# Patient Record
Sex: Female | Born: 1950 | Race: White | Hispanic: No | Marital: Married | Smoking: Former smoker
Health system: Southern US, Community
[De-identification: ages and names within clinical notes are randomized; demographics above are authoritative.]

## PROBLEM LIST (undated history)

## (undated) DIAGNOSIS — D239 Other benign neoplasm of skin, unspecified: Secondary | ICD-10-CM

## (undated) DIAGNOSIS — J189 Pneumonia, unspecified organism: Secondary | ICD-10-CM

## (undated) DIAGNOSIS — M8580 Other specified disorders of bone density and structure, unspecified site: Secondary | ICD-10-CM

## (undated) DIAGNOSIS — Z923 Personal history of irradiation: Secondary | ICD-10-CM

## (undated) DIAGNOSIS — D649 Anemia, unspecified: Secondary | ICD-10-CM

## (undated) DIAGNOSIS — R3129 Other microscopic hematuria: Secondary | ICD-10-CM

## (undated) DIAGNOSIS — E785 Hyperlipidemia, unspecified: Secondary | ICD-10-CM

## (undated) DIAGNOSIS — C50919 Malignant neoplasm of unspecified site of unspecified female breast: Secondary | ICD-10-CM

## (undated) DIAGNOSIS — D219 Benign neoplasm of connective and other soft tissue, unspecified: Secondary | ICD-10-CM

## (undated) DIAGNOSIS — G709 Myoneural disorder, unspecified: Secondary | ICD-10-CM

## (undated) DIAGNOSIS — H35341 Macular cyst, hole, or pseudohole, right eye: Secondary | ICD-10-CM

## (undated) DIAGNOSIS — K219 Gastro-esophageal reflux disease without esophagitis: Secondary | ICD-10-CM

## (undated) DIAGNOSIS — C801 Malignant (primary) neoplasm, unspecified: Secondary | ICD-10-CM

## (undated) DIAGNOSIS — R7303 Prediabetes: Secondary | ICD-10-CM

## (undated) DIAGNOSIS — H33001 Unspecified retinal detachment with retinal break, right eye: Secondary | ICD-10-CM

## (undated) DIAGNOSIS — I10 Essential (primary) hypertension: Secondary | ICD-10-CM

## (undated) DIAGNOSIS — Z9221 Personal history of antineoplastic chemotherapy: Secondary | ICD-10-CM

## (undated) DIAGNOSIS — IMO0001 Reserved for inherently not codable concepts without codable children: Secondary | ICD-10-CM

## (undated) DIAGNOSIS — IMO0002 Reserved for concepts with insufficient information to code with codable children: Secondary | ICD-10-CM

## (undated) HISTORY — DX: Other benign neoplasm of skin, unspecified: D23.9

## (undated) HISTORY — DX: Other specified disorders of bone density and structure, unspecified site: M85.80

## (undated) HISTORY — DX: Prediabetes: R73.03

## (undated) HISTORY — DX: Reserved for inherently not codable concepts without codable children: IMO0001

## (undated) HISTORY — PX: BREAST SURGERY: SHX581

## (undated) HISTORY — DX: Malignant (primary) neoplasm, unspecified: C80.1

## (undated) HISTORY — DX: Other microscopic hematuria: R31.29

## (undated) HISTORY — DX: Reserved for concepts with insufficient information to code with codable children: IMO0002

## (undated) HISTORY — PX: TONSILLECTOMY: SUR1361

## (undated) HISTORY — DX: Essential (primary) hypertension: I10

## (undated) HISTORY — PX: CATARACT EXTRACTION: SUR2

## (undated) HISTORY — DX: Malignant neoplasm of unspecified site of unspecified female breast: C50.919

## (undated) HISTORY — DX: Benign neoplasm of connective and other soft tissue, unspecified: D21.9

## (undated) HISTORY — DX: Hyperlipidemia, unspecified: E78.5

## (undated) HISTORY — PX: LIPOMA EXCISION: SHX5283

## (undated) HISTORY — PX: COLONOSCOPY: SHX174

---

## 1998-10-14 ENCOUNTER — Ambulatory Visit (HOSPITAL_COMMUNITY): Admission: RE | Admit: 1998-10-14 | Discharge: 1998-10-14 | Payer: Self-pay | Admitting: Obstetrics and Gynecology

## 1998-10-14 ENCOUNTER — Encounter: Payer: Self-pay | Admitting: Obstetrics and Gynecology

## 1998-11-09 ENCOUNTER — Other Ambulatory Visit: Admission: RE | Admit: 1998-11-09 | Discharge: 1998-11-09 | Payer: Self-pay | Admitting: Obstetrics and Gynecology

## 1999-11-12 ENCOUNTER — Ambulatory Visit (HOSPITAL_COMMUNITY): Admission: RE | Admit: 1999-11-12 | Discharge: 1999-11-12 | Payer: Self-pay | Admitting: Obstetrics and Gynecology

## 1999-11-12 ENCOUNTER — Encounter: Payer: Self-pay | Admitting: Obstetrics and Gynecology

## 1999-12-01 ENCOUNTER — Other Ambulatory Visit: Admission: RE | Admit: 1999-12-01 | Discharge: 1999-12-01 | Payer: Self-pay | Admitting: Obstetrics and Gynecology

## 2000-11-13 ENCOUNTER — Encounter: Payer: Self-pay | Admitting: Obstetrics and Gynecology

## 2000-11-13 ENCOUNTER — Ambulatory Visit (HOSPITAL_COMMUNITY): Admission: RE | Admit: 2000-11-13 | Discharge: 2000-11-13 | Payer: Self-pay | Admitting: Obstetrics and Gynecology

## 2001-01-10 ENCOUNTER — Other Ambulatory Visit: Admission: RE | Admit: 2001-01-10 | Discharge: 2001-01-10 | Payer: Self-pay | Admitting: Obstetrics and Gynecology

## 2001-11-16 ENCOUNTER — Ambulatory Visit (HOSPITAL_COMMUNITY): Admission: RE | Admit: 2001-11-16 | Discharge: 2001-11-16 | Payer: Self-pay | Admitting: Obstetrics and Gynecology

## 2001-11-16 ENCOUNTER — Encounter: Payer: Self-pay | Admitting: Obstetrics and Gynecology

## 2002-02-05 ENCOUNTER — Other Ambulatory Visit: Admission: RE | Admit: 2002-02-05 | Discharge: 2002-02-05 | Payer: Self-pay | Admitting: Obstetrics and Gynecology

## 2002-02-13 ENCOUNTER — Encounter: Payer: Self-pay | Admitting: Obstetrics and Gynecology

## 2002-02-13 ENCOUNTER — Encounter: Admission: RE | Admit: 2002-02-13 | Discharge: 2002-02-13 | Payer: Self-pay | Admitting: Obstetrics and Gynecology

## 2002-04-16 ENCOUNTER — Encounter: Payer: Self-pay | Admitting: Obstetrics and Gynecology

## 2002-04-16 ENCOUNTER — Encounter: Admission: RE | Admit: 2002-04-16 | Discharge: 2002-04-16 | Payer: Self-pay | Admitting: Obstetrics and Gynecology

## 2002-12-03 ENCOUNTER — Ambulatory Visit (HOSPITAL_COMMUNITY): Admission: RE | Admit: 2002-12-03 | Discharge: 2002-12-03 | Payer: Self-pay | Admitting: Obstetrics and Gynecology

## 2002-12-03 ENCOUNTER — Encounter: Payer: Self-pay | Admitting: Obstetrics and Gynecology

## 2003-02-25 ENCOUNTER — Other Ambulatory Visit: Admission: RE | Admit: 2003-02-25 | Discharge: 2003-02-25 | Payer: Self-pay | Admitting: Obstetrics and Gynecology

## 2003-09-19 ENCOUNTER — Encounter: Payer: Self-pay | Admitting: Obstetrics and Gynecology

## 2003-09-19 ENCOUNTER — Encounter: Admission: RE | Admit: 2003-09-19 | Discharge: 2003-09-19 | Payer: Self-pay | Admitting: Obstetrics and Gynecology

## 2004-04-14 ENCOUNTER — Other Ambulatory Visit: Admission: RE | Admit: 2004-04-14 | Discharge: 2004-04-14 | Payer: Self-pay | Admitting: Obstetrics and Gynecology

## 2004-05-18 ENCOUNTER — Encounter: Admission: RE | Admit: 2004-05-18 | Discharge: 2004-05-18 | Payer: Self-pay | Admitting: Obstetrics and Gynecology

## 2005-06-01 ENCOUNTER — Other Ambulatory Visit: Admission: RE | Admit: 2005-06-01 | Discharge: 2005-06-01 | Payer: Self-pay | Admitting: Obstetrics and Gynecology

## 2005-06-24 ENCOUNTER — Encounter: Admission: RE | Admit: 2005-06-24 | Discharge: 2005-06-24 | Payer: Self-pay | Admitting: Obstetrics and Gynecology

## 2005-11-28 DIAGNOSIS — D219 Benign neoplasm of connective and other soft tissue, unspecified: Secondary | ICD-10-CM

## 2005-11-28 HISTORY — DX: Benign neoplasm of connective and other soft tissue, unspecified: D21.9

## 2006-04-04 ENCOUNTER — Ambulatory Visit: Payer: Self-pay | Admitting: Psychology

## 2006-04-07 ENCOUNTER — Other Ambulatory Visit: Admission: RE | Admit: 2006-04-07 | Discharge: 2006-04-07 | Payer: Self-pay | Admitting: Emergency Medicine

## 2006-04-17 ENCOUNTER — Ambulatory Visit: Payer: Self-pay | Admitting: Psychology

## 2006-05-01 ENCOUNTER — Ambulatory Visit: Payer: Self-pay | Admitting: Psychology

## 2006-05-22 ENCOUNTER — Ambulatory Visit: Payer: Self-pay | Admitting: Psychology

## 2006-06-05 ENCOUNTER — Ambulatory Visit: Payer: Self-pay | Admitting: Psychology

## 2006-07-03 ENCOUNTER — Ambulatory Visit: Payer: Self-pay | Admitting: Psychology

## 2006-07-11 ENCOUNTER — Encounter: Admission: RE | Admit: 2006-07-11 | Discharge: 2006-07-11 | Payer: Self-pay | Admitting: Obstetrics and Gynecology

## 2006-07-17 ENCOUNTER — Ambulatory Visit: Payer: Self-pay | Admitting: Psychology

## 2006-08-14 ENCOUNTER — Ambulatory Visit: Payer: Self-pay | Admitting: Psychology

## 2006-08-28 ENCOUNTER — Ambulatory Visit: Payer: Self-pay | Admitting: Psychology

## 2006-08-30 ENCOUNTER — Other Ambulatory Visit: Admission: RE | Admit: 2006-08-30 | Discharge: 2006-08-30 | Payer: Self-pay | Admitting: Obstetrics and Gynecology

## 2006-09-25 ENCOUNTER — Ambulatory Visit: Payer: Self-pay | Admitting: Psychology

## 2007-07-17 ENCOUNTER — Encounter: Admission: RE | Admit: 2007-07-17 | Discharge: 2007-07-17 | Payer: Self-pay | Admitting: Obstetrics and Gynecology

## 2007-09-04 ENCOUNTER — Other Ambulatory Visit: Admission: RE | Admit: 2007-09-04 | Discharge: 2007-09-04 | Payer: Self-pay | Admitting: Obstetrics and Gynecology

## 2008-07-21 ENCOUNTER — Encounter: Admission: RE | Admit: 2008-07-21 | Discharge: 2008-07-21 | Payer: Self-pay | Admitting: Obstetrics and Gynecology

## 2008-07-25 ENCOUNTER — Encounter: Admission: RE | Admit: 2008-07-25 | Discharge: 2008-07-25 | Payer: Self-pay | Admitting: Obstetrics and Gynecology

## 2008-09-05 ENCOUNTER — Other Ambulatory Visit: Admission: RE | Admit: 2008-09-05 | Discharge: 2008-09-05 | Payer: Self-pay | Admitting: Obstetrics and Gynecology

## 2009-01-20 ENCOUNTER — Encounter: Admission: RE | Admit: 2009-01-20 | Discharge: 2009-01-20 | Payer: Self-pay | Admitting: Obstetrics and Gynecology

## 2009-02-17 ENCOUNTER — Encounter: Admission: RE | Admit: 2009-02-17 | Discharge: 2009-02-17 | Payer: Self-pay | Admitting: Family Medicine

## 2009-07-22 ENCOUNTER — Encounter: Admission: RE | Admit: 2009-07-22 | Discharge: 2009-07-22 | Payer: Self-pay | Admitting: Obstetrics and Gynecology

## 2009-08-13 ENCOUNTER — Encounter: Admission: RE | Admit: 2009-08-13 | Discharge: 2009-08-13 | Payer: Self-pay | Admitting: Family Medicine

## 2009-09-08 ENCOUNTER — Other Ambulatory Visit: Admission: RE | Admit: 2009-09-08 | Discharge: 2009-09-08 | Payer: Self-pay | Admitting: Obstetrics and Gynecology

## 2009-11-28 DIAGNOSIS — D239 Other benign neoplasm of skin, unspecified: Secondary | ICD-10-CM

## 2009-11-28 HISTORY — PX: ARM SKIN LESION BIOPSY / EXCISION: SUR471

## 2009-11-28 HISTORY — DX: Other benign neoplasm of skin, unspecified: D23.9

## 2010-08-23 ENCOUNTER — Ambulatory Visit (HOSPITAL_COMMUNITY): Admission: RE | Admit: 2010-08-23 | Discharge: 2010-08-23 | Payer: Self-pay | Admitting: Orthopedic Surgery

## 2010-10-05 ENCOUNTER — Encounter: Admission: RE | Admit: 2010-10-05 | Discharge: 2010-10-05 | Payer: Self-pay | Admitting: Obstetrics and Gynecology

## 2011-02-10 LAB — CREATININE, SERUM: GFR calc Af Amer: >60 mL/min (ref >60)

## 2011-02-10 LAB — BUN: BUN: 15 mg/dL (ref 6–23)

## 2011-08-26 ENCOUNTER — Ambulatory Visit (INDEPENDENT_AMBULATORY_CARE_PROVIDER_SITE_OTHER): Payer: 59 | Admitting: Psychology

## 2011-08-26 DIAGNOSIS — F4322 Adjustment disorder with anxiety: Secondary | ICD-10-CM

## 2011-09-27 ENCOUNTER — Other Ambulatory Visit: Payer: Self-pay | Admitting: Obstetrics and Gynecology

## 2011-09-27 DIAGNOSIS — Z1231 Encounter for screening mammogram for malignant neoplasm of breast: Secondary | ICD-10-CM

## 2011-11-02 ENCOUNTER — Ambulatory Visit
Admission: RE | Admit: 2011-11-02 | Discharge: 2011-11-02 | Disposition: A | Discharge status: Home / Self Care | Payer: 59 | Source: Ambulatory Visit | Attending: Obstetrics and Gynecology | Admitting: Obstetrics and Gynecology

## 2011-11-02 DIAGNOSIS — Z1231 Encounter for screening mammogram for malignant neoplasm of breast: Secondary | ICD-10-CM

## 2012-10-29 ENCOUNTER — Other Ambulatory Visit: Payer: Self-pay | Admitting: Obstetrics and Gynecology

## 2012-10-29 DIAGNOSIS — Z1231 Encounter for screening mammogram for malignant neoplasm of breast: Secondary | ICD-10-CM

## 2012-11-09 ENCOUNTER — Other Ambulatory Visit: Payer: Self-pay | Admitting: Obstetrics and Gynecology

## 2012-11-09 DIAGNOSIS — N6310 Unspecified lump in the right breast, unspecified quadrant: Secondary | ICD-10-CM

## 2012-11-15 ENCOUNTER — Other Ambulatory Visit: Payer: Self-pay | Admitting: Radiology

## 2012-11-15 DIAGNOSIS — C50919 Malignant neoplasm of unspecified site of unspecified female breast: Secondary | ICD-10-CM

## 2012-11-15 DIAGNOSIS — C50911 Malignant neoplasm of unspecified site of right female breast: Secondary | ICD-10-CM | POA: Insufficient documentation

## 2012-11-15 HISTORY — DX: Malignant neoplasm of unspecified site of unspecified female breast: C50.919

## 2012-11-16 ENCOUNTER — Other Ambulatory Visit: Payer: Self-pay | Admitting: Radiology

## 2012-11-16 DIAGNOSIS — C50911 Malignant neoplasm of unspecified site of right female breast: Secondary | ICD-10-CM

## 2012-11-20 ENCOUNTER — Other Ambulatory Visit: Payer: 59

## 2012-11-22 ENCOUNTER — Ambulatory Visit
Admission: RE | Admit: 2012-11-22 | Discharge: 2012-11-22 | Disposition: A | Discharge status: Home / Self Care | Payer: 59 | Source: Ambulatory Visit | Attending: Radiology | Admitting: Radiology

## 2012-11-22 DIAGNOSIS — C50911 Malignant neoplasm of unspecified site of right female breast: Secondary | ICD-10-CM

## 2012-11-22 MED ORDER — GADOBENATE DIMEGLUMINE 529 MG/ML IV SOLN
15.0000 mL | Freq: Once | INTRAVENOUS | Status: AC | PRN
  Administered 2012-11-22: 15 mL via INTRAVENOUS

## 2012-11-28 HISTORY — PX: BREAST LUMPECTOMY: SHX2

## 2012-11-30 ENCOUNTER — Encounter (INDEPENDENT_AMBULATORY_CARE_PROVIDER_SITE_OTHER): Payer: Self-pay | Admitting: Surgery

## 2012-11-30 ENCOUNTER — Ambulatory Visit (INDEPENDENT_AMBULATORY_CARE_PROVIDER_SITE_OTHER): Payer: 59 | Admitting: Surgery

## 2012-11-30 VITALS — BP 136/80 | HR 74 | Temp 97.9°F | Resp 18 | Ht 64.0 in | Wt 168.2 lb

## 2012-11-30 DIAGNOSIS — C50911 Malignant neoplasm of unspecified site of right female breast: Secondary | ICD-10-CM

## 2012-11-30 DIAGNOSIS — C50919 Malignant neoplasm of unspecified site of unspecified female breast: Secondary | ICD-10-CM

## 2012-11-30 NOTE — Progress Notes (Signed)
Patient ID: Anita Randolph, female   DOB: 10/06/1951, 61 y.o.   MRN: [id number]  Chief Complaint  Patient presents with  . Breast Cancer    HPI Anita Randolph is a 61 y.o. female.  He recently found a right breast mass. 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 The MRI shows this is a single lesion. However it appears to be involving the pectoralis major. It is not otherwise symptomatic. Her mother had premenopausal breast cancer but is a 50+ year survivor HPI  Past Medical History  Diagnosis Date  . Cancer     breast  . Hypertension     History reviewed. No pertinent past surgical history.  Family History  Problem Relation Age of Onset  . Cancer Mother     breast  . Heart disease Father   . Cancer Father     bladder  . Cancer Maternal Aunt     breast    Social History History  Substance Use Topics  . Smoking status: Former Smoker    Quit date: 11/30/1964  . Smokeless tobacco: Never Used  . Alcohol Use: Yes     Comment: glass of wine on weekend    No Known Allergies  Current Outpatient Prescriptions  Medication Sig Dispense Refill  . simvastatin (ZOCOR) 40 MG tablet Daily.      . Vitamin D, Ergocalciferol, (DRISDOL) 50000 UNITS CAPS Once a week.        Review of Systems Review of Systems  Constitutional: Negative for fever, chills and unexpected weight change.  HENT: Negative for hearing loss, congestion, sore throat, trouble swallowing and voice change.   Eyes: Negative for visual disturbance.  Respiratory: Negative for cough and wheezing.   Cardiovascular: Negative for chest pain, palpitations and leg swelling.  Gastrointestinal: Negative for nausea, vomiting, abdominal pain, diarrhea, constipation, blood in stool, abdominal distention and anal bleeding.  Genitourinary: Negative for hematuria, vaginal bleeding and difficulty urinating.  Musculoskeletal: Negative  for arthralgias.  Skin: Negative for rash and wound.  Neurological: Negative for seizures, syncope and headaches.  Hematological: Negative for adenopathy. Does not bruise/bleed easily.  Psychiatric/Behavioral: Negative for confusion.    Blood pressure 136/80, pulse 74, temperature 97.9 F (36.6 C), temperature source Temporal, resp. rate 18, height 5' 4" (1.626 m), weight 168 lb 4 oz (76.318 kg).  Physical Exam Physical Exam  Vitals reviewed. Constitutional: She is oriented to person, place, and time. She appears well-developed and well-nourished. No distress.  HENT:  Head: Normocephalic and atraumatic.  Mouth/Throat: Oropharynx is clear and moist.  Eyes: Conjunctivae normal and EOM are normal. Pupils are equal, round, and reactive to light. No scleral icterus.  Neck: Normal range of motion. Neck supple. No tracheal deviation present. No thyromegaly present.  Cardiovascular: Normal rate, regular rhythm, normal heart sounds and intact distal pulses.  Exam reveals no gallop and no friction rub.   No murmur heard. Pulmonary/Chest: Effort normal and breath sounds normal. No respiratory distress. She has no wheezes. She has no rales. Right breast exhibits mass.         Hard 3.5 cm very medially located right breast mas, fixed deep  Abdominal: Soft. Bowel sounds are normal. She exhibits no distension and no mass. There is no tenderness. There is no rebound and no guarding.  Musculoskeletal: Normal range of motion. She exhibits no edema and no tenderness.  Lymphadenopathy:    She has no   cervical adenopathy.    She has no axillary adenopathy.       Right: No supraclavicular adenopathy present.       Left: No supraclavicular adenopathy present.  Neurological: She is alert and oriented to person, place, and time.  Skin: Skin is warm and dry. No rash noted. She is not diaphoretic. No erythema.  Psychiatric: She has a normal mood and affect. Her behavior is normal. Judgment and thought content  normal.    Data Reviewed MRI: IMPRESSION:  Irregular enhancing far posterior right breast mass 5 o'clock  location, with no discernible fat plane between it and the  underlying pectoralis major muscle, and minimal irregular  enhancement involving the superficial aspect of the pectoralis  muscle suggesting tumor involvement. No evidence for underlying  chest wall involvement or internal mammary artery chain or axillary  lymphadenopathy.  No other area of abnormal enhancement in either breast to suggest  multifocal/multicentric or contralateral malignancy.  RECOMMENDATION:  Treatment plan  Path: ADDITIONAL INFORMATION: PROGNOSTIC INDICATORS - ACIS Results IMMUNOHISTOCHEMICAL AND MORPHOMETRIC ANALYSIS BY THE AUTOMATED CELLULAR IMAGING SYSTEM (ACIS) Estrogen Receptor (Negative, <1%): 29%, STRONG STAINING INTENSITY Progesterone Receptor (Negative, <1%): 0%, NEGATIVE Proliferation Marker Ki67 by M IB-1 (Low<20%): 79% COMMENT: The negative hormone receptor study in this case has no internal positive control. All controls stained appropriately JOSHUA KISH MD Pathologist, Electronic Signature ( Signed 11/23/2012) CHROMOGENIC IN-SITU HYBRIDIZATION Interpretation: HER2/NEU BY CISH - SHOWS AMPLIFICATION BY CISH ANALYSIS. THE RATIO OF HER2: CEP 17 SIGNALS WAS 4.39 Reference range: Ratio: HER2:CEP17 < 1.8 gene amplification not observed Ratio: HER2:CEP 17 1.8-2.2 - equivocal result Ratio: HER2:CEP17 > 2.2 - gene amplification observed JOSHUA KISH MD Pathologist, Electronic Signature ( Signed 11/22/2012) 1 of 2 Duplicate copy FINAL for Mroczka, Danicka G ([id number]) FINAL DIAGNOSIS Diagnosis Breast, right, needle core biopsy - INVASIVE DUCTAL CARCINOMA. - SEE COMMENT. Microscopic Comment The carcinoma is grade III. A breast prognostic profile will be performed and the results reported separately. The results were called to [facility] on 11/16/2012. 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 We discussed the multidisciplinary approach to breast cancer which often includes both medical and radiation oncology consultations.  We also discussed surgical options for the treatment of breast cancer including lumpectomy and mastectomy with possible reconstructive surgery. In addition we talked about the evaluation and management of lymph nodes including a description of sentinel lymph node biopsy and axillary dissections. We reviewed potential complications and risks including bleeding, infection, numbness,  lymphedema, and the potential need for additional surgery.  She understands that for patients who are candidate for lumpectomy or mastectomy there is an equal survival rate with either technique, but a slightly higher local recurrence rate with lumpectomy. In addition she knows that a lumpectomy usually requires postoperative radiation as part of the management of the breast cancer.  We have discussed the likely postoperative course and plans for followup.  I have given the patient some written information that reviewed all of these issues. I believe her questions are answered and that she has a good understanding of the issues.  She needs genetic counselor and med onc appointments. I think she will need neoadjuvant chemo, and discussed port placement. She knows Dr Magrinat and would like to see him.         Lauri Purdum J 11/30/2012, 9:28 AM    

## 2012-11-30 NOTE — Patient Instructions (Signed)
We will arrange a consultation with a medical oncologist (Dr Darnelle Catalan) and for genetic counseling

## 2012-12-04 ENCOUNTER — Other Ambulatory Visit: Payer: Self-pay | Admitting: *Deleted

## 2012-12-04 ENCOUNTER — Encounter: Payer: Self-pay | Admitting: *Deleted

## 2012-12-04 ENCOUNTER — Ambulatory Visit: Payer: 59

## 2012-12-04 DIAGNOSIS — C50919 Malignant neoplasm of unspecified site of unspecified female breast: Secondary | ICD-10-CM

## 2012-12-05 ENCOUNTER — Telehealth: Payer: Self-pay | Admitting: *Deleted

## 2012-12-05 ENCOUNTER — Telehealth: Payer: Self-pay | Admitting: Oncology

## 2012-12-05 NOTE — Telephone Encounter (Signed)
Cindy from echo lb called and stated pt does not need auth for echo and she can get her in @ 9am 1/9. Cindy aware I will call her back tomorrow morning if pt cannot come. S/w pt and she has already rearranged some things to be @ [facility] 1/9 @ 11:15 am for pet/ct and she cannot work echo in @ 9am. Pt made aware I will s/w echo lb again tomorrow morning and r/s echo. Per pt best number to reach her at is cell. Per pt will try to get echo Friday after appts w/GM and genetics.

## 2012-12-05 NOTE — Telephone Encounter (Signed)
Called pt about her genetic appt and gave her this Friday at 11am, but will try to coordinate w/ Clydie Braun after she sees Dr. Darnelle Catalan.  Emailed Clydie Braun to see if she can see her earlier.

## 2012-12-06 ENCOUNTER — Ambulatory Visit (HOSPITAL_COMMUNITY): Payer: 59

## 2012-12-06 ENCOUNTER — Encounter (HOSPITAL_COMMUNITY)
Admission: RE | Admit: 2012-12-06 | Discharge: 2012-12-06 | Disposition: A | Discharge status: Home / Self Care | Payer: 59 | Source: Ambulatory Visit | Attending: Oncology | Admitting: Oncology

## 2012-12-06 ENCOUNTER — Encounter (HOSPITAL_COMMUNITY): Payer: Self-pay

## 2012-12-06 ENCOUNTER — Ambulatory Visit (HOSPITAL_COMMUNITY)
Admission: RE | Admit: 2012-12-06 | Discharge: 2012-12-06 | Disposition: A | Discharge status: Home / Self Care | Payer: 59 | Source: Ambulatory Visit | Attending: Oncology | Admitting: Oncology

## 2012-12-06 ENCOUNTER — Telehealth: Payer: Self-pay | Admitting: Oncology

## 2012-12-06 DIAGNOSIS — C50919 Malignant neoplasm of unspecified site of unspecified female breast: Secondary | ICD-10-CM

## 2012-12-06 DIAGNOSIS — I10 Essential (primary) hypertension: Secondary | ICD-10-CM | POA: Insufficient documentation

## 2012-12-06 DIAGNOSIS — N63 Unspecified lump in unspecified breast: Secondary | ICD-10-CM | POA: Insufficient documentation

## 2012-12-06 MED ORDER — IOHEXOL 300 MG/ML  SOLN
100.0000 mL | Freq: Once | INTRAMUSCULAR | Status: AC | PRN
  Administered 2012-12-06: 80 mL via INTRAVENOUS

## 2012-12-06 MED ORDER — FLUDEOXYGLUCOSE F - 18 (FDG) INJECTION
19.8000 | Freq: Once | INTRAVENOUS | Status: AC | PRN
  Administered 2012-12-06: 19.8 via INTRAVENOUS

## 2012-12-06 NOTE — Telephone Encounter (Signed)
Echo appt r/s to 1/16 @ 10 am @ [facility] due to pt could not do echo 1/9. lmonvm for pt re appt d/t/location. Pt to get copy of echo appt when she comes in tomorrow.

## 2012-12-07 ENCOUNTER — Ambulatory Visit: Payer: 59

## 2012-12-07 ENCOUNTER — Telehealth: Payer: Self-pay | Admitting: Oncology

## 2012-12-07 ENCOUNTER — Other Ambulatory Visit (HOSPITAL_BASED_OUTPATIENT_CLINIC_OR_DEPARTMENT_OTHER): Payer: 59 | Admitting: Lab

## 2012-12-07 ENCOUNTER — Ambulatory Visit (HOSPITAL_BASED_OUTPATIENT_CLINIC_OR_DEPARTMENT_OTHER): Payer: 59 | Admitting: Oncology

## 2012-12-07 ENCOUNTER — Encounter: Payer: Self-pay | Admitting: Oncology

## 2012-12-07 ENCOUNTER — Encounter: Payer: Self-pay | Admitting: Genetic Counselor

## 2012-12-07 ENCOUNTER — Other Ambulatory Visit (INDEPENDENT_AMBULATORY_CARE_PROVIDER_SITE_OTHER): Payer: Self-pay | Admitting: Surgery

## 2012-12-07 ENCOUNTER — Other Ambulatory Visit: Payer: 59 | Admitting: Lab

## 2012-12-07 ENCOUNTER — Ambulatory Visit (HOSPITAL_BASED_OUTPATIENT_CLINIC_OR_DEPARTMENT_OTHER): Payer: 59 | Admitting: Genetic Counselor

## 2012-12-07 ENCOUNTER — Telehealth: Payer: Self-pay | Admitting: *Deleted

## 2012-12-07 ENCOUNTER — Ambulatory Visit (HOSPITAL_BASED_OUTPATIENT_CLINIC_OR_DEPARTMENT_OTHER): Payer: 59 | Admitting: Lab

## 2012-12-07 VITALS — BP 138/88 | HR 88 | Temp 97.7°F | Resp 20 | Ht 64.0 in | Wt 169.5 lb

## 2012-12-07 DIAGNOSIS — C50319 Malignant neoplasm of lower-inner quadrant of unspecified female breast: Secondary | ICD-10-CM

## 2012-12-07 DIAGNOSIS — C50911 Malignant neoplasm of unspecified site of right female breast: Secondary | ICD-10-CM

## 2012-12-07 DIAGNOSIS — Z803 Family history of malignant neoplasm of breast: Secondary | ICD-10-CM

## 2012-12-07 DIAGNOSIS — Z170 Estrogen receptor positive status [ER+]: Secondary | ICD-10-CM

## 2012-12-07 DIAGNOSIS — IMO0002 Reserved for concepts with insufficient information to code with codable children: Secondary | ICD-10-CM

## 2012-12-07 LAB — COMPREHENSIVE METABOLIC PANEL (CC13)
Albumin: 3.5 g/dL (ref 3.5–5.0)
Alkaline Phosphatase: 89 U/L (ref 40–150)
BUN: 17 mg/dL (ref 7.0–26.0)
Creatinine: 1.1 mg/dL (ref 0.6–1.1)
Glucose: 108 mg/dl — ABNORMAL HIGH (ref 70–99)
Potassium: 3.7 mEq/L (ref 3.5–5.1)

## 2012-12-07 LAB — CBC WITH DIFFERENTIAL/PLATELET
Basophils Absolute: 0 10*3/uL (ref 0.0–0.1)
EOS%: 1.7 % (ref 0.0–7.0)
HCT: 39.5 % (ref 34.8–46.6)
HGB: 13.4 g/dL (ref 11.6–15.9)
LYMPH%: 27.2 % (ref 14.0–49.7)
MCH: 28 pg (ref 25.1–34.0)
MCHC: 34.1 g/dL (ref 31.5–36.0)
MCV: 82.2 fL (ref 79.5–101.0)
MONO%: 5.1 % (ref 0.0–14.0)
NEUT%: 65.4 % (ref 38.4–76.8)
Platelets: 317 10*3/uL (ref 145–400)

## 2012-12-07 NOTE — Progress Notes (Signed)
Dr.  Raymond Gurney Magrinat requested a consultation for genetic counseling and risk assessment for Anita Randolph, a 62 y.o. female, for discussion of her personal history of breast cancer and family history of breast cancer. She presents to clinic today to discuss the possibility of a genetic predisposition to cancer, and to further clarify her risks, as well as her family members' risks for cancer.   HISTORY OF PRESENT ILLNESS: In 2013, at the age of 86, Anita Randolph was diagnosed with invasive ductal carcinoma.  This will be treated with chemotherapy and surgery.    Past Medical History  Diagnosis Date  . Cancer     breast  . Hypertension   . Breast cancer     History reviewed. No pertinent past surgical history.  History  Substance Use Topics  . Smoking status: Former Smoker -- 1.0 packs/day    Types: Cigarettes    Quit date: 11/30/1964  . Smokeless tobacco: Never Used  . Alcohol Use: Yes     Comment: glass of wine on weekend    REPRODUCTIVE HISTORY AND PERSONAL RISK ASSESSMENT FACTORS: Menarche was at age 65.   Menopausal Uterus Intact: Yes Ovaries Intact: Yes G2P2A0 , first live birth at age 58  She has not previously undergone treatment for infertility.   OCP use for 14 years   She has used HRT in the past.    FAMILY HISTORY:  We obtained a detailed, 4-generation family history.  Significant diagnoses are listed below: Family History  Problem Relation Age of Onset  . Breast cancer Mother 10  . Heart disease Father   . Bladder Cancer Father 26  . Breast cancer Maternal Aunt     diagnosed in her 37s  . Multiple sclerosis Paternal Aunt   . Heart disease Maternal Grandmother   . Mental retardation Cousin     maternal cousins; brothers, sons of aunt with breast cancer  The patient was diagnosed with breast cancer at age 63.  She has one brother who is healthy and cancer free.  Her mother was diagnosed with breast cancer at age 60 and is currently 68.  Her mother  has three paternal triplet sisters, all of whom are deceased, one died of breast cancer. The patient's father was a smoker and died of bladder cancer at age 94.  There is no other reported cancer history.  Patient's maternal ancestors are of [place] and [place] descent, and paternal ancestors are of [place] descent. There is reported Ashkenazi Jewish ancestry. There is no known consanguinity.  GENETIC COUNSELING RISK ASSESSMENT, DISCUSSION, AND SUGGESTED FOLLOW UP: We reviewed the natural history and genetic etiology of sporadic, familial and hereditary cancer syndromes.   About 5-10% of breast cancer is hereditary.  Of this, about 85% is the result of a BRCA1 or BRCA2 mutation. About 1 in 40 individuals of Ashkenazi Jewish descent is a carrier of a BRCA mutation.  We reviewed the red flags of hereditary cancer syndromes and the dominant inheritance patterns.  Because of the number of individuals and age of onset of the breast cancer in her family, we discussed panel testing that will look at multiple breast cancer genes all at the same time.  Because of her Jewish ancestry we will do multi-site testing first and reflex to a larger panel if negative.    The patient's personal and family history of breast cancer as well as Jewish ancestry is suggestive of the following possible diagnosis: hereditary cancer syndrome  We discussed that  identification of a hereditary cancer syndrome may help her care providers tailor the patients medical management. If a mutation indicating a hereditary cancer syndrome is detected in this case, the [employer] recommendations would include increased cancer surveillance and possible prophylactic surgery. If a mutation is detected, the patient will be referred back to the referring provider and to any additional appropriate care providers to discuss the relevant options.   If a mutation is not found in the patient, this will decrease the likelihood  of a hereditary cancer syndrome as the explanation for her breast cancer. Cancer surveillance options would be discussed for the patient according to the appropriate standard National Comprehensive Cancer Network and American Cancer Society guidelines, with consideration of their personal and family history risk factors. In this case, the patient will be referred back to their care providers for discussions of management.   In order to estimate her chance of having a BRCA mutation, we used statistical models (PENNII) and laboratory data that take into account her personal medical history, family history and ancestry.  Because each model is different, there can be a lot of variability in the risks they give.  Therefore, these numbers must be considered a rough range and not a precise risk of having a BRCA mutation.  These models estimate that she has approximately a 31% chance of having a mutation. Based on this assessment of her family and personal history, genetic testing is recommended.  After considering the risks, benefits, and limitations, the patient provided informed consent for  the following  testing: Multisite testing, reflexing to [facility] and [facility] through [employer] [facility].   Per the patient's request, we will contact her by telephone to discuss these results. A follow up genetic counseling visit will be scheduled if indicated.  The patient was seen for a total of 60 minutes, greater than 50% of which was spent face-to-face counseling.  This plan is being carried out per Dr. Ruthann Cancer recommendations.  This note will also be sent to the referring provider via the electronic medical record. The patient will be supplied with a summary of this genetic counseling discussion as well as educational information on the discussed hereditary cancer syndromes following the conclusion of their visit.   Patient was discussed with Dr. Drue Second.   _______________________________________________________________________ For Office Staff:  Number of people involved in session: 3 Was an Intern/ student involved with case: no

## 2012-12-07 NOTE — Telephone Encounter (Signed)
Per staff phone call and POF I have scheduled appts. JMW  

## 2012-12-07 NOTE — Progress Notes (Signed)
Checked in new pt with no financial concerns. °

## 2012-12-07 NOTE — Telephone Encounter (Signed)
appts made and printed for pt aom °

## 2012-12-07 NOTE — Progress Notes (Signed)
ID: Anita Randolph   DOB: 06-Jun-1951  MR#: [id number]  UXL#:[id number]  PCP: [place] GYN: [place]ita [place] SU: [place] OTHER MD: [place], [place], [place]   HISTORY OF PRESENT ILLNESS: [place] herself palpated a mass in her right breast 11/09/2012. She brought this to Dr. Harlene Randolph attention and mammography was obtained at [facility] (I do not have that report today) confirming a mass in the lower inner quadrant of the right breast. 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  Bilateral breast MRI 11/22/2012 showed a far posterior right breast mass measuring 3.2 cm with no discernible fat plane between the mass and the underlying pectoralis major. There was extension of irregular enhancement into the superficial aspect of the cholesterol is muscle underlying the mass, but there was no evidence for chest wall involvement, internal mammary artery chain or axillary lymph node involvement, or anything else in the breast or the contralateral breast.  The patient's subsequent history is as detailed below  INTERVAL HISTORY: [place] came today with her husband [place] to develop a definitive treatment plan for her newly diagnosed breast cancer   REVIEW OF SYSTEMS: Anita Randolph was having no symptoms related to this mass. She denies unusual headaches, visual changes, dizziness, gait imbalance, nausea, vomiting, or any change in bowel or bladder habits. His been no cough, phlegm production, pleurisy, chest pain or pressure, or palpitations. A detailed review of systems today was entirely negative.  PAST MEDICAL HISTORY: Past Medical History  Diagnosis Date  . Cancer     breast  . Hypertension     PAST SURGICAL HISTORY: No past surgical history on file.  FAMILY HISTORY Family History  Problem Relation  Age of Onset  . Cancer Mother     breast  . Heart disease Father   . Cancer Father     bladder  . Cancer Maternal Aunt     breast   the patient's father immigrated from [place], living in [place] for about 10 years. He had significant coronary artery disease, bladder cancer, and a ruptured aneurysm, but lived to be 91. The patient's mother is living at age 49. The patient has one brother, no sisters. The patient's mother had breast cancer diagnosed at age 59. The patient is of a Ashkenazi Jewish this sent. She will be meeting with our genetics counselor today  GYNECOLOGIC HISTORY: Menarche age 32, first live birth age 53, she is GX P2, menopause 2006, received hormone replacement until approximately 2010.  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 Her husband [place] has a management position for [place]. This involves a lot of traveling. Son Anita Randolph lives in [place] and is a sports Banker. Daughter Anita [place] lives in [place], where she is a Gaffer in international developments and is also getting an MPH in Development worker, international aid. The patient has no grandchildren.   ADVANCED DIRECTIVES: In place  HEALTH MAINTENANCE: History  Substance Use Topics  . Smoking status: Former Smoker    Quit date: 11/30/1964  . Smokeless tobacco: Never Used  . Alcohol Use: Yes     Comment: glass of wine on weekend   [place] exercises 3-4 times a week, doing weights, walking, and other structures exercises.   Colonoscopy: 2009/ [facility]  PAP: 2013  Bone density: 2010  Lipid  panel:  No Known Allergies  Current Outpatient Prescriptions  Medication Sig Dispense Refill  . simvastatin (ZOCOR) 40 MG tablet Daily.      . Vitamin D, Ergocalciferol, (DRISDOL) 50000 UNITS CAPS Once a week.        OBJECTIVE: Middle-aged white woman who appears well Filed Vitals:   12/07/12 0816  BP: 138/88  Pulse: 88  Temp: 97.7 F (36.5 C)  Resp: 20      Body mass index is 29.09 kg/(m^2).    ECOG FS: 0  Sclerae unicteric Oropharynx clear No cervical or supraclavicular adenopathy Lungs no rales or rhonchi Heart regular rate and rhythm Abd benign MSK no focal spinal tenderness, no peripheral edema Neuro: nonfocal Breasts: There is a palpable mass in the lower inner aspect of the right breast, and it appears fixed. I do not see overlying skin involvement, or nipple retraction. The rest of the right breast is unremarkable and the right axilla is clear. The left breast is benign.   LAB RESULTS: No results found for this basename: WBC,  NEUTROABS,  HGB,  HCT,  MCV,  PLT      Chemistry      Component Value Date/Time   BUN 15 08/23/2010 1350   CREATININE 1.00 08/23/2010 1350   No results found for this basename: CALCIUM,  ALKPHOS,  AST,  ALT,  BILITOT       No results found for this basename: LABCA2    No components found with this basename: LABCA125    No results found for this basename: INR:1;PROTIME:1 in the last 168 hours  Urinalysis No results found for this basename: colorurine,  appearanceur,  labspec,  phurine,  glucoseu,  hgbur,  bilirubinur,  ketonesur,  proteinur,  urobilinogen,  nitrite,  leukocytesur    STUDIES: Ct Chest W Contrast  12/06/2012  *RADIOLOGY REPORT*  Clinical Data: Right breast cancer diagnosed December 2013, history hypertension  CT CHEST WITH CONTRAST  Technique:  Multidetector CT imaging of the chest was performed following the standard protocol during bolus administration of intravenous contrast. Sagittal and coronal MPR images reconstructed from axial data set.  Contrast: 80mL OMNIPAQUE IOHEXOL 300 MG/ML  SOLN  Comparison: None  Findings: Large soft tissue mass identified in the medial right breast, 3.6 x 3.0 x 3.5 cm, likely representing the patient's known right breast malignancy. Minimal surrounding infiltrative change. Normal-sized axillary nodes bilaterally. No retro pectoral or intrathoracic  adenopathy identified. Visualized portion of upper abdomen normal appearance.  Lungs clear. No infiltrate, pleural effusion or mass/nodule. Degenerative disc disease changes T10-T11. Minimal superior endplate height loss at T8 appears old. No additional bony lesions identified.  IMPRESSION: 3.6 x 3.0 x 3.5 cm diameter medial right breast mass likely representing the patient's known right breast malignancy. No evidence of intrathoracic metastatic disease or tumor spread.   Original Report Authenticated By: Ulyses Southward, M.D.    Mr Breast Bilateral W Wo Contrast  11/22/2012  *RADIOLOGY REPORT*  Clinical Data: Newly-diagnosed right breast five o'clock location invasive ductal carcinoma manifesting as a newly palpable mass and new mammographic finding.  BILATERAL BREAST MRI WITH AND WITHOUT CONTRAST  Technique: Multiplanar, multisequence MR images of both breasts were obtained prior to and following the intravenous administration of 15ml of Multihance.  Three dimensional images were evaluated at the independent DynaCad workstation.  Comparison:  [facility] mammograms 11/15/2012 and prior [facility] mammograms 2012 and earlier  Findings: In the right breast far posterior five o'clock location, there is a 3.2 x  3.1 x 3.1 cm mildly irregular mass with central post biopsy change, corresponding to the biopsy-proven breast cancer.  On T2-weighted images, there is no discernible fat plane between the mass and the underlying pectoralis major muscle.  On postcontrast images, several enhancing vessels extending from the the mass through the pectoralis muscle are identified, likely related to tumoral neovascularity.  There is also extension of irregular enhancement into the superficial aspect of the pectoralis muscle underlying the mass.  Background enhancement type parenchymal pattern is minimal.  No internal mammary artery chain or axillary lymphadenopathy is identified.  No abnormal T2-weighted hyperintensity  in either breast.  No other area of abnormal enhancement is seen on either side.  IMPRESSION: Irregular enhancing far posterior right breast mass 5 o'clock location, with no discernible fat plane between it and the underlying pectoralis major muscle, and minimal irregular enhancement involving the superficial aspect of the pectoralis muscle suggesting tumor involvement.  No evidence for underlying chest wall involvement or internal mammary artery chain or axillary lymphadenopathy.  No other area of abnormal enhancement in either breast to suggest multifocal/multicentric or contralateral malignancy.  RECOMMENDATION: Treatment plan  THREE-DIMENSIONAL MR IMAGE RENDERING ON INDEPENDENT WORKSTATION:  Three-dimensional MR images were rendered by post-processing of the original MR data on an independent workstation.  The three- dimensional MR images were interpreted, and findings were reported in the accompanying complete MRI report for this study.  BI-RADS CATEGORY 6:  Known biopsy-proven malignancy - appropriate action should be taken.   Original Report Authenticated By: Christiana Pellant, M.D.    Nm Pet Image Initial (pi) Skull Base To Thigh  12/06/2012  *RADIOLOGY REPORT*  Clinical Data: Initial treatment strategy for breast cancer. Staging.  NUCLEAR MEDICINE PET SKULL BASE TO THIGH  Fasting Blood Glucose:  93  Technique:  19.8 mCi F-18 FDG was injected intravenously. CT data was obtained and used for attenuation correction and anatomic localization only.  (This was not acquired as a diagnostic CT examination.) Additional exam technical data entered on technologist worksheet.  Comparison:  CT chest 12/06/2012 and breast MR 11/22/2012.  Findings:  Neck: No hypermetabolic lymph nodes in the neck.  CT images show no acute findings.  Chest:  Medial right breast mass measures 3.0 x 3.7 cm and is intensely hypermetabolic, with an S U V max of 23.2.  No additional areas of abnormal hypermetabolism.  Axillary lymph nodes are  sub centimeter in size.  CT images show no acute findings. Specifically, no pericardial or pleural effusion.  No worrisome pulmonary nodules.  Abdomen/Pelvis:  No abnormal hypermetabolism in the abdomen or pelvis.  No hypermetabolic lymph nodes.  CT images show no acute findings.  No free fluid.  Skeleton:  No focal hypermetabolic activity to suggest skeletal metastasis.  IMPRESSION: Intensely hypermetabolic primary right breast mass without evidence of metastatic disease.   Original Report Authenticated By: Leanna Battles, M.D.     ASSESSMENT: 62 y.o. Bartholomew woman s/p right breast biopsy 11/15/2012 for a clinical T2 N0, stage IIA invasive ductal carcinoma, grade 3, with superficial involvement of the pectoralis muscle, estrogen receptor 29% positive, progesterone receptor negative, with an Mib-1 of 79% and HER-2 amplification by CISH with a ratio of 4.39  PLAN: We spent the better part of her hour-long visit discussing the biology of breast cancer in her own situation in particular. She will benefit from standard treatment with carboplatin, Taxol, and trastuzumab. We will obtain an echocardiogram before the start of treatment. She will also need a port. In addition,  there was no clip placed at the time of biopsy, and in case we have a complete pathologic remission, we will go ahead and have a clip placed in the tumor at the time of port placement.  She will benefit from coming to our "chemotherapy school", and will also meet with our genetics counselor today. Target start chemotherapy date is January 23   Sherisa Gilvin C    12/07/2012

## 2012-12-13 ENCOUNTER — Telehealth: Payer: Self-pay | Admitting: *Deleted

## 2012-12-13 ENCOUNTER — Other Ambulatory Visit: Payer: Self-pay | Admitting: *Deleted

## 2012-12-13 ENCOUNTER — Telehealth: Payer: Self-pay | Admitting: Genetic Counselor

## 2012-12-13 ENCOUNTER — Encounter (HOSPITAL_COMMUNITY): Payer: Self-pay

## 2012-12-13 ENCOUNTER — Ambulatory Visit (HOSPITAL_COMMUNITY)
Admission: RE | Admit: 2012-12-13 | Discharge: 2012-12-13 | Disposition: A | Discharge status: Home / Self Care | Payer: 59 | Source: Ambulatory Visit | Attending: Oncology | Admitting: Oncology

## 2012-12-13 ENCOUNTER — Encounter (HOSPITAL_COMMUNITY)
Admission: RE | Admit: 2012-12-13 | Discharge: 2012-12-13 | Disposition: A | Discharge status: Home / Self Care | Payer: 59 | Source: Ambulatory Visit | Attending: Surgery | Admitting: Surgery

## 2012-12-13 DIAGNOSIS — C50919 Malignant neoplasm of unspecified site of unspecified female breast: Secondary | ICD-10-CM

## 2012-12-13 DIAGNOSIS — Z09 Encounter for follow-up examination after completed treatment for conditions other than malignant neoplasm: Secondary | ICD-10-CM

## 2012-12-13 DIAGNOSIS — Z01818 Encounter for other preprocedural examination: Secondary | ICD-10-CM | POA: Insufficient documentation

## 2012-12-13 LAB — SURGICAL PCR SCREEN: MRSA, PCR: NEGATIVE

## 2012-12-13 MED ORDER — CHLORHEXIDINE GLUCONATE 4 % EX LIQD: 1.0000 "application " | Freq: Once | CUTANEOUS | Status: DC

## 2012-12-13 NOTE — Progress Notes (Signed)
Echocardiogram 2D Echocardiogram has been performed.  [place] 12/13/2012, 11:07 AM

## 2012-12-13 NOTE — Telephone Encounter (Signed)
Per patient voicemail message, she needs someone to call her back. In her message she stated that she has her port placement on 1/23. Patient also to start her first chemo treatment. Patient wants to push her treatment back one week. I have forwarded the message to the desk RN. I also called the patient back and left her a message that I received her message and gave tot he desk RN. Also that the desk RN will call her back. Desk RN notified.  JMW

## 2012-12-13 NOTE — Pre-Procedure Instructions (Signed)
[place]  12/13/2012   Your procedure is scheduled on:  Thursday December 20, 2012  Report to [facility] at 1030 AM.  Call this number if you have problems the morning of surgery: [phone]   Remember:   Do not eat food or drink liquids after midnight.   Take these medicines the morning of surgery with A SIP OF WATER: None   Do not wear jewelry, make-up or nail polish.  Do not wear lotions, powders, or perfumes. You may wear deodorant.  Do not shave 48 hours prior to surgery.  Do not bring valuables to the hospital.  Contacts, dentures or bridgework may not be worn into surgery.  Leave suitcase in the car. After surgery it may be brought to your room.  For patients admitted to the hospital, checkout time is 11:00 AM the day of  discharge.   Patients discharged the day of surgery will not be allowed to drive  home.  Name and phone number of your driver:   Special Instructions: Shower using CHG 2 nights before surgery and the night before surgery.  If you shower the day of surgery use CHG.  Use special wash - you have one bottle of CHG for all showers.  You should use approximately 1/3 of the bottle for each shower.   Please read over the following fact sheets that you were given: Pain Booklet, Coughing and Deep Breathing, MRSA Information and Surgical Site Infection Prevention

## 2012-12-13 NOTE — Telephone Encounter (Signed)
Revealed negative multisite for BRCA testing.  The remainder of the panel is being completed and will be finished in 3-4 weeks.

## 2012-12-13 NOTE — Progress Notes (Signed)
Message left by pt stating need to reschedule chemo appt due to scheduled same day as port insertion.  This RN sent electronic schedule change request and left message on VM per given phone number for patient.

## 2012-12-14 ENCOUNTER — Telehealth: Payer: Self-pay | Admitting: Oncology

## 2012-12-14 NOTE — Telephone Encounter (Signed)
Per forwarded message from [facility]. Pt wants to r/s tx due to port is being placed 1/23. Per Val ok to r/s - leave GM 1/21 @ 1:30pm. Per 1/16 note from inf scheduler pt want to move the tx a week out. appts for ched/GM remain 1/21. Lb/tx appts moved from 1/23 and 2/13 to 1/30 and 2/20. lmonvm for pt today re above confirming each appt d/t and asked that pt call me directly if she has questions. Pt given my name/direct number,

## 2012-12-18 ENCOUNTER — Encounter: Payer: Self-pay | Admitting: *Deleted

## 2012-12-18 ENCOUNTER — Other Ambulatory Visit: Payer: 59

## 2012-12-18 ENCOUNTER — Ambulatory Visit (HOSPITAL_BASED_OUTPATIENT_CLINIC_OR_DEPARTMENT_OTHER): Payer: 59 | Admitting: Oncology

## 2012-12-18 ENCOUNTER — Ambulatory Visit: Payer: 59 | Admitting: Oncology

## 2012-12-18 ENCOUNTER — Telehealth: Payer: Self-pay | Admitting: Oncology

## 2012-12-18 VITALS — BP 147/87 | HR 96 | Temp 97.6°F | Resp 20 | Ht 63.0 in | Wt 171.0 lb

## 2012-12-18 DIAGNOSIS — C50911 Malignant neoplasm of unspecified site of right female breast: Secondary | ICD-10-CM

## 2012-12-18 DIAGNOSIS — Z170 Estrogen receptor positive status [ER+]: Secondary | ICD-10-CM

## 2012-12-18 DIAGNOSIS — C50319 Malignant neoplasm of lower-inner quadrant of unspecified female breast: Secondary | ICD-10-CM

## 2012-12-18 MED ORDER — TOBRAMYCIN-DEXAMETHASONE 0.3-0.1 % OP SUSP: 1.0000 [drp] | Freq: Every day | OPHTHALMIC | Status: DC

## 2012-12-18 MED ORDER — LIDOCAINE-PRILOCAINE 2.5-2.5 % EX CREA: TOPICAL_CREAM | CUTANEOUS | Status: DC | PRN

## 2012-12-18 MED ORDER — PROCHLORPERAZINE MALEATE 10 MG PO TABS: 10.0000 mg | ORAL_TABLET | Freq: Four times a day (QID) | ORAL | Status: DC | PRN

## 2012-12-18 MED ORDER — ONDANSETRON HCL 8 MG PO TABS: 8.0000 mg | ORAL_TABLET | Freq: Two times a day (BID) | ORAL | Status: DC | PRN

## 2012-12-18 MED ORDER — DEXAMETHASONE 4 MG PO TABS: 8.0000 mg | ORAL_TABLET | Freq: Two times a day (BID) | ORAL | Status: DC

## 2012-12-18 MED ORDER — LORAZEPAM 0.5 MG PO TABS: 0.5000 mg | ORAL_TABLET | Freq: Every evening | ORAL | Status: DC | PRN

## 2012-12-18 NOTE — Progress Notes (Signed)
ID: Anita Randolph   DOB: May 28, 1951  MR#: [id number]  AOZ#:[id number]  PCP: Mila Palmer GYN: Meredeth Ide SU: Cicero Duck OTHER MD: Arvilla Meres, Amy Swaziland, Rick Cornella   HISTORY OF PRESENT ILLNESS: Anita Randolph herself palpated a mass in her right breast 11/09/2012. She brought this to Dr. Harlene Salts attention and mammography was obtained at [facility], (I do not have that report today) confirming a mass in the lower inner quadrant of the right breast. 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  Bilateral breast MRI 11/22/2012 showed a far posterior right breast mass measuring 3.2 cm with no discernible fat plane between the mass and the underlying pectoralis major. There was extension of irregular enhancement into the superficial aspect of the cholesterol is muscle underlying the mass, but there was no evidence for chest wall involvement, internal mammary artery chain or axillary lymph node involvement, or anything else in the breast or the contralateral breast.  The patient's subsequent history is as detailed below  INTERVAL HISTORY: Anita Randolph came today with her husband Anita Randolph to develop a definitive treatment plan for her newly diagnosed breast cancer   REVIEW OF SYSTEMS: Anita Randolph was having no symptoms related to this mass. She denies unusual headaches, visual changes, dizziness, gait imbalance, nausea, vomiting, or any change in bowel or bladder habits. His been no cough, phlegm production, pleurisy, chest pain or pressure, or palpitations. A detailed review of systems today was entirely negative.  PAST MEDICAL HISTORY: Past Medical History  Diagnosis Date  . Cancer     breast  . Hypertension   . Breast cancer     PAST SURGICAL HISTORY: No past surgical history on file.  FAMILY HISTORY Family History    Problem Relation Age of Onset  . Breast cancer Mother 66  . Heart disease Father   . Bladder Cancer Father 65  . Breast cancer Maternal Aunt     diagnosed in her 88s  . Multiple sclerosis Paternal Aunt   . Heart disease Maternal Grandmother   . Mental retardation Cousin     maternal cousins; brothers, sons of aunt with breast cancer   the patient's father immigrated from [place] 1938, living in [place] for about 10 years. He had significant coronary artery disease, bladder cancer, and a ruptured aneurysm, but lived to be 91. The patient's mother is living at age 38. The patient has one brother, no sisters. The patient's mother had breast cancer diagnosed at age 36. The patient is of a Ashkenazi Jewish this sent. She will be meeting with our genetics counselor today  GYNECOLOGIC HISTORY: Menarche age 50, first live birth age 71, she is GX P2, menopause 2006, received hormone replacement until approximately 2010.  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 Her husband Anita Randolph has a management position for [employer]. This involves a lot of traveling. Son Anita Randolph lives in [place] and is a sports Banker. Daughter Anita Randolph lives in [place], where she is a Gaffer in international developments and is also getting an MPH in Development worker, international aid. The patient has no grandchildren.   ADVANCED DIRECTIVES: In place  HEALTH MAINTENANCE: History  Substance Use Topics  . Smoking status: Former Smoker -- 1.0 packs/day for 4 years    Types: Cigarettes    Quit date: 11/30/1974  . Smokeless  tobacco: Never Used  . Alcohol Use: Yes     Comment: glass of wine on weekend   Emerson exercises 3-4 times a week, doing weights, walking, and other structures exercises.   Colonoscopy: 2009/ [facility]  PAP: 2013  Bone density: 2010  Lipid panel:  No Known Allergies  Current Outpatient Prescriptions  Medication Sig Dispense Refill  . simvastatin  (ZOCOR) 40 MG tablet Daily.      . Vitamin D, Ergocalciferol, (DRISDOL) 50000 UNITS CAPS Once a week.        OBJECTIVE: Middle-aged white woman who appears well Filed Vitals:   12/18/12 1348  BP: 147/87  Pulse: 96  Temp: 97.6 F (36.4 C)  Resp: 20     Body mass index is 30.29 kg/(m^2).    ECOG FS: 0  Sclerae unicteric Oropharynx clear No cervical or supraclavicular adenopathy Lungs no rales or rhonchi Heart regular rate and rhythm Abd benign MSK no focal spinal tenderness, no peripheral edema Neuro: nonfocal Breasts: There is a palpable mass in the lower inner aspect of the right breast, and it appears fixed. I do not see overlying skin involvement, or nipple retraction. The rest of the right breast is unremarkable and the right axilla is clear. The left breast is benign.   LAB RESULTS: Lab Results  Component Value Date   WBC 7.2 12/07/2012      Chemistry      Component Value Date/Time   NA 143 12/07/2012 1206   K 3.7 12/07/2012 1206   CL 106 12/07/2012 1206   CO2 28 12/07/2012 1206   BUN 17.0 12/07/2012 1206   BUN 15 08/23/2010 1350   CREATININE 1.1 12/07/2012 1206   CREATININE 1.00 08/23/2010 1350      Component Value Date/Time   CALCIUM 10.1 12/07/2012 1206       Lab Results  Component Value Date   LABCA2 23 12/07/2012    No components found with this basename: [id number]    No results found for this basename: INR:1;PROTIME:1 in the last 168 hours  Urinalysis No results found for this basename: colorurine,  appearanceur,  labspec,  phurine,  glucoseu,  hgbur,  bilirubinur,  ketonesur,  proteinur,  urobilinogen,  nitrite,  leukocytesur    STUDIES: Ct Chest W Contrast  12/06/2012  *RADIOLOGY REPORT*  Clinical Data: Right breast cancer diagnosed December 2013, history hypertension  CT CHEST WITH CONTRAST  Technique:  Multidetector CT imaging of the chest was performed following the standard protocol during bolus administration of intravenous contrast. Sagittal and  coronal MPR images reconstructed from axial data set.  Contrast: 80mL OMNIPAQUE IOHEXOL 300 MG/ML  SOLN  Comparison: None  Findings: Large soft tissue mass identified in the medial right breast, 3.6 x 3.0 x 3.5 cm, likely representing the patient's known right breast malignancy. Minimal surrounding infiltrative change. Normal-sized axillary nodes bilaterally. No retro pectoral or intrathoracic adenopathy identified. Visualized portion of upper abdomen normal appearance.  Lungs clear. No infiltrate, pleural effusion or mass/nodule. Degenerative disc disease changes T10-T11. Minimal superior endplate height loss at T8 appears old. No additional bony lesions identified.  IMPRESSION: 3.6 x 3.0 x 3.5 cm diameter medial right breast mass likely representing the patient's known right breast malignancy. No evidence of intrathoracic metastatic disease or tumor spread.   Original Report Authenticated By: Ulyses Southward, M.D.    Mr Breast Bilateral W Wo Contrast  11/22/2012  *RADIOLOGY REPORT*  Clinical Data: Newly-diagnosed right breast five o'clock location invasive ductal carcinoma manifesting as a newly palpable  mass and new mammographic finding.  BILATERAL BREAST MRI WITH AND WITHOUT CONTRAST  Technique: Multiplanar, multisequence MR images of both breasts were obtained prior to and following the intravenous administration of 15ml of Multihance.  Three dimensional images were evaluated at the independent DynaCad workstation.  Comparison:  [facility] mammograms 11/15/2012 and prior [facility] mamm[facility] 2012 and earlier  Findings: In the right breast far posterior five o'clock location, there is a 3.2 x 3.1 x 3.1 cm mildly irregular mass with central post biopsy change, corresponding to the biopsy-proven breast cancer.  On T2-weighted images, there is no discernible fat plane between the mass and the underlying pectoralis major muscle.  On postcontrast images, several enhancing vessels extending from the  the mass through the pectoralis muscle are identified, likely related to tumoral neovascularity.  There is also extension of irregular enhancement into the superficial aspect of the pectoralis muscle underlying the mass.  Background enhancement type parenchymal pattern is minimal.  No internal mammary artery chain or axillary lymphadenopathy is identified.  No abnormal T2-weighted hyperintensity in either breast.  No other area of abnormal enhancement is seen on either side.  IMPRESSION: Irregular enhancing far posterior right breast mass 5 o'clock location, with no discernible fat plane between it and the underlying pectoralis major muscle, and minimal irregular enhancement involving the superficial aspect of the pectoralis muscle suggesting tumor involvement.  No evidence for underlying chest wall involvement or internal mammary artery chain or axillary lymphadenopathy.  No other area of abnormal enhancement in either breast to suggest multifocal/multicentric or contralateral malignancy.  RECOMMENDATION: Treatment plan  THREE-DIMENSIONAL MR IMAGE RENDERING ON INDEPENDENT WORKSTATION:  Three-dimensional MR images were rendered by post-processing of the original MR data on an independent workstation.  The three- dimensional MR images were interpreted, and findings were reported in the accompanying complete MRI report for this study.  BI-RADS CATEGORY 6:  Known biopsy-proven malignancy - appropriate action should be taken.   Original Report Authenticated By: Christiana Pellant, M.D.    Nm Pet Image Initial (pi) Skull Base To Thigh  12/06/2012  *RADIOLOGY REPORT*  Clinical Data: Initial treatment strategy for breast cancer. Staging.  NUCLEAR MEDICINE PET SKULL BASE TO THIGH  Fasting Blood Glucose:  93  Technique:  19.8 mCi F-18 FDG was injected intravenously. CT data was obtained and used for attenuation correction and anatomic localization only.  (This was not acquired as a diagnostic CT examination.) Additional exam  technical data entered on technologist worksheet.  Comparison:  CT chest 12/06/2012 and breast MR 11/22/2012.  Findings:  Neck: No hypermetabolic lymph nodes in the neck.  CT images show no acute findings.  Chest:  Medial right breast mass measures 3.0 x 3.7 cm and is intensely hypermetabolic, with an S U V max of 23.2.  No additional areas of abnormal hypermetabolism.  Axillary lymph nodes are sub centimeter in size.  CT images show no acute findings. Specifically, no pericardial or pleural effusion.  No worrisome pulmonary nodules.  Abdomen/Pelvis:  No abnormal hypermetabolism in the abdomen or pelvis.  No hypermetabolic lymph nodes.  CT images show no acute findings.  No free fluid.  Skeleton:  No focal hypermetabolic activity to suggest skeletal metastasis.  IMPRESSION: Intensely hypermetabolic primary right breast mass without evidence of metastatic disease.   Original Report Authenticated By: Leanna Battles, M.D.     ASSESSMENT: 62 y.o.  woman s/p right breast biopsy 11/15/2012 for a clinical T2 N0, stage IIA invasive ductal carcinoma, grade 3, with superficial involvement  of the pectoralis muscle, estrogen receptor 29% positive, progesterone receptor negative, with an Mib-1 of 79% and HER-2 amplification by CISH with a ratio of 4.39  PLAN: We spent the better part of her hour-long visit discussing the biology of breast cancer in her own situation in particular. She will benefit from standard treatment with carboplatin, Taxol, and trastuzumab. We will obtain an echocardiogram before the start of treatment. She will also need a port. In addition, there was no clip placed at the time of biopsy, and in case we have a complete pathologic remission, we will go ahead and have a clip placed in the tumor at the time of port placement.  She will benefit from coming to our "chemotherapy school", and will also meet with our genetics counselor today. Target start chemotherapy date is January  23   Jarred Purtee C    12/18/2012

## 2012-12-18 NOTE — Telephone Encounter (Signed)
gv pt appt schedule for January thru May.

## 2012-12-19 ENCOUNTER — Encounter (HOSPITAL_COMMUNITY): Payer: Self-pay | Admitting: Pharmacy Technician

## 2012-12-19 MED ORDER — CEFAZOLIN SODIUM-DEXTROSE 2-3 GM-% IV SOLR
2.0000 g | INTRAVENOUS | Status: AC
  Administered 2012-12-20: 2 g via INTRAVENOUS
  Filled 2012-12-19: qty 50

## 2012-12-19 MED ORDER — CHLORHEXIDINE GLUCONATE 4 % EX LIQD: 1.0000 "application " | Freq: Once | CUTANEOUS | Status: DC

## 2012-12-20 ENCOUNTER — Other Ambulatory Visit: Payer: 59 | Admitting: Lab

## 2012-12-20 ENCOUNTER — Ambulatory Visit (HOSPITAL_COMMUNITY): Payer: 59

## 2012-12-20 ENCOUNTER — Ambulatory Visit: Payer: 59

## 2012-12-20 ENCOUNTER — Encounter (HOSPITAL_COMMUNITY): Payer: Self-pay | Admitting: *Deleted

## 2012-12-20 ENCOUNTER — Ambulatory Visit (HOSPITAL_COMMUNITY): Payer: 59 | Admitting: Anesthesiology

## 2012-12-20 ENCOUNTER — Encounter (HOSPITAL_COMMUNITY)
Admission: RE | Disposition: A | Discharge status: Home / Self Care | Payer: Self-pay | Source: Ambulatory Visit | Attending: Surgery

## 2012-12-20 ENCOUNTER — Encounter (HOSPITAL_COMMUNITY): Payer: Self-pay | Admitting: Anesthesiology

## 2012-12-20 ENCOUNTER — Ambulatory Visit (HOSPITAL_COMMUNITY)
Admission: RE | Admit: 2012-12-20 | Discharge: 2012-12-20 | Disposition: A | Discharge status: Home / Self Care | Payer: 59 | Source: Ambulatory Visit | Attending: Surgery | Admitting: Surgery

## 2012-12-20 DIAGNOSIS — I10 Essential (primary) hypertension: Secondary | ICD-10-CM | POA: Insufficient documentation

## 2012-12-20 DIAGNOSIS — Z170 Estrogen receptor positive status [ER+]: Secondary | ICD-10-CM | POA: Insufficient documentation

## 2012-12-20 DIAGNOSIS — Z01818 Encounter for other preprocedural examination: Secondary | ICD-10-CM | POA: Insufficient documentation

## 2012-12-20 DIAGNOSIS — Z01810 Encounter for preprocedural cardiovascular examination: Secondary | ICD-10-CM | POA: Insufficient documentation

## 2012-12-20 DIAGNOSIS — C50919 Malignant neoplasm of unspecified site of unspecified female breast: Secondary | ICD-10-CM | POA: Insufficient documentation

## 2012-12-20 DIAGNOSIS — C50911 Malignant neoplasm of unspecified site of right female breast: Secondary | ICD-10-CM

## 2012-12-20 DIAGNOSIS — Z01812 Encounter for preprocedural laboratory examination: Secondary | ICD-10-CM | POA: Insufficient documentation

## 2012-12-20 DIAGNOSIS — Z803 Family history of malignant neoplasm of breast: Secondary | ICD-10-CM | POA: Insufficient documentation

## 2012-12-20 HISTORY — PX: PORTACATH PLACEMENT: SHX2246

## 2012-12-20 SURGERY — INSERTION, TUNNELED CENTRAL VENOUS DEVICE, WITH PORT
Anesthesia: General | Site: Chest | Wound class: Clean

## 2012-12-20 MED ORDER — 0.9 % SODIUM CHLORIDE (POUR BTL) OPTIME
TOPICAL | Status: DC | PRN
  Administered 2012-12-20: 1000 mL

## 2012-12-20 MED ORDER — BUPIVACAINE HCL (PF) 0.25 % IJ SOLN
INTRAMUSCULAR | Status: AC
  Filled 2012-12-20: qty 30

## 2012-12-20 MED ORDER — HEPARIN SOD (PORK) LOCK FLUSH 100 UNIT/ML IV SOLN
INTRAVENOUS | Status: AC
  Filled 2012-12-20: qty 5

## 2012-12-20 MED ORDER — HEPARIN SODIUM (PORCINE) 5000 UNIT/ML IJ SOLN
INTRAMUSCULAR | Status: DC | PRN
  Administered 2012-12-20: 13:00:00

## 2012-12-20 MED ORDER — PROMETHAZINE HCL 25 MG/ML IJ SOLN: 6.2500 mg | INTRAMUSCULAR | Status: DC | PRN

## 2012-12-20 MED ORDER — FENTANYL CITRATE 0.05 MG/ML IJ SOLN
INTRAMUSCULAR | Status: AC
  Filled 2012-12-20: qty 2

## 2012-12-20 MED ORDER — MIDAZOLAM HCL 5 MG/5ML IJ SOLN
INTRAMUSCULAR | Status: DC | PRN
  Administered 2012-12-20: 2 mg via INTRAVENOUS

## 2012-12-20 MED ORDER — LIDOCAINE HCL (CARDIAC) 20 MG/ML IV SOLN
INTRAVENOUS | Status: DC | PRN
  Administered 2012-12-20: 10 mg via INTRAVENOUS

## 2012-12-20 MED ORDER — HEPARIN SOD (PORK) LOCK FLUSH 100 UNIT/ML IV SOLN
INTRAVENOUS | Status: DC | PRN
  Administered 2012-12-20: 500 [IU] via INTRAVENOUS

## 2012-12-20 MED ORDER — FENTANYL CITRATE 0.05 MG/ML IJ SOLN
25.0000 ug | INTRAMUSCULAR | Status: DC | PRN
  Administered 2012-12-20 (×2): 50 ug via INTRAVENOUS

## 2012-12-20 MED ORDER — HYDROCODONE-ACETAMINOPHEN 5-325 MG PO TABS: 1.0000 | ORAL_TABLET | ORAL | Status: DC | PRN

## 2012-12-20 MED ORDER — OXYCODONE HCL 5 MG PO TABS: 5.0000 mg | ORAL_TABLET | Freq: Once | ORAL | Status: DC | PRN

## 2012-12-20 MED ORDER — FENTANYL CITRATE 0.05 MG/ML IJ SOLN
INTRAMUSCULAR | Status: DC | PRN
  Administered 2012-12-20: 100 ug via INTRAVENOUS
  Administered 2012-12-20 (×3): 50 ug via INTRAVENOUS

## 2012-12-20 MED ORDER — LIDOCAINE HCL (PF) 1 % IJ SOLN
INTRAMUSCULAR | Status: AC
  Filled 2012-12-20: qty 30

## 2012-12-20 MED ORDER — LACTATED RINGERS IV SOLN
INTRAVENOUS | Status: DC
  Administered 2012-12-20: 12:00:00 via INTRAVENOUS

## 2012-12-20 MED ORDER — LACTATED RINGERS IV SOLN
INTRAVENOUS | Status: DC | PRN
  Administered 2012-12-20: 13:00:00 via INTRAVENOUS

## 2012-12-20 MED ORDER — PROPOFOL 10 MG/ML IV BOLUS
INTRAVENOUS | Status: DC | PRN
  Administered 2012-12-20: 200 mg via INTRAVENOUS

## 2012-12-20 MED ORDER — ONDANSETRON HCL 4 MG/2ML IJ SOLN
INTRAMUSCULAR | Status: DC | PRN
  Administered 2012-12-20: 4 mg via INTRAVENOUS

## 2012-12-20 MED ORDER — OXYCODONE HCL 5 MG/5ML PO SOLN: 5.0000 mg | Freq: Once | ORAL | Status: DC | PRN

## 2012-12-20 SURGICAL SUPPLY — 56 items
ADH SKN CLS APL DERMABOND .7 (GAUZE/BANDAGES/DRESSINGS)
BAG DECANTER FOR FLEXI CONT (MISCELLANEOUS) ×2 IMPLANT
BLADE SURG 15 STRL LF DISP TIS (BLADE) ×1 IMPLANT
BLADE SURG 15 STRL SS (BLADE) ×2
CANISTER SUCTION 2500CC (MISCELLANEOUS) IMPLANT
CHLORAPREP W/TINT 10.5 ML (MISCELLANEOUS) ×2 IMPLANT
CLOTH BEACON ORANGE TIMEOUT ST (SAFETY) ×2 IMPLANT
COVER SURGICAL LIGHT HANDLE (MISCELLANEOUS) ×2 IMPLANT
CRADLE DONUT ADULT HEAD (MISCELLANEOUS) ×2 IMPLANT
DECANTER SPIKE VIAL GLASS SM (MISCELLANEOUS) ×2 IMPLANT
DERMABOND ADVANCED (GAUZE/BANDAGES/DRESSINGS)
DERMABOND ADVANCED .7 DNX12 (GAUZE/BANDAGES/DRESSINGS) IMPLANT
DRAPE C-ARM 42X72 X-RAY (DRAPES) ×2 IMPLANT
DRAPE LAPAROTOMY TRNSV 102X78 (DRAPE) ×2 IMPLANT
DRAPE UTILITY 15X26 W/TAPE STR (DRAPE) ×4 IMPLANT
ELECT CAUTERY BLADE 6.4 (BLADE) ×2 IMPLANT
ELECT REM PT RETURN 9FT ADLT (ELECTROSURGICAL) ×2
ELECTRODE REM PT RTRN 9FT ADLT (ELECTROSURGICAL) ×1 IMPLANT
GAUZE SPONGE 4X4 16PLY XRAY LF (GAUZE/BANDAGES/DRESSINGS) ×2 IMPLANT
GLOVE BIOGEL PI IND STRL 6.5 (GLOVE) IMPLANT
GLOVE BIOGEL PI IND STRL 7.0 (GLOVE) IMPLANT
GLOVE BIOGEL PI INDICATOR 6.5 (GLOVE) ×1
GLOVE BIOGEL PI INDICATOR 7.0 (GLOVE) ×1
GLOVE ECLIPSE 7.0 STRL STRAW (GLOVE) ×1 IMPLANT
GLOVE EUDERMIC 7 POWDERFREE (GLOVE) ×2 IMPLANT
GLOVE SURG SS PI 6.5 STRL IVOR (GLOVE) ×1 IMPLANT
GOWN PREVENTION PLUS XLARGE (GOWN DISPOSABLE) ×2 IMPLANT
GOWN STRL NON-REIN LRG LVL3 (GOWN DISPOSABLE) ×3 IMPLANT
INTRODUCER 13FR (MISCELLANEOUS) IMPLANT
INTRODUCER COOK 11FR (CATHETERS) IMPLANT
KIT BASIN OR (CUSTOM PROCEDURE TRAY) ×2 IMPLANT
KIT PORT POWER 8FR ISP CVUE (Catheter) ×1 IMPLANT
KIT PORT POWER 9.6FR MRI PREA (Catheter) IMPLANT
KIT PORT POWER ISP 8FR (Catheter) IMPLANT
KIT POWER CATH 8FR (Catheter) IMPLANT
KIT ROOM TURNOVER OR (KITS) ×2 IMPLANT
NDL HYPO 25GX1X1/2 BEV (NEEDLE) ×1 IMPLANT
NEEDLE HYPO 25GX1X1/2 BEV (NEEDLE) ×2 IMPLANT
NS IRRIG 1000ML POUR BTL (IV SOLUTION) ×2 IMPLANT
PACK SURGICAL SETUP 50X90 (CUSTOM PROCEDURE TRAY) ×2 IMPLANT
PAD ARMBOARD 7.5X6 YLW CONV (MISCELLANEOUS) ×3 IMPLANT
PENCIL BUTTON HOLSTER BLD 10FT (ELECTRODE) ×2 IMPLANT
SET INTRODUCER 12FR PACEMAKER (SHEATH) IMPLANT
SET SHEATH INTRODUCER 10FR (MISCELLANEOUS) IMPLANT
SHEATH COOK PEEL AWAY SET 9F (SHEATH) IMPLANT
SUT MNCRL AB 4-0 PS2 18 (SUTURE) ×2 IMPLANT
SUT PROLENE 2 0 SH DA (SUTURE) ×2 IMPLANT
SUT VIC AB 3-0 SH 18 (SUTURE) ×2 IMPLANT
SYR 20ML ECCENTRIC (SYRINGE) ×4 IMPLANT
SYR 5ML LUER SLIP (SYRINGE) ×2 IMPLANT
SYR CONTROL 10ML LL (SYRINGE) ×2 IMPLANT
TOWEL OR 17X24 6PK STRL BLUE (TOWEL DISPOSABLE) ×2 IMPLANT
TOWEL OR 17X26 10 PK STRL BLUE (TOWEL DISPOSABLE) ×2 IMPLANT
TUBE CONNECTING 12X1/4 (SUCTIONS) ×2 IMPLANT
WATER STERILE IRR 1000ML POUR (IV SOLUTION) IMPLANT
YANKAUER SUCT BULB TIP NO VENT (SUCTIONS) ×2 IMPLANT

## 2012-12-20 NOTE — Interval H&P Note (Signed)
History and Physical Interval Note:  12/20/2012 1:16 PM  Anita Randolph  has presented today for surgery, with the diagnosis of BREAST CANCER   The various methods of treatment have been discussed with the patient and family. After consideration of risks, benefits and other options for treatment, the patient has consented to  Procedure(s) (LRB) with comments: INSERTION PORT-A-CATH (N/A) as a surgical intervention .  The patient's history has been reviewed, patient examined, no change in status, stable for surgery.  I have reviewed the patient's chart and labs.  Questions were answered to the patient's satisfaction.     Maree Ainley J

## 2012-12-20 NOTE — Anesthesia Preprocedure Evaluation (Signed)
Anesthesia Evaluation  Patient identified by MRN, date of birth, ID band Patient awake    Reviewed: Allergy & Precautions, H&P , NPO status , Patient's Chart, lab work & pertinent test results  Airway Mallampati: II TM Distance: >3 FB Neck ROM: Full    Dental   Pulmonary former smoker,  breath sounds clear to auscultation        Cardiovascular hypertension, Rhythm:Regular Rate:Normal     Neuro/Psych    GI/Hepatic   Endo/Other    Renal/GU      Musculoskeletal   Abdominal   Peds  Hematology   Anesthesia Other Findings   Reproductive/Obstetrics Breast ca                           Anesthesia Physical Anesthesia Plan  ASA: II  Anesthesia Plan: General   Post-op Pain Management:    Induction: Intravenous  Airway Management Planned: LMA  Additional Equipment:   Intra-op Plan:   Post-operative Plan: Extubation in OR  Informed Consent: I have reviewed the patients History and Physical, chart, labs and discussed the procedure including the risks, benefits and alternatives for the proposed anesthesia with the patient or authorized representative who has indicated his/her understanding and acceptance.     Plan Discussed with: CRNA and Surgeon  Anesthesia Plan Comments:         Anesthesia Quick Evaluation

## 2012-12-20 NOTE — Anesthesia Postprocedure Evaluation (Signed)
  Anesthesia Post-op Note  Patient: Anita Randolph  Procedure(s) Performed: Procedure(s) (LRB) with comments: INSERTION PORT-A-CATH (N/A)  Patient Location: PACU  Anesthesia Type:General  Level of Consciousness: awake, alert , oriented and patient cooperative  Airway and Oxygen Therapy: Patient Spontanous Breathing  Post-op Pain: none  Post-op Assessment: Post-op Vital signs reviewed, Patient's Cardiovascular Status Stable, Respiratory Function Stable, Patent Airway, No signs of Nausea or vomiting and Pain level controlled  Post-op Vital Signs: Reviewed and stable  Complications: No apparent anesthesia complications

## 2012-12-20 NOTE — H&P (View-Only) (Signed)
Patient ID: Anita Randolph, female   DOB: 1951/03/26, 62 y.o.   MRN: [id number]  Chief Complaint  Patient presents with  . Breast Cancer    HPI Anita Randolph is a 62 y.o. female.  He recently found a right breast mass. 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 The MRI shows this is a single lesion. However it appears to be involving the pectoralis major. It is not otherwise symptomatic. Her mother had premenopausal breast cancer but is a 50+ year survivor HPI  Past Medical History  Diagnosis Date  . Cancer     breast  . Hypertension     History reviewed. No pertinent past surgical history.  Family History  Problem Relation Age of Onset  . Cancer Mother     breast  . Heart disease Father   . Cancer Father     bladder  . Cancer Maternal Aunt     breast    Social History History  Substance Use Topics  . Smoking status: Former Smoker    Quit date: 11/30/1964  . Smokeless tobacco: Never Used  . Alcohol Use: Yes     Comment: glass of wine on weekend    No Known Allergies  Current Outpatient Prescriptions  Medication Sig Dispense Refill  . simvastatin (ZOCOR) 40 MG tablet Daily.      . Vitamin D, Ergocalciferol, (DRISDOL) 50000 UNITS CAPS Once a week.        Review of Systems Review of Systems  Constitutional: Negative for fever, chills and unexpected weight change.  HENT: Negative for hearing loss, congestion, sore throat, trouble swallowing and voice change.   Eyes: Negative for visual disturbance.  Respiratory: Negative for cough and wheezing.   Cardiovascular: Negative for chest pain, palpitations and leg swelling.  Gastrointestinal: Negative for nausea, vomiting, abdominal pain, diarrhea, constipation, blood in stool, abdominal distention and anal bleeding.  Genitourinary: Negative for hematuria, vaginal bleeding and difficulty urinating.  Musculoskeletal: Negative  for arthralgias.  Skin: Negative for rash and wound.  Neurological: Negative for seizures, syncope and headaches.  Hematological: Negative for adenopathy. Does not bruise/bleed easily.  Psychiatric/Behavioral: Negative for confusion.    Blood pressure 136/80, pulse 74, temperature 97.9 F (36.6 C), temperature source Temporal, resp. rate 18, height 5\' 4"  (1.626 m), weight 168 lb 4 oz (76.318 kg).  Physical Exam Physical Exam  Vitals reviewed. Constitutional: She is oriented to person, place, and time. She appears well-developed and well-nourished. No distress.  HENT:  Head: Normocephalic and atraumatic.  Mouth/Throat: Oropharynx is clear and moist.  Eyes: Conjunctivae normal and EOM are normal. Pupils are equal, round, and reactive to light. No scleral icterus.  Neck: Normal range of motion. Neck supple. No tracheal deviation present. No thyromegaly present.  Cardiovascular: Normal rate, regular rhythm, normal heart sounds and intact distal pulses.  Exam reveals no gallop and no friction rub.   No murmur heard. Pulmonary/Chest: Effort normal and breath sounds normal. No respiratory distress. She has no wheezes. She has no rales. Right breast exhibits mass.         Hard 3.5 cm very medially located right breast mas, fixed deep  Abdominal: Soft. Bowel sounds are normal. She exhibits no distension and no mass. There is no tenderness. There is no rebound and no guarding.  Musculoskeletal: Normal range of motion. She exhibits no edema and no tenderness.  Lymphadenopathy:    She has no  cervical adenopathy.    She has no axillary adenopathy.       Right: No supraclavicular adenopathy present.       Left: No supraclavicular adenopathy present.  Neurological: She is alert and oriented to person, place, and time.  Skin: Skin is warm and dry. No rash noted. She is not diaphoretic. No erythema.  Psychiatric: She has a normal mood and affect. Her behavior is normal. Judgment and thought content  normal.    Data Reviewed MRI: IMPRESSION:  Irregular enhancing far posterior right breast mass 5 o'clock  location, with no discernible fat plane between it and the  underlying pectoralis major muscle, and minimal irregular  enhancement involving the superficial aspect of the pectoralis  muscle suggesting tumor involvement. No evidence for underlying  chest wall involvement or internal mammary artery chain or axillary  lymphadenopathy.  No other area of abnormal enhancement in either breast to suggest  multifocal/multicentric or contralateral malignancy.  RECOMMENDATION:  Treatment plan  Path: ADDITIONAL INFORMATION: PROGNOSTIC INDICATORS - ACIS Results IMMUNOHISTOCHEMICAL AND MORPHOMETRIC ANALYSIS BY THE AUTOMATED CELLULAR IMAGING SYSTEM (ACIS) Estrogen Receptor (Negative, <1%): 29%, STRONG STAINING INTENSITY Progesterone Receptor (Negative, <1%): 0%, NEGATIVE Proliferation Marker Ki67 by M IB-1 (Low<20%): 79% COMMENT: The negative hormone receptor study in this case has no internal positive control. All controls stained appropriately Pecola Leisure MD Pathologist, Electronic Signature ( Signed 11/23/2012) CHROMOGENIC IN-SITU HYBRIDIZATION Interpretation: HER2/NEU BY CISH - SHOWS AMPLIFICATION BY CISH ANALYSIS. THE RATIO OF HER2: CEP 17 SIGNALS WAS 4.39 Reference range: Ratio: HER2:CEP17 < 1.8 gene amplification not observed Ratio: HER2:CEP 17 1.8-2.2 - equivocal result Ratio: HER2:CEP17 > 2.2 - gene amplification observed Pecola Leisure MD Pathologist, Electronic Signature ( Signed 11/22/2012) 1 of 2 Duplicate copy FINAL for Anita, Randolph ([id number]) FINAL DIAGNOSIS Diagnosis Breast, right, needle core biopsy - INVASIVE DUCTAL CARCINOMA. - SEE COMMENT. Microscopic Comment The carcinoma is grade III. A breast prognostic profile will be performed and the results reported separately. The results were called to [facility] on 11/16/2012. (JBK:gt,  11/16/12) Pecola Leisure MD Pathologist, Electronic Signature (Case signed 11/16/2012)  Assessment    Stage III IDC FH premenopausal breastr cancer    Plan    I have explained the pathophysiology and staging of breast cancer with particular attention to her exact situation. We discussed the multidisciplinary approach to breast cancer which often includes both medical and radiation oncology consultations.  We also discussed surgical options for the treatment of breast cancer including lumpectomy and mastectomy with possible reconstructive surgery. In addition we talked about the evaluation and management of lymph nodes including a description of sentinel lymph node biopsy and axillary dissections. We reviewed potential complications and risks including bleeding, infection, numbness,  lymphedema, and the potential need for additional surgery.  She understands that for patients who are candidate for lumpectomy or mastectomy there is an equal survival rate with either technique, but a slightly higher local recurrence rate with lumpectomy. In addition she knows that a lumpectomy usually requires postoperative radiation as part of the management of the breast cancer.  We have discussed the likely postoperative course and plans for followup.  I have given the patient some written information that reviewed all of these issues. I believe her questions are answered and that she has a good understanding of the issues.  She needs Dentist and med onc appointments. I think she will need neoadjuvant chemo, and discussed port placement. She knows Dr Darnelle Catalan and would like to see him.  Maveric Debono J 11/30/2012, 9:28 AM

## 2012-12-20 NOTE — Op Note (Signed)
Anita Randolph [birth year] [id number] 12/07/2012  Preoperative diagnosis: PAC needed  Postoperative diagnosis: Same  Procedure: Portacath Placement  Surgeon: Currie Paris, MD, FACS  Anesthesia: General  Clinical History and Indications: The patient is getting ready to begin chemotherapy for her cancer. She  needs a Port-A-Cath for venous access.  Description of Procedure: I have seen the patient in the holding area and confirmed the plans for the procedure as noted above. I reviewed the risks and complications again and the patient has no further questions. She wishes to proceed.   The patient was then taken to the operating room. After satisfactory general anesthesia had been obtained the upper chest and lower neck were prepped and draped as a sterile field. The timeout was done.  The left subclavian vein was entered and the guidewire threaded into the superior vena cava right atrial area under fluoroscopic guidance. An incision was then made on the anterior chest wall and a subcutaneous pocket fashioned for the port reservoir.  The port tubing was then brought through a subcutaneous tunnel from the port site to the guidewire site. The dilator and peel-away sheath were then advanced over the guidewire while monitoring this with fluoroscopy. The guidewire and dilator were removed and the tubing threaded to approximately 22 cm. The peel-away sheath was then removed. The catheter aspirated and flushed easily. Using fluoroscopy the tip was backed out into the superior vena cava right atrial junction area. It aspirated and flushed easily. The reservoir was attached and the locking mechanism engaged. That aspirated and flushed easily.  The reservoir was secured to the fascia with 2 sutures of 2-0 Prolene. A final check with fluoroscopy was done to make sure we had no kinks and good positioning of the tip of the catheter. Everything appeared to be okay. The catheter was aspirated, flushed  with dilute heparin and then concentrated aqueous heparin.  The incision was then closed with interrupted 3-0 Vicryl, and 4-0 Monocryl subcuticular with Dermabond on the skin.  There were no operative complications. Estimated blood loss was minimal. All counts were correct. The patient tolerated the procedure well.  Currie Paris, MD, FACS 12/20/2012 2:31 PM

## 2012-12-20 NOTE — Transfer of Care (Signed)
Immediate Anesthesia Transfer of Care Note  Patient: Anita Randolph  Procedure(s) Performed: Procedure(s) (LRB) with comments: INSERTION PORT-A-CATH (N/A)  Patient Location: PACU  Anesthesia Type:General  Level of Consciousness: awake, alert  and patient cooperative  Airway & Oxygen Therapy: Patient Spontanous Breathing and Patient connected to face mask oxygen  Post-op Assessment: Report given to PACU RN, Post -op Vital signs reviewed and stable, Patient moving all extremities and Patient moving all extremities X 4  Post vital signs: Reviewed and stable  Complications: No apparent anesthesia complications

## 2012-12-21 ENCOUNTER — Telehealth (INDEPENDENT_AMBULATORY_CARE_PROVIDER_SITE_OTHER): Payer: Self-pay | Admitting: General Surgery

## 2012-12-21 ENCOUNTER — Encounter (HOSPITAL_COMMUNITY): Payer: Self-pay | Admitting: Surgery

## 2012-12-21 NOTE — Telephone Encounter (Signed)
Spoke with patient she states she is doing quite well f/u appt is 01/18/13 @840am  for porta cath.

## 2012-12-26 ENCOUNTER — Telehealth: Payer: Self-pay | Admitting: Genetic Counselor

## 2012-12-26 ENCOUNTER — Other Ambulatory Visit: Payer: Self-pay | Admitting: *Deleted

## 2012-12-26 DIAGNOSIS — C50911 Malignant neoplasm of unspecified site of right female breast: Secondary | ICD-10-CM

## 2012-12-26 NOTE — Telephone Encounter (Signed)
Revealed negative genetic test results.  Reported that she did have an ATM VUs and we can petition for family studies.  She will get back to me.

## 2012-12-27 ENCOUNTER — Ambulatory Visit (HOSPITAL_BASED_OUTPATIENT_CLINIC_OR_DEPARTMENT_OTHER): Payer: 59

## 2012-12-27 ENCOUNTER — Other Ambulatory Visit (HOSPITAL_BASED_OUTPATIENT_CLINIC_OR_DEPARTMENT_OTHER): Payer: 59

## 2012-12-27 VITALS — BP 134/81 | HR 66 | Temp 98.2°F | Resp 20

## 2012-12-27 DIAGNOSIS — C50911 Malignant neoplasm of unspecified site of right female breast: Secondary | ICD-10-CM

## 2012-12-27 DIAGNOSIS — C50319 Malignant neoplasm of lower-inner quadrant of unspecified female breast: Secondary | ICD-10-CM

## 2012-12-27 DIAGNOSIS — Z5111 Encounter for antineoplastic chemotherapy: Secondary | ICD-10-CM

## 2012-12-27 DIAGNOSIS — Z5112 Encounter for antineoplastic immunotherapy: Secondary | ICD-10-CM

## 2012-12-27 LAB — CBC WITH DIFFERENTIAL/PLATELET
Basophils Absolute: 0 10*3/uL (ref 0.0–0.1)
EOS%: 0 % (ref 0.0–7.0)
HGB: 14.1 g/dL (ref 11.6–15.9)
MCH: 28 pg (ref 25.1–34.0)
NEUT#: 12 10*3/uL — ABNORMAL HIGH (ref 1.5–6.5)
RBC: 5.03 10*6/uL (ref 3.70–5.45)
RDW: 14.7 % — ABNORMAL HIGH (ref 11.2–14.5)
lymph#: 1.4 10*3/uL (ref 0.9–3.3)
nRBC: 0 % (ref 0–0)

## 2012-12-27 LAB — COMPREHENSIVE METABOLIC PANEL (CC13)
ALT: 13 U/L (ref 0–55)
AST: 15 U/L (ref 5–34)
Albumin: 3.8 g/dL (ref 3.5–5.0)
Alkaline Phosphatase: 89 U/L (ref 40–150)
Glucose: 128 mg/dl — ABNORMAL HIGH (ref 70–99)
Potassium: 3.8 mEq/L (ref 3.5–5.1)
Sodium: 139 mEq/L (ref 136–145)
Total Protein: 7.5 g/dL (ref 6.4–8.3)

## 2012-12-27 MED ORDER — TRASTUZUMAB CHEMO INJECTION 440 MG
6.0000 mg/kg | Freq: Once | INTRAVENOUS | Status: AC
  Administered 2012-12-27: 462 mg via INTRAVENOUS
  Filled 2012-12-27: qty 22

## 2012-12-27 MED ORDER — DIPHENHYDRAMINE HCL 25 MG PO CAPS
25.0000 mg | ORAL_CAPSULE | Freq: Once | ORAL | Status: AC
  Administered 2012-12-27: 25 mg via ORAL

## 2012-12-27 MED ORDER — DOCETAXEL CHEMO INJECTION 160 MG/16ML
75.0000 mg/m2 | Freq: Once | INTRAVENOUS | Status: AC
  Administered 2012-12-27: 140 mg via INTRAVENOUS
  Filled 2012-12-27: qty 14

## 2012-12-27 MED ORDER — CARBOPLATIN CHEMO INJECTION 600 MG/60ML
546.0000 mg | Freq: Once | INTRAVENOUS | Status: AC
  Administered 2012-12-27: 550 mg via INTRAVENOUS
  Filled 2012-12-27: qty 55

## 2012-12-27 MED ORDER — ACETAMINOPHEN 325 MG PO TABS
650.0000 mg | ORAL_TABLET | Freq: Once | ORAL | Status: AC
  Administered 2012-12-27: 650 mg via ORAL

## 2012-12-27 MED ORDER — ONDANSETRON 16 MG/50ML IVPB (CHCC)
16.0000 mg | Freq: Once | INTRAVENOUS | Status: AC
  Administered 2012-12-27: 16 mg via INTRAVENOUS

## 2012-12-27 MED ORDER — HEPARIN SOD (PORK) LOCK FLUSH 100 UNIT/ML IV SOLN
500.0000 [IU] | Freq: Once | INTRAVENOUS | Status: AC | PRN
  Administered 2012-12-27: 500 [IU]
  Filled 2012-12-27: qty 5

## 2012-12-27 MED ORDER — SODIUM CHLORIDE 0.9 % IV SOLN
Freq: Once | INTRAVENOUS | Status: AC
  Administered 2012-12-27: 14:00:00 via INTRAVENOUS

## 2012-12-27 MED ORDER — SODIUM CHLORIDE 0.9 % IJ SOLN
10.0000 mL | INTRAMUSCULAR | Status: DC | PRN
  Administered 2012-12-27: 10 mL
  Filled 2012-12-27: qty 10

## 2012-12-27 MED ORDER — DEXAMETHASONE SODIUM PHOSPHATE 4 MG/ML IJ SOLN
20.0000 mg | Freq: Once | INTRAMUSCULAR | Status: AC
  Administered 2012-12-27: 20 mg via INTRAVENOUS

## 2012-12-27 NOTE — Patient Instructions (Addendum)
[facility] Discharge Instructions for Patients Receiving Chemotherapy  Today you received the following chemotherapy agents :  Taxotere,  Carboplatin,  Herceptin.  To help prevent nausea and vomiting after your treatment, we encourage you to take your nausea medication as reviewed and instructed  by Lew, pharmacist.    If you develop nausea and vomiting that is not controlled by your nausea medication, call the clinic. If it is after clinic hours your family physician or the after hours number for the clinic or go to the Emergency Department.   BELOW ARE SYMPTOMS THAT SHOULD BE REPORTED IMMEDIATELY:  *FEVER GREATER THAN 100.5 F  *CHILLS WITH OR WITHOUT FEVER  NAUSEA AND VOMITING THAT IS NOT CONTROLLED WITH YOUR NAUSEA MEDICATION  *UNUSUAL SHORTNESS OF BREATH  *UNUSUAL BRUISING OR BLEEDING  TENDERNESS IN MOUTH AND THROAT WITH OR WITHOUT PRESENCE OF ULCERS  *URINARY PROBLEMS  *BOWEL PROBLEMS  UNUSUAL RASH Items with * indicate a potential emergency and should be followed up as soon as possible.  One of the nurses will contact you 24 hours after your treatment. Please let the nurse know about any problems that you may have experienced. Feel free to call the clinic you have any questions or concerns. The clinic phone number is [phone].

## 2012-12-27 NOTE — Progress Notes (Signed)
Pt tolerated first time Taxotere without difficulty.  VSS throughout infusion.  Pt was being monitored closely.  Taxotere ran over 2 hrs to minimize hypersensitivity reaction.

## 2012-12-28 ENCOUNTER — Telehealth: Payer: Self-pay | Admitting: *Deleted

## 2012-12-28 ENCOUNTER — Other Ambulatory Visit: Payer: Self-pay | Admitting: Certified Registered Nurse Anesthetist

## 2012-12-28 ENCOUNTER — Ambulatory Visit (HOSPITAL_BASED_OUTPATIENT_CLINIC_OR_DEPARTMENT_OTHER): Payer: 59

## 2012-12-28 VITALS — BP 141/82 | HR 83 | Temp 97.5°F

## 2012-12-28 DIAGNOSIS — C50911 Malignant neoplasm of unspecified site of right female breast: Secondary | ICD-10-CM

## 2012-12-28 DIAGNOSIS — C50319 Malignant neoplasm of lower-inner quadrant of unspecified female breast: Secondary | ICD-10-CM

## 2012-12-28 DIAGNOSIS — Z5189 Encounter for other specified aftercare: Secondary | ICD-10-CM

## 2012-12-28 MED ORDER — PEGFILGRASTIM INJECTION 6 MG/0.6ML
6.0000 mg | Freq: Once | SUBCUTANEOUS | Status: AC
  Administered 2012-12-28: 6 mg via SUBCUTANEOUS
  Filled 2012-12-28: qty 0.6

## 2012-12-28 NOTE — Telephone Encounter (Signed)
Anita Randolph here for Neulasta injection following 1st tc chemo treatment, accompanied by her husband.   States that she has had no nausea, vomiting, or diarrhea.  She is drinking and eating without problems.  All questions answered.  Knows to call the clinic with any problems or questions.

## 2013-01-01 ENCOUNTER — Other Ambulatory Visit: Payer: Self-pay | Admitting: *Deleted

## 2013-01-01 DIAGNOSIS — C50911 Malignant neoplasm of unspecified site of right female breast: Secondary | ICD-10-CM

## 2013-01-02 ENCOUNTER — Ambulatory Visit (HOSPITAL_BASED_OUTPATIENT_CLINIC_OR_DEPARTMENT_OTHER): Payer: 59 | Admitting: Physician Assistant

## 2013-01-02 ENCOUNTER — Telehealth: Payer: Self-pay | Admitting: Oncology

## 2013-01-02 ENCOUNTER — Other Ambulatory Visit (HOSPITAL_BASED_OUTPATIENT_CLINIC_OR_DEPARTMENT_OTHER): Payer: 59 | Admitting: Lab

## 2013-01-02 ENCOUNTER — Encounter: Payer: Self-pay | Admitting: Physician Assistant

## 2013-01-02 VITALS — BP 126/83 | HR 105 | Temp 97.7°F | Resp 20 | Ht 63.0 in | Wt 167.2 lb

## 2013-01-02 DIAGNOSIS — Z170 Estrogen receptor positive status [ER+]: Secondary | ICD-10-CM

## 2013-01-02 DIAGNOSIS — K219 Gastro-esophageal reflux disease without esophagitis: Secondary | ICD-10-CM | POA: Insufficient documentation

## 2013-01-02 DIAGNOSIS — C50319 Malignant neoplasm of lower-inner quadrant of unspecified female breast: Secondary | ICD-10-CM

## 2013-01-02 DIAGNOSIS — C50911 Malignant neoplasm of unspecified site of right female breast: Secondary | ICD-10-CM

## 2013-01-02 LAB — CBC WITH DIFFERENTIAL/PLATELET
EOS%: 0.9 % (ref 0.0–7.0)
LYMPH%: 17.2 % (ref 14.0–49.7)
MCH: 27.8 pg (ref 25.1–34.0)
MCHC: 34 g/dL (ref 31.5–36.0)
MCV: 81.7 fL (ref 79.5–101.0)
MONO%: 11.1 % (ref 0.0–14.0)
NEUT#: 13.7 10*3/uL — ABNORMAL HIGH (ref 1.5–6.5)
RBC: 5.29 10*6/uL (ref 3.70–5.45)
RDW: 14.3 % (ref 11.2–14.5)
nRBC: 0 % (ref 0–0)

## 2013-01-02 MED ORDER — OMEPRAZOLE 40 MG PO CPDR: 40.0000 mg | DELAYED_RELEASE_CAPSULE | Freq: Every day | ORAL | Status: DC

## 2013-01-02 NOTE — Telephone Encounter (Signed)
gv pt appt schedule for February thru May. Pt aware central will call re breast mri to be done @ [facility].

## 2013-01-02 NOTE — Progress Notes (Signed)
ID: Anita Randolph   DOB: 08-03-1951  MR#: [id number]  QMV#:[id number]  PCP: Mila Palmer GYN: Meredeth Ide SU: Cicero Duck OTHER MD: Arvilla Meres, Terrie Haring [place], Rick Cornella   HISTORY OF PRESENT ILLNESS: Anita Randolph herself palpated a mass in her right breast 11/09/2012. She brought this to Dr. Harlene Salts attention and mammography was obtained at [facility], (I do not have that report today) confirming a mass in the lower inner quadrant of the right breast. 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  Bilateral breast MRI 11/22/2012 showed a far posterior right breast mass measuring 3.2 cm with no discernible fat plane between the mass and the underlying pectoralis major. There was extension of irregular enhancement into the superficial aspect of the cholesterol is muscle underlying the mass, but there was no evidence for chest wall involvement, internal mammary artery chain or axillary lymph node involvement, or anything else in the breast or the contralateral breast.  The patient's subsequent history is as detailed below  INTERVAL HISTORY: Anita Randolph returns today for assessment chemotoxicity on day 7 cycle 1 of 6 planned q. three-week doses of docetaxel/carboplatin/trastuzumab. She received Neulasta on day 2 for granulocyte support.  Overall, Anita Randolph seems to have done extremely well. She felt good over the weekend, and was able to work half a day on Monday. Then yesterday and today she has felt increasingly tired. The fatigue seems to have worsened since stopping the dexamethasone and she wonders if she may be having a "steroid crash" which is exactly what this sounds like.  Anita Randolph is only other complaint today is increased reflux which also began approximately 2 days ago. She's having a difficult time eating because of the  discomfort in her esophagus. Fortunately, she's had no nausea or emesis.  REVIEW OF SYSTEMS: Anita Randolph has had no fevers or chills. She denies any signs of abnormal bleeding. Her skin was flushed following chemotherapy but this has resolved. She's had a dry mouth, but no mouth ulcers. She denies any signs of numbness or tingling in the extremities, and has had no peripheral swelling. She's had no change in bowel or bladder habits. She denies any cough, shortness of breath, or chest pain, and has had no abnormal headaches or dizziness.  A detailed review of systems is otherwise noncontributory.   PAST MEDICAL HISTORY: Past Medical History  Diagnosis Date  . Cancer     breast  . Hypertension   . Breast cancer     PAST SURGICAL HISTORY: Past Surgical History  Procedure Date  . Portacath placement 12/20/2012    Procedure: INSERTION PORT-A-CATH;  Surgeon: Currie Paris, MD;  Location: [facility] OR;  Service: General;  Laterality: N/A;    FAMILY HISTORY Family History  Problem Relation Age of Onset  . Breast cancer Mother 47  . Heart disease Father   . Bladder Cancer Father 57  . Breast cancer Maternal Aunt     diagnosed in her 36s  . Multiple sclerosis Paternal Aunt   . Heart disease Maternal Grandmother   . Mental retardation Cousin     maternal cousins; brothers, sons of aunt with breast cancer   the patient's father immigrated from [place] 1938, living in [place] for about 10 years. He had significant coronary artery disease, bladder cancer, and a ruptured aneurysm, but lived to be 91. The patient's mother is  living at age 51. The patient has one brother, no sisters. The patient's mother had breast cancer diagnosed at age 75. The patient is of a Ashkenazi Jewish descent. She will be meeting with our genetics counselor today  GYNECOLOGIC HISTORY: Menarche age 8, first live birth age 8, she is GX P2, menopause 2006, received hormone replacement until approximately 2010.  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 Her husband Anita Randolph has a management position for [employer]. This involves a lot of traveling. Son Anita Randolph lives in [place] and is a sports Banker. Daughter Anita Randolph lives in [place], where she is a Gaffer in international developments and is also getting an MPH in Development worker, international aid. The patient has no grandchildren.   ADVANCED DIRECTIVES: In place  HEALTH MAINTENANCE: History  Substance Use Topics  . Smoking status: Former Smoker -- 1.0 packs/day for 4 years    Types: Cigarettes    Quit date: 11/30/1974  . Smokeless tobacco: Never Used  . Alcohol Use: Yes     Comment: glass of wine on weekend   Anita Randolph exercises 3-4 times a week, doing weights, walking, and other structures exercises.   Colonoscopy: 2009/ [facility]  PAP: 2013  Bone density: 2010  Lipid panel:  No Known Allergies  Current Outpatient Prescriptions  Medication Sig Dispense Refill  . dexamethasone (DECADRON) 4 MG tablet Take 2 tablets (8 mg total) by mouth 2 (two) times daily with a meal.  30 tablet  4  . lidocaine-prilocaine (EMLA) cream Apply topically as needed. Apply over port site 1-2 hours before chemo and cover with plastic wrap  30 g  4  . LORazepam (ATIVAN) 0.5 MG tablet Take 1 tablet (0.5 mg total) by mouth at bedtime as needed for anxiety.  30 tablet  2  . prochlorperazine (COMPAZINE) 10 MG tablet Take 1 tablet (10 mg total) by mouth every 6 (six) hours as needed.  30 tablet  4  . simvastatin (ZOCOR) 40 MG tablet Take 40 mg by mouth daily.      Marland Kitchen tobramycin-dexamethasone (TOBRADEX) ophthalmic solution Place 1 drop into both eyes daily.  5 mL  0  . Vitamin D, Ergocalciferol, (DRISDOL) 50000 UNITS CAPS Take 50,000 Units by mouth every 7 (seven) days.      Marland Kitchen omeprazole (PRILOSEC) 40 MG capsule Take 1 capsule (40 mg total) by mouth daily.  30 capsule  5  . ondansetron (ZOFRAN) 8 MG tablet Take 1 tablet (8 mg  total) by mouth every 12 (twelve) hours as needed for nausea.  20 tablet  4    OBJECTIVE: Middle-aged white woman who appears well Filed Vitals:   01/02/13 1404  BP: 126/83  Pulse: 105  Temp: 97.7 F (36.5 C)  Resp: 20     Body mass index is 29.62 kg/(m^2).    ECOG FS: 0 Filed Weights   01/02/13 1404  Weight: 167 lb 3.2 oz (75.841 kg)   Sclerae unicteric Oropharynx clear No cervical or supraclavicular adenopathy Lungs no rales or rhonchi Heart regular rate and rhythm Abdomen soft, nontender, positive bowel sounds MSK no focal spinal tenderness, no peripheral edema Neuro: nonfocal, well oriented Breasts: Deferred. Axillae are benign bilaterally palpable adenopathy   LAB RESULTS: CBC    Component Value Date/Time   WBC 19.6* 01/02/2013 1353   RBC 5.29 01/02/2013 1353   HGB 14.7 01/02/2013 1353   HCT 43.2 01/02/2013 1353   PLT 171 01/02/2013 1353   MCV 81.7 01/02/2013  1353   MCH 27.8 01/02/2013 1353   MCHC 34.0 01/02/2013 1353   RDW 14.3 01/02/2013 1353   LYMPHSABS 3.4* 01/02/2013 1353   MONOABS 2.2* 01/02/2013 1353   EOSABS 0.2 01/02/2013 1353   BASOSABS 0.2* 01/02/2013 1353       Chemistry      Component Value Date/Time   NA 139 12/27/2012 1226   K 3.8 12/27/2012 1226   CL 104 12/27/2012 1226   CO2 22 12/27/2012 1226   BUN 25.1 12/27/2012 1226   BUN 15 08/23/2010 1350   CREATININE 1.1 12/27/2012 1226   CREATININE 1.00 08/23/2010 1350      Component Value Date/Time   CALCIUM 9.6 12/27/2012 1226       Lab Results  Component Value Date   LABCA2 23 12/07/2012    STUDIES:   Dg Chest Port 1 View  2012/12/25  *RADIOLOGY REPORT*  Clinical Data: Post left subclavian Port-A-Cath placement  PORTABLE CHEST - 1 VIEW  Comparison: Portable exam 1455 hours compared to 12/13/2012  Findings: New left subclavian power port with tip projecting over SVC. No pneumothorax. Minimal enlargement of cardiac silhouette with pulmonary vascular congestion, potentially accentuated by technique. No acute  infiltrate, pleural effusion or acute osseous findings.  IMPRESSION: No pneumothorax following left subclavian power port placement.   Original Report Authenticated By: Ulyses Southward, M.D.    Dg Fluoro Guide Cv Line-no Report  December 25, 2012  CLINICAL DATA: breast cancer   FLOURO GUIDE CV LINE  Fluoroscopy was utilized by the requesting physician.  No radiographic  interpretation.        ASSESSMENT: 62 y.o. [place] woman   (1)  s/p right breast biopsy 11/15/2012 for a clinical T2 N0, stage IIA invasive ductal carcinoma, grade 3, with superficial involvement of the pectoralis muscle, estrogen receptor 29% positive, progesterone receptor negative, with an Mib-1 of 79% and HER-2 amplification by CISH with a ratio of 4.39  (2)  being treated in the neoadjuvant setting, the plan being to complete a total of 6 q. three-week doses of docetaxel/carboplatin/trastuzumab prior to definitive surgery. Trastuzumab will then be continued for total of one year.  PLAN:  Overall, Akyah tolerated this first cycle of chemotherapy extremely well. I am starting her on omeprazole for steroid induced reflux, 40 mg daily. We will see how she does with her energy level following cycle 2, but may need to consider either a low dose of prednisone on days 4 through 7, or a dexamethasone taper with subsequent cycles if she continues to "crash" after discontinuing the steroids.  I will see Tyeshia again in 2 weeks on February 20 in anticipation of day 1 cycle 2. She voices understanding and agreement with this plan and will call with any changes or problems.    Elecia Serafin    01/02/2013

## 2013-01-03 ENCOUNTER — Encounter: Payer: Self-pay | Admitting: Physician Assistant

## 2013-01-03 ENCOUNTER — Encounter: Payer: Self-pay | Admitting: Genetic Counselor

## 2013-01-05 ENCOUNTER — Encounter: Payer: Self-pay | Admitting: *Deleted

## 2013-01-05 NOTE — Progress Notes (Signed)
Mailed after appt letter to pt. 

## 2013-01-08 ENCOUNTER — Other Ambulatory Visit: Payer: Self-pay | Admitting: *Deleted

## 2013-01-08 MED ORDER — FIRST-DUKES MOUTHWASH MT SUSP: OROMUCOSAL | Status: DC

## 2013-01-08 MED ORDER — FLUCONAZOLE 100 MG PO TABS: 100.0000 mg | ORAL_TABLET | Freq: Every day | ORAL | Status: DC

## 2013-01-08 NOTE — Telephone Encounter (Signed)
Message left by pt stating " at visit last week Amy offered me the magic mouthwash but I didn't think I needed it - now I do. Can she call that in for me "  Return call number given as [phone].  This RN called pt and left message on identified VM stating above called to pharmacy but also requested a return call for further inquiry regarding mouth irritation for other meds if needed.

## 2013-01-08 NOTE — Telephone Encounter (Signed)
Pt returned call and left 2nd message- this time stating she does have " white spot on my tongue and gums ".  This RN returned call and again obtained identified VM. Message left informing pt additional medication called in to pharmacy.

## 2013-01-10 ENCOUNTER — Other Ambulatory Visit: Payer: 59 | Admitting: Lab

## 2013-01-10 ENCOUNTER — Ambulatory Visit: Payer: 59

## 2013-01-12 ENCOUNTER — Other Ambulatory Visit: Payer: Self-pay

## 2013-01-15 ENCOUNTER — Encounter: Payer: Self-pay | Admitting: Dietician

## 2013-01-15 NOTE — Progress Notes (Signed)
Breast Cancer Nutrition Class Attendance Note  Date: 01/15/2013  Pt attended [facility]'s Breast Cancer Nutrition Class, "Food For Your Fight". Pt was educated on basic cancer nutrition principles, including plant based diet and principles from AICR  ([employer] Institute [employer] Cancer Research) about latest nutrition findings and recommendations. Questions answered. Handouts and recipes provided.   Kristie Bracewell A. Kayan, RD, LDN Pager: [phone] 

## 2013-01-17 ENCOUNTER — Ambulatory Visit (HOSPITAL_BASED_OUTPATIENT_CLINIC_OR_DEPARTMENT_OTHER): Payer: 59 | Admitting: Physician Assistant

## 2013-01-17 ENCOUNTER — Encounter: Payer: Self-pay | Admitting: Oncology

## 2013-01-17 ENCOUNTER — Encounter: Payer: Self-pay | Admitting: Physician Assistant

## 2013-01-17 ENCOUNTER — Other Ambulatory Visit (HOSPITAL_BASED_OUTPATIENT_CLINIC_OR_DEPARTMENT_OTHER): Payer: 59 | Admitting: Lab

## 2013-01-17 ENCOUNTER — Ambulatory Visit (HOSPITAL_BASED_OUTPATIENT_CLINIC_OR_DEPARTMENT_OTHER): Payer: 59

## 2013-01-17 VITALS — BP 119/77 | HR 108 | Temp 98.2°F | Resp 20 | Ht 63.0 in | Wt 169.3 lb

## 2013-01-17 DIAGNOSIS — C50319 Malignant neoplasm of lower-inner quadrant of unspecified female breast: Secondary | ICD-10-CM

## 2013-01-17 DIAGNOSIS — Z5112 Encounter for antineoplastic immunotherapy: Secondary | ICD-10-CM

## 2013-01-17 DIAGNOSIS — Z170 Estrogen receptor positive status [ER+]: Secondary | ICD-10-CM

## 2013-01-17 DIAGNOSIS — C50911 Malignant neoplasm of unspecified site of right female breast: Secondary | ICD-10-CM

## 2013-01-17 DIAGNOSIS — K219 Gastro-esophageal reflux disease without esophagitis: Secondary | ICD-10-CM

## 2013-01-17 LAB — CBC WITH DIFFERENTIAL/PLATELET
BASO%: 0.1 % (ref 0.0–2.0)
EOS%: 0 % (ref 0.0–7.0)
LYMPH%: 7.4 % — ABNORMAL LOW (ref 14.0–49.7)
MCH: 28 pg (ref 25.1–34.0)
MCHC: 33.7 g/dL (ref 31.5–36.0)
MONO#: 0.2 10*3/uL (ref 0.1–0.9)
RBC: 4.32 10*6/uL (ref 3.70–5.45)
WBC: 10.5 10*3/uL — ABNORMAL HIGH (ref 3.9–10.3)
lymph#: 0.8 10*3/uL — ABNORMAL LOW (ref 0.9–3.3)
nRBC: 0 % (ref 0–0)

## 2013-01-17 LAB — COMPREHENSIVE METABOLIC PANEL (CC13)
ALT: 28 U/L (ref 0–55)
AST: 15 U/L (ref 5–34)
Alkaline Phosphatase: 87 U/L (ref 40–150)
BUN: 19.5 mg/dL (ref 7.0–26.0)
Calcium: 9.8 mg/dL (ref 8.4–10.4)
Chloride: 105 mEq/L (ref 98–107)
Creatinine: 1.1 mg/dL (ref 0.6–1.1)
Potassium: 3.9 mEq/L (ref 3.5–5.1)

## 2013-01-17 MED ORDER — TRASTUZUMAB CHEMO INJECTION 440 MG
6.0000 mg/kg | Freq: Once | INTRAVENOUS | Status: AC
  Administered 2013-01-17: 462 mg via INTRAVENOUS
  Filled 2013-01-17: qty 22

## 2013-01-17 MED ORDER — SODIUM CHLORIDE 0.9 % IV SOLN
546.0000 mg | Freq: Once | INTRAVENOUS | Status: AC
  Administered 2013-01-17: 550 mg via INTRAVENOUS
  Filled 2013-01-17: qty 55

## 2013-01-17 MED ORDER — DIPHENHYDRAMINE HCL 25 MG PO CAPS
25.0000 mg | ORAL_CAPSULE | Freq: Once | ORAL | Status: AC
  Administered 2013-01-17: 25 mg via ORAL

## 2013-01-17 MED ORDER — SODIUM CHLORIDE 0.9 % IV SOLN
Freq: Once | INTRAVENOUS | Status: AC
  Administered 2013-01-17: 10:00:00 via INTRAVENOUS

## 2013-01-17 MED ORDER — ONDANSETRON 16 MG/50ML IVPB (CHCC)
16.0000 mg | Freq: Once | INTRAVENOUS | Status: AC
Start: 2013-01-17 — End: 2013-01-17
  Administered 2013-01-17: 16 mg via INTRAVENOUS

## 2013-01-17 MED ORDER — HEPARIN SOD (PORK) LOCK FLUSH 100 UNIT/ML IV SOLN
500.0000 [IU] | Freq: Once | INTRAVENOUS | Status: AC | PRN
  Administered 2013-01-17: 500 [IU]
  Filled 2013-01-17: qty 5

## 2013-01-17 MED ORDER — SODIUM CHLORIDE 0.9 % IJ SOLN
10.0000 mL | INTRAMUSCULAR | Status: DC | PRN
  Administered 2013-01-17: 10 mL
  Filled 2013-01-17: qty 10

## 2013-01-17 MED ORDER — DOCETAXEL CHEMO INJECTION 160 MG/16ML
75.0000 mg/m2 | Freq: Once | INTRAVENOUS | Status: AC
  Administered 2013-01-17: 140 mg via INTRAVENOUS
  Filled 2013-01-17: qty 14

## 2013-01-17 MED ORDER — ACETAMINOPHEN 325 MG PO TABS
650.0000 mg | ORAL_TABLET | Freq: Once | ORAL | Status: AC
  Administered 2013-01-17: 650 mg via ORAL

## 2013-01-17 MED ORDER — DEXAMETHASONE SODIUM PHOSPHATE 4 MG/ML IJ SOLN
20.0000 mg | Freq: Once | INTRAMUSCULAR | Status: AC
  Administered 2013-01-17: 20 mg via INTRAVENOUS

## 2013-01-17 NOTE — Progress Notes (Signed)
ID: ANDEE CHIVERS   DOB: [birth year]  MR#: [id number]  WUJ#:[id number]  PCP: Mila Palmer GYN: Meredeth Ide SU: Cicero Duck OTHER MD: Arvilla Meres, Ahleah Simko [place], Rick Cornella   HISTORY OF PRESENT ILLNESS: Quenna herself palpated a mass in her right breast 11/09/2012. She brought this to Dr. Harlene Salts attention and mammography was obtained at [facility], (I do not have that report today) confirming a mass in the lower inner quadrant of the right breast. 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  Bilateral breast MRI 11/22/2012 showed a far posterior right breast mass measuring 3.2 cm with no discernible fat plane between the mass and the underlying pectoralis major. There was extension of irregular enhancement into the superficial aspect of the cholesterol is muscle underlying the mass, but there was no evidence for chest wall involvement, internal mammary artery chain or axillary lymph node involvement, or anything else in the breast or the contralateral breast.  The patient's subsequent history is as detailed below  INTERVAL HISTORY: Kimiyo returns today for followup of her locally advanced right breast carcinoma. She is due for day 1 cycle 2 of 6 planned q. three-week doses of docetaxel/carboplatin/trastuzumab being given in the neoadjuvant setting. She receives Neulasta on day 2 for granulocyte support.  Ellamae is feeling well. She tells me it took her about one week to recover, but she feels fully recovered for cycle one at this point, and is ready to "move along". The omeprazole help significantly with her reflux. She is back to having regular bowel movements, although she did have some alternation between constipation and diarrhea for the first week or 2.  Evelyn is also concerned about the fact that a clip was  not placed during the biopsy of her right breast mass. She tells me she thinks she is having a great response and can "hardly feel the tumor" alread after only one cycle of neoadjuvant therapy.   REVIEW OF SYSTEMS: Kayela has had no fevers or chills. She denies any signs of abnormal bleeding, bruising, or additional skin changes.  She denies any signs of numbness or tingling in the extremities, and has had no peripheral swelling. She denies any nausea or emesis. She's had no cough, shortness of breath, or chest pain, and has had no abnormal headaches or dizziness. She also denies any unusual myalgias, arthralgias, or bony pain.  A detailed review of systems is otherwise noncontributory.   PAST MEDICAL HISTORY: Past Medical History  Diagnosis Date  . Cancer     breast  . Hypertension   . Breast cancer     PAST SURGICAL HISTORY: Past Surgical History  Procedure Laterality Date  . Portacath placement  12/20/2012    Procedure: INSERTION PORT-A-CATH;  Surgeon: Currie Paris, MD;  Location[facility] OR;  Service: General;  Laterality: N/A;    FAMILY HISTORY Family History  Problem Relation Age of Onset  . Breast cancer Mother 10  . Heart disease Father   . Bladder Cancer Father 6  . Breast cancer Maternal Aunt     diagnosed in her 5s  . Multiple sclerosis Paternal Aunt   . Heart disease Maternal Grandmother   . Mental retardation Cousin     maternal cousins; brothers, sons of aunt with breast cancer   the patient's father immigrated from [place] 1938, living in [place] for  about 10 years. He had significant coronary artery disease, bladder cancer, and a ruptured aneurysm, but lived to be 91. The patient's mother is living at age 55. The patient has one brother, no sisters. The patient's mother had breast cancer diagnosed at age 95. The patient is of a Ashkenazi Jewish descent. She will be meeting with our genetics counselor today  GYNECOLOGIC HISTORY: Menarche age 62, first live birth  age 53, she is GX P2, menopause 2006, received hormone replacement until approximately 2010.  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 Her husband [name] has a management position for [employer]. This involves a lot of traveling. Son [name] lives in [place] and is a sports Banker. Daughter [name] lives in [place], where she is a Gaffer in international developments and is also getting an MPH in Development worker, international aid. The patient has no grandchildren.   ADVANCED DIRECTIVES: In place  HEALTH MAINTENANCE: History  Substance Use Topics  . Smoking status: Former Smoker -- 1.00 packs/day for 4 years    Types: Cigarettes    Quit date: 11/30/1974  . Smokeless tobacco: Never Used  . Alcohol Use: Yes     Comment: glass of wine on weekend   Azaylia exercises 3-4 times a week, doing weights, walking, and other structures exercises.   Colonoscopy: 2009/ [facility]  PAP: 2013  Bone density: 2010  Lipid panel:  No Known Allergies  Current Outpatient Prescriptions  Medication Sig Dispense Refill  . dexamethasone (DECADRON) 4 MG tablet Take 2 tablets (8 mg total) by mouth 2 (two) times daily with a meal.  30 tablet  4  . Diphenhyd-Hydrocort-Nystatin (FIRST-DUKES MOUTHWASH) SUSP 5-47ml QID prn - swish - swallow or spit  240 mL  1  . fluconazole (DIFLUCAN) 100 MG tablet Take 1 tablet (100 mg total) by mouth daily.  7 tablet  0  . lidocaine-prilocaine (EMLA) cream Apply topically as needed. Apply over port site 1-2 hours before chemo and cover with plastic wrap  30 g  4  . LORazepam (ATIVAN) 0.5 MG tablet Take 1 tablet (0.5 mg total) by mouth at bedtime as needed for anxiety.  30 tablet  2  . omeprazole (PRILOSEC) 40 MG capsule Take 1 capsule (40 mg total) by mouth daily.  30 capsule  5  . ondansetron (ZOFRAN) 8 MG tablet Take 1 tablet (8 mg total) by mouth every 12 (twelve) hours as needed for nausea.  20 tablet  4  . prochlorperazine  (COMPAZINE) 10 MG tablet Take 1 tablet (10 mg total) by mouth every 6 (six) hours as needed.  30 tablet  4  . simvastatin (ZOCOR) 40 MG tablet Take 40 mg by mouth daily.      Marland Kitchen tobramycin-dexamethasone (TOBRADEX) ophthalmic solution Place 1 drop into both eyes daily.  5 mL  0   No current facility-administered medications for this visit.    OBJECTIVE: Middle-aged white woman who appears well Filed Vitals:   01/17/13 0856  BP: 119/77  Pulse: 108  Temp: 98.2 F (36.8 C)  Resp: 20     Body mass index is 30 kg/(m^2).    ECOG FS: 0 Filed Weights   01/17/13 0856  Weight: 169 lb 4.8 oz (76.794 kg)   Sclerae unicteric Oropharynx clear No cervical or supraclavicular adenopathy Lungs clear to auscultation with no rales or rhonchi Heart regular rate and rhythm Abdomen soft, nontender, positive bowel sounds MSK no focal spinal tenderness, no peripheral edema Neuro: nonfocal,  well oriented with positive affect Breasts: Is much more difficult to locate the mass in the interior portion of the right breast today, and it is definitely smaller, approximately 1 cm. Left breast is unremarkable. Axillae are benign bilaterally palpable adenopathy   LAB RESULTS: CBC    Component Value Date/Time   WBC 10.5* 01/17/2013 0911   RBC 4.32 01/17/2013 0911   HGB 12.1 01/17/2013 0911   HCT 35.9 01/17/2013 0911   PLT 227 01/17/2013 0911   MCV 83.1 01/17/2013 0911   MCH 28.0 01/17/2013 0911   MCHC 33.7 01/17/2013 0911   RDW 15.3* 01/17/2013 0911   LYMPHSABS 0.8* 01/17/2013 0911   MONOABS 0.2 01/17/2013 0911   EOSABS 0.0 01/17/2013 0911   BASOSABS 0.0 01/17/2013 0911       Chemistry      Component Value Date/Time   NA 140 01/17/2013 0911   K 3.9 01/17/2013 0911   CL 105 01/17/2013 0911   CO2 25 01/17/2013 0911   BUN 19.5 01/17/2013 0911   BUN 15 08/23/2010 1350   CREATININE 1.1 01/17/2013 0911   CREATININE 1.00 08/23/2010 1350      Component Value Date/Time   CALCIUM 9.8 01/17/2013 0911       Lab Results   Component Value Date   LABCA2 23 12/07/2012    STUDIES:   Dg Chest Port 1 View  01-17-2013  *RADIOLOGY REPORT*  Clinical Data: Post left subclavian Port-A-Cath placement  PORTABLE CHEST - 1 VIEW  Comparison: Portable exam 1455 hours compared to 12/13/2012  Findings: New left subclavian power port with tip projecting over SVC. No pneumothorax. Minimal enlargement of cardiac silhouette with pulmonary vascular congestion, potentially accentuated by technique. No acute infiltrate, pleural effusion or acute osseous findings.  IMPRESSION: No pneumothorax following left subclavian power port placement.   Original Report Authenticated By: Ulyses Southward, M.D.    Dg Fluoro Guide Cv Line-no Report  01-17-13  CLINICAL DATA: breast cancer   FLOURO GUIDE CV LINE  Fluoroscopy was utilized by the requesting physician.  No radiographic  interpretation.        ASSESSMENT: 62 y.o. [place] woman   (1)  s/p right breast biopsy 11/15/2012 for a clinical T2 N0, stage IIA invasive ductal carcinoma, grade 3, with superficial involvement of the pectoralis muscle, estrogen receptor 29% positive, progesterone receptor negative, with an Mib-1 of 79% and HER-2 amplification by CISH with a ratio of 4.39  (2)  being treated in the neoadjuvant setting, the plan being to complete a total of 6 q. three-week doses of docetaxel/carboplatin/trastuzumab prior to definitive surgery. Trastuzumab will then be continued for total of one year.  PLAN:  Marty will proceed to treatment today as scheduled for day 1 cycle 2 of 6 planned neoadjuvant cycles of docetaxel/carboplatin/trastuzumab. She will receive Neulasta tomorrow on day 2, is also scheduled to meet with her surgeon, Dr. Jamey Ripa, tomorrow as well. I will see her next week for assessment of chemotoxicity.  Based on her physical exam today, Taffie appears to be having a great response to chemotherapy thus far. We are referring her back to [facility] as soon as possible to have a  clip placed in the right breast. Hopefully this will be done in the next couple of days.   She voices understanding and agreement with this plan and will call with any changes or problems.    Bryar Dahms    01/17/2013

## 2013-01-17 NOTE — Patient Instructions (Addendum)
[facility] Discharge Instructions for Patients Receiving Chemotherapy  Today you received the following chemotherapy agents Taxotere, Carboplatin and Herceptin.  To help prevent nausea and vomiting after your treatment, we encourage you to take your nausea medication.   If you develop nausea and vomiting that is not controlled by your nausea medication, call the clinic. If it is after clinic hours your family physician or the after hours number for the clinic or go to the Emergency Department.   BELOW ARE SYMPTOMS THAT SHOULD BE REPORTED IMMEDIATELY:  *FEVER GREATER THAN 100.5 F  *CHILLS WITH OR WITHOUT FEVER  NAUSEA AND VOMITING THAT IS NOT CONTROLLED WITH YOUR NAUSEA MEDICATION  *UNUSUAL SHORTNESS OF BREATH  *UNUSUAL BRUISING OR BLEEDING  TENDERNESS IN MOUTH AND THROAT WITH OR WITHOUT PRESENCE OF ULCERS  *URINARY PROBLEMS  *BOWEL PROBLEMS  UNUSUAL RASH Items with * indicate a potential emergency and should be followed up as soon as possible.  One of the nurses will contact you 24 hours after your treatment. Please let the nurse know about any problems that you may have experienced. Feel free to call the clinic you have any questions or concerns. The clinic phone number is [phone].   I have been informed and understand all the instructions given to me. I know to contact the clinic, my physician, or go to the Emergency Department if any problems should occur. I do not have any questions at this time, but understand that I may call the clinic during office hours   should I have any questions or need assistance in obtaining follow up care.    __________________________________________  _____________  __________ Signature of Patient or Authorized Representative            Date                   Time    __________________________________________ Nurse's Signature    

## 2013-01-18 ENCOUNTER — Other Ambulatory Visit: Payer: Self-pay | Admitting: Oncology

## 2013-01-18 ENCOUNTER — Encounter (INDEPENDENT_AMBULATORY_CARE_PROVIDER_SITE_OTHER): Payer: Self-pay | Admitting: Surgery

## 2013-01-18 ENCOUNTER — Ambulatory Visit (HOSPITAL_BASED_OUTPATIENT_CLINIC_OR_DEPARTMENT_OTHER): Payer: 59

## 2013-01-18 ENCOUNTER — Ambulatory Visit (INDEPENDENT_AMBULATORY_CARE_PROVIDER_SITE_OTHER): Payer: 59 | Admitting: Surgery

## 2013-01-18 VITALS — BP 127/76 | HR 83 | Temp 97.2°F

## 2013-01-18 VITALS — BP 126/70 | HR 96 | Resp 14 | Ht 64.0 in | Wt 171.0 lb

## 2013-01-18 DIAGNOSIS — Z5189 Encounter for other specified aftercare: Secondary | ICD-10-CM

## 2013-01-18 DIAGNOSIS — Z09 Encounter for follow-up examination after completed treatment for conditions other than malignant neoplasm: Secondary | ICD-10-CM

## 2013-01-18 DIAGNOSIS — C50319 Malignant neoplasm of lower-inner quadrant of unspecified female breast: Secondary | ICD-10-CM

## 2013-01-18 DIAGNOSIS — C50911 Malignant neoplasm of unspecified site of right female breast: Secondary | ICD-10-CM

## 2013-01-18 MED ORDER — PEGFILGRASTIM INJECTION 6 MG/0.6ML
6.0000 mg | Freq: Once | SUBCUTANEOUS | Status: AC
  Administered 2013-01-18: 6 mg via SUBCUTANEOUS
  Filled 2013-01-18: qty 0.6

## 2013-01-18 MED ORDER — FLUCONAZOLE 100 MG PO TABS: 100.0000 mg | ORAL_TABLET | Freq: Every day | ORAL | Status: DC

## 2013-01-18 NOTE — Progress Notes (Signed)
NAME: Anita Randolph                                            DOB: Feb 04, 1951 DATE: 01/18/2013                                                  MRN: [id number]  CC:  Chief Complaint  Patient presents with  . Routine Post Op    PAC insertion 12/20/2012    HPI: This patient comes in for post op follow-up .Sheunderwent Pac placed on 12/20/12. She feels that she is doing well.  PE:  VITAL SIGNS: BP 126/70  Pulse 96  Resp 14  Ht 5\' 4"  (1.626 m)  Wt 171 lb (77.565 kg)  BMI 29.34 kg/m2  General: The patient appears to be healthy, NAD Incision: Healing nicely  DATA REVIEWED: No new  IMPRESSION: The patient is doing well S/P Pac.    PLAN: RTC before her last chemo to plan surgery

## 2013-01-18 NOTE — Patient Instructions (Signed)
See me again before your last chemo treatment so we can plan surgery

## 2013-01-23 ENCOUNTER — Encounter: Payer: Self-pay | Admitting: Physician Assistant

## 2013-01-23 ENCOUNTER — Ambulatory Visit (HOSPITAL_BASED_OUTPATIENT_CLINIC_OR_DEPARTMENT_OTHER): Payer: 59 | Admitting: Physician Assistant

## 2013-01-23 ENCOUNTER — Telehealth: Payer: Self-pay | Admitting: Oncology

## 2013-01-23 ENCOUNTER — Other Ambulatory Visit (HOSPITAL_BASED_OUTPATIENT_CLINIC_OR_DEPARTMENT_OTHER): Payer: 59 | Admitting: Lab

## 2013-01-23 VITALS — BP 110/76 | HR 116 | Temp 98.3°F | Resp 20 | Ht 64.0 in | Wt 166.5 lb

## 2013-01-23 DIAGNOSIS — Z170 Estrogen receptor positive status [ER+]: Secondary | ICD-10-CM

## 2013-01-23 DIAGNOSIS — C50911 Malignant neoplasm of unspecified site of right female breast: Secondary | ICD-10-CM

## 2013-01-23 DIAGNOSIS — C50319 Malignant neoplasm of lower-inner quadrant of unspecified female breast: Secondary | ICD-10-CM

## 2013-01-23 LAB — CBC WITH DIFFERENTIAL/PLATELET
Basophils Absolute: 0 10*3/uL (ref 0.0–0.1)
Eosinophils Absolute: 0.1 10*3/uL (ref 0.0–0.5)
HGB: 13.4 g/dL (ref 11.6–15.9)
MONO#: 2 10*3/uL — ABNORMAL HIGH (ref 0.1–0.9)
NEUT#: 6.1 10*3/uL (ref 1.5–6.5)
Platelets: 144 10*3/uL — ABNORMAL LOW (ref 145–400)
RBC: 4.8 10*6/uL (ref 3.70–5.45)
RDW: 15.1 % — ABNORMAL HIGH (ref 11.2–14.5)
WBC: 10.9 10*3/uL — ABNORMAL HIGH (ref 3.9–10.3)
nRBC: 0 % (ref 0–0)

## 2013-01-23 NOTE — Telephone Encounter (Signed)
gv pt appt schedule for March thru May. °

## 2013-01-23 NOTE — Progress Notes (Signed)
ID: SHADOE CRYAN   DOB: 23-Mar-1951  MR#: [id number]  NWG#:[id number]  PCP: Mila Palmer GYN: Meredeth Ide SU: Cicero Duck OTHER MD: Arvilla Meres, Marvetta Vohs [place], Rick Cornella   HISTORY OF PRESENT ILLNESS: Anita Randolph palpated a mass in her right breast 11/09/2012. She brought this to Dr. Harlene Salts attention and mammography was obtained at [facility], (I do not have that report today) confirming a mass in the lower inner quadrant of the right breast. 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  Bilateral breast MRI 11/22/2012 showed a far posterior right breast mass measuring 3.2 cm with no discernible fat plane between the mass and the underlying pectoralis major. There was extension of irregular enhancement into the superficial aspect of the cholesterol is muscle underlying the mass, but there was no evidence for chest wall involvement, internal mammary artery chain or axillary lymph node involvement, or anything else in the breast or the contralateral breast.  The patient's subsequent history is as detailed below  INTERVAL HISTORY: Anita Randolph returns today for followup of her locally advanced right breast carcinoma. She is currently day 7 cycle 2 of 6 planned q. three-week doses of docetaxel/carboplatin/trastuzumab being given in the neoadjuvant setting. She receives Neulasta on day 2 for granulocyte support.  Anita Randolph is feeling well today and feels that she has recovered well after cycle 2. She had no problems at all with nausea or emesis until last night, when she took one Compazine for nausea, and it resolved. She's had no actual emesis. She has had some mild constipation for which she is utilizing Colace.  I will mention that Anita Randolph had her clip placed at [facility] last week, and new imaging showed her breast mass down  to 1.6 cm, a fantastic response after only one cycle of chemotherapy!   REVIEW OF SYSTEMS: Anita Randolph has had no fevers or chills. She denies any signs of abnormal bleeding, bruising, or additional skin changes.  She denies any signs of numbness or tingling in the extremities, and has had no peripheral swelling.  She's had no cough, shortness of breath, or chest pain, and has had no abnormal headaches or dizziness. She also denies any unusual myalgias, arthralgias, or bony pain.  A detailed review of systems is otherwise noncontributory.   PAST MEDICAL HISTORY: Past Medical History  Diagnosis Date  . Cancer     breast  . Hypertension   . Breast cancer     PAST SURGICAL HISTORY: Past Surgical History  Procedure Laterality Date  . Portacath placement  12/20/2012    Procedure: INSERTION PORT-A-CATH;  Surgeon: Currie Paris, MD;  Location: [facility] OR;  Service: General;  Laterality: N/A;    FAMILY HISTORY Family History  Problem Relation Age of Onset  . Breast cancer Mother 59  . Heart disease Father   . Bladder Cancer Father 47  . Breast cancer Maternal Aunt     diagnosed in her 40s  . Multiple sclerosis Paternal Aunt   . Heart disease Maternal Grandmother   . Mental retardation Cousin     maternal cousins; brothers, sons of aunt with breast cancer   the patient's father immigrated from [place] 1938, living in [place] for about 10 years. He had significant coronary artery disease, bladder cancer, and a ruptured aneurysm, but lived to be 91. The patient's mother is living at age  65. The patient has one brother, no sisters. The patient's mother had breast cancer diagnosed at age 71. The patient is of a Ashkenazi Jewish descent. She will be meeting with our genetics counselor today  GYNECOLOGIC HISTORY: Menarche age 55, first live birth age 56, she is GX P2, menopause 2006, received hormone replacement until approximately 2010.  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 This involves a lot of traveling. Son Anita Randolph lives in [place] and is a sports Banker. Daughter Anita Randolph lives in [place], where she is a Gaffer in international developments and is also getting an MPH in Development worker, international aid. The patient has no grandchildren.   ADVANCED DIRECTIVES: In place  HEALTH MAINTENANCE: History  Substance Use Topics  . Smoking status: Former Smoker -- 1.00 packs/day for 4 years    Types: Cigarettes    Quit date: 11/30/1974  . Smokeless tobacco: Never Used  . Alcohol Use: Yes     Comment: glass of wine on weekend   Anita Randolph exercises 3-4 times a week, doing weights, walking, and other structures exercises.   Colonoscopy: 2009/ [facility]  PAP: 2013  Bone density: 2010  Lipid panel:  No Known Allergies  Current Outpatient Prescriptions  Medication Sig Dispense Refill  . dexamethasone (DECADRON) 4 MG tablet Take 2 tablets (8 mg total) by mouth 2 (two) times daily with a meal.  30 tablet  4  . Diphenhyd-Hydrocort-Nystatin (FIRST-DUKES MOUTHWASH) SUSP 5-30ml QID prn - swish - swallow or spit  240 mL  1  . fluconazole (DIFLUCAN) 100 MG tablet Take 1 tablet (100 mg total) by mouth daily.  7 tablet  1  . lidocaine-prilocaine (EMLA) cream Apply topically as needed. Apply over port site 1-2 hours before chemo and cover with plastic wrap  30 g  4  . LORazepam (ATIVAN) 0.5 MG tablet Take 1 tablet (0.5 mg total) by mouth at bedtime as needed for anxiety.  30 tablet  2  . omeprazole (PRILOSEC) 40 MG capsule Take 1 capsule (40 mg total) by mouth daily.  30 capsule  5  . ondansetron (ZOFRAN) 8 MG tablet Take 1 tablet (8 mg total) by mouth every 12 (twelve) hours as needed for nausea.  20 tablet  4  . prochlorperazine (COMPAZINE) 10 MG tablet Take 1 tablet (10 mg total) by mouth every 6 (six) hours as needed.  30 tablet  4  . simvastatin (ZOCOR) 40 MG tablet Take 40 mg by  mouth daily.      Marland Kitchen tobramycin-dexamethasone (TOBRADEX) ophthalmic solution Place 1 drop into both eyes daily.  5 mL  0   No current facility-administered medications for this visit.    OBJECTIVE: Middle-aged white woman who appears well Filed Vitals:   01/23/13 1510  BP: 110/76  Pulse: 116  Temp: 98.3 F (36.8 C)  Resp: 20     Body mass index is 28.57 kg/(m^2).    ECOG FS: 0 Filed Weights   01/23/13 1510  Weight: 166 lb 8 oz (75.524 kg)   Sclerae unicteric Oropharynx clear No cervical or supraclavicular adenopathy Lungs clear to auscultation with no rales or rhonchi Heart regular rate and rhythm Abdomen soft, nontender, positive bowel sounds MSK no focal spinal tenderness, no peripheral edema Neuro: nonfocal, well oriented with positive affect Breasts: Deferred. Axillae are benign bilaterally palpable adenopathy   LAB RESULTS: CBC    Component Value Date/Time   WBC  10.9* 01/23/2013 1457   RBC 4.80 01/23/2013 1457   HGB 13.4 01/23/2013 1457   HCT 38.8 01/23/2013 1457   PLT 144* 01/23/2013 1457   MCV 80.8 01/23/2013 1457   MCH 27.9 01/23/2013 1457   MCHC 34.5 01/23/2013 1457   RDW 15.1* 01/23/2013 1457   LYMPHSABS 2.5 01/23/2013 1457   MONOABS 2.0* 01/23/2013 1457   EOSABS 0.1 01/23/2013 1457   BASOSABS 0.0 01/23/2013 1457       Chemistry      Component Value Date/Time   NA 140 01/17/2013 0911   K 3.9 01/17/2013 0911   CL 105 01/17/2013 0911   CO2 25 01/17/2013 0911   BUN 19.5 01/17/2013 0911   BUN 15 08/23/2010 1350   CREATININE 1.1 01/17/2013 0911   CREATININE 1.00 08/23/2010 1350      Component Value Date/Time   CALCIUM 9.8 01/17/2013 0911       Lab Results  Component Value Date   LABCA2 23 12/07/2012    STUDIES:    Dg Chest Port 1 View  12/20/2012  *RADIOLOGY REPORT*  Clinical Data: Post left subclavian Port-A-Cath placement  PORTABLE CHEST - 1 VIEW  Comparison: Portable exam 1455 hours compared to 12/13/2012  Findings: New left subclavian power port with tip  projecting over SVC. No pneumothorax. Minimal enlargement of cardiac silhouette with pulmonary vascular congestion, potentially accentuated by technique. No acute infiltrate, pleural effusion or acute osseous findings.  IMPRESSION: No pneumothorax following left subclavian power port placement.   Original Report Authenticated By: Ulyses Southward, M.D.    Dg Fluoro Guide Cv Line-no Report  12/20/2012  CLINICAL DATA: breast cancer   FLOURO GUIDE CV LINE  Fluoroscopy was utilized by the requesting physician.  No radiographic  interpretation.        ASSESSMENT: 62 y.o. [place] woman   (1)  s/p right breast biopsy 11/15/2012 for a clinical T2 N0, stage IIA invasive ductal carcinoma, grade 3, with superficial involvement of the pectoralis muscle, estrogen receptor 29% positive, progesterone receptor negative, with an Mib-1 of 79% and HER-2 amplification by CISH with a ratio of 4.39  (2)  being treated in the neoadjuvant setting, the plan being to complete a total of 6 q. three-week doses of docetaxel/carboplatin/trastuzumab prior to definitive surgery. Trastuzumab will then be continued for total of one year.  PLAN:  Anita Randolph tolerated the second cycle of chemotherapy extremely well. By decreasing her dose of dexamethasone slightly, it decreased the "crash" following chemotherapy, but also controlled her side effects very well.  Anita Randolph will return in 2 weeks on March 13 and anticipation of her third cycle of neoadjuvant chemotherapy. She's already scheduled for repeat breast MRI on April 2 2 assess response to treatment, and she is also scheduled to meet with both Dr. Darnelle Catalan and Dr. Jamey Ripa in April to review those results.  She voices understanding and agreement with this plan and will call with any changes or problems.    Fradel Baldonado    01/23/2013

## 2013-01-30 ENCOUNTER — Encounter: Payer: Self-pay | Admitting: Oncology

## 2013-01-30 NOTE — Progress Notes (Signed)
Put fmla form on nurse's desk °

## 2013-02-05 ENCOUNTER — Encounter: Payer: Self-pay | Admitting: Oncology

## 2013-02-05 NOTE — Progress Notes (Signed)
Faxed disability form to [phone].

## 2013-02-07 ENCOUNTER — Ambulatory Visit (HOSPITAL_BASED_OUTPATIENT_CLINIC_OR_DEPARTMENT_OTHER): Payer: 59 | Admitting: Physician Assistant

## 2013-02-07 ENCOUNTER — Encounter: Payer: Self-pay | Admitting: Physician Assistant

## 2013-02-07 ENCOUNTER — Other Ambulatory Visit (HOSPITAL_BASED_OUTPATIENT_CLINIC_OR_DEPARTMENT_OTHER): Payer: 59 | Admitting: Lab

## 2013-02-07 ENCOUNTER — Ambulatory Visit (HOSPITAL_BASED_OUTPATIENT_CLINIC_OR_DEPARTMENT_OTHER): Payer: 59

## 2013-02-07 VITALS — BP 124/82 | HR 116 | Temp 97.8°F | Resp 20 | Ht 64.0 in | Wt 171.9 lb

## 2013-02-07 DIAGNOSIS — Z5111 Encounter for antineoplastic chemotherapy: Secondary | ICD-10-CM

## 2013-02-07 DIAGNOSIS — C50319 Malignant neoplasm of lower-inner quadrant of unspecified female breast: Secondary | ICD-10-CM

## 2013-02-07 DIAGNOSIS — C50911 Malignant neoplasm of unspecified site of right female breast: Secondary | ICD-10-CM

## 2013-02-07 DIAGNOSIS — Z5112 Encounter for antineoplastic immunotherapy: Secondary | ICD-10-CM

## 2013-02-07 DIAGNOSIS — Z170 Estrogen receptor positive status [ER+]: Secondary | ICD-10-CM

## 2013-02-07 LAB — CBC WITH DIFFERENTIAL/PLATELET
Basophils Absolute: 0 10*3/uL (ref 0.0–0.1)
Eosinophils Absolute: 0 10*3/uL (ref 0.0–0.5)
HCT: 32.4 % — ABNORMAL LOW (ref 34.8–46.6)
HGB: 11 g/dL — ABNORMAL LOW (ref 11.6–15.9)
MONO#: 0.1 10*3/uL (ref 0.1–0.9)
NEUT%: 88.4 % — ABNORMAL HIGH (ref 38.4–76.8)
WBC: 8.8 10*3/uL (ref 3.9–10.3)
lymph#: 0.9 10*3/uL (ref 0.9–3.3)

## 2013-02-07 LAB — COMPREHENSIVE METABOLIC PANEL (CC13)
ALT: 47 U/L (ref 0–55)
Alkaline Phosphatase: 95 U/L (ref 40–150)
CO2: 24 mEq/L (ref 22–29)
Creatinine: 1.1 mg/dL (ref 0.6–1.1)
Glucose: 149 mg/dl — ABNORMAL HIGH (ref 70–99)
Sodium: 138 mEq/L (ref 136–145)
Total Bilirubin: 0.33 mg/dL (ref 0.20–1.20)

## 2013-02-07 MED ORDER — ONDANSETRON 16 MG/50ML IVPB (CHCC)
16.0000 mg | Freq: Once | INTRAVENOUS | Status: AC
  Administered 2013-02-07: 16 mg via INTRAVENOUS

## 2013-02-07 MED ORDER — TRASTUZUMAB CHEMO INJECTION 440 MG
6.0000 mg/kg | Freq: Once | INTRAVENOUS | Status: AC
  Administered 2013-02-07: 462 mg via INTRAVENOUS
  Filled 2013-02-07: qty 22

## 2013-02-07 MED ORDER — SODIUM CHLORIDE 0.9 % IV SOLN
Freq: Once | INTRAVENOUS | Status: AC
  Administered 2013-02-07: 11:00:00 via INTRAVENOUS

## 2013-02-07 MED ORDER — HEPARIN SOD (PORK) LOCK FLUSH 100 UNIT/ML IV SOLN
500.0000 [IU] | Freq: Once | INTRAVENOUS | Status: AC | PRN
  Administered 2013-02-07: 500 [IU]
  Filled 2013-02-07: qty 5

## 2013-02-07 MED ORDER — DEXAMETHASONE SODIUM PHOSPHATE 4 MG/ML IJ SOLN
20.0000 mg | Freq: Once | INTRAMUSCULAR | Status: AC
  Administered 2013-02-07: 20 mg via INTRAVENOUS

## 2013-02-07 MED ORDER — DIPHENHYDRAMINE HCL 25 MG PO CAPS
25.0000 mg | ORAL_CAPSULE | Freq: Once | ORAL | Status: AC
  Administered 2013-02-07: 25 mg via ORAL

## 2013-02-07 MED ORDER — SODIUM CHLORIDE 0.9 % IV SOLN
550.0000 mg | Freq: Once | INTRAVENOUS | Status: AC
  Administered 2013-02-07: 550 mg via INTRAVENOUS
  Filled 2013-02-07: qty 55

## 2013-02-07 MED ORDER — ACETAMINOPHEN 325 MG PO TABS
650.0000 mg | ORAL_TABLET | Freq: Once | ORAL | Status: AC
  Administered 2013-02-07: 650 mg via ORAL

## 2013-02-07 MED ORDER — SODIUM CHLORIDE 0.9 % IJ SOLN
10.0000 mL | INTRAMUSCULAR | Status: DC | PRN
  Administered 2013-02-07: 10 mL
  Filled 2013-02-07: qty 10

## 2013-02-07 MED ORDER — DOCETAXEL CHEMO INJECTION 160 MG/16ML
75.0000 mg/m2 | Freq: Once | INTRAVENOUS | Status: AC
  Administered 2013-02-07: 140 mg via INTRAVENOUS
  Filled 2013-02-07: qty 14

## 2013-02-07 NOTE — Patient Instructions (Signed)
[facility] Discharge Instructions for Patients Receiving Chemotherapy  Today you received the following chemotherapy agents taxotere,carboplatin, herceptin. To help prevent nausea and vomiting after your treatment, we encourage you to take your nausea medication compazine or zofran. Begin taking it tonight and take it as often as prescribed.   If you develop nausea and vomiting that is not controlled by your nausea medication, call the clinic. If it is after clinic hours your family physician or the after hours number for the clinic or go to the Emergency Department.   BELOW ARE SYMPTOMS THAT SHOULD BE REPORTED IMMEDIATELY:  *FEVER GREATER THAN 100.5 F  *CHILLS WITH OR WITHOUT FEVER  NAUSEA AND VOMITING THAT IS NOT CONTROLLED WITH YOUR NAUSEA MEDICATION  *UNUSUAL SHORTNESS OF BREATH  *UNUSUAL BRUISING OR BLEEDING  TENDERNESS IN MOUTH AND THROAT WITH OR WITHOUT PRESENCE OF ULCERS  *URINARY PROBLEMS  *BOWEL PROBLEMS  UNUSUAL RASH Items with * indicate a potential emergency and should be followed up as soon as possible.  . Feel free to call the clinic you have any questions or concerns. The clinic phone number is [phone].   I have been informed and understand all the instructions given to me. I know to contact the clinic, my physician, or go to the Emergency Department if any problems should occur. I do not have any questions at this time, but understand that I may call the clinic during office hours   should I have any questions or need assistance in obtaining follow up care.    __________________________________________  _____________  __________ Signature of Patient or Authorized Representative            Date                   Time    __________________________________________ Nurse's Signature

## 2013-02-07 NOTE — Progress Notes (Signed)
ID: Anita Randolph   DOB: [birth year]  MR#: [id number]  NWG#:[id number]  PCP: Mila Palmer GYN: Meredeth Ide SU: Cicero Duck OTHER MD: Arvilla Meres, Brighid Koch [place], Rick Cornella   HISTORY OF PRESENT ILLNESS: Anita Randolph herself palpated a mass in her right breast 11/09/2012. She brought this to Dr. Harlene Salts attention and mammography was obtained at [facility], (I do not have that report today) confirming a mass in the lower inner quadrant of the right breast. 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  Bilateral breast MRI 11/22/2012 showed a far posterior right breast mass measuring 3.2 cm with no discernible fat plane between the mass and the underlying pectoralis major. There was extension of irregular enhancement into the superficial aspect of the cholesterol is muscle underlying the mass, but there was no evidence for chest wall involvement, internal mammary artery chain or axillary lymph node involvement, or anything else in the breast or the contralateral breast.  The patient's subsequent history is as detailed below  INTERVAL HISTORY: Anita Randolph returns today for followup of her locally advanced right breast carcinoma. She is due for day 1 cycle 3 of 6 planned q. three-week doses of docetaxel/carboplatin/trastuzumab being given in the neoadjuvant setting. She receives Neulasta on day 2 for granulocyte support.  Anita Randolph is feeling well today and is ready to proceed with treatment. She tells me the first week after chemotherapy is the worst, but then she feels "pretty normal" on weeks 2 and 3. Her energy level is good today.   REVIEW OF SYSTEMS: Anita Randolph has had no fevers or chills. She denies any signs of abnormal bleeding, bruising, or additional skin changes.  She denies any signs of numbness or tingling in the  extremities, and has had no peripheral swelling.  She is occasional nausea for which she takes Compazine, but has had no emesis. She denies any change in bowel or bladder habits. She's had no cough, shortness of breath, or chest pain, and has had no abnormal headaches or dizziness. She also denies any unusual myalgias, arthralgias, or bony pain.  A detailed review of systems is otherwise noncontributory.   PAST MEDICAL HISTORY: Past Medical History  Diagnosis Date  . Cancer     breast  . Hypertension   . Breast cancer     PAST SURGICAL HISTORY: Past Surgical History  Procedure Laterality Date  . Portacath placement  12/20/2012    Procedure: INSERTION PORT-A-CATH;  Surgeon: Currie Paris, MD;  Location: [facility] OR;  Service: General;  Laterality: N/A;    FAMILY HISTORY Family History  Problem Relation Age of Onset  . Breast cancer Mother 72  . Heart disease Father   . Bladder Cancer Father 83  . Breast cancer Maternal Aunt     diagnosed in her 86s  . Multiple sclerosis Paternal Aunt   . Heart disease Maternal Grandmother   . Mental retardation Cousin     maternal cousins; brothers, sons of aunt with breast cancer   the patient's father immigrated from [place] 1938, living in [place] for about 10 years. He had significant coronary artery disease, bladder cancer, and a ruptured aneurysm, but lived to be 91. The patient's mother is living at age 48. The patient has one brother, no sisters. The patient's mother had breast cancer diagnosed at age 42. The patient is of a Ashkenazi Jewish  descent. She will be meeting with our genetics counselor today  GYNECOLOGIC HISTORY: Menarche age 84, first live birth age 76, she is GX P2, menopause 2006, received hormone replacement until approximately 2010.  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 Her husband Anita Randolph has a management position for [employer]. This involves a lot of traveling. Son Anita Randolph  lives in [place] and is a sports Banker. Daughter Anita Randolph lives in [place], where she is a Gaffer in international developments and is also getting an MPH in Development worker, international aid. The patient has no grandchildren.   ADVANCED DIRECTIVES: In place  HEALTH MAINTENANCE: History  Substance Use Topics  . Smoking status: Former Smoker -- 1.00 packs/day for 4 years    Types: Cigarettes    Quit date: 11/30/1974  . Smokeless tobacco: Never Used  . Alcohol Use: Yes     Comment: glass of wine on weekend   [name] exercises 3-4 times a week, doing weights, walking, and other structures exercises.   Colonoscopy: 2009/ [facility]  PAP: 2013  Bone density: 2010  Lipid panel:  No Known Allergies  Current Outpatient Prescriptions  Medication Sig Dispense Refill  . dexamethasone (DECADRON) 4 MG tablet Take 2 tablets (8 mg total) by mouth 2 (two) times daily with a meal.  30 tablet  4  . Diphenhyd-Hydrocort-Nystatin (FIRST-DUKES MOUTHWASH) SUSP 5-20ml QID prn - swish - swallow or spit  240 mL  1  . fluconazole (DIFLUCAN) 100 MG tablet Take 1 tablet (100 mg total) by mouth daily.  7 tablet  1  . lidocaine-prilocaine (EMLA) cream Apply topically as needed. Apply over port site 1-2 hours before chemo and cover with plastic wrap  30 g  4  . LORazepam (ATIVAN) 0.5 MG tablet Take 1 tablet (0.5 mg total) by mouth at bedtime as needed for anxiety.  30 tablet  2  . omeprazole (PRILOSEC) 40 MG capsule Take 1 capsule (40 mg total) by mouth daily.  30 capsule  5  . ondansetron (ZOFRAN) 8 MG tablet Take 1 tablet (8 mg total) by mouth every 12 (twelve) hours as needed for nausea.  20 tablet  4  . prochlorperazine (COMPAZINE) 10 MG tablet Take 1 tablet (10 mg total) by mouth every 6 (six) hours as needed.  30 tablet  4  . simvastatin (ZOCOR) 40 MG tablet Take 40 mg by mouth daily.      Marland Kitchen tobramycin-dexamethasone (TOBRADEX) ophthalmic solution Place 1 drop into both eyes daily.  5 mL  0   No  current facility-administered medications for this visit.   Facility-Administered Medications Ordered in Other Visits  Medication Dose Route Frequency Provider Last Rate Last Dose  . CARBOplatin (PARAPLATIN) 550 mg in sodium chloride 0.9 % 250 mL chemo infusion  550 mg Intravenous Once Adine Heimann G Chasya Zenz, PA-C      . DOCEtaxel (TAXOTERE) 140 mg in dextrose 5 % 250 mL chemo infusion  75 mg/m2 (Treatment Plan Actual) Intravenous Once Catalina Gravel, PA-C 264 mL/hr at 02/07/13 1238 140 mg at 02/07/13 1238  . heparin lock flush 100 unit/mL  500 Units Intracatheter Once PRN Jelitza Manninen G Dyneisha Murchison, PA-C      . sodium chloride 0.9 % injection 10 mL  10 mL Intracatheter PRN Akash Winski Allegra Grana, PA-C        OBJECTIVE: Middle-aged white woman who appears well Filed Vitals:   02/07/13 0943  BP: 124/82  Pulse: 116  Temp: 97.8 F (36.6 C)  Resp: 20     Body mass index is 29.49 kg/(m^2).    ECOG FS: 0 Filed Weights   02/07/13 0943  Weight: 171 lb 14.4 oz (77.973 kg)   Sclerae unicteric Oropharynx clear No cervical or supraclavicular adenopathy Lungs clear to auscultation with no rales or rhonchi Heart regular rate and rhythm Abdomen soft, nontender, positive bowel sounds MSK no focal spinal tenderness No peripheral edema Neuro: nonfocal, well oriented with positive affect Breasts: Unable to palpate a distinct mass in the right breast today. No obvious skin changes, no nipple inversion. Left breast is unremarkable.  Axillae are benign bilaterally palpable adenopathy   LAB RESULTS: CBC    Component Value Date/Time   WBC 8.8 02/07/2013 0926   RBC 3.87 02/07/2013 0926   HGB 11.0* 02/07/2013 0926   HCT 32.4* 02/07/2013 0926   PLT 312 02/07/2013 0926   MCV 83.7 02/07/2013 0926   MCH 28.4 02/07/2013 0926   MCHC 34.0 02/07/2013 0926   RDW 16.8* 02/07/2013 0926   LYMPHSABS 0.9 02/07/2013 0926   MONOABS 0.1 02/07/2013 0926   EOSABS 0.0 02/07/2013 0926   BASOSABS 0.0 02/07/2013 0926       Chemistry      Component Value  Date/Time   NA 138 02/07/2013 0926   K 4.5 02/07/2013 0926   CL 105 02/07/2013 0926   CO2 24 02/07/2013 0926   BUN 24.3 02/07/2013 0926   BUN 15 08/23/2010 1350   CREATININE 1.1 02/07/2013 0926   CREATININE 1.00 08/23/2010 1350      Component Value Date/Time   CALCIUM 9.6 02/07/2013 0926       Lab Results  Component Value Date   LABCA2 23 12/07/2012    STUDIES:    Dg Chest Port 1 View  12/20/2012  *RADIOLOGY REPORT*  Clinical Data: Post left subclavian Port-A-Cath placement  PORTABLE CHEST - 1 VIEW  Comparison: Portable exam 1455 hours compared to 12/13/2012  Findings: New left subclavian power port with tip projecting over SVC. No pneumothorax. Minimal enlargement of cardiac silhouette with pulmonary vascular congestion, potentially accentuated by technique. No acute infiltrate, pleural effusion or acute osseous findings.  IMPRESSION: No pneumothorax following left subclavian power port placement.   Original Report Authenticated By: Ulyses Southward, M.D.      ASSESSMENT: 62 y.o. [place] woman   (1)  s/p right breast biopsy 11/15/2012 for a clinical T2 N0, stage IIA invasive ductal carcinoma, grade 3, with superficial involvement of the pectoralis muscle, estrogen receptor 29% positive, progesterone receptor negative, with an Mib-1 of 79% and HER-2 amplification by CISH with a ratio of 4.39  (2)  being treated in the neoadjuvant setting, the plan being to complete a total of 6 q. three-week doses of docetaxel/carboplatin/trastuzumab prior to definitive surgery. Trastuzumab will then be continued for total of one year.  PLAN:  Meridith appears to be doing great, and is thrilled with her clinical improvement. She will proceed to her third cycle of docetaxel/carboplatin/trastuzumab today as scheduled. She'll receive Neulasta tomorrow, and I'll see her next week for assessment of chemotoxicity on March 19. She's Re: scheduled for repeat breast MRI in early April to assess response to therapy.  She'll see Dr. Darnelle Catalan and Dr. Jamey Ripa soon thereafter to review those results.  She voices understanding and agreement with this plan and will call with any changes or problems.    Jemya Depierro    02/07/2013

## 2013-02-08 ENCOUNTER — Ambulatory Visit (HOSPITAL_BASED_OUTPATIENT_CLINIC_OR_DEPARTMENT_OTHER): Payer: 59

## 2013-02-08 VITALS — BP 134/80 | HR 108 | Temp 98.2°F

## 2013-02-08 DIAGNOSIS — Z5189 Encounter for other specified aftercare: Secondary | ICD-10-CM

## 2013-02-08 DIAGNOSIS — C50319 Malignant neoplasm of lower-inner quadrant of unspecified female breast: Secondary | ICD-10-CM

## 2013-02-08 DIAGNOSIS — C50911 Malignant neoplasm of unspecified site of right female breast: Secondary | ICD-10-CM

## 2013-02-08 MED ORDER — PEGFILGRASTIM INJECTION 6 MG/0.6ML
6.0000 mg | Freq: Once | SUBCUTANEOUS | Status: AC
  Administered 2013-02-08: 6 mg via SUBCUTANEOUS
  Filled 2013-02-08: qty 0.6

## 2013-02-08 NOTE — Patient Instructions (Addendum)

## 2013-02-09 ENCOUNTER — Other Ambulatory Visit: Payer: Self-pay | Admitting: Oncology

## 2013-02-13 ENCOUNTER — Encounter: Payer: Self-pay | Admitting: Physician Assistant

## 2013-02-13 ENCOUNTER — Ambulatory Visit (HOSPITAL_BASED_OUTPATIENT_CLINIC_OR_DEPARTMENT_OTHER): Payer: 59 | Admitting: Physician Assistant

## 2013-02-13 ENCOUNTER — Other Ambulatory Visit (HOSPITAL_BASED_OUTPATIENT_CLINIC_OR_DEPARTMENT_OTHER): Payer: 59 | Admitting: Lab

## 2013-02-13 VITALS — BP 117/77 | HR 118 | Temp 98.1°F | Resp 20 | Ht 64.0 in | Wt 167.9 lb

## 2013-02-13 DIAGNOSIS — C50319 Malignant neoplasm of lower-inner quadrant of unspecified female breast: Secondary | ICD-10-CM

## 2013-02-13 DIAGNOSIS — C50911 Malignant neoplasm of unspecified site of right female breast: Secondary | ICD-10-CM

## 2013-02-13 DIAGNOSIS — Z170 Estrogen receptor positive status [ER+]: Secondary | ICD-10-CM

## 2013-02-13 LAB — CBC WITH DIFFERENTIAL/PLATELET
BASO%: 0.2 % (ref 0.0–2.0)
EOS%: 0.9 % (ref 0.0–7.0)
HCT: 35.5 % (ref 34.8–46.6)
LYMPH%: 23.6 % (ref 14.0–49.7)
MCH: 28.3 pg (ref 25.1–34.0)
MCHC: 33.5 g/dL (ref 31.5–36.0)
MCV: 84.5 fL (ref 79.5–101.0)
NEUT%: 64 % (ref 38.4–76.8)
Platelets: 273 10*3/uL (ref 145–400)

## 2013-02-13 NOTE — Progress Notes (Signed)
ID: Anita Randolph   DOB: 1950-12-03  MR#: [id number]  KGM#:[id number]  PCP: Mila Palmer GYN: Meredeth Ide SU: Cicero Duck OTHER MD: Arvilla Meres, Opel Lejeune [place], Rick Cornella   HISTORY OF PRESENT ILLNESS: Anita Randolph herself palpated a mass in her right breast 11/09/2012. She brought this to Dr. Harlene Salts attention and mammography was obtained at [facility], (I do not have that report today) confirming a mass in the lower inner quadrant of the right breast. 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  Bilateral breast MRI 11/22/2012 showed a far posterior right breast mass measuring 3.2 cm with no discernible fat plane between the mass and the underlying pectoralis major. There was extension of irregular enhancement into the superficial aspect of the cholesterol is muscle underlying the mass, but there was no evidence for chest wall involvement, internal mammary artery chain or axillary lymph node involvement, or anything else in the breast or the contralateral breast.  The patient's subsequent history is as detailed below  INTERVAL HISTORY: Anita Randolph returns today for followup of her locally advanced right breast carcinoma. She is currently day 7 cycle 3 of 6 planned q. three-week doses of docetaxel/carboplatin/trastuzumab being given in the neoadjuvant setting. She receives Neulasta on day 2 for granulocyte support.  Anita Randolph tells me this has been her "easiest cycle yet". Her energy level has improved. She had no problems with nausea or emesis whatsoever. Her nailbeds are mildly sensitive, but she denies any signs of numbness 13 going in the extremities. In fact Anita Randolph tells me that her biggest problem today is the fact that she has "locked herself out of MyChart" and needs a new password.  REVIEW OF SYSTEMS: Anita Randolph has had no  fevers or chills. She denies any signs of abnormal bleeding, bruising, or additional skin changes.  She has had no peripheral swelling.  She denies any change in bowel or bladder habits. She tends to have diarrhea after treatment, but this has been well-controlled with Imodium. She's had no cough, shortness of breath, or chest pain, and has had no abnormal headaches or dizziness. She also denies any unusual myalgias, arthralgias, or bony pain.  A detailed review of systems is otherwise noncontributory.   PAST MEDICAL HISTORY: Past Medical History  Diagnosis Date  . Cancer     breast  . Hypertension   . Breast cancer     PAST SURGICAL HISTORY: Past Surgical History  Procedure Laterality Date  . Portacath placement  12/20/2012    Procedure: INSERTION PORT-A-CATH;  Surgeon: Currie Paris, MD;  Location: [facility] OR;  Service: General;  Laterality: N/A;    FAMILY HISTORY Family History  Problem Relation Age of Onset  . Breast cancer Mother 66  . Heart disease Father   . Bladder Cancer Father 10  . Breast cancer Maternal Aunt     diagnosed in her 29s  . Multiple sclerosis Paternal Aunt   . Heart disease Maternal Grandmother   . Mental retardation Cousin     maternal cousins; brothers, sons of aunt with breast cancer   the patient's father immigrated from [place] 1938, living in [place] for about 10 years. He had significant coronary artery disease, bladder cancer, and a ruptured aneurysm, but lived to be 91. The patient's mother is living at age 60. The patient has one brother, no sisters. The patient's mother  had breast cancer diagnosed at age 39. The patient is of a Ashkenazi Jewish descent. She will be meeting with our genetics counselor today  GYNECOLOGIC HISTORY: Menarche age 77, first live birth age 53, she is GX P2, menopause 2006, received hormone replacement until approximately 2010.  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 This involves a lot of traveling. Son Anita Randolph lives in [place] and is a sports Banker. Daughter Anita Randolph lives in [place], where she is a Gaffer in international developments and is also getting an MPH in Development worker, international aid. The patient has no grandchildren.   ADVANCED DIRECTIVES: In place  HEALTH MAINTENANCE: History  Substance Use Topics  . Smoking status: Former Smoker -- 1.00 packs/day for 4 years    Types: Cigarettes    Quit date: 11/30/1974  . Smokeless tobacco: Never Used  . Alcohol Use: Yes     Comment: glass of wine on weekend   Neha exercises 3-4 times a week, doing weights, walking, and other structures exercises.   Colonoscopy: 2009/ [facility]  PAP: 2013  Bone density: 2010  Lipid panel:  No Known Allergies  Current Outpatient Prescriptions  Medication Sig Dispense Refill  . dexamethasone (DECADRON) 4 MG tablet Take 2 tablets (8 mg total) by mouth 2 (two) times daily with a meal.  30 tablet  4  . Diphenhyd-Hydrocort-Nystatin (FIRST-DUKES MOUTHWASH) SUSP 5-40ml QID prn - swish - swallow or spit  240 mL  1  . fluconazole (DIFLUCAN) 100 MG tablet Take 1 tablet (100 mg total) by mouth daily.  7 tablet  1  . lidocaine-prilocaine (EMLA) cream Apply topically as needed. Apply over port site 1-2 hours before chemo and cover with plastic wrap  30 g  4  . LORazepam (ATIVAN) 0.5 MG tablet Take 1 tablet (0.5 mg total) by mouth at bedtime as needed for anxiety.  30 tablet  2  . omeprazole (PRILOSEC) 40 MG capsule Take 1 capsule (40 mg total) by mouth daily.  30 capsule  5  . ondansetron (ZOFRAN) 8 MG tablet Take 1 tablet (8 mg total) by mouth every 12 (twelve) hours as needed for nausea.  20 tablet  4  . prochlorperazine (COMPAZINE) 10 MG tablet Take 1 tablet (10 mg total) by mouth every 6 (six) hours as needed.  30 tablet  4  . simvastatin (ZOCOR) 40 MG tablet Take 40 mg by mouth daily.      Marland Kitchen  tobramycin-dexamethasone (TOBRADEX) ophthalmic solution PLACE 1 DROP INTO BOTH EYES EVERY DAY  5 mL  0   No current facility-administered medications for this visit.    OBJECTIVE: Middle-aged white woman who appears well Filed Vitals:   02/13/13 0858  BP: 117/77  Pulse: 118  Temp: 98.1 F (36.7 C)  Resp: 20     Body mass index is 28.81 kg/(m^2).    ECOG FS: 0 Filed Weights   02/13/13 0858  Weight: 167 lb 14.4 oz (76.159 kg)   Sclerae unicteric Oropharynx clear No cervical or supraclavicular adenopathy Lungs clear to auscultation with no rales or rhonchi Heart regular rate and rhythm Abdomen soft, nontender, positive bowel sounds MSK no focal spinal tenderness No peripheral edema Neuro: nonfocal, well oriented with positive affect Breasts: Deferred.  Axillae are benign bilaterally palpable adenopathy   LAB RESULTS: CBC    Component Value Date/Time   WBC 9.0 02/13/2013 0816   RBC 4.20 02/13/2013 0816  HGB 11.9 02/13/2013 0816   HCT 35.5 02/13/2013 0816   PLT 273 02/13/2013 0816   MCV 84.5 02/13/2013 0816   MCH 28.3 02/13/2013 0816   MCHC 33.5 02/13/2013 0816   RDW 17.1* 02/13/2013 0816   LYMPHSABS 2.1 02/13/2013 0816   MONOABS 1.0* 02/13/2013 0816   EOSABS 0.1 02/13/2013 0816   BASOSABS 0.0 02/13/2013 0816       Chemistry      Component Value Date/Time   NA 138 02/07/2013 0926   K 4.5 02/07/2013 0926   CL 105 02/07/2013 0926   CO2 24 02/07/2013 0926   BUN 24.3 02/07/2013 0926   BUN 15 08/23/2010 1350   CREATININE 1.1 02/07/2013 0926   CREATININE 1.00 08/23/2010 1350      Component Value Date/Time   CALCIUM 9.6 02/07/2013 0926       Lab Results  Component Value Date   LABCA2 23 12/07/2012    STUDIES:    Dg Chest Port 1 View  12/20/2012  *RADIOLOGY REPORT*  Clinical Data: Post left subclavian Port-A-Cath placement  PORTABLE CHEST - 1 VIEW  Comparison: Portable exam 1455 hours compared to 12/13/2012  Findings: New left subclavian power port with tip projecting over  SVC. No pneumothorax. Minimal enlargement of cardiac silhouette with pulmonary vascular congestion, potentially accentuated by technique. No acute infiltrate, pleural effusion or acute osseous findings.  IMPRESSION: No pneumothorax following left subclavian power port placement.   Original Report Authenticated By: Ulyses Southward, M.D.      ASSESSMENT: 62 y.o. [place] woman   (1)  s/p right breast biopsy 11/15/2012 for a clinical T2 N0, stage IIA invasive ductal carcinoma, grade 3, with superficial involvement of the pectoralis muscle, estrogen receptor 29% positive, progesterone receptor negative, with an Mib-1 of 79% and HER-2 amplification by CISH with a ratio of 4.39  (2)  being treated in the neoadjuvant setting, the plan being to complete a total of 6 q. three-week doses of docetaxel/carboplatin/trastuzumab prior to definitive surgery. Trastuzumab will then be continued for total of one year.  PLAN:  Prezley continues to tolerate treatment well. She scheduled for an MRI on April 2 to assess response to therapy thus far, and we'll see Dr. Darnelle Catalan on April 3 to review those results prior to her fourth scheduled dose of neoadjuvant chemotherapy. She is also scheduled to meet with Dr. Jamey Ripa to review those results and again discussing her definitive surgery.  She voices understanding and agreement with this plan and will call with any changes or problems.    Shamecka Hocutt    02/13/2013

## 2013-02-14 ENCOUNTER — Encounter (INDEPENDENT_AMBULATORY_CARE_PROVIDER_SITE_OTHER): Payer: Self-pay

## 2013-02-15 ENCOUNTER — Telehealth: Payer: Self-pay | Admitting: Oncology

## 2013-02-15 NOTE — Telephone Encounter (Signed)
S/w chantel [facility] regarding the pt needing an appt for an echo and to see dr benshimon. Will give mrs linda the echo referral for prcert.

## 2013-02-18 ENCOUNTER — Telehealth: Payer: Self-pay | Admitting: *Deleted

## 2013-02-18 NOTE — Telephone Encounter (Signed)
sw pt gave appts for her echo and to see Dr. Gala Romney. Pt was already aware from my chart

## 2013-02-25 ENCOUNTER — Ambulatory Visit (HOSPITAL_COMMUNITY): Payer: 59

## 2013-02-27 ENCOUNTER — Ambulatory Visit (HOSPITAL_COMMUNITY)
Admission: RE | Admit: 2013-02-27 | Discharge: 2013-02-27 | Disposition: A | Discharge status: Home / Self Care | Payer: 59 | Source: Ambulatory Visit | Attending: Physician Assistant | Admitting: Physician Assistant

## 2013-02-27 DIAGNOSIS — C50319 Malignant neoplasm of lower-inner quadrant of unspecified female breast: Secondary | ICD-10-CM | POA: Insufficient documentation

## 2013-02-27 DIAGNOSIS — Z79899 Other long term (current) drug therapy: Secondary | ICD-10-CM | POA: Insufficient documentation

## 2013-02-27 DIAGNOSIS — C50911 Malignant neoplasm of unspecified site of right female breast: Secondary | ICD-10-CM

## 2013-02-27 MED ORDER — GADOBENATE DIMEGLUMINE 529 MG/ML IV SOLN
16.0000 mL | Freq: Once | INTRAVENOUS | Status: AC | PRN
  Administered 2013-02-27: 16 mL via INTRAVENOUS

## 2013-02-28 ENCOUNTER — Other Ambulatory Visit (HOSPITAL_BASED_OUTPATIENT_CLINIC_OR_DEPARTMENT_OTHER): Payer: 59 | Admitting: Lab

## 2013-02-28 ENCOUNTER — Ambulatory Visit (HOSPITAL_BASED_OUTPATIENT_CLINIC_OR_DEPARTMENT_OTHER): Payer: 59

## 2013-02-28 ENCOUNTER — Ambulatory Visit (HOSPITAL_BASED_OUTPATIENT_CLINIC_OR_DEPARTMENT_OTHER): Payer: 59 | Admitting: Oncology

## 2013-02-28 VITALS — BP 120/91 | HR 94 | Temp 97.8°F | Resp 20

## 2013-02-28 VITALS — BP 120/76 | HR 120 | Temp 98.2°F | Resp 20 | Ht 64.0 in | Wt 173.7 lb

## 2013-02-28 DIAGNOSIS — C50319 Malignant neoplasm of lower-inner quadrant of unspecified female breast: Secondary | ICD-10-CM

## 2013-02-28 DIAGNOSIS — Z170 Estrogen receptor positive status [ER+]: Secondary | ICD-10-CM

## 2013-02-28 DIAGNOSIS — Z5112 Encounter for antineoplastic immunotherapy: Secondary | ICD-10-CM

## 2013-02-28 DIAGNOSIS — C50911 Malignant neoplasm of unspecified site of right female breast: Secondary | ICD-10-CM

## 2013-02-28 DIAGNOSIS — Z5111 Encounter for antineoplastic chemotherapy: Secondary | ICD-10-CM

## 2013-02-28 LAB — COMPREHENSIVE METABOLIC PANEL (CC13)
Albumin: 3.4 g/dL — ABNORMAL LOW (ref 3.5–5.0)
CO2: 23 mEq/L (ref 22–29)
Calcium: 9.5 mg/dL (ref 8.4–10.4)
Glucose: 130 mg/dl — ABNORMAL HIGH (ref 70–99)
Potassium: 4 mEq/L (ref 3.5–5.1)
Sodium: 140 mEq/L (ref 136–145)
Total Protein: 6.8 g/dL (ref 6.4–8.3)

## 2013-02-28 LAB — CBC WITH DIFFERENTIAL/PLATELET
Basophils Absolute: 0 10*3/uL (ref 0.0–0.1)
Eosinophils Absolute: 0 10*3/uL (ref 0.0–0.5)
HGB: 9.7 g/dL — ABNORMAL LOW (ref 11.6–15.9)
MCV: 84.7 fL (ref 79.5–101.0)
MONO%: 1.3 % (ref 0.0–14.0)
NEUT#: 5.6 10*3/uL (ref 1.5–6.5)
RDW: 18.8 % — ABNORMAL HIGH (ref 11.2–14.5)
lymph#: 0.6 10*3/uL — ABNORMAL LOW (ref 0.9–3.3)

## 2013-02-28 MED ORDER — SODIUM CHLORIDE 0.9 % IJ SOLN
10.0000 mL | INTRAMUSCULAR | Status: DC | PRN
  Administered 2013-02-28: 10 mL
  Filled 2013-02-28: qty 10

## 2013-02-28 MED ORDER — SODIUM CHLORIDE 0.9 % IV SOLN
546.0000 mg | Freq: Once | INTRAVENOUS | Status: AC
  Administered 2013-02-28: 550 mg via INTRAVENOUS
  Filled 2013-02-28: qty 55

## 2013-02-28 MED ORDER — SODIUM CHLORIDE 0.9 % IV SOLN
75.0000 mg/m2 | Freq: Once | INTRAVENOUS | Status: AC
  Administered 2013-02-28: 140 mg via INTRAVENOUS
  Filled 2013-02-28: qty 14

## 2013-02-28 MED ORDER — SODIUM CHLORIDE 0.9 % IV SOLN
Freq: Once | INTRAVENOUS | Status: AC
  Administered 2013-02-28: 12:00:00 via INTRAVENOUS

## 2013-02-28 MED ORDER — HEPARIN SOD (PORK) LOCK FLUSH 100 UNIT/ML IV SOLN
500.0000 [IU] | Freq: Once | INTRAVENOUS | Status: AC | PRN
  Administered 2013-02-28: 500 [IU]
  Filled 2013-02-28: qty 5

## 2013-02-28 MED ORDER — TRASTUZUMAB CHEMO INJECTION 440 MG
6.0000 mg/kg | Freq: Once | INTRAVENOUS | Status: AC
  Administered 2013-02-28: 462 mg via INTRAVENOUS
  Filled 2013-02-28: qty 22

## 2013-02-28 MED ORDER — DIPHENHYDRAMINE HCL 25 MG PO CAPS
25.0000 mg | ORAL_CAPSULE | Freq: Once | ORAL | Status: AC
  Administered 2013-02-28: 25 mg via ORAL

## 2013-02-28 MED ORDER — DEXAMETHASONE SODIUM PHOSPHATE 4 MG/ML IJ SOLN
20.0000 mg | Freq: Once | INTRAMUSCULAR | Status: AC
  Administered 2013-02-28: 20 mg via INTRAVENOUS

## 2013-02-28 MED ORDER — ACETAMINOPHEN 325 MG PO TABS
650.0000 mg | ORAL_TABLET | Freq: Once | ORAL | Status: AC
  Administered 2013-02-28: 650 mg via ORAL

## 2013-02-28 MED ORDER — ONDANSETRON 16 MG/50ML IVPB (CHCC)
16.0000 mg | Freq: Once | INTRAVENOUS | Status: AC
  Administered 2013-02-28: 16 mg via INTRAVENOUS

## 2013-02-28 NOTE — Patient Instructions (Addendum)
[facility] Discharge Instructions for Patients Receiving Chemotherapy  Today you received the following chemotherapy agents herceptin, docetaxel, carboplatin  To help prevent nausea and vomiting after your treatment, we encourage you to take your nausea medication as directed by MD Begin taking it at 6 pm if needed.    If you develop nausea and vomiting that is not controlled by your nausea medication, call the clinic. If it is after clinic hours your family physician or the after hours number for the clinic or go to the Emergency Department.   BELOW ARE SYMPTOMS THAT SHOULD BE REPORTED IMMEDIATELY:  *FEVER GREATER THAN 100.5 F  *CHILLS WITH OR WITHOUT FEVER  NAUSEA AND VOMITING THAT IS NOT CONTROLLED WITH YOUR NAUSEA MEDICATION  *UNUSUAL SHORTNESS OF BREATH  *UNUSUAL BRUISING OR BLEEDING  TENDERNESS IN MOUTH AND THROAT WITH OR WITHOUT PRESENCE OF ULCERS  *URINARY PROBLEMS  *BOWEL PROBLEMS  UNUSUAL RASH Items with * indicate a potential emergency and should be followed up as soon as possible.   Feel free to call the clinic you have any questions or concerns. The clinic phone number is [phone].   I have been informed and understand all the instructions given to me. I know to contact the clinic, my physician, or go to the Emergency Department if any problems should occur. I do not have any questions at this time, but understand that I may call the clinic during office hours   should I have any questions or need assistance in obtaining follow up care.    __________________________________________  _____________  __________ Signature of Patient or Authorized Representative            Date                   Time    __________________________________________ Nurse's Signature

## 2013-02-28 NOTE — Progress Notes (Signed)
ID: [name]   DOB: 1951/09/19  MR#: [id number]  NWG#:[id number]  PCP: Anita Randolph GYN: Anita Randolph SU: Cicero Duck OTHER MD: Anita Randolph, Anita Randolph, Anita Randolph   HISTORY OF PRESENT ILLNESS: Anita Randolph herself palpated a mass in her right breast 11/09/2012. She brought this to Dr. Harlene Randolph attention and mammography was obtained at [facility], (I do not have that report today) confirming a mass in the lower inner quadrant of the right breast. 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  Bilateral breast MRI 11/22/2012 showed a far posterior right breast mass measuring 3.2 cm with no discernible fat plane between the mass and the underlying pectoralis major. There was extension of irregular enhancement into the superficial aspect of the cholesterol is muscle underlying the mass, but there was no evidence for chest wall involvement, internal mammary artery chain or axillary lymph node involvement, or anything else in the breast or the contralateral breast.  The patient's subsequent history is as detailed below  INTERVAL HISTORY: Anita Randolph returns today for followup of her locally advanced right breast carcinoma accompanied by her husband Anita Randolph.. She is currently day 1 cycle 4 of 6 planned cycles of docetaxel/ carboplatin/ trastuzumab being given in the neoadjuvant setting. She receives Neulasta on day 2 for granulocyte support.  REVIEW OF SYSTEMS: She generally does well days 1 through 4 but on days 5 and 6 (Monday and Tuesday) she pretty much rashes and has to work out of her home. She has a little bit of a dry cough at times and can feel short of breath when walking up stairs or up a slope, even if it's only the mild slope in our parking lot. She can have loose bowel movements, but no son who deal with that. She  is having absolutely no peripheral neuropathy symptoms. There have been no nausea or vomiting problems. A detailed review of systems was otherwise noncontributory   PAST MEDICAL HISTORY: Past Medical History  Diagnosis Date  . Cancer     breast  . Hypertension   . Breast cancer     PAST SURGICAL HISTORY: Past Surgical History  Procedure Laterality Date  . Portacath placement  12/20/2012    Procedure: INSERTION PORT-A-CATH;  Surgeon: Anita Paris, MD;  Location: [facility] OR;  Service: General;  Laterality: N/A;    FAMILY HISTORY Family History  Problem Relation Age of Onset  . Breast cancer Mother 35  . Heart disease Father   . Bladder Cancer Father 74  . Breast cancer Maternal Aunt     diagnosed in her 49s  . Multiple sclerosis Paternal Aunt   . Heart disease Maternal Grandmother   . Mental retardation Cousin     maternal cousins; brothers, sons of aunt with breast cancer   the patient's father immigrated from [place] 1938, living in [place] for about 10 years. He had significant coronary artery disease, bladder cancer, and a ruptured aneurysm, but lived to be 91. The patient's mother is living at age 34. The patient has one brother, no sisters. The patient's mother had breast cancer diagnosed at age 61. The patient is of a Ashkenazi Jewish descent. She will be meeting with our genetics counselor today  GYNECOLOGIC HISTORY: Menarche age 60, first live birth age 46, she is GX P2, menopause 2006, received hormone replacement until approximately 2010.  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 Her husband Anita Randolph has a management position for [employer]. This involves a lot of traveling. Son Anita Randolph lives in [place] and is a sports Banker. Daughter Anita Randolph lives in [place], where she is a Gaffer in international developments and is also getting an MPH in Development worker, international aid. The patient has no  grandchildren.   ADVANCED DIRECTIVES: In place  HEALTH MAINTENANCE: History  Substance Use Topics  . Smoking status: Former Smoker -- 1.00 packs/day for 4 years    Types: Cigarettes    Quit date: 11/30/1974  . Smokeless tobacco: Never Used  . Alcohol Use: Yes     Comment: glass of wine on weekend   Anita Randolph exercises 3-4 times a week, doing weights, walking, and other structures exercises.   Colonoscopy: 2009/ [facility]  PAP: 2013  Bone density: 2010  Lipid panel:  No Known Allergies  Current Outpatient Prescriptions  Medication Sig Dispense Refill  . dexamethasone (DECADRON) 4 MG tablet Take 2 tablets (8 mg total) by mouth 2 (two) times daily with a meal.  30 tablet  4  . Diphenhyd-Hydrocort-Nystatin (FIRST-DUKES MOUTHWASH) SUSP 5-60ml QID prn - swish - swallow or spit  240 mL  1  . fluconazole (DIFLUCAN) 100 MG tablet Take 1 tablet (100 mg total) by mouth daily.  7 tablet  1  . lidocaine-prilocaine (EMLA) cream Apply topically as needed. Apply over port site 1-2 hours before chemo and cover with plastic wrap  30 g  4  . LORazepam (ATIVAN) 0.5 MG tablet Take 1 tablet (0.5 mg total) by mouth at bedtime as needed for anxiety.  30 tablet  2  . omeprazole (PRILOSEC) 40 MG capsule Take 1 capsule (40 mg total) by mouth daily.  30 capsule  5  . ondansetron (ZOFRAN) 8 MG tablet Take 1 tablet (8 mg total) by mouth every 12 (twelve) hours as needed for nausea.  20 tablet  4  . prochlorperazine (COMPAZINE) 10 MG tablet Take 1 tablet (10 mg total) by mouth every 6 (six) hours as needed.  30 tablet  4  . simvastatin (ZOCOR) 40 MG tablet Take 40 mg by mouth daily.      Marland Kitchen tobramycin-dexamethasone (TOBRADEX) ophthalmic solution PLACE 1 DROP INTO BOTH EYES EVERY DAY  5 mL  0   No current facility-administered medications for this visit.    OBJECTIVE: Middle-aged white woman in no acute distress Filed Vitals:   02/28/13 1018  BP: 120/76  Pulse: 120  Temp: 98.2 F (36.8 C)  Resp: 20     Body  mass index is 29.8 kg/(m^2).    ECOG FS: 0 Filed Weights   02/28/13 1018  Weight: 173 lb 11.2 oz (78.79 kg)   Sclerae unicteric Oropharynx clear No cervical or supraclavicular adenopathy Lungs clear to auscultation with no rales or rhonchi Heart regular rate and rhythm Abdomen soft, nontender, positive bowel sounds MSK no focal spinal tenderness No peripheral edema Neuro: nonfocal, well oriented with positive affect Breasts: I do not palpate a definable mass in the right breast. There is no nipple retraction or skin change of concern. The right axilla is benign. The left breast is unremarkable   LAB RESULTS: CBC    Component Value Date/Time   WBC 6.4 02/28/2013 1009   RBC 3.39* 02/28/2013 1009   HGB 9.7* 02/28/2013 1009   HCT 28.7* 02/28/2013 1009   PLT 238 02/28/2013 1009   MCV 84.7 02/28/2013 1009  MCH 28.6 02/28/2013 1009   MCHC 33.8 02/28/2013 1009   RDW 18.8* 02/28/2013 1009   LYMPHSABS 0.6* 02/28/2013 1009   MONOABS 0.1 02/28/2013 1009   EOSABS 0.0 02/28/2013 1009   BASOSABS 0.0 02/28/2013 1009       Chemistry      Component Value Date/Time   NA 138 02/07/2013 0926   K 4.5 02/07/2013 0926   CL 105 02/07/2013 0926   CO2 24 02/07/2013 0926   BUN 24.3 02/07/2013 0926   BUN 15 08/23/2010 1350   CREATININE 1.1 02/07/2013 0926   CREATININE 1.00 08/23/2010 1350      Component Value Date/Time   CALCIUM 9.6 02/07/2013 0926       Lab Results  Component Value Date   LABCA2 23 12/07/2012    STUDIES: Mr Breast Bilateral W Wo Contrast  02/27/2013  *RADIOLOGY REPORT*  Clinical Data: Known invasive ductal carcinoma in the 5 o'clock region of the right breast.  Assess response to neoadjuvant chemotherapy.  BILATERAL BREAST MRI WITH AND WITHOUT CONTRAST  Technique: Multiplanar, multisequence MR images of both breasts were obtained prior to and following the intravenous administration of 16ml of multihance.  Three dimensional images were evaluated at the independent DynaCad workstation.  Comparison:   Mammograms dated 11/15/2012 and 11/02/2011.  MRI dated 11/22/2012.  Findings: There is minimal background parenchymal enhancement pattern.  There  is  no abnormal enhancement in the left breast.   Left-sided Port-A-Cath.  Far posteriorly in the 5 o'clock region of the right breast there has been significant interval improvement in the intensity and size of enhancement in the known area of invasive ductal carcinoma.  3.1 x 1.9 x 1.4 cm (anterior-posterior, transverse and longitudinal dimensions) non mass-like enhancement is now visualized. Previously a 3.2 x 3.1 x 3.1 cm irregular mass was described.  The enhancement is associated with a biopsy clip artifact.  No discernible fat plane is identified between the mass and the pectoralis major muscle.  There is no enlarged axillary or internal mammary adenopathy.  IMPRESSION: Interval response of the known right breast invasive ductal carcinoma to neoadjuvant chemotherapy, as described above.  RECOMMENDATION: Treatment plan.  THREE-DIMENSIONAL MR IMAGE RENDERING ON INDEPENDENT WORKSTATION:  Three-dimensional MR images were rendered by post-processing of the original MR data on an independent workstation.  The three- dimensional MR images were interpreted, and findings were reported in the accompanying complete MRI report for this study.  BI-RADS CATEGORY 6:  Known biopsy-proven malignancy - appropriate action should be taken.   Original Report Authenticated By: Baird Lyons, M.D.     ASSESSMENT: 62 y.o. [place] woman   (1)  s/p right breast biopsy 11/15/2012 for a clinical T2 N0, stage IIA invasive ductal carcinoma, grade 3, with superficial involvement of the pectoralis muscle, estrogen receptor 29% positive, progesterone receptor negative, with an Mib-1 of 79% and HER-2 amplification by CISH with a ratio of 4.39  (2)  being treated in the neoadjuvant setting, the plan being to complete a total of 6 q. three-week doses of docetaxel/carboplatin/trastuzumab prior  to definitive surgery. Trastuzumab will then be continued for total of one year.  PLAN:  Annaleia is halfway through her chemotherapy and the MRI already is showing an excellent response. I am hopeful we will confirm that pathologically when she gets to surgery. She will receive her fourth cycle today, so her final chemotherapy day will be may 15. She is hoping to be able to have her surgery early June.  I suggested she extend  the steroids a little, and on days 5 and 6 take 2 mg of dexamethasone with breakfast. I think this is all she may need to do so she has a slightly better functional status that half week. She will let us know if that works and she will call for any other problems that may develop before the next visit here.  MAGRINAT,GUSTAV C    02/28/2013

## 2013-03-01 ENCOUNTER — Ambulatory Visit (HOSPITAL_BASED_OUTPATIENT_CLINIC_OR_DEPARTMENT_OTHER): Payer: 59

## 2013-03-01 VITALS — BP 138/82 | HR 99 | Temp 97.7°F

## 2013-03-01 DIAGNOSIS — Z5189 Encounter for other specified aftercare: Secondary | ICD-10-CM

## 2013-03-01 DIAGNOSIS — C50319 Malignant neoplasm of lower-inner quadrant of unspecified female breast: Secondary | ICD-10-CM

## 2013-03-01 DIAGNOSIS — C50911 Malignant neoplasm of unspecified site of right female breast: Secondary | ICD-10-CM

## 2013-03-01 MED ORDER — PEGFILGRASTIM INJECTION 6 MG/0.6ML
6.0000 mg | Freq: Once | SUBCUTANEOUS | Status: AC
  Administered 2013-03-01: 6 mg via SUBCUTANEOUS
  Filled 2013-03-01: qty 0.6

## 2013-03-06 ENCOUNTER — Ambulatory Visit: Payer: 59 | Admitting: Physician Assistant

## 2013-03-06 ENCOUNTER — Other Ambulatory Visit: Payer: 59 | Admitting: Lab

## 2013-03-06 ENCOUNTER — Telehealth: Payer: Self-pay | Admitting: Oncology

## 2013-03-11 ENCOUNTER — Encounter: Payer: Self-pay | Admitting: Physician Assistant

## 2013-03-11 ENCOUNTER — Other Ambulatory Visit (HOSPITAL_BASED_OUTPATIENT_CLINIC_OR_DEPARTMENT_OTHER): Payer: 59 | Admitting: Lab

## 2013-03-11 ENCOUNTER — Telehealth: Payer: Self-pay | Admitting: Oncology

## 2013-03-11 ENCOUNTER — Telehealth: Payer: Self-pay | Admitting: *Deleted

## 2013-03-11 ENCOUNTER — Ambulatory Visit (HOSPITAL_BASED_OUTPATIENT_CLINIC_OR_DEPARTMENT_OTHER): Payer: 59 | Admitting: Physician Assistant

## 2013-03-11 VITALS — BP 114/77 | HR 110 | Temp 98.5°F | Resp 20 | Ht 64.0 in | Wt 169.4 lb

## 2013-03-11 DIAGNOSIS — C50911 Malignant neoplasm of unspecified site of right female breast: Secondary | ICD-10-CM

## 2013-03-11 DIAGNOSIS — G609 Hereditary and idiopathic neuropathy, unspecified: Secondary | ICD-10-CM

## 2013-03-11 DIAGNOSIS — Z170 Estrogen receptor positive status [ER+]: Secondary | ICD-10-CM

## 2013-03-11 DIAGNOSIS — R197 Diarrhea, unspecified: Secondary | ICD-10-CM

## 2013-03-11 DIAGNOSIS — C50319 Malignant neoplasm of lower-inner quadrant of unspecified female breast: Secondary | ICD-10-CM

## 2013-03-11 LAB — CBC WITH DIFFERENTIAL/PLATELET
BASO%: 0.3 % (ref 0.0–2.0)
Basophils Absolute: 0 10*3/uL (ref 0.0–0.1)
EOS%: 0.2 % (ref 0.0–7.0)
HCT: 28.6 % — ABNORMAL LOW (ref 34.8–46.6)
HGB: 9.7 g/dL — ABNORMAL LOW (ref 11.6–15.9)
LYMPH%: 18.4 % (ref 14.0–49.7)
MCH: 29.7 pg (ref 25.1–34.0)
MCHC: 34.1 g/dL (ref 31.5–36.0)
MCV: 87.1 fL (ref 79.5–101.0)
MONO%: 7.1 % (ref 0.0–14.0)
NEUT#: 6.9 10*3/uL — ABNORMAL HIGH (ref 1.5–6.5)
NEUT%: 74 % (ref 38.4–76.8)
Platelets: 149 10*3/uL (ref 145–400)
WBC: 9.4 10*3/uL (ref 3.9–10.3)
lymph#: 1.7 10*3/uL (ref 0.9–3.3)

## 2013-03-11 LAB — COMPREHENSIVE METABOLIC PANEL (CC13)
AST: 17 U/L (ref 5–34)
BUN: 14.7 mg/dL (ref 7.0–26.0)
Calcium: 9.1 mg/dL (ref 8.4–10.4)
Chloride: 102 mEq/L (ref 98–107)
Creatinine: 1 mg/dL (ref 0.6–1.1)
Total Bilirubin: 0.69 mg/dL (ref 0.20–1.20)

## 2013-03-11 MED ORDER — CHOLESTYRAMINE 4 G PO PACK: 1.0000 | PACK | Freq: Two times a day (BID) | ORAL | Status: DC

## 2013-03-11 NOTE — Progress Notes (Signed)
ID: [name]   DOB: [birth year]  MR#: [id number]  JYN#:[id number]  PCP: Mila Palmer GYN: Meredeth Ide SU: Cicero Duck OTHER MD: Arvilla Meres, Tahjay Binion [place], Rick Cornella   HISTORY OF PRESENT ILLNESS: Jady herself palpated a mass in her right breast 11/09/2012. She brought this to Dr. Harlene Salts attention and mammography was obtained at [facility], (I do not have that report today) confirming a mass in the lower inner quadrant of the right breast. 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  Bilateral breast MRI 11/22/2012 showed a far posterior right breast mass measuring 3.2 cm with no discernible fat plane between the mass and the underlying pectoralis major. There was extension of irregular enhancement into the superficial aspect of the cholesterol is muscle underlying the mass, but there was no evidence for chest wall involvement, internal mammary artery chain or axillary lymph node involvement, or anything else in the breast or the contralateral breast.  The patient's subsequent history is as detailed below  INTERVAL HISTORY: Shatarra returns today for followup of her locally advanced right breast carcinoma accompanied by her husband [name].. She is currently day 61 cycle 4 of 6 planned cycles of docetaxel/ carboplatin/ trastuzumab being given in the neoadjuvant setting. She receives Neulasta on day 2 for granulocyte support.  Cycle 4 has been much harder for Venise to tolerate, and has taken her longer to recover following this cycle. She has noticed increased neuropathy, complaining that her feet are now tingling "all the time."  Fortunately, this is not affecting her walking or any of her day-to-day activities, but we will need to follow this very closely.  Angelina is biggest complaint today, however, is  increased diarrhea which is been a significant problem for the past week. She's been utilizing Imodium with minimal relief. The bowel movements are watery, also containing some mucous, and occasionally with mild rectal bleeding. She has some abdominal cramping, but fortunately denies any fevers or chills. She's had no nausea or emesis.  REVIEW OF SYSTEMS: Genavive denies any rashes or skin changes. She's had no additional signs of abnormal bleeding. She's had no cough, phlegm production, chest pain, or palpitations. She does have some shortness of breath with exertion which has not increased. She denies any abnormal headaches or dizziness. Her legs feel weak and achy, but she denies any additional myalgias, arthralgias, or bony pain. She's had no peripheral swelling.  A detailed review of systems is otherwise noncontributory.   PAST MEDICAL HISTORY: Past Medical History  Diagnosis Date  . Cancer     breast  . Hypertension   . Breast cancer     PAST SURGICAL HISTORY: Past Surgical History  Procedure Laterality Date  . Portacath placement  12/20/2012    Procedure: INSERTION PORT-A-CATH;  Surgeon: Currie Paris, MD;  Location: [facility] OR;  Service: General;  Laterality: N/A;    FAMILY HISTORY Family History  Problem Relation Age of Onset  . Breast cancer Mother 52  . Heart disease Father   . Bladder Cancer Father 63  . Breast cancer Maternal Aunt     diagnosed in her 102s  . Multiple sclerosis Paternal Aunt   . Heart disease Maternal Grandmother   . Mental retardation Cousin     maternal cousins; brothers, sons of aunt with breast cancer   the patient's father immigrated from  [place] 1938, living in [place] for about 10 years. He had significant coronary artery disease, bladder cancer, and a ruptured aneurysm, but lived to be 91. The patient's mother is living at age 11. The patient has one brother, no sisters. The patient's mother had breast cancer diagnosed at age 8. The patient is of a  Ashkenazi Jewish descent. She will be meeting with our genetics counselor today  GYNECOLOGIC HISTORY: Menarche age 41, first live birth age 66, she is GX P2, menopause 2006, received hormone replacement until approximately 2010.  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 This involves a lot of traveling. Son [name] lives in [place] and is a sports Banker. Daughter [name] lives in [place], where she is a Gaffer in international developments and is also getting an MPH in Development worker, international aid. The patient has no grandchildren.   ADVANCED DIRECTIVES: In place  HEALTH MAINTENANCE: History  Substance Use Topics  . Smoking status: Former Smoker -- 1.00 packs/day for 4 years    Types: Cigarettes    Quit date: 11/30/1974  . Smokeless tobacco: Never Used  . Alcohol Use: Yes     Comment: glass of wine on weekend   Aliegha exercises 3-4 times a week, doing weights, walking, and other structures exercises.   Colonoscopy: 2009/ [facility]  PAP: 2013  Bone density: 2010  Lipid panel:  No Known Allergies  Current Outpatient Prescriptions  Medication Sig Dispense Refill  . dexamethasone (DECADRON) 4 MG tablet Take 2 tablets (8 mg total) by mouth 2 (two) times daily with a meal.  30 tablet  4  . Diphenhyd-Hydrocort-Nystatin (FIRST-DUKES MOUTHWASH) SUSP 5-41ml QID prn - swish - swallow or spit  240 mL  1  . fluconazole (DIFLUCAN) 100 MG tablet Take 1 tablet (100 mg total) by mouth daily.  7 tablet  1  . lidocaine-prilocaine (EMLA) cream Apply topically as needed. Apply over port site 1-2 hours before chemo and cover with plastic wrap  30 g  4  . LORazepam (ATIVAN) 0.5 MG tablet Take 1 tablet (0.5 mg total) by mouth at bedtime as needed for anxiety.  30 tablet  2  . omeprazole (PRILOSEC) 40 MG capsule Take 1 capsule (40 mg total) by mouth daily.  30 capsule  5  . ondansetron  (ZOFRAN) 8 MG tablet Take 1 tablet (8 mg total) by mouth every 12 (twelve) hours as needed for nausea.  20 tablet  4  . prochlorperazine (COMPAZINE) 10 MG tablet Take 1 tablet (10 mg total) by mouth every 6 (six) hours as needed.  30 tablet  4  . simvastatin (ZOCOR) 40 MG tablet Take 40 mg by mouth daily.      Marland Kitchen tobramycin-dexamethasone (TOBRADEX) ophthalmic solution PLACE 1 DROP INTO BOTH EYES EVERY DAY  5 mL  0  . cholestyramine (QUESTRAN) 4 G packet Take 1 packet by mouth 2 (two) times daily with a meal.  60 each  1   No current facility-administered medications for this visit.    OBJECTIVE: Middle-aged white woman who appears tired but is in no acute distress Filed Vitals:   03/11/13 1146  BP: 114/77  Pulse: 110  Temp: 98.5 F (36.9 C)  Resp: 20     Body mass index is 29.06 kg/(m^2).    ECOG FS: 0 Filed Weights   03/11/13 1146  Weight: 169 lb 6.4 oz (76.839 kg)   Sclerae unicteric  Oropharynx clear, buccal mucosa is moist No cervical or supraclavicular adenopathy Lungs clear to auscultation with no rales or rhonchi Heart regular rate and rhythm Abdomen soft, nontender, positive bowel sounds MSK no focal spinal tenderness No peripheral edema Neuro: nonfocal, well oriented with tired affect Breasts:  Deferred.  Axillae are benign bilaterally with no palpable adenopathy noted.    LAB RESULTS: CBC    Component Value Date/Time   WBC 9.4 03/11/2013 1117   RBC 3.28* 03/11/2013 1117   HGB 9.7* 03/11/2013 1117   HCT 28.6* 03/11/2013 1117   PLT 149 03/11/2013 1117   MCV 87.1 03/11/2013 1117   MCH 29.7 03/11/2013 1117   MCHC 34.1 03/11/2013 1117   RDW 20.0* 03/11/2013 1117   LYMPHSABS 1.7 03/11/2013 1117   MONOABS 0.7 03/11/2013 1117   EOSABS 0.0 03/11/2013 1117   BASOSABS 0.0 03/11/2013 1117       Chemistry      Component Value Date/Time   NA 140 03/11/2013 1117   K 3.7 03/11/2013 1117   CL 102 03/11/2013 1117   CO2 28 03/11/2013 1117   BUN 14.7 03/11/2013 1117   BUN 15  08/23/2010 1350   CREATININE 1.0 03/11/2013 1117   CREATININE 1.00 08/23/2010 1350      Component Value Date/Time   CALCIUM 9.1 03/11/2013 1117       Lab Results  Component Value Date   LABCA2 23 12/07/2012    STUDIES: Mr Breast Bilateral W Wo Contrast  02/27/2013  *RADIOLOGY REPORT*  Clinical Data: Known invasive ductal carcinoma in the 5 o'clock region of the right breast.  Assess response to neoadjuvant chemotherapy.  BILATERAL BREAST MRI WITH AND WITHOUT CONTRAST  Technique: Multiplanar, multisequence MR images of both breasts were obtained prior to and following the intravenous administration of 16ml of multihance.  Three dimensional images were evaluated at the independent DynaCad workstation.  Comparison:  Mammograms dated 11/15/2012 and 11/02/2011.  MRI dated 11/22/2012.  Findings: There is minimal background parenchymal enhancement pattern.  There  is  no abnormal enhancement in the left breast.   Left-sided Port-A-Cath.  Far posteriorly in the 5 o'clock region of the right breast there has been significant interval improvement in the intensity and size of enhancement in the known area of invasive ductal carcinoma.  3.1 x 1.9 x 1.4 cm (anterior-posterior, transverse and longitudinal dimensions) non mass-like enhancement is now visualized. Previously a 3.2 x 3.1 x 3.1 cm irregular mass was described.  The enhancement is associated with a biopsy clip artifact.  No discernible fat plane is identified between the mass and the pectoralis major muscle.  There is no enlarged axillary or internal mammary adenopathy.  IMPRESSION: Interval response of the known right breast invasive ductal carcinoma to neoadjuvant chemotherapy, as described above.  RECOMMENDATION: Treatment plan.  THREE-DIMENSIONAL MR IMAGE RENDERING ON INDEPENDENT WORKSTATION:  Three-dimensional MR images were rendered by post-processing of the original MR data on an independent workstation.  The three- dimensional MR images were  interpreted, and findings were reported in the accompanying complete MRI report for this study.  BI-RADS CATEGORY 6:  Known biopsy-proven malignancy - appropriate action should be taken.   Original Report Authenticated By: Baird Lyons, M.D.     ASSESSMENT: 62 y.o. [place] woman   (1)  s/p right breast biopsy 11/15/2012 for a clinical T2 N0, stage IIA invasive ductal carcinoma, grade 3, with superficial involvement of the pectoralis muscle, estrogen receptor 29% positive, progesterone receptor negative, with an Mib-1 of 79% and HER-2  amplification by CISH with a ratio of 4.39  (2)  being treated in the neoadjuvant setting, the plan being to complete a total of 6 q. three-week doses of docetaxel/carboplatin/trastuzumab prior to definitive surgery. Trastuzumab will then be continued for total of one year.  PLAN:  With the increase in her diarrhea, I feel it would be prudent to check a C. Diff toxin today, and I am prescribing Questran powder twice daily in the place of Imodium.  I encouraged Malia to force fluids. I also asked her to contact us with an update in the next few days.  Shakeela will see Dr. Jamey Ripa next week on April 22 to discuss her definitive surgery. I will see her on April 24 anticipation of her fifth dose of chemotherapy. We'll continue to follow her very closely, especially for any signs of increased peripheral neuropathy.  Zeya voices understanding and agreement with this plan, and will call any changes or problems.   Journee Bobrowski    03/11/2013

## 2013-03-11 NOTE — Telephone Encounter (Signed)
Per staff phone call and POF I have schedueld appts.  JMW  

## 2013-03-12 ENCOUNTER — Ambulatory Visit: Payer: 59 | Admitting: Lab

## 2013-03-12 DIAGNOSIS — R197 Diarrhea, unspecified: Secondary | ICD-10-CM

## 2013-03-12 DIAGNOSIS — C50911 Malignant neoplasm of unspecified site of right female breast: Secondary | ICD-10-CM

## 2013-03-19 ENCOUNTER — Ambulatory Visit (INDEPENDENT_AMBULATORY_CARE_PROVIDER_SITE_OTHER): Payer: 59 | Admitting: Surgery

## 2013-03-19 ENCOUNTER — Encounter (INDEPENDENT_AMBULATORY_CARE_PROVIDER_SITE_OTHER): Payer: Self-pay | Admitting: Surgery

## 2013-03-19 VITALS — BP 136/90 | HR 112 | Temp 96.8°F | Ht 64.0 in | Wt 176.2 lb

## 2013-03-19 DIAGNOSIS — C50919 Malignant neoplasm of unspecified site of unspecified female breast: Secondary | ICD-10-CM

## 2013-03-19 DIAGNOSIS — C50911 Malignant neoplasm of unspecified site of right female breast: Secondary | ICD-10-CM

## 2013-03-19 NOTE — Patient Instructions (Signed)
Call or send [place] a message next week and we will schedule surgery for about 3 or 4 weeks after your last chemotherapy treatment. Currently, that looks like about the second week of June for surgery. We'll plan to do a lumpectomy and removal of sentinel lymph nodes as an outpatient.

## 2013-03-19 NOTE — Progress Notes (Signed)
NAME: Anita Randolph       DOB: 1950/12/07           DATE: 03/19/2013       ZOX:[id number]  CC:  Chief Complaint  Patient presents with  . Follow-up    F/U Neoadjuvant    HPI: this patient was diagnosed with a right breast cancer in the lower inner quadrant which appear to perhaps invade the pectoralis muscle. It was receptor positive. She is currently under going chemotherapy. She no longer feels the palpable mass it was there originally. She had an MRI recently which shows improvement in 2 of the 3 dimensions noted in the original measurement. She says she is tolerating her chemotherapy well. She comes in to review plans for surgery once she has completed chemotherapy. She has 2 treatments remaining, the first of the 2 will be in 2 days. She overall feels healthy. She does not have any breast symptoms or problems.  EXAM: Vital signs: BP 136/90  Pulse 112  Temp(Src) 96.8 F (36 C) (Temporal)  Ht 5\' 4"  (1.626 m)  Wt 176 lb 3.2 oz (79.924 kg)  BMI 30.23 kg/m2  SpO2 98%  General: Patient alert, oriented, NAD  Breasts: There is no palpable mass in the right breast a longer period I can easily elevate the breast tissue from the pectoralis muscle. There is no other abnormality noted. Left breast is normal.  Lymphatics: There is no axillary adenopathy noted  Data reviewed: I reviewed over the MRI reports.   IMP: stage II right breast cancer lower inner quadrant with possible pectoral invasion, responding to chemotherapy.  PLAN: I reviewed options for treatment. I think she is an excellent candidate for a lumpectomy with radiation therapy. She'll need a sentinel lymph node evaluation. I discussed the indications risks and complications of the surgery. I talked about the possible need for going back for additional margins and the possibly for a mastectomy. I told her I would like to do this 3-4 weeks after her final chemotherapy. She will call us next week to let us known how the next  chemotherapy dose and if all is well we'll go ahead and schedule her for a wire localized right lumpectomy with sentinel lymph node dissection. I will see her sometime preoperatively to review the issues one final time but we'll going ahead and make the surgical scheduling arrangements next week  Tionne Dayhoff J 03/19/2013

## 2013-03-21 ENCOUNTER — Ambulatory Visit (HOSPITAL_BASED_OUTPATIENT_CLINIC_OR_DEPARTMENT_OTHER): Payer: 59 | Admitting: Physician Assistant

## 2013-03-21 ENCOUNTER — Ambulatory Visit (HOSPITAL_BASED_OUTPATIENT_CLINIC_OR_DEPARTMENT_OTHER): Payer: 59

## 2013-03-21 ENCOUNTER — Other Ambulatory Visit (HOSPITAL_BASED_OUTPATIENT_CLINIC_OR_DEPARTMENT_OTHER): Payer: 59 | Admitting: Lab

## 2013-03-21 ENCOUNTER — Other Ambulatory Visit: Payer: 59 | Admitting: Lab

## 2013-03-21 ENCOUNTER — Encounter: Payer: Self-pay | Admitting: Physician Assistant

## 2013-03-21 VITALS — BP 128/84 | HR 116 | Temp 98.3°F | Resp 20 | Ht 64.0 in | Wt 175.2 lb

## 2013-03-21 DIAGNOSIS — C50319 Malignant neoplasm of lower-inner quadrant of unspecified female breast: Secondary | ICD-10-CM

## 2013-03-21 DIAGNOSIS — Z5111 Encounter for antineoplastic chemotherapy: Secondary | ICD-10-CM

## 2013-03-21 DIAGNOSIS — C50911 Malignant neoplasm of unspecified site of right female breast: Secondary | ICD-10-CM

## 2013-03-21 DIAGNOSIS — Z5112 Encounter for antineoplastic immunotherapy: Secondary | ICD-10-CM

## 2013-03-21 LAB — COMPREHENSIVE METABOLIC PANEL (CC13)
Albumin: 3.4 g/dL — ABNORMAL LOW (ref 3.5–5.0)
BUN: 22.6 mg/dL (ref 7.0–26.0)
Calcium: 9.3 mg/dL (ref 8.4–10.4)
Chloride: 106 mEq/L (ref 98–107)
Glucose: 141 mg/dl — ABNORMAL HIGH (ref 70–99)
Potassium: 4.1 mEq/L (ref 3.5–5.1)

## 2013-03-21 LAB — CBC WITH DIFFERENTIAL/PLATELET
BASO%: 0.1 % (ref 0.0–2.0)
Basophils Absolute: 0 10e3/uL (ref 0.0–0.1)
EOS%: 0 % (ref 0.0–7.0)
Eosinophils Absolute: 0 10e3/uL (ref 0.0–0.5)
HCT: 29 % — ABNORMAL LOW (ref 34.8–46.6)
HGB: 9.3 g/dL — ABNORMAL LOW (ref 11.6–15.9)
LYMPH%: 10 % — ABNORMAL LOW (ref 14.0–49.7)
MCH: 29 pg (ref 25.1–34.0)
MCHC: 32.1 g/dL (ref 31.5–36.0)
MCV: 90.3 fL (ref 79.5–101.0)
MONO#: 0.3 10e3/uL (ref 0.1–0.9)
MONO%: 3.3 % (ref 0.0–14.0)
NEUT#: 8.8 10e3/uL — ABNORMAL HIGH (ref 1.5–6.5)
NEUT%: 86.6 % — ABNORMAL HIGH (ref 38.4–76.8)
Platelets: 307 10e3/uL (ref 145–400)
RBC: 3.21 10e6/uL — ABNORMAL LOW (ref 3.70–5.45)
RDW: 19.2 % — ABNORMAL HIGH (ref 11.2–14.5)
WBC: 10.2 10e3/uL (ref 3.9–10.3)
lymph#: 1 10e3/uL (ref 0.9–3.3)
nRBC: 0 % (ref 0–0)

## 2013-03-21 MED ORDER — TRASTUZUMAB CHEMO INJECTION 440 MG
6.0000 mg/kg | Freq: Once | INTRAVENOUS | Status: AC
  Administered 2013-03-21: 462 mg via INTRAVENOUS
  Filled 2013-03-21: qty 22

## 2013-03-21 MED ORDER — SODIUM CHLORIDE 0.9 % IV SOLN
600.0000 mg | Freq: Once | INTRAVENOUS | Status: AC
  Administered 2013-03-21: 600 mg via INTRAVENOUS
  Filled 2013-03-21: qty 60

## 2013-03-21 MED ORDER — DEXAMETHASONE SODIUM PHOSPHATE 4 MG/ML IJ SOLN
20.0000 mg | Freq: Once | INTRAMUSCULAR | Status: AC
  Administered 2013-03-21: 20 mg via INTRAVENOUS

## 2013-03-21 MED ORDER — HEPARIN SOD (PORK) LOCK FLUSH 100 UNIT/ML IV SOLN
500.0000 [IU] | Freq: Once | INTRAVENOUS | Status: AC | PRN
  Administered 2013-03-21: 500 [IU]
  Filled 2013-03-21: qty 5

## 2013-03-21 MED ORDER — SODIUM CHLORIDE 0.9 % IV SOLN
75.0000 mg/m2 | Freq: Once | INTRAVENOUS | Status: AC
  Administered 2013-03-21: 140 mg via INTRAVENOUS
  Filled 2013-03-21: qty 14

## 2013-03-21 MED ORDER — ACETAMINOPHEN 325 MG PO TABS
650.0000 mg | ORAL_TABLET | Freq: Once | ORAL | Status: AC
  Administered 2013-03-21: 650 mg via ORAL

## 2013-03-21 MED ORDER — DIPHENHYDRAMINE HCL 25 MG PO CAPS
25.0000 mg | ORAL_CAPSULE | Freq: Once | ORAL | Status: AC
  Administered 2013-03-21: 25 mg via ORAL

## 2013-03-21 MED ORDER — SODIUM CHLORIDE 0.9 % IJ SOLN
10.0000 mL | INTRAMUSCULAR | Status: DC | PRN
  Administered 2013-03-21: 10 mL
  Filled 2013-03-21: qty 10

## 2013-03-21 MED ORDER — SODIUM CHLORIDE 0.9 % IV SOLN
Freq: Once | INTRAVENOUS | Status: AC
  Administered 2013-03-21: 12:00:00 via INTRAVENOUS

## 2013-03-21 MED ORDER — ONDANSETRON 16 MG/50ML IVPB (CHCC)
16.0000 mg | Freq: Once | INTRAVENOUS | Status: AC
  Administered 2013-03-21: 16 mg via INTRAVENOUS

## 2013-03-21 NOTE — Patient Instructions (Signed)
[facility] Discharge Instructions for Patients Receiving Chemotherapy  Today you received the following chemotherapy agents :  Taxotere,  Carboplatin,  Herceptin. To help prevent nausea and vomiting after your treatment, we encourage you to take your nausea medication as instructed by your physician.    If you develop nausea and vomiting that is not controlled by your nausea medication, call the clinic. If it is after clinic hours your family physician or the after hours number for the clinic or go to the Emergency Department.   BELOW ARE SYMPTOMS THAT SHOULD BE REPORTED IMMEDIATELY:  *FEVER GREATER THAN 100.5 F  *CHILLS WITH OR WITHOUT FEVER  NAUSEA AND VOMITING THAT IS NOT CONTROLLED WITH YOUR NAUSEA MEDICATION  *UNUSUAL SHORTNESS OF BREATH  *UNUSUAL BRUISING OR BLEEDING  TENDERNESS IN MOUTH AND THROAT WITH OR WITHOUT PRESENCE OF ULCERS  *URINARY PROBLEMS  *BOWEL PROBLEMS  UNUSUAL RASH Items with * indicate a potential emergency and should be followed up as soon as possible.  One of the nurses will contact you 24 hours after your treatment. Please let the nurse know about any problems that you may have experienced. Feel free to call the clinic you have any questions or concerns. The clinic phone number is [phone].   I have been informed and understand all the instructions given to me. I know to contact the clinic, my physician, or go to the Emergency Department if any problems should occur. I do not have any questions at this time, but understand that I may call the clinic during office hours   should I have any questions or need assistance in obtaining follow up care.    __________________________________________  _____________  __________ Signature of Patient or Authorized Representative            Date                   Time    __________________________________________ Nurse's Signature    

## 2013-03-21 NOTE — Progress Notes (Signed)
ID: MASA LUBIN   DOB: August 30, 1951  MR#: [id number]  WUJ#:[id number]  PCP: Mila Palmer GYN: Meredeth Ide SU: Cicero Duck OTHER MD: Arvilla Meres, Seyed Heffley [place], Rick Cornella   HISTORY OF PRESENT ILLNESS: Anita Randolph herself palpated a mass in her right breast 11/09/2012. She brought this to Dr. Harlene Salts attention and mammography was obtained at [facility], (I do not have that report today) confirming a mass in the lower inner quadrant of the right breast. 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  Bilateral breast MRI 11/22/2012 showed a far posterior right breast mass measuring 3.2 cm with no discernible fat plane between the mass and the underlying pectoralis major. There was extension of irregular enhancement into the superficial aspect of the cholesterol is muscle underlying the mass, but there was no evidence for chest wall involvement, internal mammary artery chain or axillary lymph node involvement, or anything else in the breast or the contralateral breast.  The patient's subsequent history is as detailed below  INTERVAL HISTORY: Anita Randolph returns today for followup of her locally advanced right breast carcinoma. She is due for day 1 cycle 5 of 6 planned cycles of docetaxel/ carboplatin/ trastuzumab being given in the neoadjuvant setting. She receives Neulasta on day 2 for granulocyte support.  Anita Randolph has recovered well from cycle 4, and is now having only some occasional loose bowel movements. She is utilizing Questran and Imodium with relief. She continues to feel tired, but was able to work last week. She still has some numbness and tingling in her feet that "comes and goes" and is not affecting any of her day-to-day activities.   REVIEW OF SYSTEMS: Anita Randolph denies any fevers, chills, or night sweats. She has had  no rashes or skin changes. She's had no abnormal bruising or bleeding. She's had no cough, phlegm production, orthopnea, chest pain, or palpitations. She does have some shortness of breath with exertion which has not increased. She denies any abnormal headaches or dizziness.  She denies any significant  myalgias, arthralgias, or bony pain. She's had no peripheral swelling.  A detailed review of systems is otherwise noncontributory.   PAST MEDICAL HISTORY: Past Medical History  Diagnosis Date  . Cancer     breast  . Hypertension   . Breast cancer     PAST SURGICAL HISTORY: Past Surgical History  Procedure Laterality Date  . Portacath placement  12/20/2012    Procedure: INSERTION PORT-A-CATH;  Surgeon: Currie Paris, MD;  Location: [facility] OR;  Service: General;  Laterality: N/A;    FAMILY HISTORY Family History  Problem Relation Age of Onset  . Breast cancer Mother 12  . Heart disease Father   . Bladder Cancer Father 106  . Breast cancer Maternal Aunt     diagnosed in her 78s  . Multiple sclerosis Paternal Aunt   . Heart disease Maternal Grandmother   . Mental retardation Cousin     maternal cousins; brothers, sons of aunt with breast cancer   the patient's father immigrated from [place] 1938, living in [place] for about 10 years. He had significant coronary artery disease, bladder cancer, and a ruptured aneurysm, but lived to be 91. The patient's mother is living at age 47. The patient has one brother, no sisters. The patient's mother had breast cancer diagnosed at age 51. The patient is of a Ashkenazi  Jewish descent. She will be meeting with our genetics counselor today  GYNECOLOGIC HISTORY: Menarche age 78, first live birth age 56, she is GX P2, menopause 2006, received hormone replacement until approximately 2010.  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 Her husband Anita Randolph has a management position for [employer]. This involves a  lot of traveling. Son Anita Randolph lives in [place] and is a sports Banker. Daughter Anita Randolph lives in [place], where she is a Gaffer in international developments and is also getting an MPH in Development worker, international aid. The patient has no grandchildren.   ADVANCED DIRECTIVES: In place  HEALTH MAINTENANCE: History  Substance Use Topics  . Smoking status: Former Smoker -- 1.00 packs/day for 4 years    Types: Cigarettes    Quit date: 11/30/1974  . Smokeless tobacco: Never Used  . Alcohol Use: Yes     Comment: glass of wine on weekend   Anita Randolph exercises 3-4 times a week, doing weights, walking, and other structures exercises.   Colonoscopy: 2009/ [facility]  PAP: 2013  Bone density: 2010  Lipid panel:  No Known Allergies  Current Outpatient Prescriptions  Medication Sig Dispense Refill  . cholestyramine (QUESTRAN) 4 G packet Take 1 packet by mouth 2 (two) times daily with a meal.  60 each  1  . dexamethasone (DECADRON) 4 MG tablet Take 2 tablets (8 mg total) by mouth 2 (two) times daily with a meal.  30 tablet  4  . Diphenhyd-Hydrocort-Nystatin (FIRST-DUKES MOUTHWASH) SUSP 5-64ml QID prn - swish - swallow or spit  240 mL  1  . fluconazole (DIFLUCAN) 100 MG tablet Take 1 tablet (100 mg total) by mouth daily.  7 tablet  1  . lidocaine-prilocaine (EMLA) cream Apply topically as needed. Apply over port site 1-2 hours before chemo and cover with plastic wrap  30 g  4  . LORazepam (ATIVAN) 0.5 MG tablet Take 1 tablet (0.5 mg total) by mouth at bedtime as needed for anxiety.  30 tablet  2  . omeprazole (PRILOSEC) 40 MG capsule Take 1 capsule (40 mg total) by mouth daily.  30 capsule  5  . ondansetron (ZOFRAN) 8 MG tablet Take 1 tablet (8 mg total) by mouth every 12 (twelve) hours as needed for nausea.  20 tablet  4  . prochlorperazine (COMPAZINE) 10 MG tablet Take 1 tablet (10 mg total) by mouth every 6 (six) hours as needed.  30 tablet  4  . simvastatin (ZOCOR) 40 MG tablet Take 40 mg  by mouth daily.      Marland Kitchen tobramycin-dexamethasone (TOBRADEX) ophthalmic solution PLACE 1 DROP INTO BOTH EYES EVERY DAY  5 mL  0   No current facility-administered medications for this visit.   Facility-Administered Medications Ordered in Other Visits  Medication Dose Route Frequency Provider Last Rate Last Dose  . CARBOplatin (PARAPLATIN) 600 mg in sodium chloride 0.9 % 250 mL chemo infusion  600 mg Intravenous Once Ruther Ephraim G Zaniya Mcaulay, PA-C      . DOCEtaxel (TAXOTERE) 140 mg in sodium chloride 0.9 % 250 mL chemo infusion  75 mg/m2 (Treatment Plan Actual) Intravenous Once Catalina Gravel, PA-C 264 mL/hr at 03/21/13 1216 140 mg at 03/21/13 1216  . heparin lock flush 100 unit/mL  500 Units Intracatheter Once PRN Vannary Greening G Charleene Callegari, PA-C      . sodium chloride 0.9 % injection 10 mL  10 mL Intracatheter PRN Kavaughn Faucett Allegra Grana, PA-C      .  trastuzumab (HERCEPTIN) 462 mg in sodium chloride 0.9 % 250 mL chemo infusion  6 mg/kg (Treatment Plan Actual) Intravenous Once Keyairra Kolinski Allegra Grana, PA-C        OBJECTIVE: Middle-aged white woman who appears comfortable and is in no acute distress Filed Vitals:   03/21/13 0938  BP: 128/84  Pulse: 116  Temp: 98.3 F (36.8 C)  Resp: 20     Body mass index is 30.06 kg/(m^2).    ECOG FS: 0 Filed Weights   03/21/13 0938  Weight: 175 lb 3.2 oz (79.47 kg)   Sclerae unicteric Oropharynx clear, no ulcerations, and no evidence of oral candidiasis No cervical or supraclavicular adenopathy Lungs clear to auscultation with no wheezes or rhonchi Heart regular rate and rhythm Abdomen soft, nontender, positive bowel sounds MSK no focal spinal tenderness No peripheral edema Neuro: nonfocal, well oriented with positive affect Breasts:  Deferred.  Axillae are benign bilaterally with no palpable adenopathy noted.    LAB RESULTS: CBC    Component Value Date/Time   WBC 10.2 03/21/2013 0924   RBC 3.21* 03/21/2013 0924   HGB 9.3* 03/21/2013 0924   HCT 29.0* 03/21/2013 0924   PLT 307 03/21/2013 0924    MCV 90.3 03/21/2013 0924   MCH 29.0 03/21/2013 0924   MCHC 32.1 03/21/2013 0924   RDW 19.2* 03/21/2013 0924   LYMPHSABS 1.0 03/21/2013 0924   MONOABS 0.3 03/21/2013 0924   EOSABS 0.0 03/21/2013 0924   BASOSABS 0.0 03/21/2013 0924       Chemistry      Component Value Date/Time   NA 138 03/21/2013 0924   K 4.1 03/21/2013 0924   CL 106 03/21/2013 0924   CO2 22 03/21/2013 0924   BUN 22.6 03/21/2013 0924   BUN 15 08/23/2010 1350   CREATININE 1.0 03/21/2013 0924   CREATININE 1.00 08/23/2010 1350      Component Value Date/Time   CALCIUM 9.3 03/21/2013 0924       Lab Results  Component Value Date   LABCA2 23 12/07/2012    STUDIES:  Most recent echocardiogram on 12/13/2012 showed an ejection fraction of 6065%.    Mr Breast Bilateral W Wo Contrast  02/27/2013  *RADIOLOGY REPORT*  Clinical Data: Known invasive ductal carcinoma in the 5 o'clock region of the right breast.  Assess response to neoadjuvant chemotherapy.  BILATERAL BREAST MRI WITH AND WITHOUT CONTRAST  Technique: Multiplanar, multisequence MR images of both breasts were obtained prior to and following the intravenous administration of 16ml of multihance.  Three dimensional images were evaluated at the independent DynaCad workstation.  Comparison:  Mammograms dated 11/15/2012 and 11/02/2011.  MRI dated 11/22/2012.  Findings: There is minimal background parenchymal enhancement pattern.  There  is  no abnormal enhancement in the left breast.   Left-sided Port-A-Cath.  Far posteriorly in the 5 o'clock region of the right breast there has been significant interval improvement in the intensity and size of enhancement in the known area of invasive ductal carcinoma.  3.1 x 1.9 x 1.4 cm (anterior-posterior, transverse and longitudinal dimensions) non mass-like enhancement is now visualized. Previously a 3.2 x 3.1 x 3.1 cm irregular mass was described.  The enhancement is associated with a biopsy clip artifact.  No discernible fat plane is identified  between the mass and the pectoralis major muscle.  There is no enlarged axillary or internal mammary adenopathy.  IMPRESSION: Interval response of the known right breast invasive ductal carcinoma to neoadjuvant chemotherapy, as described above.  RECOMMENDATION: Treatment plan.  THREE-DIMENSIONAL MR IMAGE RENDERING ON INDEPENDENT WORKSTATION:  Three-dimensional MR images were rendered by post-processing of the original MR data on an independent workstation.  The three- dimensional MR images were interpreted, and findings were reported in the accompanying complete MRI report for this study.  BI-RADS CATEGORY 6:  Known biopsy-proven malignancy - appropriate action should be taken.   Original Report Authenticated By: Baird Lyons, M.D.     ASSESSMENT: 62 y.o. [place] woman   (1)  s/p right breast biopsy 11/15/2012 for a clinical T2 N0, stage IIA invasive ductal carcinoma, grade 3, with superficial involvement of the pectoralis muscle, estrogen receptor 29% positive, progesterone receptor negative, with an Mib-1 of 79% and HER-2 amplification by CISH with a ratio of 4.39  (2)  being treated in the neoadjuvant setting, the plan being to complete a total of 6 q. three-week doses of docetaxel/carboplatin/trastuzumab prior to definitive surgery. Trastuzumab will then be continued for total of one year.  PLAN:  Anita Randolph will proceed to treatment today as scheduled for her fifth cycle of neoadjuvant docetaxel/carboplatin/trastuzumab. She we'll receive Neulasta tomorrow on day 2. She is scheduled for her repeat echocardiogram next week on April 28, and I will see her on May 1 for assessment of chemotoxicity. We'll continue to follow her very closely for increased peripheral neuropathy which at this point appears to be stable.   Anita Randolph voices understanding and agreement with this plan, and will call any changes or problems.  Anita Randolph would like to see Dr. Michell Heinrich for radiation therapy, and this appointment will likely  be scheduled for early June following her definitive surgery.   Anita Randolph    03/21/2013

## 2013-03-22 ENCOUNTER — Ambulatory Visit (HOSPITAL_BASED_OUTPATIENT_CLINIC_OR_DEPARTMENT_OTHER): Payer: 59

## 2013-03-22 ENCOUNTER — Encounter: Payer: Self-pay | Admitting: Physician Assistant

## 2013-03-22 ENCOUNTER — Encounter (INDEPENDENT_AMBULATORY_CARE_PROVIDER_SITE_OTHER): Payer: Self-pay | Admitting: Surgery

## 2013-03-22 VITALS — BP 125/75 | HR 91 | Temp 98.2°F

## 2013-03-22 DIAGNOSIS — Z5189 Encounter for other specified aftercare: Secondary | ICD-10-CM

## 2013-03-22 DIAGNOSIS — C50911 Malignant neoplasm of unspecified site of right female breast: Secondary | ICD-10-CM

## 2013-03-22 DIAGNOSIS — C50319 Malignant neoplasm of lower-inner quadrant of unspecified female breast: Secondary | ICD-10-CM

## 2013-03-22 MED ORDER — PEGFILGRASTIM INJECTION 6 MG/0.6ML
6.0000 mg | Freq: Once | SUBCUTANEOUS | Status: AC
  Administered 2013-03-22: 6 mg via SUBCUTANEOUS
  Filled 2013-03-22: qty 0.6

## 2013-03-23 ENCOUNTER — Other Ambulatory Visit: Payer: Self-pay | Admitting: Oncology

## 2013-03-25 ENCOUNTER — Encounter (HOSPITAL_COMMUNITY): Payer: Self-pay

## 2013-03-25 ENCOUNTER — Encounter: Payer: Self-pay | Admitting: Oncology

## 2013-03-25 ENCOUNTER — Ambulatory Visit (HOSPITAL_COMMUNITY)
Admission: RE | Admit: 2013-03-25 | Discharge: 2013-03-25 | Disposition: A | Discharge status: Home / Self Care | Payer: 59 | Source: Ambulatory Visit | Attending: Internal Medicine | Admitting: Internal Medicine

## 2013-03-25 ENCOUNTER — Ambulatory Visit (HOSPITAL_BASED_OUTPATIENT_CLINIC_OR_DEPARTMENT_OTHER)
Admission: RE | Admit: 2013-03-25 | Discharge: 2013-03-25 | Disposition: A | Discharge status: Home / Self Care | Payer: 59 | Source: Ambulatory Visit | Attending: Internal Medicine | Admitting: Internal Medicine

## 2013-03-25 VITALS — BP 102/78 | Wt 169.8 lb

## 2013-03-25 DIAGNOSIS — Z79899 Other long term (current) drug therapy: Secondary | ICD-10-CM | POA: Insufficient documentation

## 2013-03-25 DIAGNOSIS — C50911 Malignant neoplasm of unspecified site of right female breast: Secondary | ICD-10-CM

## 2013-03-25 DIAGNOSIS — Z09 Encounter for follow-up examination after completed treatment for conditions other than malignant neoplasm: Secondary | ICD-10-CM

## 2013-03-25 DIAGNOSIS — Z170 Estrogen receptor positive status [ER+]: Secondary | ICD-10-CM | POA: Insufficient documentation

## 2013-03-25 DIAGNOSIS — C50919 Malignant neoplasm of unspecified site of unspecified female breast: Secondary | ICD-10-CM | POA: Insufficient documentation

## 2013-03-25 DIAGNOSIS — Z87891 Personal history of nicotine dependence: Secondary | ICD-10-CM | POA: Insufficient documentation

## 2013-03-25 NOTE — Patient Instructions (Addendum)
Follow up in 3 months with an ECHO and Dr Gala Romney

## 2013-03-25 NOTE — Progress Notes (Signed)
  Echocardiogram 2D Echocardiogram has been performed.  Anita Randolph A 03/25/2013, 11:46 AM

## 2013-03-25 NOTE — Assessment & Plan Note (Addendum)
Anita Randolph presents as a new patient for the cardio-onc clinic. Explained the purpose of Cardio-Onc clinic as it relates to breast cancer. Dr Gala Romney  reviewed and discussed ECHO. EF and lateral S' stable. No evidence of cardiotoxicity. Follow up in 3 months with an ECHO and Dr Gala Romney.   Patient seen and examined with Tonye Becket, NP. We discussed all aspects of the encounter. I agree with the assessment and plan as stated above. Explained incidence of Herceptin cardiotoxicity and role of Cardio-oncology clinic at length. Echo images reviewed personally. All parameters stable. Reviewed signs and symptoms of HF to look for. Continue Herceptin. Follow-up with echo in 3 months.

## 2013-03-25 NOTE — Progress Notes (Signed)
Patient ID: Anita Randolph, female   DOB: [birth year], 62 y.o.   MRN: [id number]  PCP: Mila Palmer  GYN: Meredeth Ide  General Surgeon: Cicero Duck    HPI: Anita Randolph is a 11 year with R breast cancer diagnosed in 10/2012-invasive ductal carcinoma, grade 3, 29% estrogen receptor positive, progesterone receptor negative, hyperlipidemia and no known heart disease referred to the cardio-onc clinic by Dr Darnelle Catalan  She has been receiving neoadjuvant docetaxel/carboplatin/trastuzumab  5/6 cycles prior to definitive surgery. Trastuzumab will then be continued for a total of one year.  She currently works full time at [employer] for [employer]. Denies SOB/PND/Orthopnea. Complains of fatigue.   ECHO 12/13/12 EF 60-65% Lateral S' 7.9 ECHO 03/25/13 EF 55-60% Lateral S' 9.5  Review of Systems:     Cardiac Review of Systems: {Y] = yes [ ]  = no  Chest Pain [    ]  Resting SOB [   ] Exertional SOB  [  ]  Orthopnea [  ]   Pedal Edema [   ]    Palpitations [  ] Syncope  [  ]   Presyncope [   ]  General Review of Systems: [Y] = yes [  ]=no Constitional: recent weight change [Y  ]; anorexia [  ]; fatigue [ Y ]; nausea [  ]; night sweats [  ]; fever [  ]; or chills [  ];                                                                         Dental: poor dentition[  ];   Eye : blurred vision [  ]; diplopia [   ]; vision changes [  ];  Amaurosis fugax[  ]; Resp: cough [  ];  wheezing[  ];  hemoptysis[  ]; shortness of breath[  ]; paroxysmal nocturnal dyspnea[  ]; dyspnea on exertion[  ]; or orthopnea[  ];  GI:  gallstones[  ], vomiting[  ];  dysphagia[  ]; melena[  ];  hematochezia [  ]; heartburn[  ];  GU: kidney stones [  ]; hematuria[  ];   dysuria [  ];  nocturia[  ];  history of     obstruction [  ];                 Skin: rash, swelling[  ];, hair loss[  ];  peripheral edema[  ];  or itching[  ]; Musculosketetal: myalgias[  ];  joint swelling[  ];  joint erythema[  ];  joint pain[  ];  back pain[   ];  Heme/Lymph: bruising[  ];  bleeding[  ];  anemia[  ];  Neuro: TIA[  ];  headaches[  ];  stroke[  ];  vertigo[  ];  seizures[  ];   paresthesias[  ];  difficulty walking[  ];  Psych:depression[  ]; anxiety[  ];  Endocrine: diabetes[  ];  thyroid dysfunction[  ];  Immunizations: Flu [  ]; Pneumococcal[  ];  Other:    Past Medical History  Diagnosis Date  . Cancer     breast  . Hypertension   . Breast cancer     Current Outpatient Prescriptions  Medication Sig Dispense  Refill  . cholestyramine (QUESTRAN) 4 G packet Take 1 packet by mouth 2 (two) times daily with a meal.  60 each  1  . dexamethasone (DECADRON) 4 MG tablet Take 2 tablets (8 mg total) by mouth 2 (two) times daily with a meal.  30 tablet  4  . Diphenhyd-Hydrocort-Nystatin (FIRST-DUKES MOUTHWASH) SUSP 5-44ml QID prn - swish - swallow or spit  240 mL  1  . fluconazole (DIFLUCAN) 100 MG tablet Take 1 tablet (100 mg total) by mouth daily.  7 tablet  1  . lidocaine-prilocaine (EMLA) cream Apply topically as needed. Apply over port site 1-2 hours before chemo and cover with plastic wrap  30 g  4  . LORazepam (ATIVAN) 0.5 MG tablet Take 1 tablet (0.5 mg total) by mouth at bedtime as needed for anxiety.  30 tablet  2  . omeprazole (PRILOSEC) 40 MG capsule Take 1 capsule (40 mg total) by mouth daily.  30 capsule  5  . ondansetron (ZOFRAN) 8 MG tablet Take 1 tablet (8 mg total) by mouth every 12 (twelve) hours as needed for nausea.  20 tablet  4  . prochlorperazine (COMPAZINE) 10 MG tablet Take 1 tablet (10 mg total) by mouth every 6 (six) hours as needed.  30 tablet  4  . simvastatin (ZOCOR) 40 MG tablet Take 40 mg by mouth daily.      Marland Kitchen tobramycin-dexamethasone (TOBRADEX) ophthalmic solution PLACE 1 DROP INTO BOTH EYES EVERY DAY  5 mL  0   No current facility-administered medications for this encounter.     No Known Allergies  History   Social History  . Marital Status: Married    Spouse Name: N/A    Number of  Children: N/A  . Years of Education: N/A   Occupational History  . Not on file.   Social History Main Topics  . Smoking status: Former Smoker -- 1.00 packs/day for 4 years    Types: Cigarettes    Quit date: 11/30/1974  . Smokeless tobacco: Never Used  . Alcohol Use: Yes     Comment: glass of wine on weekend  . Drug Use: No  . Sexually Active: Yes   Other Topics Concern  . Not on file   Social History Narrative  . No narrative on file    Family History  Problem Relation Age of Onset  . Breast cancer Mother 12  . Heart disease Father   . Bladder Cancer Father 34  . Breast cancer Maternal Aunt     diagnosed in her 79s  . Multiple sclerosis Paternal Aunt   . Heart disease Maternal Grandmother   . Mental retardation Cousin     maternal cousins; brothers, sons of aunt with breast cancer    PHYSICAL EXAM: Filed Vitals:   03/25/13 1101  BP: 102/78   General:  Well appearing. No respiratory difficulty HEENT: normal Neck: supple. no JVD. Carotids 2+ bilat; no bruits. No lymphadenopathy or thryomegaly appreciated. Cor: PMI nondisplaced. Regular rate & rhythm. No rubs, gallops or murmurs. Lungs: clear Abdomen: soft, nontender, nondistended. No hepatosplenomegaly. No bruits or masses. Good bowel sounds. Extremities: no cyanosis, clubbing, rash, edema Neuro: alert & oriented x 3, cranial nerves grossly intact. moves all 4 extremities w/o difficulty. Affect pleasant.   No results found for this or any previous visit (from the past 24 hour(s)). No results found.   ASSESSMENT & PLAN:

## 2013-03-28 ENCOUNTER — Other Ambulatory Visit (HOSPITAL_BASED_OUTPATIENT_CLINIC_OR_DEPARTMENT_OTHER): Payer: 59 | Admitting: Lab

## 2013-03-28 ENCOUNTER — Encounter (INDEPENDENT_AMBULATORY_CARE_PROVIDER_SITE_OTHER): Payer: Self-pay | Admitting: Surgery

## 2013-03-28 ENCOUNTER — Telehealth: Payer: Self-pay | Admitting: Oncology

## 2013-03-28 ENCOUNTER — Ambulatory Visit (HOSPITAL_BASED_OUTPATIENT_CLINIC_OR_DEPARTMENT_OTHER): Payer: 59 | Admitting: Physician Assistant

## 2013-03-28 ENCOUNTER — Encounter: Payer: Self-pay | Admitting: Physician Assistant

## 2013-03-28 VITALS — BP 114/76 | HR 132 | Temp 98.8°F | Resp 20 | Ht 64.0 in | Wt 166.9 lb

## 2013-03-28 DIAGNOSIS — C50911 Malignant neoplasm of unspecified site of right female breast: Secondary | ICD-10-CM

## 2013-03-28 DIAGNOSIS — C50919 Malignant neoplasm of unspecified site of unspecified female breast: Secondary | ICD-10-CM

## 2013-03-28 LAB — CBC WITH DIFFERENTIAL/PLATELET
Basophils Absolute: 0 10*3/uL (ref 0.0–0.1)
EOS%: 0.2 % (ref 0.0–7.0)
Eosinophils Absolute: 0 10*3/uL (ref 0.0–0.5)
HGB: 10.5 g/dL — ABNORMAL LOW (ref 11.6–15.9)
MONO#: 1.8 10*3/uL — ABNORMAL HIGH (ref 0.1–0.9)
NEUT#: 9.9 10*3/uL — ABNORMAL HIGH (ref 1.5–6.5)
RDW: 17.8 % — ABNORMAL HIGH (ref 11.2–14.5)
WBC: 14.3 10*3/uL — ABNORMAL HIGH (ref 3.9–10.3)
lymph#: 2.5 10*3/uL (ref 0.9–3.3)

## 2013-03-28 NOTE — Progress Notes (Signed)
ID: KEYRY IRACHETA   DOB: 1951-09-06  MR#: [id number]  WUJ#:[id number]  PCP: Mila Palmer GYN: Meredeth Ide SU: Cicero Duck OTHER MD: Arvilla Meres, Kerrianne Jeng [place], Rick Cornella   HISTORY OF PRESENT ILLNESS: Braylin herself palpated a mass in her right breast 11/09/2012. She brought this to Dr. Harlene Salts attention and mammography was obtained at [facility], (I do not have that report today) confirming a mass in the lower inner quadrant of the right breast. 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  Bilateral breast MRI 11/22/2012 showed a far posterior right breast mass measuring 3.2 cm with no discernible fat plane between the mass and the underlying pectoralis major. There was extension of irregular enhancement into the superficial aspect of the cholesterol is muscle underlying the mass, but there was no evidence for chest wall involvement, internal mammary artery chain or axillary lymph node involvement, or anything else in the breast or the contralateral breast.  The patient's subsequent history is as detailed below  INTERVAL HISTORY: Carolie returns today for followup of her locally advanced right breast carcinoma. She is currently day 8 cycle 5 of 6 planned cycles of docetaxel/ carboplatin/ trastuzumab being given in the neoadjuvant setting. She receives Neulasta on day 2 for granulocyte support.  Karmyn has recovered well from cycle 5, and tells me this cycle was much easier than cycle 4. She is still tired, but this is improving. She continues on Questran and Imodium with only occasional loose bowel movements. She's had no increased numbness or tingling. She still has some intermittent tingling in her feet but has not changed or worsened, and is not affecting any of her day-to-day activities. Her nail beds are sore,  but she denies any drainage or evidence of infection.  Interval history is markable for Maecie having met with Dr. Jamey Ripa since her last appointment here. They are anticipating a likely right lumpectomy in mid June after completing her neoadjuvant chemotherapy in May.  REVIEW OF SYSTEMS: Keerthi denies any fevers, chills, or night sweats. Her mouth is dry, but she denies any sensitivity or problems swallowing. She has had no rashes or skin changes. She's had no abnormal bruising or bleeding. She's eating and drinking well, keeping herself well hydrated, and has had no nausea, emesis, or change in bowel or bladder habits. She's had no cough, phlegm production, orthopnea, chest pain, or palpitations. She does have some shortness of breath with exertion which has not increased. She denies any abnormal headaches or dizziness.  She denies any significant  myalgias, arthralgias, or bony pain. She's had no peripheral swelling.  A detailed review of systems is otherwise noncontributory.   PAST MEDICAL HISTORY: Past Medical History  Diagnosis Date  . Cancer     breast  . Hypertension   . Breast cancer     PAST SURGICAL HISTORY: Past Surgical History  Procedure Laterality Date  . Portacath placement  12/20/2012    Procedure: INSERTION PORT-A-CATH;  Surgeon: Currie Paris, MD;  Location: [facility] OR;  Service: General;  Laterality: N/A;    FAMILY HISTORY Family History  Problem Relation Age of Onset  . Breast cancer Mother 73  . Heart disease Father   . Bladder Cancer Father 58  . Breast cancer Maternal Aunt     diagnosed in her 56s  . Multiple sclerosis Paternal Aunt   .  Heart disease Maternal Grandmother   . Mental retardation Cousin     maternal cousins; brothers, sons of aunt with breast cancer   the patient's father immigrated from [place] 1938, living in [place] for about 10 years. He had significant coronary artery disease, bladder cancer, and a ruptured aneurysm, but lived to be 91. The  patient's mother is living at age 28. The patient has one brother, no sisters. The patient's mother had breast cancer diagnosed at age 55. The patient is of a Ashkenazi Jewish descent. She will be meeting with our genetics counselor today  GYNECOLOGIC HISTORY: Menarche age 50, first live birth age 22, she is GX P2, menopause 2006, received hormone replacement until approximately 2010.  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 Her husband [name] has a management position for [employer]. This involves a lot of traveling. Son [name] lives in [place] and is a sports Banker. Daughter [name] lives in [place], where she is a Gaffer in international developments and is also getting an MPH in Development worker, international aid. The patient has no grandchildren.   ADVANCED DIRECTIVES: In place  HEALTH MAINTENANCE: History  Substance Use Topics  . Smoking status: Former Smoker -- 1.00 packs/day for 4 years    Types: Cigarettes    Quit date: 11/30/1974  . Smokeless tobacco: Never Used  . Alcohol Use: Yes     Comment: glass of wine on weekend   Pattijo exercises 3-4 times a week, doing weights, walking, and other structures exercises.   Colonoscopy: 2009/ [facility]  PAP: 2013  Bone density: 2010  Lipid panel:  No Known Allergies  Current Outpatient Prescriptions  Medication Sig Dispense Refill  . cholestyramine (QUESTRAN) 4 G packet Take 1 packet by mouth 2 (two) times daily with a meal.  60 each  1  . dexamethasone (DECADRON) 4 MG tablet Take 2 tablets (8 mg total) by mouth 2 (two) times daily with a meal.  30 tablet  4  . Diphenhyd-Hydrocort-Nystatin (FIRST-DUKES MOUTHWASH) SUSP 5-31ml QID prn - swish - swallow or spit  240 mL  1  . fluconazole (DIFLUCAN) 100 MG tablet TAKE 1 TABLET EVERY DAY  7 tablet  1  . lidocaine-prilocaine (EMLA) cream Apply topically as needed. Apply over port site 1-2 hours before chemo and cover with plastic wrap   30 g  4  . LORazepam (ATIVAN) 0.5 MG tablet Take 1 tablet (0.5 mg total) by mouth at bedtime as needed for anxiety.  30 tablet  2  . omeprazole (PRILOSEC) 40 MG capsule Take 1 capsule (40 mg total) by mouth daily.  30 capsule  5  . ondansetron (ZOFRAN) 8 MG tablet Take 1 tablet (8 mg total) by mouth every 12 (twelve) hours as needed for nausea.  20 tablet  4  . prochlorperazine (COMPAZINE) 10 MG tablet Take 1 tablet (10 mg total) by mouth every 6 (six) hours as needed.  30 tablet  4  . simvastatin (ZOCOR) 40 MG tablet Take 40 mg by mouth daily.      Marland Kitchen tobramycin-dexamethasone (TOBRADEX) ophthalmic solution PLACE 1 DROP INTO BOTH EYES EVERY DAY  5 mL  0   No current facility-administered medications for this visit.    OBJECTIVE: Middle-aged white woman who appears comfortable and is in no acute distress Filed Vitals:   03/28/13 1315  BP: 114/76  Pulse: 132  Temp: 98.8 F (37.1 C)  Resp: 20     Body mass index is  28.63 kg/(m^2).    ECOG FS: 0 Filed Weights   03/28/13 1315  Weight: 166 lb 14.4 oz (75.705 kg)   Sclerae unicteric Oropharynx clear, no ulcerations, and no evidence of oral candidiasis, buccal mucosa is dry No cervical or supraclavicular adenopathy Lungs clear to auscultation with no wheezes or rhonchi Heart regular rate and rhythm Abdomen soft, nontender, positive bowel sounds MSK no focal spinal tenderness No peripheral edema Neuro: nonfocal, well oriented with positive affect Breasts:  Deferred.  Axillae are benign bilaterally with no palpable adenopathy noted.   Port is intact in the left upper chest wall with no evidence of infection.     LAB RESULTS: CBC    Component Value Date/Time   WBC 14.3* 03/28/2013 1245   RBC 3.51* 03/28/2013 1245   HGB 10.5* 03/28/2013 1245   HCT 31.4* 03/28/2013 1245   PLT 162 03/28/2013 1245   MCV 89.5 03/28/2013 1245   MCH 29.9 03/28/2013 1245   MCHC 33.4 03/28/2013 1245   RDW 17.8* 03/28/2013 1245   LYMPHSABS 2.5 03/28/2013 1245   MONOABS  1.8* 03/28/2013 1245   EOSABS 0.0 03/28/2013 1245   BASOSABS 0.0 03/28/2013 1245       Chemistry      Component Value Date/Time   NA 138 03/21/2013 0924   K 4.1 03/21/2013 0924   CL 106 03/21/2013 0924   CO2 22 03/21/2013 0924   BUN 22.6 03/21/2013 0924   BUN 15 08/23/2010 1350   CREATININE 1.0 03/21/2013 0924   CREATININE 1.00 08/23/2010 1350      Component Value Date/Time   CALCIUM 9.3 03/21/2013 0924       Lab Results  Component Value Date   LABCA2 23 12/07/2012    STUDIES:  Most recent echocardiogram on 03/25/2013 showed an ejection fraction of 55 - 60 %.    Mr Breast Bilateral W Wo Contrast  02/27/2013  *RADIOLOGY REPORT*  Clinical Data: Known invasive ductal carcinoma in the 5 o'clock region of the right breast.  Assess response to neoadjuvant chemotherapy.  BILATERAL BREAST MRI WITH AND WITHOUT CONTRAST  Technique: Multiplanar, multisequence MR images of both breasts were obtained prior to and following the intravenous administration of 16ml of multihance.  Three dimensional images were evaluated at the independent DynaCad workstation.  Comparison:  Mammograms dated 11/15/2012 and 11/02/2011.  MRI dated 11/22/2012.  Findings: There is minimal background parenchymal enhancement pattern.  There  is  no abnormal enhancement in the left breast.   Left-sided Port-A-Cath.  Far posteriorly in the 5 o'clock region of the right breast there has been significant interval improvement in the intensity and size of enhancement in the known area of invasive ductal carcinoma.  3.1 x 1.9 x 1.4 cm (anterior-posterior, transverse and longitudinal dimensions) non mass-like enhancement is now visualized. Previously a 3.2 x 3.1 x 3.1 cm irregular mass was described.  The enhancement is associated with a biopsy clip artifact.  No discernible fat plane is identified between the mass and the pectoralis major muscle.  There is no enlarged axillary or internal mammary adenopathy.  IMPRESSION: Interval response of the  known right breast invasive ductal carcinoma to neoadjuvant chemotherapy, as described above.  RECOMMENDATION: Treatment plan.  THREE-DIMENSIONAL MR IMAGE RENDERING ON INDEPENDENT WORKSTATION:  Three-dimensional MR images were rendered by post-processing of the original MR data on an independent workstation.  The three- dimensional MR images were interpreted, and findings were reported in the accompanying complete MRI report for this study.  BI-RADS CATEGORY 6:  Known biopsy-proven malignancy - appropriate action should be taken.   Original Report Authenticated By: Baird Lyons, M.D.     ASSESSMENT: 62 y.o. [place] woman   (1)  s/p right breast biopsy 11/15/2012 for a clinical T2 N0, stage IIA invasive ductal carcinoma, grade 3, with superficial involvement of the pectoralis muscle, estrogen receptor 29% positive, progesterone receptor negative, with an Mib-1 of 79% and HER-2 amplification by CISH with a ratio of 4.39  (2)  being treated in the neoadjuvant setting, the plan being to complete a total of 6 q. three-week doses of docetaxel/carboplatin/trastuzumab prior to definitive surgery. Trastuzumab will then be continued for total of one year.  PLAN:  Asencion tolerated her fifth cycle of chemotherapy well. She return in 2 weeks, may 15, for her sixth and final dose of neoadjuvant chemotherapy. Of course she'll continue to receive trastuzumab every 3 weeks for total of one year, and her next echocardiogram will be repeated in late July.  She'll be contacting Dr. Jamey Ripa per his request to set up a preop appointment, and schedule her definitive surgery, likely in mid June. We will go ahead and order a repeat breast MRI for late May after the completion of neoadjuvant chemotherapy.  She will need radiation following surgery, and will be scheduled to see Dr. Michell Heinrich in late June or early July.  Nami voices understanding and agreement with this plan, and will call any changes or problems.  Lakynn Halvorsen     03/28/2013

## 2013-03-29 ENCOUNTER — Telehealth (INDEPENDENT_AMBULATORY_CARE_PROVIDER_SITE_OTHER): Payer: Self-pay | Admitting: General Surgery

## 2013-03-29 ENCOUNTER — Other Ambulatory Visit (INDEPENDENT_AMBULATORY_CARE_PROVIDER_SITE_OTHER): Payer: Self-pay | Admitting: Surgery

## 2013-03-29 DIAGNOSIS — C50911 Malignant neoplasm of unspecified site of right female breast: Secondary | ICD-10-CM

## 2013-03-29 NOTE — Telephone Encounter (Signed)
Preop appt made with patient.

## 2013-03-29 NOTE — Telephone Encounter (Signed)
Message copied by Liliana Cline on Fri Mar 29, 2013 11:05 AM ------      Message from: Marnette Burgess      Created: Fri Mar 29, 2013 10:34 AM      Contact: [phone]       Pt sch for July 11/2012 for po she states needs an app before sx,pls call. ------

## 2013-04-11 ENCOUNTER — Ambulatory Visit (HOSPITAL_BASED_OUTPATIENT_CLINIC_OR_DEPARTMENT_OTHER): Payer: 59

## 2013-04-11 ENCOUNTER — Other Ambulatory Visit: Payer: 59 | Admitting: Lab

## 2013-04-11 ENCOUNTER — Telehealth: Payer: Self-pay | Admitting: *Deleted

## 2013-04-11 ENCOUNTER — Other Ambulatory Visit (HOSPITAL_BASED_OUTPATIENT_CLINIC_OR_DEPARTMENT_OTHER): Payer: 59 | Admitting: Lab

## 2013-04-11 ENCOUNTER — Ambulatory Visit (HOSPITAL_BASED_OUTPATIENT_CLINIC_OR_DEPARTMENT_OTHER): Payer: 59 | Admitting: Physician Assistant

## 2013-04-11 ENCOUNTER — Encounter: Payer: Self-pay | Admitting: Oncology

## 2013-04-11 ENCOUNTER — Other Ambulatory Visit: Payer: Self-pay | Admitting: Physician Assistant

## 2013-04-11 ENCOUNTER — Encounter: Payer: Self-pay | Admitting: Physician Assistant

## 2013-04-11 VITALS — BP 124/81 | HR 118 | Temp 98.4°F | Resp 20 | Ht 64.0 in | Wt 173.1 lb

## 2013-04-11 DIAGNOSIS — Z5112 Encounter for antineoplastic immunotherapy: Secondary | ICD-10-CM

## 2013-04-11 DIAGNOSIS — Z170 Estrogen receptor positive status [ER+]: Secondary | ICD-10-CM

## 2013-04-11 DIAGNOSIS — C50319 Malignant neoplasm of lower-inner quadrant of unspecified female breast: Secondary | ICD-10-CM

## 2013-04-11 DIAGNOSIS — Z5111 Encounter for antineoplastic chemotherapy: Secondary | ICD-10-CM

## 2013-04-11 DIAGNOSIS — R0602 Shortness of breath: Secondary | ICD-10-CM

## 2013-04-11 DIAGNOSIS — C50911 Malignant neoplasm of unspecified site of right female breast: Secondary | ICD-10-CM

## 2013-04-11 LAB — CBC WITH DIFFERENTIAL/PLATELET
Basophils Absolute: 0 10*3/uL (ref 0.0–0.1)
Eosinophils Absolute: 0 10*3/uL (ref 0.0–0.5)
HCT: 29 % — ABNORMAL LOW (ref 34.8–46.6)
HGB: 9.2 g/dL — ABNORMAL LOW (ref 11.6–15.9)
LYMPH%: 8.3 % — ABNORMAL LOW (ref 14.0–49.7)
MCV: 93.9 fL (ref 79.5–101.0)
MONO#: 0.8 10*3/uL (ref 0.1–0.9)
MONO%: 5.5 % (ref 0.0–14.0)
NEUT#: 12.1 10*3/uL — ABNORMAL HIGH (ref 1.5–6.5)
NEUT%: 86.1 % — ABNORMAL HIGH (ref 38.4–76.8)
Platelets: 399 10*3/uL (ref 145–400)
WBC: 14.1 10*3/uL — ABNORMAL HIGH (ref 3.9–10.3)

## 2013-04-11 LAB — COMPREHENSIVE METABOLIC PANEL (CC13)
ALT: 39 U/L (ref 0–55)
CO2: 26 mEq/L (ref 22–29)
Calcium: 9.4 mg/dL (ref 8.4–10.4)
Chloride: 104 mEq/L (ref 98–107)
Creatinine: 1.1 mg/dL (ref 0.6–1.1)
Glucose: 128 mg/dl — ABNORMAL HIGH (ref 70–99)
Total Bilirubin: 0.31 mg/dL (ref 0.20–1.20)

## 2013-04-11 MED ORDER — TRASTUZUMAB CHEMO INJECTION 440 MG
6.0000 mg/kg | Freq: Once | INTRAVENOUS | Status: AC
  Administered 2013-04-11: 462 mg via INTRAVENOUS
  Filled 2013-04-11: qty 22

## 2013-04-11 MED ORDER — SODIUM CHLORIDE 0.9 % IV SOLN
75.0000 mg/m2 | Freq: Once | INTRAVENOUS | Status: AC
  Administered 2013-04-11: 140 mg via INTRAVENOUS
  Filled 2013-04-11: qty 14

## 2013-04-11 MED ORDER — DEXAMETHASONE SODIUM PHOSPHATE 20 MG/5ML IJ SOLN
20.0000 mg | Freq: Once | INTRAMUSCULAR | Status: AC
  Administered 2013-04-11: 20 mg via INTRAVENOUS

## 2013-04-11 MED ORDER — DIPHENHYDRAMINE HCL 25 MG PO CAPS
25.0000 mg | ORAL_CAPSULE | Freq: Once | ORAL | Status: AC
  Administered 2013-04-11: 25 mg via ORAL

## 2013-04-11 MED ORDER — SODIUM CHLORIDE 0.9 % IJ SOLN
10.0000 mL | INTRAMUSCULAR | Status: DC | PRN
  Administered 2013-04-11: 10 mL
  Filled 2013-04-11: qty 10

## 2013-04-11 MED ORDER — SODIUM CHLORIDE 0.9 % IV SOLN
600.0000 mg | Freq: Once | INTRAVENOUS | Status: AC
  Administered 2013-04-11: 600 mg via INTRAVENOUS
  Filled 2013-04-11: qty 60

## 2013-04-11 MED ORDER — ACETAMINOPHEN 325 MG PO TABS
650.0000 mg | ORAL_TABLET | Freq: Once | ORAL | Status: AC
  Administered 2013-04-11: 650 mg via ORAL

## 2013-04-11 MED ORDER — HEPARIN SOD (PORK) LOCK FLUSH 100 UNIT/ML IV SOLN
500.0000 [IU] | Freq: Once | INTRAVENOUS | Status: AC | PRN
  Administered 2013-04-11: 500 [IU]
  Filled 2013-04-11: qty 5

## 2013-04-11 MED ORDER — ONDANSETRON 16 MG/50ML IVPB (CHCC)
16.0000 mg | Freq: Once | INTRAVENOUS | Status: AC
  Administered 2013-04-11: 16 mg via INTRAVENOUS

## 2013-04-11 MED ORDER — SODIUM CHLORIDE 0.9 % IV SOLN
Freq: Once | INTRAVENOUS | Status: AC
  Administered 2013-04-11: 13:00:00 via INTRAVENOUS

## 2013-04-11 NOTE — Patient Instructions (Signed)
Patient aware of next appointment; discharged home with husband and no complaints. 

## 2013-04-11 NOTE — Progress Notes (Signed)
ID: Anita Randolph   DOB: Oct 11, 1951  MR#: [id number]  WUJ#:[id number]  PCP: Mila Palmer GYN: Meredeth Ide SU: Cicero Duck OTHER MD: Arvilla Meres, Sabah Zucco [place], Rick Cornella   HISTORY OF PRESENT ILLNESS: Anita Randolph herself palpated a mass in her right breast 11/09/2012. She brought this to Dr. Harlene Salts attention and mammography was obtained at [facility], (I do not have that report today) confirming a mass in the lower inner quadrant of the right breast. 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  Bilateral breast MRI 11/22/2012 showed a far posterior right breast mass measuring 3.2 cm with no discernible fat plane between the mass and the underlying pectoralis major. There was extension of irregular enhancement into the superficial aspect of the cholesterol is muscle underlying the mass, but there was no evidence for chest wall involvement, internal mammary artery chain or axillary lymph node involvement, or anything else in the breast or the contralateral breast.  The patient's subsequent history is as detailed below  INTERVAL HISTORY: Anita Randolph returns today for followup of her locally advanced right breast carcinoma. She is due for her sixth and final planned cycle of docetaxel/ carboplatin/ trastuzumab being given in the neoadjuvant setting. She receives Neulasta on day 2 for granulocyte support.  Anita Randolph is thrilled to be completing her neoadjuvant chemotherapy today. She is feeling well, and really has very few complaints. She has only some mild tingling in her feet, but notes that this is actually improved since her last visit here. She's had no additional signs of peripheral neuropathy. Her bowels are "back to normal" with no significant diarrhea or constipation.   REVIEW OF SYSTEMS: Anita Randolph denies any fevers,  chills, or night sweats. Her mouth is dry, but she denies any sensitivity or problems swallowing. She has had no rashes or skin changes, and denies any abnormal bruising or bleeding. She's eating and drinking well, keeping herself well hydrated, and has had no nausea, emesis, or change in bowel or bladder habits. She's had no cough, phlegm production, orthopnea, chest pain, or palpitations. She does have some shortness of breath with exertion which has not increased. She denies any abnormal headaches or dizziness.  She denies any significant  myalgias, arthralgias, or bony pain. She's had no peripheral swelling.  A detailed review of systems is otherwise noncontributory.   PAST MEDICAL HISTORY: Past Medical History  Diagnosis Date  . Cancer     breast  . Hypertension   . Breast cancer     PAST SURGICAL HISTORY: Past Surgical History  Procedure Laterality Date  . Portacath placement  12/20/2012    Procedure: INSERTION PORT-A-CATH;  Surgeon: Currie Paris, MD;  Location: [facility] OR;  Service: General;  Laterality: N/A;    FAMILY HISTORY Family History  Problem Relation Age of Onset  . Breast cancer Mother 57  . Heart disease Father   . Bladder Cancer Father 42  . Breast cancer Maternal Aunt     diagnosed in her 50s  . Multiple sclerosis Paternal Aunt   . Heart disease Maternal Grandmother   . Mental retardation Cousin     maternal cousins; brothers, sons of aunt with breast cancer   the patient's father immigrated from [place] 1938, living in [place] for about 10 years. He had significant coronary artery disease, bladder cancer, and a ruptured aneurysm, but lived to  be 61. The patient's mother is living at age 27. The patient has one brother, no sisters. The patient's mother had breast cancer diagnosed at age 76. The patient is of a Ashkenazi Jewish descent. She will be meeting with our genetics counselor today  GYNECOLOGIC HISTORY: Menarche age 17, first live birth age 18, she is GX  P2, menopause 2006, received hormone replacement until approximately 2010.  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 This involves a lot of traveling. Son [name] lives in [place] and is a sports Banker. Daughter [name] lives in [place], where she is a Gaffer in international developments and is also getting an MPH in Development worker, international aid. The patient has no grandchildren.   ADVANCED DIRECTIVES: In place  HEALTH MAINTENANCE: History  Substance Use Topics  . Smoking status: Former Smoker -- 1.00 packs/day for 4 years    Types: Cigarettes    Quit date: 11/30/1974  . Smokeless tobacco: Never Used  . Alcohol Use: Yes     Comment: glass of wine on weekend   Anita Randolph exercises 3-4 times a week, doing weights, walking, and other structures exercises.   Colonoscopy: 2009/ [facility]  PAP: 2013  Bone density: 2010  Lipid panel:  No Known Allergies  Current Outpatient Prescriptions  Medication Sig Dispense Refill  . cholestyramine (QUESTRAN) 4 G packet Take 1 packet by mouth 2 (two) times daily with a meal.  60 each  1  . dexamethasone (DECADRON) 4 MG tablet Take 2 tablets (8 mg total) by mouth 2 (two) times daily with a meal.  30 tablet  4  . Diphenhyd-Hydrocort-Nystatin (FIRST-DUKES MOUTHWASH) SUSP 5-66ml QID prn - swish - swallow or spit  240 mL  1  . fluconazole (DIFLUCAN) 100 MG tablet TAKE 1 TABLET EVERY DAY  7 tablet  1  . lidocaine-prilocaine (EMLA) cream Apply topically as needed. Apply over port site 1-2 hours before chemo and cover with plastic wrap  30 g  4  . LORazepam (ATIVAN) 0.5 MG tablet Take 1 tablet (0.5 mg total) by mouth at bedtime as needed for anxiety.  30 tablet  2  . omeprazole (PRILOSEC) 40 MG capsule Take 1 capsule (40 mg total) by mouth daily.  30 capsule  5  . ondansetron (ZOFRAN) 8 MG tablet Take 1 tablet (8 mg total) by mouth every 12  (twelve) hours as needed for nausea.  20 tablet  4  . prochlorperazine (COMPAZINE) 10 MG tablet Take 1 tablet (10 mg total) by mouth every 6 (six) hours as needed.  30 tablet  4  . simvastatin (ZOCOR) 40 MG tablet Take 40 mg by mouth daily.      Anita Randolph tobramycin-dexamethasone (TOBRADEX) ophthalmic solution PLACE 1 DROP INTO BOTH EYES EVERY DAY  5 mL  0   No current facility-administered medications for this visit.    OBJECTIVE: Middle-aged white woman who appears comfortable and is in no acute distress Filed Vitals:   04/11/13 1148  BP: 124/81  Pulse: 118  Temp: 98.4 F (36.9 C)  Resp: 20     Body mass index is 29.7 kg/(m^2).    ECOG FS: 0 Filed Weights   04/11/13 1148  Weight: 173 lb 1.6 oz (78.518 kg)   Sclerae unicteric Oropharynx clear, no ulcerations, and no evidence of oral candidiasis. No cervical or supraclavicular adenopathy Lungs clear to auscultation with no wheezes or rhonchi Heart regular rate and  rhythm Abdomen soft, nontender, positive bowel sounds MSK no focal spinal tenderness No peripheral edema Neuro: nonfocal, well oriented with positive affect Breasts:  Unable to palpate a distinct mass in the right breast. Left breast is unremarkable.  Axillae are benign bilaterally with no palpable adenopathy noted.   Port is intact in the left upper chest wall with no evidence of infection.     LAB RESULTS: CBC    Component Value Date/Time   WBC 14.1* 04/11/2013 1117   RBC 3.09* 04/11/2013 1117   HGB 9.2* 04/11/2013 1117   HCT 29.0* 04/11/2013 1117   PLT 399 04/11/2013 1117   MCV 93.9 04/11/2013 1117   MCH 29.8 04/11/2013 1117   MCHC 31.7 04/11/2013 1117   RDW 18.8* 04/11/2013 1117   LYMPHSABS 1.2 04/11/2013 1117   MONOABS 0.8 04/11/2013 1117   EOSABS 0.0 04/11/2013 1117   BASOSABS 0.0 04/11/2013 1117       Chemistry      Component Value Date/Time   NA 138 03/21/2013 0924   K 4.1 03/21/2013 0924   CL 106 03/21/2013 0924   CO2 22 03/21/2013 0924   BUN 22.6 03/21/2013  0924   BUN 15 08/23/2010 1350   CREATININE 1.0 03/21/2013 0924   CREATININE 1.00 08/23/2010 1350      Component Value Date/Time   CALCIUM 9.3 03/21/2013 0924        STUDIES:  Most recent echocardiogram on 03/25/2013 showed an ejection fraction of 55 - 60 %.    ASSESSMENT: 62 y.o. [place] woman   (1)  s/p right breast biopsy 11/15/2012 for a clinical T2 N0, stage IIA invasive ductal carcinoma, grade 3, with superficial involvement of the pectoralis muscle, estrogen receptor 29% positive, progesterone receptor negative, with an Mib-1 of 79% and HER-2 amplification by CISH with a ratio of 4.39  (2)  being treated in the neoadjuvant setting, the plan being to complete a total of 6 q. three-week doses of docetaxel/carboplatin/trastuzumab prior to definitive surgery. Trastuzumab will then be continued for total of one year (Through January 2015).  PLAN:  Elissia will procedd to treatment today for her sixth and final dose of neoadjuvant chemo.  She will have a repeato breast MRI on 04/23/2013 and will see Dr. Darnelle Catalan for follow up on 04/24/13.  She is already scheduled for her definitive surgery with Dr. Jamey Ripa on 05/07/13.  She will need radiation following surgery, and will be scheduled to see Dr. Michell Heinrich in late June or early July.  Of course she'll continue to receive trastuzumab every 3 weeks for total of one year, and her next echocardiogram will be repeated in late July.  Samariyah voices understanding and agreement with this plan, and will call any changes or problems.  Hanan Mcwilliams    04/11/2013

## 2013-04-11 NOTE — Telephone Encounter (Signed)
appts made and printed. Pt is aware that she is on [facility] call list for her appts in July. Pt is also aware that tx will be added for 05/02/13. i emailed MW to add the tx. i informed her that i would call her with her tx time...td

## 2013-04-12 ENCOUNTER — Ambulatory Visit (HOSPITAL_BASED_OUTPATIENT_CLINIC_OR_DEPARTMENT_OTHER): Payer: 59

## 2013-04-12 ENCOUNTER — Telehealth: Payer: Self-pay | Admitting: *Deleted

## 2013-04-12 VITALS — BP 121/71 | HR 104 | Temp 97.9°F

## 2013-04-12 DIAGNOSIS — Z5189 Encounter for other specified aftercare: Secondary | ICD-10-CM

## 2013-04-12 DIAGNOSIS — C50319 Malignant neoplasm of lower-inner quadrant of unspecified female breast: Secondary | ICD-10-CM

## 2013-04-12 DIAGNOSIS — C50911 Malignant neoplasm of unspecified site of right female breast: Secondary | ICD-10-CM

## 2013-04-12 MED ORDER — PEGFILGRASTIM INJECTION 6 MG/0.6ML
6.0000 mg | Freq: Once | SUBCUTANEOUS | Status: AC
  Administered 2013-04-12: 6 mg via SUBCUTANEOUS
  Filled 2013-04-12: qty 0.6

## 2013-04-12 NOTE — Telephone Encounter (Signed)
Per staff message and POF I have scheduled appts.  JMW  

## 2013-04-20 ENCOUNTER — Other Ambulatory Visit: Payer: Self-pay | Admitting: Oncology

## 2013-04-20 DIAGNOSIS — C50911 Malignant neoplasm of unspecified site of right female breast: Secondary | ICD-10-CM

## 2013-04-20 MED ORDER — DIPHENOXYLATE-ATROPINE 2.5-0.025 MG PO TABS: 1.0000 | ORAL_TABLET | ORAL | Status: DC | PRN

## 2013-04-20 NOTE — Progress Notes (Signed)
She called with a report of diarrhea. No nausea. Reports diarrhea with previous cycles of chemotherapy. Imodium has not helped with this cycle. I called in lomotil. She will try the lomotil and push fluids.  She will call if this does not help.

## 2013-04-23 ENCOUNTER — Ambulatory Visit (HOSPITAL_COMMUNITY)
Admission: RE | Admit: 2013-04-23 | Discharge: 2013-04-23 | Disposition: A | Discharge status: Home / Self Care | Payer: 59 | Source: Ambulatory Visit | Attending: Physician Assistant | Admitting: Physician Assistant

## 2013-04-23 DIAGNOSIS — Z9221 Personal history of antineoplastic chemotherapy: Secondary | ICD-10-CM | POA: Insufficient documentation

## 2013-04-23 DIAGNOSIS — C50919 Malignant neoplasm of unspecified site of unspecified female breast: Secondary | ICD-10-CM | POA: Insufficient documentation

## 2013-04-23 DIAGNOSIS — C50911 Malignant neoplasm of unspecified site of right female breast: Secondary | ICD-10-CM

## 2013-04-23 MED ORDER — GADOBENATE DIMEGLUMINE 529 MG/ML IV SOLN
20.0000 mL | Freq: Once | INTRAVENOUS | Status: AC | PRN
  Administered 2013-04-23: 16 mL via INTRAVENOUS

## 2013-04-24 ENCOUNTER — Other Ambulatory Visit (HOSPITAL_COMMUNITY): Payer: Self-pay | Admitting: Oncology

## 2013-04-24 ENCOUNTER — Ambulatory Visit (HOSPITAL_BASED_OUTPATIENT_CLINIC_OR_DEPARTMENT_OTHER): Payer: 59 | Admitting: Oncology

## 2013-04-24 ENCOUNTER — Other Ambulatory Visit (HOSPITAL_BASED_OUTPATIENT_CLINIC_OR_DEPARTMENT_OTHER): Payer: 59 | Admitting: Lab

## 2013-04-24 VITALS — BP 103/68 | HR 102 | Temp 98.7°F | Resp 20 | Ht 64.0 in | Wt 169.9 lb

## 2013-04-24 DIAGNOSIS — C50911 Malignant neoplasm of unspecified site of right female breast: Secondary | ICD-10-CM

## 2013-04-24 DIAGNOSIS — C50319 Malignant neoplasm of lower-inner quadrant of unspecified female breast: Secondary | ICD-10-CM

## 2013-04-24 DIAGNOSIS — Z170 Estrogen receptor positive status [ER+]: Secondary | ICD-10-CM

## 2013-04-24 LAB — CBC WITH DIFFERENTIAL/PLATELET
Basophils Absolute: 0 10*3/uL (ref 0.0–0.1)
HCT: 29.2 % — ABNORMAL LOW (ref 34.8–46.6)
HGB: 9.3 g/dL — ABNORMAL LOW (ref 11.6–15.9)
MONO#: 0.5 10*3/uL (ref 0.1–0.9)
NEUT%: 74 % (ref 38.4–76.8)
WBC: 7.8 10*3/uL (ref 3.9–10.3)
lymph#: 1.5 10*3/uL (ref 0.9–3.3)

## 2013-04-24 NOTE — Progress Notes (Signed)
ID: DALLIS DARDEN   DOB: Apr 03, 1951  MR#: [id number]  JXB#:[id number]  PCP: Mila Palmer GYN: Meredeth Ide SU: Cicero Duck OTHER MD: Arvilla Meres, Amy Swaziland, Rick Cornella   HISTORY OF PRESENT ILLNESS: Sabrie herself palpated a mass in her right breast 11/09/2012. She brought this to Dr. Harlene Salts attention and mammography was obtained at [facility], (I do not have that report today) confirming a mass in the lower inner quadrant of the right breast. 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  Bilateral breast MRI 11/22/2012 showed a far posterior right breast mass measuring 3.2 cm with no discernible fat plane between the mass and the underlying pectoralis major. There was extension of irregular enhancement into the superficial aspect of the cholesterol is muscle underlying the mass, but there was no evidence for chest wall involvement, internal mammary artery chain or axillary lymph node involvement, or anything else in the breast or the contralateral breast.  The patient's subsequent history is as detailed below  INTERVAL HISTORY: Natina returns today for followup of her locally advanced right breast carcinoma. She completed her chemotherapy treatments 2 weeks ago. She already has a surgical date for 05/07/2013. Today we reviewed her MRI results, which are excellent.  REVIEW OF SYSTEMS: The worse part of chemotherapy for her was diarrhea, and she is actually still using some Lomotil. We have evaluated this for C. difficile and it was negative. After that, fatigue was the worst problem, though she was able to continue to work full-time (she would work out of her home approximately one week after each chemotherapy treatment). The fatigue has been cumulative and of she's only just beginning to recover from her most  recent treatment. She did have some neuropathy involving some tingling of her fingertips and the front of her feet, but this is already getting better. There is mild numbness, but no real discomfort. She has tenderness on her nailbeds. She lost about 10 pounds in the last 5 months. Otherwise a detailed review of systems today was entirely noncontributory.   PAST MEDICAL HISTORY: Past Medical History  Diagnosis Date  . Cancer     breast  . Hypertension   . Breast cancer     PAST SURGICAL HISTORY: Past Surgical History  Procedure Laterality Date  . Portacath placement  12/20/2012    Procedure: INSERTION PORT-A-CATH;  Surgeon: Currie Paris, MD;  Location: [facility] OR;  Service: General;  Laterality: N/A;    FAMILY HISTORY Family History  Problem Relation Age of Onset  . Breast cancer Mother 67  . Heart disease Father   . Bladder Cancer Father 8  . Breast cancer Maternal Aunt     diagnosed in her 75s  . Multiple sclerosis Paternal Aunt   . Heart disease Maternal Grandmother   . Mental retardation Cousin     maternal cousins; brothers, sons of aunt with breast cancer   the patient's father immigrated from [place] 1938, living in [place] for about 10 years. He had significant coronary artery disease, bladder cancer, and a ruptured aneurysm, but lived to be 91. The patient's mother is living at age 61. The patient has one brother, no sisters. The patient's mother had breast cancer diagnosed at age 61. The patient is of a Ashkenazi Jewish descent. She will be meeting with our genetics counselor today  GYNECOLOGIC HISTORY:  Menarche age 46, first live birth age 33, she is GX P2, menopause 2006, received hormone replacement until approximately 2010.  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 Her husband [name] has a management position for [employer]. This involves a lot of traveling. Son [name] lives in [place] and is a sports Psychologist, clinical. Daughter [name] lives in [place], where she is a Gaffer in international developments and is also getting an MPH in Development worker, international aid. The patient has no grandchildren.   ADVANCED DIRECTIVES: In place  HEALTH MAINTENANCE: History  Substance Use Topics  . Smoking status: Former Smoker -- 1.00 packs/day for 4 years    Types: Cigarettes    Quit date: 11/30/1974  . Smokeless tobacco: Never Used  . Alcohol Use: Yes     Comment: glass of wine on weekend   Kindall exercises 3-4 times a week, doing weights, walking, and other structures exercises.   Colonoscopy: 2009/ [facility]  PAP: 2013  Bone density: 2010  Lipid panel:  No Known Allergies  Current Outpatient Prescriptions  Medication Sig Dispense Refill  . cholestyramine (QUESTRAN) 4 G packet Take 1 packet by mouth 2 (two) times daily with a meal.  60 each  1  . dexamethasone (DECADRON) 4 MG tablet Take 2 tablets (8 mg total) by mouth 2 (two) times daily with a meal.  30 tablet  4  . Diphenhyd-Hydrocort-Nystatin (FIRST-DUKES MOUTHWASH) SUSP 5-83ml QID prn - swish - swallow or spit  240 mL  1  . diphenoxylate-atropine (LOMOTIL) 2.5-0.025 MG per tablet Take 1 tablet by mouth every 4 (four) hours as needed for diarrhea or loose stools (up to 6 per day).  30 tablet  0  . fluconazole (DIFLUCAN) 100 MG tablet TAKE 1 TABLET EVERY DAY  7 tablet  1  . lidocaine-prilocaine (EMLA) cream Apply topically as needed. Apply over port site 1-2 hours before chemo and cover with plastic wrap  30 g  4  . LORazepam (ATIVAN) 0.5 MG tablet Take 1 tablet (0.5 mg total) by mouth at bedtime as needed for anxiety.  30 tablet  2  . omeprazole (PRILOSEC) 40 MG capsule Take 1 capsule (40 mg total) by mouth daily.  30 capsule  5  . ondansetron (ZOFRAN) 8 MG tablet Take 1 tablet (8 mg total) by mouth every 12 (twelve) hours as needed for nausea.  20 tablet  4  . prochlorperazine (COMPAZINE) 10 MG tablet Take 1 tablet (10 mg total) by mouth every 6  (six) hours as needed.  30 tablet  4  . simvastatin (ZOCOR) 40 MG tablet Take 40 mg by mouth daily.      Marland Kitchen tobramycin-dexamethasone (TOBRADEX) ophthalmic solution PLACE 1 DROP INTO BOTH EYES EVERY DAY  5 mL  0   No current facility-administered medications for this visit.    OBJECTIVE: Middle-aged white woman in no acute distress Filed Vitals:   04/24/13 1034  BP: 103/68  Pulse: 102  Temp: 98.7 F (37.1 C)  Resp: 20     Body mass index is 29.15 kg/(m^2).    ECOG FS: 0 Filed Weights   04/24/13 1034  Weight: 169 lb 14.4 oz (77.066 kg)   Sclerae unicteric Oropharynx clear No cervical or supraclavicular adenopathy Lungs clear to auscultation with no wheezes or rhonchi Heart regular rate and rhythm Abdomen soft, nontender, positive bowel sounds MSK no focal spinal tenderness No peripheral edema Neuro: nonfocal, well oriented with positive affect Breasts:  Unable to  palpate a  mass in the inner lower quadrant of the right breast. The right axilla is benign Left breast is unremarkable.  Samuel Bouche is intact in the left upper chest wall with no evidence of infection.     LAB RESULTS: CBC    Component Value Date/Time   WBC 7.8 04/24/2013 1013   RBC 3.08* 04/24/2013 1013   HGB 9.3* 04/24/2013 1013   HCT 29.2* 04/24/2013 1013   PLT 92* 04/24/2013 1013   MCV 94.8 04/24/2013 1013   MCH 30.2 04/24/2013 1013   MCHC 31.8 04/24/2013 1013   RDW 17.3* 04/24/2013 1013   LYMPHSABS 1.5 04/24/2013 1013   MONOABS 0.5 04/24/2013 1013   EOSABS 0.0 04/24/2013 1013   BASOSABS 0.0 04/24/2013 1013       Chemistry      Component Value Date/Time   NA 140 04/11/2013 1116   K 3.9 04/11/2013 1116   CL 104 04/11/2013 1116   CO2 26 04/11/2013 1116   BUN 18.3 04/11/2013 1116   BUN 15 08/23/2010 1350   CREATININE 1.1 04/11/2013 1116   CREATININE 1.00 08/23/2010 1350      Component Value Date/Time   CALCIUM 9.4 04/11/2013 1116        STUDIES: Mr Breast Bilateral W Wo Contrast  04/23/2013   *RADIOLOGY  REPORT*  Clinical Data: Known right breast invasive ductal carcinoma. Assess response to neoadjuvant chemotherapy.  BILATERAL BREAST MRI WITH AND WITHOUT CONTRAST  Technique: Multiplanar, multisequence MR images of both breasts were obtained prior to and following the intravenous administration of 16ml of Multihance.  Three dimensional images were evaluated at the independent DynaCad workstation.  Comparison:  02/27/2013 and 11/22/2012 breast MRIs.  Findings: The previously seen enhancing mass located far posteriorly within the lower inner quadrant of the right breast at the 5 o'clock position has considerably decreased in size and enhancement when compared to the prior studies.  Residual linear enhancement is present in the area of the previously seen mass. This area of linear enhancement now measures 1.9 x 0.6 x 0.6 cm in size.  There are no new areas of worrisome enhancement within either breast.  There is no evidence for axillary or internal mammary adenopathy.  IMPRESSION: Excellent response to neoadjuvant chemotherapy as discussed above.  RECOMMENDATION: Treatment plan  THREE-DIMENSIONAL MR IMAGE RENDERING ON INDEPENDENT WORKSTATION:  Three-dimensional MR images were rendered by post-processing of the original MR data on an independent workstation.  The three- dimensional MR images were interpreted, and findings were reported in the accompanying complete MRI report for this study.  BI-RADS CATEGORY 6:  Known biopsy-proven malignancy - appropriate action should be taken.   Original Report Authenticated By: Rolla Plate, M.D.   Most recent echocardiogram on 03/25/2013 showed an ejection fraction of 55 - 60 %.    ASSESSMENT: 62 y.o. [place] woman   (1)  s/p right breast biopsy 11/15/2012 for a clinical T2 N0, stage IIA invasive ductal carcinoma, grade 3, with superficial involvement of the pectoralis muscle, estrogen receptor 29% positive, progesterone receptor negative, with an Mib-1 of 79% and  HER-2 amplification by [facility] with a ratio of 4.39  (2) treated in the neoadjuvant setting, completing 6 cycles of docetaxel/ carboplatin/ trastuzumab 04/11/2013  (3) trastuzumab will be continued for total of one year (through January 2015).-- Most recent echo 03/25/2013 showed a well-preserved ejecton fraction  (4) surgery scheduled for 05/07/2013, and she really has an appointment with radiation oncology for 625 when he 15 and with [place]  again on 05/23/2013  PLAN:  Ghadeer and [name] (who participated long distance) are very pleased with the MRI results. I hope the pathology is concordant. We are going to continue the trastuzumab, with the next dose being June 5, and the next echocardiogram in July. I encouraged Brisia to start walking so she can pick up a little bit more energy. She is still somewhat anemic, but by the time of her surgery her hemoglobin should be close to 11 if not above. They are planning a beach trip within a week after her surgery, and I think that's feasible. She understands to stay away from harsh sunlight. Otherwise the plan is to continue Herceptin to total one year and to start antiestrogen so once her radiation treatments have been completed.    Lakeita Panther C    04/24/2013

## 2013-05-01 ENCOUNTER — Ambulatory Visit (INDEPENDENT_AMBULATORY_CARE_PROVIDER_SITE_OTHER): Payer: 59 | Admitting: Surgery

## 2013-05-01 ENCOUNTER — Encounter (INDEPENDENT_AMBULATORY_CARE_PROVIDER_SITE_OTHER): Payer: Self-pay | Admitting: Surgery

## 2013-05-01 ENCOUNTER — Encounter (HOSPITAL_BASED_OUTPATIENT_CLINIC_OR_DEPARTMENT_OTHER): Payer: Self-pay | Admitting: *Deleted

## 2013-05-01 VITALS — BP 104/74 | HR 72 | Temp 97.8°F | Resp 17 | Ht 64.0 in | Wt 176.0 lb

## 2013-05-01 DIAGNOSIS — C50911 Malignant neoplasm of unspecified site of right female breast: Secondary | ICD-10-CM

## 2013-05-01 DIAGNOSIS — C50919 Malignant neoplasm of unspecified site of unspecified female breast: Secondary | ICD-10-CM

## 2013-05-01 NOTE — Progress Notes (Signed)
NAME: Anita Randolph       DOB: 11/20/1951           DATE: 05/01/2013       MRN:[id number]  CC:  Chief Complaint  Patient presents with  . Follow-up    preop lumpectomy    HPI: this patient has completed her neoadjuvant chemotherapy for A. Invasive ductal carcinoma right breast medial aspect. She comes in to confirm plans for surgery next week. She overall feels that she is doing well.  Past medical history information is reviewed in the electronic medical record,notdictated here.  EXAM: VITAL SIGNS:  BP 104/74  Pulse 72  Temp(Src) 97.8 F (36.6 C) (Temporal)  Resp 17  Ht 5' 4" (1.626 m)  Wt 176 lb (79.833 kg)  BMI 30.2 kg/m2  GENERAL:  The patient is alert, oriented, and generally healthy-appearing, NAD. Mood and affect are normal.  HEENT:  The head is normocephalic, the eyes nonicteric, the pupils were round regular and equal. EOMs are normal. Pharynx normal. Dentition good.  NECK:  The neck is supple and there are no masses or thyromegaly.  LUNGS: Normal respirations and clear to auscultation.  HEART: Regular rhythm, with no murmurs rubs or gallops. Pulses are intact carotid dorsalis pedis and posterior tibial. No significant varicosities are noted.  BREASTS:  they're normal bilaterally. There is no mass.  ABDOMEN: Soft, flat, and nontender. No masses or organomegaly is noted. No hernias are noted. Bowel sounds are normal.  EXTREMITIES:  Good range of motion, no edema.   Data reviewed: I have reviewed notes in the electronic medical record  Recent MRI: Findings: The previously seen enhancing mass located far  posteriorly within the lower inner quadrant of the right breast at  the 5 o'clock position has considerably decreased in size and  enhancement when compared to the prior studies. Residual linear  enhancement is present in the area of the previously seen mass.  This area of linear enhancement now measures 1.9 x 0.6 x 0.6 cm in  size. There are no new  areas of worrisome enhancement within  either breast. There is no evidence for axillary or internal  mammary adenopathy.  IMPRESSION:  Excellent response to neoadjuvant chemotherapy as discussed above.  RECOMMENDATION:  Treatment plan  IMP: excellent response to neoadjuvant chemotherapy  PLAN: wire localized right lumpectomy and sentinel node evaluation as scheduled next week. I reviewed the plans risks and complications again I think all questions have been answered.  Amarria Andreasen J 05/01/2013   

## 2013-05-01 NOTE — Progress Notes (Signed)
Finished chemo-feels better-labs done cc-04/24/13-hgb 9.3

## 2013-05-01 NOTE — Patient Instructions (Signed)
I will see you for surgery next week. Call or send me a message if you have any questions

## 2013-05-02 ENCOUNTER — Other Ambulatory Visit (HOSPITAL_BASED_OUTPATIENT_CLINIC_OR_DEPARTMENT_OTHER): Payer: 59 | Admitting: Lab

## 2013-05-02 ENCOUNTER — Ambulatory Visit (HOSPITAL_BASED_OUTPATIENT_CLINIC_OR_DEPARTMENT_OTHER): Payer: 59

## 2013-05-02 VITALS — BP 129/69 | HR 103 | Temp 98.0°F | Resp 18

## 2013-05-02 DIAGNOSIS — C50911 Malignant neoplasm of unspecified site of right female breast: Secondary | ICD-10-CM

## 2013-05-02 DIAGNOSIS — C50319 Malignant neoplasm of lower-inner quadrant of unspecified female breast: Secondary | ICD-10-CM

## 2013-05-02 DIAGNOSIS — Z5112 Encounter for antineoplastic immunotherapy: Secondary | ICD-10-CM

## 2013-05-02 LAB — COMPREHENSIVE METABOLIC PANEL (CC13)
ALT: 37 U/L (ref 0–55)
AST: 30 U/L (ref 5–34)
Alkaline Phosphatase: 62 U/L (ref 40–150)
BUN: 7.2 mg/dL (ref 7.0–26.0)
Calcium: 8 mg/dL — ABNORMAL LOW (ref 8.4–10.4)
Chloride: 109 mEq/L — ABNORMAL HIGH (ref 98–107)
Creatinine: 0.9 mg/dL (ref 0.6–1.1)
Total Bilirubin: 0.35 mg/dL (ref 0.20–1.20)

## 2013-05-02 LAB — CBC WITH DIFFERENTIAL/PLATELET
BASO%: 0.7 % (ref 0.0–2.0)
EOS%: 4.1 % (ref 0.0–7.0)
HCT: 27.6 % — ABNORMAL LOW (ref 34.8–46.6)
LYMPH%: 24.7 % (ref 14.0–49.7)
MCH: 30.7 pg (ref 25.1–34.0)
MCHC: 31.9 g/dL (ref 31.5–36.0)
MONO#: 0.5 10*3/uL (ref 0.1–0.9)
NEUT%: 61.3 % (ref 38.4–76.8)
Platelets: 220 10*3/uL (ref 145–400)
RBC: 2.87 10*6/uL — ABNORMAL LOW (ref 3.70–5.45)
WBC: 5.4 10*3/uL (ref 3.9–10.3)
nRBC: 0 % (ref 0–0)

## 2013-05-02 MED ORDER — SODIUM CHLORIDE 0.9 % IJ SOLN
10.0000 mL | INTRAMUSCULAR | Status: DC | PRN
  Administered 2013-05-02: 10 mL
  Filled 2013-05-02: qty 10

## 2013-05-02 MED ORDER — ACETAMINOPHEN 325 MG PO TABS
650.0000 mg | ORAL_TABLET | Freq: Once | ORAL | Status: AC
  Administered 2013-05-02: 650 mg via ORAL

## 2013-05-02 MED ORDER — TRASTUZUMAB CHEMO INJECTION 440 MG
6.0000 mg/kg | Freq: Once | INTRAVENOUS | Status: AC
  Administered 2013-05-02: 462 mg via INTRAVENOUS
  Filled 2013-05-02: qty 22

## 2013-05-02 MED ORDER — SODIUM CHLORIDE 0.9 % IV SOLN
Freq: Once | INTRAVENOUS | Status: AC
  Administered 2013-05-02: 11:00:00 via INTRAVENOUS

## 2013-05-02 MED ORDER — HEPARIN SOD (PORK) LOCK FLUSH 100 UNIT/ML IV SOLN
500.0000 [IU] | Freq: Once | INTRAVENOUS | Status: AC | PRN
  Administered 2013-05-02: 500 [IU]
  Filled 2013-05-02: qty 5

## 2013-05-02 NOTE — Patient Instructions (Signed)
[facility] Discharge Instructions for Patients Receiving Chemotherapy  Today you received the following chemotherapy agents Herceptin.  To help prevent nausea and vomiting after your treatment, we encourage you to take your nausea medication as prescribed.   If you develop nausea and vomiting that is not controlled by your nausea medication, call the clinic.   BELOW ARE SYMPTOMS THAT SHOULD BE REPORTED IMMEDIATELY:  *FEVER GREATER THAN 100.5 F  *CHILLS WITH OR WITHOUT FEVER  NAUSEA AND VOMITING THAT IS NOT CONTROLLED WITH YOUR NAUSEA MEDICATION  *UNUSUAL SHORTNESS OF BREATH  *UNUSUAL BRUISING OR BLEEDING  TENDERNESS IN MOUTH AND THROAT WITH OR WITHOUT PRESENCE OF ULCERS  *URINARY PROBLEMS  *BOWEL PROBLEMS  UNUSUAL RASH Items with * indicate a potential emergency and should be followed up as soon as possible.  Feel free to call the clinic you have any questions or concerns. The clinic phone number is [phone].    

## 2013-05-07 ENCOUNTER — Encounter (HOSPITAL_BASED_OUTPATIENT_CLINIC_OR_DEPARTMENT_OTHER): Payer: Self-pay | Admitting: *Deleted

## 2013-05-07 ENCOUNTER — Encounter (HOSPITAL_COMMUNITY)
Admission: RE | Admit: 2013-05-07 | Discharge: 2013-05-07 | Disposition: A | Discharge status: Home / Self Care | Payer: 59 | Source: Ambulatory Visit | Attending: Surgery | Admitting: Surgery

## 2013-05-07 ENCOUNTER — Encounter (HOSPITAL_BASED_OUTPATIENT_CLINIC_OR_DEPARTMENT_OTHER)
Admission: RE | Disposition: A | Discharge status: Home / Self Care | Payer: Self-pay | Source: Ambulatory Visit | Attending: Surgery

## 2013-05-07 ENCOUNTER — Ambulatory Visit (HOSPITAL_BASED_OUTPATIENT_CLINIC_OR_DEPARTMENT_OTHER)
Admission: RE | Admit: 2013-05-07 | Discharge: 2013-05-07 | Disposition: A | Discharge status: Home / Self Care | Payer: 59 | Source: Ambulatory Visit | Attending: Surgery | Admitting: Surgery

## 2013-05-07 ENCOUNTER — Ambulatory Visit
Admission: RE | Admit: 2013-05-07 | Discharge: 2013-05-07 | Disposition: A | Discharge status: Home / Self Care | Payer: 59 | Source: Ambulatory Visit | Attending: Surgery | Admitting: Surgery

## 2013-05-07 ENCOUNTER — Encounter (HOSPITAL_BASED_OUTPATIENT_CLINIC_OR_DEPARTMENT_OTHER): Payer: Self-pay | Admitting: Anesthesiology

## 2013-05-07 ENCOUNTER — Ambulatory Visit (HOSPITAL_BASED_OUTPATIENT_CLINIC_OR_DEPARTMENT_OTHER): Payer: 59 | Admitting: Anesthesiology

## 2013-05-07 DIAGNOSIS — C50911 Malignant neoplasm of unspecified site of right female breast: Secondary | ICD-10-CM

## 2013-05-07 DIAGNOSIS — C50919 Malignant neoplasm of unspecified site of unspecified female breast: Secondary | ICD-10-CM | POA: Insufficient documentation

## 2013-05-07 HISTORY — PX: BREAST LUMPECTOMY WITH NEEDLE LOCALIZATION AND AXILLARY SENTINEL LYMPH NODE BX: SHX5760

## 2013-05-07 HISTORY — DX: Myoneural disorder, unspecified: G70.9

## 2013-05-07 HISTORY — DX: Gastro-esophageal reflux disease without esophagitis: K21.9

## 2013-05-07 SURGERY — BREAST LUMPECTOMY WITH NEEDLE LOCALIZATION AND AXILLARY SENTINEL LYMPH NODE BX
Anesthesia: General | Site: Breast | Laterality: Right | Wound class: Clean

## 2013-05-07 MED ORDER — DEXAMETHASONE SODIUM PHOSPHATE 4 MG/ML IJ SOLN
INTRAMUSCULAR | Status: DC | PRN
  Administered 2013-05-07: 10 mg via INTRAVENOUS

## 2013-05-07 MED ORDER — TECHNETIUM TC 99M SULFUR COLLOID FILTERED
1.0000 | Freq: Once | INTRAVENOUS | Status: AC | PRN
  Administered 2013-05-07: 1 via INTRADERMAL

## 2013-05-07 MED ORDER — BUPIVACAINE HCL (PF) 0.25 % IJ SOLN
INTRAMUSCULAR | Status: DC | PRN
  Administered 2013-05-07: 30 mL

## 2013-05-07 MED ORDER — OXYCODONE HCL 5 MG PO TABS: 5.0000 mg | ORAL_TABLET | Freq: Once | ORAL | Status: DC | PRN

## 2013-05-07 MED ORDER — OXYCODONE HCL 5 MG/5ML PO SOLN: 5.0000 mg | Freq: Once | ORAL | Status: DC | PRN

## 2013-05-07 MED ORDER — CEFAZOLIN SODIUM-DEXTROSE 2-3 GM-% IV SOLR
2.0000 g | INTRAVENOUS | Status: AC
  Administered 2013-05-07: 2 g via INTRAVENOUS

## 2013-05-07 MED ORDER — PROPOFOL 10 MG/ML IV BOLUS
INTRAVENOUS | Status: DC | PRN
  Administered 2013-05-07: 180 mg via INTRAVENOUS

## 2013-05-07 MED ORDER — PROMETHAZINE HCL 25 MG/ML IJ SOLN: 6.2500 mg | INTRAMUSCULAR | Status: DC | PRN

## 2013-05-07 MED ORDER — OXYCODONE-ACETAMINOPHEN 5-325 MG PO TABS: 1.0000 | ORAL_TABLET | ORAL | Status: DC | PRN

## 2013-05-07 MED ORDER — LACTATED RINGERS IV SOLN
INTRAVENOUS | Status: DC
  Administered 2013-05-07 (×2): via INTRAVENOUS

## 2013-05-07 MED ORDER — ONDANSETRON HCL 4 MG/2ML IJ SOLN
INTRAMUSCULAR | Status: DC | PRN
  Administered 2013-05-07: 4 mg via INTRAVENOUS

## 2013-05-07 MED ORDER — MIDAZOLAM HCL 2 MG/2ML IJ SOLN
1.0000 mg | INTRAMUSCULAR | Status: DC | PRN
  Administered 2013-05-07: 1 mg via INTRAVENOUS

## 2013-05-07 MED ORDER — MIDAZOLAM HCL 5 MG/5ML IJ SOLN
INTRAMUSCULAR | Status: DC | PRN
  Administered 2013-05-07: 2 mg via INTRAVENOUS

## 2013-05-07 MED ORDER — CHLORHEXIDINE GLUCONATE 4 % EX LIQD: 1.0000 "application " | Freq: Once | CUTANEOUS | Status: DC

## 2013-05-07 MED ORDER — LIDOCAINE HCL (CARDIAC) 20 MG/ML IV SOLN
INTRAVENOUS | Status: DC | PRN
  Administered 2013-05-07: 100 mg via INTRAVENOUS

## 2013-05-07 MED ORDER — FENTANYL CITRATE 0.05 MG/ML IJ SOLN
INTRAMUSCULAR | Status: DC | PRN
  Administered 2013-05-07 (×2): 50 ug via INTRAVENOUS

## 2013-05-07 MED ORDER — SODIUM CHLORIDE 0.9 % IJ SOLN
INTRAMUSCULAR | Status: DC | PRN
  Administered 2013-05-07: 12:00:00 via INTRAMUSCULAR

## 2013-05-07 MED ORDER — FENTANYL CITRATE 0.05 MG/ML IJ SOLN
50.0000 ug | INTRAMUSCULAR | Status: DC | PRN
  Administered 2013-05-07: 50 ug via INTRAVENOUS

## 2013-05-07 MED ORDER — HYDROMORPHONE HCL PF 1 MG/ML IJ SOLN
0.2500 mg | INTRAMUSCULAR | Status: DC | PRN
  Administered 2013-05-07 (×3): 0.5 mg via INTRAVENOUS

## 2013-05-07 SURGICAL SUPPLY — 68 items
ADH SKN CLS APL DERMABOND .7 (GAUZE/BANDAGES/DRESSINGS) ×1
APPLIER CLIP 11 MED OPEN (CLIP)
APPLIER CLIP 9.375 MED OPEN (MISCELLANEOUS)
APR CLP MED 11 20 MLT OPN (CLIP)
APR CLP MED 9.3 20 MLT OPN (MISCELLANEOUS)
BINDER BREAST XLRG (GAUZE/BANDAGES/DRESSINGS) ×1 IMPLANT
BLADE HEX COATED 2.75 (ELECTRODE) ×2 IMPLANT
BLADE SURG 15 STRL LF DISP TIS (BLADE) ×2 IMPLANT
BLADE SURG 15 STRL SS (BLADE) ×4
CANISTER SUCTION 1200CC (MISCELLANEOUS) ×2 IMPLANT
CHLORAPREP W/TINT 26ML (MISCELLANEOUS) ×2 IMPLANT
CLIP APPLIE 11 MED OPEN (CLIP) IMPLANT
CLIP APPLIE 9.375 MED OPEN (MISCELLANEOUS) IMPLANT
CLIP TI MEDIUM 6 (CLIP) IMPLANT
CLIP TI WIDE RED SMALL 6 (CLIP) ×2 IMPLANT
CLOTH BEACON ORANGE TIMEOUT ST (SAFETY) ×2 IMPLANT
COVER MAYO STAND STRL (DRAPES) ×2 IMPLANT
COVER PROBE 5X48 (MISCELLANEOUS)
COVER PROBE W GEL 5X96 (DRAPES) ×2 IMPLANT
COVER TABLE BACK 60X90 (DRAPES) ×2 IMPLANT
DECANTER SPIKE VIAL GLASS SM (MISCELLANEOUS) IMPLANT
DERMABOND ADVANCED (GAUZE/BANDAGES/DRESSINGS) ×1
DERMABOND ADVANCED .7 DNX12 (GAUZE/BANDAGES/DRESSINGS) ×2 IMPLANT
DEVICE DUBIN W/COMP PLATE 8390 (MISCELLANEOUS) ×1 IMPLANT
DRAIN CHANNEL 19F RND (DRAIN) IMPLANT
DRAPE LAPAROSCOPIC ABDOMINAL (DRAPES) ×2 IMPLANT
DRAPE SURG 17X23 STRL (DRAPES) ×1 IMPLANT
DRAPE UTILITY XL STRL (DRAPES) ×2 IMPLANT
DRSG EMULSION OIL 3X3 NADH (GAUZE/BANDAGES/DRESSINGS) ×2 IMPLANT
DRSG PAD ABDOMINAL 8X10 ST (GAUZE/BANDAGES/DRESSINGS) ×1 IMPLANT
ELECT BLADE 4.0 EZ CLEAN MEGAD (MISCELLANEOUS)
ELECT REM PT RETURN 9FT ADLT (ELECTROSURGICAL) ×2
ELECTRODE BLDE 4.0 EZ CLN MEGD (MISCELLANEOUS) IMPLANT
ELECTRODE REM PT RTRN 9FT ADLT (ELECTROSURGICAL) ×1 IMPLANT
EVACUATOR SILICONE 100CC (DRAIN) IMPLANT
GLOVE BIO SURGEON STRL SZ 6.5 (GLOVE) ×1 IMPLANT
GLOVE BIOGEL PI IND STRL 6.5 (GLOVE) IMPLANT
GLOVE BIOGEL PI INDICATOR 6.5 (GLOVE) ×1
GLOVE EUDERMIC 7 POWDERFREE (GLOVE) ×2 IMPLANT
GLOVE SURG SS PI 7.0 STRL IVOR (GLOVE) ×1 IMPLANT
GOWN PREVENTION PLUS XLARGE (GOWN DISPOSABLE) ×5 IMPLANT
KIT CVR 48X5XPRB PLUP LF (MISCELLANEOUS) IMPLANT
KIT MARKER MARGIN INK (KITS) ×2 IMPLANT
NDL HYPO 25X1 1.5 SAFETY (NEEDLE) ×2 IMPLANT
NDL SAFETY ECLIPSE 18X1.5 (NEEDLE) ×1 IMPLANT
NEEDLE HYPO 18GX1.5 SHARP (NEEDLE) ×2
NEEDLE HYPO 25X1 1.5 SAFETY (NEEDLE) ×4 IMPLANT
NS IRRIG 1000ML POUR BTL (IV SOLUTION) ×2 IMPLANT
PACK BASIN DAY SURGERY FS (CUSTOM PROCEDURE TRAY) ×2 IMPLANT
PENCIL BUTTON HOLSTER BLD 10FT (ELECTRODE) ×2 IMPLANT
PIN SAFETY STERILE (MISCELLANEOUS) IMPLANT
SHEET MEDIUM DRAPE 40X70 STRL (DRAPES) ×2 IMPLANT
SLEEVE SCD COMPRESS KNEE MED (MISCELLANEOUS) ×2 IMPLANT
SPONGE GAUZE 4X4 12PLY (GAUZE/BANDAGES/DRESSINGS) IMPLANT
SPONGE INTESTINAL PEANUT (DISPOSABLE) IMPLANT
SPONGE LAP 18X18 X RAY DECT (DISPOSABLE) IMPLANT
SPONGE LAP 4X18 X RAY DECT (DISPOSABLE) ×2 IMPLANT
SUT ETHILON 2 0 FS 18 (SUTURE) IMPLANT
SUT ETHILON 3 0 FSL (SUTURE) IMPLANT
SUT MNCRL AB 4-0 PS2 18 (SUTURE) ×4 IMPLANT
SUT VIC AB 4-0 BRD 54 (SUTURE) IMPLANT
SUT VICRYL 3-0 CR8 SH (SUTURE) ×4 IMPLANT
SYR BULB 3OZ (MISCELLANEOUS) ×2 IMPLANT
SYR CONTROL 10ML LL (SYRINGE) ×4 IMPLANT
TOWEL OR 17X24 6PK STRL BLUE (TOWEL DISPOSABLE) ×2 IMPLANT
TOWEL OR NON WOVEN STRL DISP B (DISPOSABLE) ×2 IMPLANT
TUBE CONNECTING 20X1/4 (TUBING) ×2 IMPLANT
YANKAUER SUCT BULB TIP NO VENT (SUCTIONS) ×2 IMPLANT

## 2013-05-07 NOTE — Progress Notes (Signed)
Emotional support during breast injections °

## 2013-05-07 NOTE — Interval H&P Note (Signed)
History and Physical Interval Note:  05/07/2013 11:58 AM  Anita Randolph  has presented today for surgery, with the diagnosis of right breast cancer  The various methods of treatment have been discussed with the patient and family. After consideration of risks, benefits and other options for treatment, the patient has consented to  Procedure(s): NEEDLE LOCALIZATION RIGHT BREAST LUMPECTOMY AND   SENTINEL LYMPH NODE   (Right) as a surgical intervention .  The patient's history has been reviewed, patient examined, no change in status, stable for surgery.  I have reviewed the patient's chart and labs.  Questions were answered to the patient's satisfaction.     Gabryel Files J

## 2013-05-07 NOTE — H&P (View-Only) (Signed)
NAME: Anita Randolph       DOB: 11-03-1951           DATE: 05/01/2013       OZH:[id number]  CC:  Chief Complaint  Patient presents with  . Follow-up    preop lumpectomy    HPI: this patient has completed her neoadjuvant chemotherapy for A. Invasive ductal carcinoma right breast medial aspect. She comes in to confirm plans for surgery next week. She overall feels that she is doing well.  Past medical history information is reviewed in the electronic medical record,notdictated here.  EXAM: VITAL SIGNS:  BP 104/74  Pulse 72  Temp(Src) 97.8 F (36.6 C) (Temporal)  Resp 17  Ht 5\' 4"  (1.626 m)  Wt 176 lb (79.833 kg)  BMI 30.2 kg/m2  GENERAL:  The patient is alert, oriented, and generally healthy-appearing, NAD. Mood and affect are normal.  HEENT:  The head is normocephalic, the eyes nonicteric, the pupils were round regular and equal. EOMs are normal. Pharynx normal. Dentition good.  NECK:  The neck is supple and there are no masses or thyromegaly.  LUNGS: Normal respirations and clear to auscultation.  HEART: Regular rhythm, with no murmurs rubs or gallops. Pulses are intact carotid dorsalis pedis and posterior tibial. No significant varicosities are noted.  BREASTS:  they're normal bilaterally. There is no mass.  ABDOMEN: Soft, flat, and nontender. No masses or organomegaly is noted. No hernias are noted. Bowel sounds are normal.  EXTREMITIES:  Good range of motion, no edema.   Data reviewed: I have reviewed notes in the electronic medical record  Recent MRI: Findings: The previously seen enhancing mass located far  posteriorly within the lower inner quadrant of the right breast at  the 5 o'clock position has considerably decreased in size and  enhancement when compared to the prior studies. Residual linear  enhancement is present in the area of the previously seen mass.  This area of linear enhancement now measures 1.9 x 0.6 x 0.6 cm in  size. There are no new  areas of worrisome enhancement within  either breast. There is no evidence for axillary or internal  mammary adenopathy.  IMPRESSION:  Excellent response to neoadjuvant chemotherapy as discussed above.  RECOMMENDATION:  Treatment plan  IMP: excellent response to neoadjuvant chemotherapy  PLAN: wire localized right lumpectomy and sentinel node evaluation as scheduled next week. I reviewed the plans risks and complications again I think all questions have been answered.  Lora Chavers J 05/01/2013

## 2013-05-07 NOTE — Transfer of Care (Signed)
Immediate Anesthesia Transfer of Care Note  Patient: Anita Randolph  Procedure(s) Performed: Procedure(s): NEEDLE LOCALIZATION RIGHT BREAST LUMPECTOMY AND   SENTINEL LYMPH NODE   (Right)  Patient Location: PACU  Anesthesia Type:General  Level of Consciousness: sedated and patient cooperative  Airway & Oxygen Therapy: Patient Spontanous Breathing and Patient connected to face mask oxygen  Post-op Assessment: Report given to PACU RN and Post -op Vital signs reviewed and stable  Post vital signs: Reviewed and stable  Complications: No apparent anesthesia complications

## 2013-05-07 NOTE — Op Note (Signed)
Anita Randolph [birth year] [id number] 03/28/2013  Preoperative diagnosis: right breast cancer, status post neoadjuvant chemotherapy  Postoperative diagnosis: same  Procedure: wire localized right partial mastectomy with blue dye injection and right axillary sentinel lymph node dissection (3 lymph nodes removed)  Surgeon: Currie Paris, MD, FACS   Anesthesia: General   Clinical History and Indications: this patient presented several months ago with a palpable mass in the right breast lower inner quadrant which was carcinoma by biopsy. She underwent neoadjuvant chemotherapy with marked improvement and now comes for lumpectomy and sentinel lymph node dissection    Description of Procedure: I saw the patient in the preoperative area, confirmed the plans, and marked the right breast as the operative site.I reviewed the wire localizing films as well.  The patient was taken to the operating room and after satisfactory general anesthesia was obtained a timeout was performed. I then injected 5 cc of dilute methylene blue in the subareolar area and this was massaged in thoroughly. The breast was then prepped and draped.  The guidewire and are very medially, almost over the sternum and tracks towards the nipple. I made an incision starting at the guidewire and going directly transversely. I then elevated very thin skin flaps superiorly, medially, and inferiorly and then divided the tissue down to the chest wall. I then began to remove the specimen by dividing and separating it from the pectoralis muscle deep. As I worked from medial to lateral I thought was well beyond the tip of the guidewire and then divided the tissue at the lateral extent of my lumpectomy. The specimen mammogram showed the clip in the middle of the specimen.  I then elevated the breast tissue off of the underlying muscle so I could close it easier, and with less deformity. I infiltrated 20 cc 0.25% plain Marcaine to help with  postop pain relief. I marked all the margins with clips. I then closed the deeper tissues with interrupted 3-0 Vicryl, then a more superficial breast tissue with 3-0 Vicryl, and the skin with 4-0 Monocryl subcuticular and applied Dermabond.  I then used the neoprobe to identify a hot area in the right axilla and made an incision directly over that period I divided soaked his tissues and entered the axillary fat pad. Using neoprobe I dissected a little deeper and found a blue lymph node and removed this. It had counts of 3300. Further dissection showed an adjacent small lymph node with counts of about 200 but no blue dye and additional dissection about a third blue lymph node with counts of about 500. These too were also removed and all 3 sets pathology. There are no other hot areas, no other blue dye, and no lymph nodes that were palpably abnormal. I therefore injected 10 cc of 0.25% plain Marcaine, and closed with layers of 3-0 Vicryl, followed by 4-0 Monocryl subcuticular and Dermabond.  The patient tolerated the procedure well. There were no operative complications. Counts were correct. Blood loss was minimal.  Currie Paris, MD, FACS 05/07/2013 1:41 PM

## 2013-05-07 NOTE — Anesthesia Procedure Notes (Signed)
Procedure Name: LMA Insertion Date/Time: 05/07/2013 12:24 PM Performed by: Gar Gibbon Pre-anesthesia Checklist: Patient identified, Emergency Drugs available, Suction available and Patient being monitored Patient Re-evaluated:Patient Re-evaluated prior to inductionOxygen Delivery Method: Circle System Utilized Preoxygenation: Pre-oxygenation with 100% oxygen Intubation Type: IV induction Ventilation: Mask ventilation without difficulty LMA: LMA inserted LMA Size: 3.0 Number of attempts: 1 Airway Equipment and Method: bite block Placement Confirmation: positive ETCO2 Tube secured with: Tape Dental Injury: Teeth and Oropharynx as per pre-operative assessment

## 2013-05-07 NOTE — Anesthesia Postprocedure Evaluation (Signed)
  Anesthesia Post-op Note  Patient: Anita Randolph  Procedure(s) Performed: Procedure(s): NEEDLE LOCALIZATION RIGHT BREAST LUMPECTOMY AND   SENTINEL LYMPH NODE   (Right)  Patient Location: PACU  Anesthesia Type:General  Level of Consciousness: awake and alert   Airway and Oxygen Therapy: Patient Spontanous Breathing  Post-op Pain: mild  Post-op Assessment: Post-op Vital signs reviewed, Patient's Cardiovascular Status Stable, Respiratory Function Stable, Patent Airway, No signs of Nausea or vomiting and Pain level controlled  Post-op Vital Signs: stable  Complications: No apparent anesthesia complications

## 2013-05-07 NOTE — Anesthesia Preprocedure Evaluation (Signed)
Anesthesia Evaluation  Patient identified by MRN, date of birth, ID band Patient awake    Reviewed: Allergy & Precautions, H&P , NPO status , Patient's Chart, lab work & pertinent test results  Airway Mallampati: II TM Distance: >3 FB Neck ROM: Full    Dental   Pulmonary former smoker,  breath sounds clear to auscultation        Cardiovascular hypertension, Rhythm:Regular Rate:Normal     Neuro/Psych    GI/Hepatic GERD-  ,  Endo/Other    Renal/GU      Musculoskeletal   Abdominal   Peds  Hematology   Anesthesia Other Findings   Reproductive/Obstetrics Breast ca                           Anesthesia Physical Anesthesia Plan  ASA: II  Anesthesia Plan: General   Post-op Pain Management:    Induction: Intravenous  Airway Management Planned: LMA  Additional Equipment:   Intra-op Plan:   Post-operative Plan: Extubation in OR  Informed Consent: I have reviewed the patients History and Physical, chart, labs and discussed the procedure including the risks, benefits and alternatives for the proposed anesthesia with the patient or authorized representative who has indicated his/her understanding and acceptance.     Plan Discussed with: CRNA and Surgeon  Anesthesia Plan Comments:         Anesthesia Quick Evaluation

## 2013-05-08 ENCOUNTER — Telehealth (INDEPENDENT_AMBULATORY_CARE_PROVIDER_SITE_OTHER): Payer: Self-pay

## 2013-05-08 ENCOUNTER — Encounter (HOSPITAL_BASED_OUTPATIENT_CLINIC_OR_DEPARTMENT_OTHER): Payer: Self-pay | Admitting: Surgery

## 2013-05-08 ENCOUNTER — Telehealth: Payer: Self-pay | Admitting: *Deleted

## 2013-05-08 NOTE — Telephone Encounter (Signed)
Per Dr. Darnelle Catalan, could be from IV fluids from lumpectomy procedure. No need to act on this now. Call if this persists next week or she develops any cardiac s/s. Limit salt intake. Patient notified.

## 2013-05-08 NOTE — Telephone Encounter (Signed)
Patient states she has swollen hands and feet. Denies SOB, chest pain .Her PCP is Dr. Lynford Citizen . She States she does not take blood pressure medication or lasix. Advised her to keep her feet elevated and lower her salt intake . She will need to call Dr. Laurine Blazer if symptoms continues

## 2013-05-08 NOTE — Telephone Encounter (Signed)
Reports 2 day history of bilateral hands and feet swelling-unable to wear her rings and some shoes. No other swelling. Had her lumpectomy yesterday-feeling fine. No cough or congestion or change in dyspnea-may get a little short of breath going up stairs quickly. Does not feel her heart is racing. No med changes or excess sodium intake reported. Reports MD has instructed her to call if this ever happens. Last ECHO 03/25/13 shows EF 55-60%. Q 21 day Herceptin since January.

## 2013-05-11 ENCOUNTER — Encounter (INDEPENDENT_AMBULATORY_CARE_PROVIDER_SITE_OTHER): Payer: Self-pay | Admitting: Surgery

## 2013-05-13 ENCOUNTER — Encounter (INDEPENDENT_AMBULATORY_CARE_PROVIDER_SITE_OTHER): Payer: Self-pay | Admitting: Surgery

## 2013-05-13 ENCOUNTER — Telehealth (INDEPENDENT_AMBULATORY_CARE_PROVIDER_SITE_OTHER): Payer: Self-pay | Admitting: Surgery

## 2013-05-13 ENCOUNTER — Telehealth (INDEPENDENT_AMBULATORY_CARE_PROVIDER_SITE_OTHER): Payer: Self-pay

## 2013-05-13 ENCOUNTER — Telehealth (INDEPENDENT_AMBULATORY_CARE_PROVIDER_SITE_OTHER): Payer: Self-pay | Admitting: General Surgery

## 2013-05-13 NOTE — Telephone Encounter (Signed)
I called her and reviewed the path report with her > she had a CPR, negative nodes.  She is doing well post op with essentially no pain or problems from the surgery. She is seeing me in the office in about two weeks

## 2013-05-13 NOTE — Telephone Encounter (Signed)
Dr. Jamey Ripa said whole tumor removed - no margins involved.

## 2013-05-13 NOTE — Telephone Encounter (Signed)
Patient called in for results of Pathology. Patient notified Pathology should be reviewed by her MD, and then will be contacted with the results.

## 2013-05-17 ENCOUNTER — Other Ambulatory Visit: Payer: Self-pay | Admitting: Oncology

## 2013-05-21 ENCOUNTER — Encounter: Payer: Self-pay | Admitting: Radiation Oncology

## 2013-05-21 NOTE — Progress Notes (Addendum)
Location of Breast Cancer: 11/15/12 Biopsy= Right lower inner quadrqant  Histology per Pathology Report: 11/15/12:DiagnosisBreast, right, needle core biopsy- INVASIVE DUCTAL CARCINOMA.  Receptor Status: ER+), PR (-), Her2-neu (+)  Did patient present with symptoms : patient felt mass right breast,mammography then ordered at [facility],  Past/Anticipated interventions by surgeon, if ZOX:WRUEA    : 05/07/13 :Diagnosis1. Breast, lumpectomy, Right- BREAST PARENCHYMA WITH CHANGES CONSISTENT WITH NEOADJUVANT TREATMENT.- CARCINOMA IS NOT IDENTIFIED.- SEE COMMENT.2. Lymph node, sentinel, biopsy, Right axillary #1- THERE IS NO EVIDENCE OF CARCINOMA IN 1 OF 1 LYMPH NODE (0/1).3. Lymph node, sentinel, biopsy, Right axillary #2- THERE IS NO EVIDENCE OF CARCINOMA IN 1 OF 1 LYMPH NODE (0/1)4. Lymph node, sentinel, biopsy, Right axillary #3- THERE IS NO EVIDENCE OF CARCINOMA IN 1 OF 1 LYMPH NODE (0/1)5. Lymph node, sentinel, biopsy, Right axillary#4- THERE IS NO EVIDENCE OF CARCINOMA IN 1 OF 1 LYMPH NODE (0/1). Dr. Cyndia Bent  Past/Anticipated interventions by medical oncology, if any: Chemotherapy =completed 6 cycles docetaxel/carboplatin/trastuzumab 04/11/13,trastuzumab to continue 1 year through January 2015 and to start antiestrogen once her rad txs done  Lymphedema issues, if any:  no   Pain issues, if any:  no   SAFETY ISSUES:  Prior radiation? no  Pacemaker/ICD? no  Possible current pregnancy?no  Is the patient on methotrexate? no  Current Complaints / other details:  Interior and spatial designer for [employer], Married, GxP2, menarche age 46,1st live birth age 45, menopause  2006;HRT until 2010,, former smoker, quit 1966, Mother had premenopausal breast cancer  Dx age 35,but is a 50+ year survivor, father bladder cancer,CAD and ruptured aneurysm,lived to age 59, ,Maternal Aunt breast cancer, patient is of Ashkenazi Jewish descent  Allergies:NKDA    Lowella Petties, RN 05/21/2013,8:03  AM

## 2013-05-22 ENCOUNTER — Ambulatory Visit
Admission: RE | Admit: 2013-05-22 | Discharge: 2013-05-22 | Disposition: A | Discharge status: Home / Self Care | Payer: 59 | Source: Ambulatory Visit | Attending: Radiation Oncology | Admitting: Radiation Oncology

## 2013-05-22 ENCOUNTER — Encounter: Payer: Self-pay | Admitting: Radiation Oncology

## 2013-05-22 VITALS — BP 113/74 | HR 109 | Temp 97.9°F | Resp 20 | Ht 64.0 in | Wt 176.7 lb

## 2013-05-22 DIAGNOSIS — C50911 Malignant neoplasm of unspecified site of right female breast: Secondary | ICD-10-CM

## 2013-05-22 DIAGNOSIS — C50919 Malignant neoplasm of unspecified site of unspecified female breast: Secondary | ICD-10-CM | POA: Insufficient documentation

## 2013-05-22 DIAGNOSIS — I10 Essential (primary) hypertension: Secondary | ICD-10-CM | POA: Insufficient documentation

## 2013-05-22 DIAGNOSIS — Z9221 Personal history of antineoplastic chemotherapy: Secondary | ICD-10-CM | POA: Insufficient documentation

## 2013-05-22 DIAGNOSIS — K219 Gastro-esophageal reflux disease without esophagitis: Secondary | ICD-10-CM | POA: Insufficient documentation

## 2013-05-22 DIAGNOSIS — Z170 Estrogen receptor positive status [ER+]: Secondary | ICD-10-CM | POA: Insufficient documentation

## 2013-05-22 HISTORY — DX: Anemia, unspecified: D64.9

## 2013-05-22 NOTE — Progress Notes (Signed)
Please see the Nurse Progress Note in the MD Initial Consult Encounter for this patient. 

## 2013-05-22 NOTE — Progress Notes (Signed)
Radiation Oncology         [phone]) [phone] ________________________________  Initial outpatient Consultation - Date: 05/22/2013   Name: Anita Randolph MRN: [id number]   DOB: 1951/02/26  REFERRING PHYSICIAN: Magrinat, Valentino Hue, MD  DIAGNOSIS:   HISTORY OF PRESENT ILLNESS::Anita Randolph is a 62 y.o. female  breast mass. Mammography confirmed a mass in the lower inner breast. 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 Progesterone receptor negative Ki-67 79% HER-2 positive bilateral breast MRI on 12/26 13 showed a right breast mass measuring 3.2 cm with possible involvement of the underlying pectoralis muscle. No disease was noted in the left breast. No concerning axillary supraclavicular or internal mammary adenopathy was noted. She met with medical oncology and surgery and elected for neoadjuvant chemotherapy. She underwent neoadjuvant Taxotere carboplatin and Herceptin which she completed on 04/11/2013. She underwent lumpectomy and sentinel lymph node biopsy on 05/07/2013 showing a complete pathologic response with zero out of four lymph nodes positive. She is recovered well from her surgery. She is back to work full-time. She has minimal irritation in her axillary scar. She has appointment next week with Dr. Jamey Ripa. She will continue on Herceptin for one year. She had menarche at 48 with her first live child birth at 84. She is GX PT with menopause in 2006. She took hormone replacement until 2010. She is of Ashkanazi Jewish descent. He was genetically tested but was negative. She is referred by medical oncology for my opinion regarding radiation in the management of her disease.  PREVIOUS RADIATION THERAPY: No  PAST MEDICAL HISTORY:  has a past medical history of Cancer; Breast cancer (11/15/12); Neuromuscular disorder; Anemia; GERD (gastroesophageal reflux disease); and Hypertension.    PAST SURGICAL HISTORY: Past Surgical History  Procedure Laterality  Date  . Portacath placement  12/20/2012    Procedure: INSERTION PORT-A-CATH;  Surgeon: Currie Paris, MD;  Location: [facility] OR;  Service: General;  Laterality: N/A;  . Tonsillectomy    . Arm skin lesion biopsy / excision  2011    nodular faciitis lt arm  . Colonoscopy    . Breast lumpectomy with needle localization and axillary sentinel lymph node bx Right 05/07/2013    Procedure: NEEDLE LOCALIZATION RIGHT BREAST LUMPECTOMY AND   SENTINEL LYMPH NODE  ;  Surgeon: Currie Paris, MD;  Location: [facility];  Service: General;  Laterality: Right;    FAMILY HISTORY:  Family History  Problem Relation Age of Onset  . Breast cancer Mother 64  . Heart disease Father   . Bladder Cancer Father 28  . Breast cancer Maternal Aunt     diagnosed in her 47s  . Multiple sclerosis Paternal Aunt   . Heart disease Maternal Grandmother   . Mental retardation Cousin     maternal cousins; brothers, sons of aunt with breast cancer    SOCIAL HISTORY:  History  Substance Use Topics  . Smoking status: Former Smoker -- 1.00 packs/day for 4 years    Types: Cigarettes    Quit date: 11/30/1974  . Smokeless tobacco: Never Used  . Alcohol Use: Yes     Comment: glass of wine on weekend    ALLERGIES: Review of patient's allergies indicates no known allergies.  MEDICATIONS:  Current Outpatient Prescriptions  Medication Sig Dispense Refill  . calcium-vitamin D (OSCAL WITH D) 500-200 MG-UNIT per tablet Take 1 tablet by mouth 2 (two) times daily. 600 vit d      .  simvastatin (ZOCOR) 40 MG tablet Take 40 mg by mouth daily.      . Diphenhyd-Hydrocort-Nystatin (FIRST-DUKES MOUTHWASH) SUSP 5-63ml QID prn - swish - swallow or spit  240 mL  1  . diphenoxylate-atropine (LOMOTIL) 2.5-0.025 MG per tablet Take 1 tablet by mouth every 4 (four) hours as needed for diarrhea or loose stools (up to 6 per day).  30 tablet  0  . oxyCODONE-acetaminophen (ROXICET) 5-325 MG per tablet Take 1 tablet by mouth every  4 (four) hours as needed for pain.  30 tablet  0   No current facility-administered medications for this encounter.    REVIEW OF SYSTEMS:  A 15 point review of systems is documented in the electronic medical record. This was obtained by the nursing staff. However, I reviewed this with the patient to discuss relevant findings and make appropriate changes.  Pertinent items are noted in HPI.   PHYSICAL EXAM:  Filed Vitals:   05/22/13 1404  BP: 113/74  Pulse: 109  Temp: 97.9 F (36.6 C)  Resp: 20  .176 lb 11.2 oz (80.151 kg). She is a pleasant female in no distress sitting comfortably examining table. She has a well-healed incision in the medial aspect of the left breast. She is a well-healed incision in the right axilla. No palpable abnormalities. There is some fluid in the central aspect of the right breast. She is alert minus x3. She has no lymphedema.  LABORATORY DATA:  Lab Results  Component Value Date   WBC 5.4 05/02/2013   HGB 8.8* 05/02/2013   HCT 27.6* 05/02/2013   MCV 96.2 05/02/2013   PLT 220 05/02/2013   Lab Results  Component Value Date   NA 144 05/02/2013   K 3.7 05/02/2013   CL 109* 05/02/2013   CO2 27 05/02/2013   Lab Results  Component Value Date   ALT 37 05/02/2013   AST 30 05/02/2013   ALKPHOS 62 05/02/2013   BILITOT 0.35 05/02/2013     RADIOGRAPHY: Mr Breast Bilateral W Wo Contrast  04/23/2013   *RADIOLOGY REPORT*  Clinical Data: Known right breast invasive ductal carcinoma. Assess response to neoadjuvant chemotherapy.  BILATERAL BREAST MRI WITH AND WITHOUT CONTRAST  Technique: Multiplanar, multisequence MR images of both breasts were obtained prior to and following the intravenous administration of 16ml of Multihance.  Three dimensional images were evaluated at the independent DynaCad workstation.  Comparison:  02/27/2013 and 11/22/2012 breast MRIs.  Findings: The previously seen enhancing mass located far posteriorly within the lower inner quadrant of the right breast at the 5  o'clock position has considerably decreased in size and enhancement when compared to the prior studies.  Residual linear enhancement is present in the area of the previously seen mass. This area of linear enhancement now measures 1.9 x 0.6 x 0.6 cm in size.  There are no new areas of worrisome enhancement within either breast.  There is no evidence for axillary or internal mammary adenopathy.  IMPRESSION: Excellent response to neoadjuvant chemotherapy as discussed above.  RECOMMENDATION: Treatment plan  THREE-DIMENSIONAL MR IMAGE RENDERING ON INDEPENDENT WORKSTATION:  Three-dimensional MR images were rendered by post-processing of the original MR data on an independent workstation.  The three- dimensional MR images were interpreted, and findings were reported in the accompanying complete MRI report for this study.  BI-RADS CATEGORY 6:  Known biopsy-proven malignancy - appropriate action should be taken.   Original Report Authenticated By: Rolla Plate, M.D.   Nm Sentinel Node Inj-no Rpt (breast)  05/07/2013  CLINICAL DATA: right breast cancer   Sulfur colloid was injected intradermally by the nuclear medicine  technologist for breast cancer sentinel node localization.    Mm Rt Plc Breast Loc Dev   1st Lesion  Inc Mammo Guide  05/07/2013   *RADIOLOGY REPORT*  Clinical Data:  Patient with a history of invasive ductal carcinoma right breast post neoadjuvant chemotherapy, who presents for localization prior to surgical excision.  NEEDLE LOCALIZATION WITH MAMMOGRAPHIC GUIDANCE AND SPECIMEN RADIOGRAPH  Comparison:  Previous exams.  Patient presents for needle localization prior to surgical excision.  I met with the patient and we discussed the procedure of needle localization including benefits and alternatives. We discussed the high likelihood of a successful procedure. We discussed the risks of the procedure, including infection, bleeding, tissue injury, and further surgery. Informed, written consent was  given. The usual time-out protocol was performed immediately prior to the procedure.  Using mammographic guidance, sterile technique, 2% lidocaine and a 7 cm modified Kopans needle, the targeted mass/clip/microcalcifications was localized using a medial to lateral approach.  The films are marked for Dr. Jamey Ripa.  Specimen radiograph was performed at , and confirms the targeted mass/clip with microcalcifications present in the tissue sample. The specimen is marked for pathology.  IMPRESSION: Needle localization right breast.  No apparent complications.   Original Report Authenticated By: Elberta Fortis, M.D.      IMPRESSION: [id number] invasive ductal carcinoma of the right breast  PLAN:  I spoke with the patienttoday regarding the role of radiation in the treatment of breast cancer.  We discussed the role of radiation and decreasing local failures in patients who undergo lumpectomy even in the setting of a pathologic complete response. We discussed the retrospective data out of M.D. Dareen Piano showing a high failure rate in patients who did not have radiation after having a lumpectomy for breast conservation and pathology consistent with the pathologic complete response. We discussed the use of prone positioning to decrease skin toxicity.. We discussed the process of simulation the placement tattoos. We discussed 6 weeks of treatment as an outpatient. . We discussed the possible side effects including but not limited to skin redness, fatigue, lung and heart damage. I've scheduled her for simulation on July 1. She will continue Herceptin under the care of medical oncology and will undergo adjuvant antiestrogen therapy at the completion of radiation.  I spent 30 minutes  face to face with the patient and more than 50% of that time was spent in counseling and/or coordination of care.   ------------------------------------------------  Lurline Hare, MD

## 2013-05-23 ENCOUNTER — Other Ambulatory Visit (HOSPITAL_BASED_OUTPATIENT_CLINIC_OR_DEPARTMENT_OTHER): Payer: 59 | Admitting: Lab

## 2013-05-23 ENCOUNTER — Ambulatory Visit (HOSPITAL_BASED_OUTPATIENT_CLINIC_OR_DEPARTMENT_OTHER): Payer: 59 | Admitting: Oncology

## 2013-05-23 ENCOUNTER — Telehealth: Payer: Self-pay | Admitting: Oncology

## 2013-05-23 ENCOUNTER — Ambulatory Visit (HOSPITAL_BASED_OUTPATIENT_CLINIC_OR_DEPARTMENT_OTHER): Payer: 59

## 2013-05-23 ENCOUNTER — Telehealth: Payer: Self-pay | Admitting: *Deleted

## 2013-05-23 VITALS — BP 139/83 | HR 88 | Temp 98.3°F | Resp 20 | Ht 64.0 in | Wt 176.0 lb

## 2013-05-23 DIAGNOSIS — C50319 Malignant neoplasm of lower-inner quadrant of unspecified female breast: Secondary | ICD-10-CM

## 2013-05-23 DIAGNOSIS — C50911 Malignant neoplasm of unspecified site of right female breast: Secondary | ICD-10-CM

## 2013-05-23 DIAGNOSIS — C50919 Malignant neoplasm of unspecified site of unspecified female breast: Secondary | ICD-10-CM

## 2013-05-23 DIAGNOSIS — Z5112 Encounter for antineoplastic immunotherapy: Secondary | ICD-10-CM

## 2013-05-23 DIAGNOSIS — G609 Hereditary and idiopathic neuropathy, unspecified: Secondary | ICD-10-CM

## 2013-05-23 LAB — CBC WITH DIFFERENTIAL/PLATELET
Basophils Absolute: 0.1 10*3/uL (ref 0.0–0.1)
EOS%: 2.9 % (ref 0.0–7.0)
HGB: 10.4 g/dL — ABNORMAL LOW (ref 11.6–15.9)
LYMPH%: 25.4 % (ref 14.0–49.7)
MCH: 29.5 pg (ref 25.1–34.0)
MCV: 92.1 fL (ref 79.5–101.0)
MONO%: 6.7 % (ref 0.0–14.0)
Platelets: 223 10*3/uL (ref 145–400)
RBC: 3.53 10*6/uL — ABNORMAL LOW (ref 3.70–5.45)
RDW: 14.9 % — ABNORMAL HIGH (ref 11.2–14.5)

## 2013-05-23 MED ORDER — SODIUM CHLORIDE 0.9 % IV SOLN
6.0000 mg/kg | Freq: Once | INTRAVENOUS | Status: AC
  Administered 2013-05-23: 462 mg via INTRAVENOUS
  Filled 2013-05-23: qty 22

## 2013-05-23 MED ORDER — SODIUM CHLORIDE 0.9 % IV SOLN
Freq: Once | INTRAVENOUS | Status: AC
  Administered 2013-05-23: 12:00:00 via INTRAVENOUS

## 2013-05-23 MED ORDER — HEPARIN SOD (PORK) LOCK FLUSH 100 UNIT/ML IV SOLN
500.0000 [IU] | Freq: Once | INTRAVENOUS | Status: AC | PRN
  Administered 2013-05-23: 500 [IU]
  Filled 2013-05-23: qty 5

## 2013-05-23 MED ORDER — SODIUM CHLORIDE 0.9 % IJ SOLN
10.0000 mL | INTRAMUSCULAR | Status: DC | PRN
Start: 2013-05-23 — End: 2013-05-23
  Administered 2013-05-23: 10 mL
  Filled 2013-05-23: qty 10

## 2013-05-23 MED ORDER — ACETAMINOPHEN 325 MG PO TABS
650.0000 mg | ORAL_TABLET | Freq: Once | ORAL | Status: AC
  Administered 2013-05-23: 650 mg via ORAL

## 2013-05-23 NOTE — Progress Notes (Signed)
ID: Anita Randolph   DOB: August 07, 1951  MR#: [id number]  WUJ#:[id number]  PCP: Mila Palmer GYN: Meredeth Ide SU: Cicero Duck OTHER MD: Arvilla Meres, Amy Swaziland, Rick Cornella   HISTORY OF PRESENT ILLNESS: Anita Randolph herself palpated a mass in her right breast 11/09/2012. She brought this to Dr. Harlene Salts attention and mammography was obtained at [facility], (I do not have that report today) confirming a mass in the lower inner quadrant of the right breast. 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  Bilateral breast MRI 11/22/2012 showed a far posterior right breast mass measuring 3.2 cm with no discernible fat plane between the mass and the underlying pectoralis major. There was extension of irregular enhancement into the superficial aspect of the cholesterol is muscle underlying the mass, but there was no evidence for chest wall involvement, internal mammary artery chain or axillary lymph node involvement, or anything else in the breast or the contralateral breast.  The patient's subsequent history is as detailed below  INTERVAL HISTORY: Anita Randolph returns today for followup of her locally advanced right breast carcinoma. Since her last visit here she had her definitive surgery, which showed a complete pathologic response.  REVIEW OF SYSTEMS: Her hair is just beginning to grow back. She thinks is going to be salt-and-pepper but can't yet tell. She still has some numbness and tingling particularly tingling in her toes, which she notices at bedtime. It does not keep her from sleeping or wake her up from sleep. She is working full-time. She is not having hot flashes. Her energy is better now that her hemoglobin is over 10. She still has altered taste. Otherwise a detailed review of systems today was noncontributory   PAST  MEDICAL HISTORY: Past Medical History  Diagnosis Date  . Cancer     breast  . Breast cancer 11/15/12    Right invasive ductal ca  . Neuromuscular disorder     tingling hands and feet from chemo-getting better  . Anemia     s/p chemotherapy  . GERD (gastroesophageal reflux disease)     only during chemotherapy  . Hypertension     off b/p 7 years ago, resolved at present    PAST SURGICAL HISTORY: Past Surgical History  Procedure Laterality Date  . Portacath placement  12/20/2012    Procedure: INSERTION PORT-A-CATH;  Surgeon: Currie Paris, MD;  Location: [facility] OR;  Service: General;  Laterality: N/A;  . Tonsillectomy    . Arm skin lesion biopsy / excision  2011    nodular faciitis lt arm  . Colonoscopy    . Breast lumpectomy with needle localization and axillary sentinel lymph node bx Right 05/07/2013    Procedure: NEEDLE LOCALIZATION RIGHT BREAST LUMPECTOMY AND   SENTINEL LYMPH NODE  ;  Surgeon: Currie Paris, MD;  Location: [facility];  Service: General;  Laterality: Right;    FAMILY HISTORY Family History  Problem Relation Age of Onset  . Breast cancer Mother 68  . Heart disease Father   . Bladder Cancer Father 54  . Breast cancer Maternal Aunt     diagnosed in her 68s  . Multiple sclerosis Paternal Aunt   . Heart disease Maternal Grandmother   . Mental retardation Cousin     maternal cousins; brothers, sons of aunt with breast cancer   the patient's  father immigrated from [place] 1938, living in [place] for about 10 years. He had significant coronary artery disease, bladder cancer, and a ruptured aneurysm, but lived to be 91. The patient's mother is living at age 96. The patient has one brother, no sisters. The patient's mother had breast cancer diagnosed at age 53. The patient is of a Ashkenazi Jewish descent. She will be meeting with our genetics counselor today  GYNECOLOGIC HISTORY: Menarche age 83, first live birth age 17, she is GX P2, menopause  2006, received hormone replacement until approximately 2010.  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 Her husband Anita Randolph has a management position for [employer]. This involves a lot of traveling. Son Anita Randolph lives in [place] and is a sports Banker. Daughter Anita Randolph lives in [place], where she is a Gaffer in international developments and is also getting an MPH in Development worker, international aid. The patient has no grandchildren.   ADVANCED DIRECTIVES: In place  HEALTH MAINTENANCE: History  Substance Use Topics  . Smoking status: Former Smoker -- 1.00 packs/day for 4 years    Types: Cigarettes    Quit date: 11/30/1974  . Smokeless tobacco: Never Used  . Alcohol Use: Yes     Comment: glass of wine on weekend   Fonnie exercises 3-4 times a week, doing weights, walking, and other structures exercises.   Colonoscopy: 2009/ [facility]  PAP: 2013  Bone density: 2010  Lipid panel:  No Known Allergies  Current Outpatient Prescriptions  Medication Sig Dispense Refill  . calcium-vitamin D (OSCAL WITH D) 500-200 MG-UNIT per tablet Take 1 tablet by mouth 2 (two) times daily. 600 vit d      . Diphenhyd-Hydrocort-Nystatin (FIRST-DUKES MOUTHWASH) SUSP 5-51ml QID prn - swish - swallow or spit  240 mL  1  . diphenoxylate-atropine (LOMOTIL) 2.5-0.025 MG per tablet Take 1 tablet by mouth every 4 (four) hours as needed for diarrhea or loose stools (up to 6 per day).  30 tablet  0  . oxyCODONE-acetaminophen (ROXICET) 5-325 MG per tablet Take 1 tablet by mouth every 4 (four) hours as needed for pain.  30 tablet  0  . simvastatin (ZOCOR) 40 MG tablet Take 40 mg by mouth daily.       No current facility-administered medications for this visit.    OBJECTIVE: Middle-aged white woman in no acute distress Filed Vitals:   05/23/13 1130  BP: 139/83  Pulse: 88  Temp: 98.3 F (36.8 C)  Resp: 20     Body mass index is 30.2 kg/(m^2).    ECOG FS:  0 Filed Weights   05/23/13 1130  Weight: 176 lb (79.833 kg)   Sclerae unicteric Oropharynx clear No cervical or supraclavicular adenopathy Lungs clear to auscultation bilaterally Heart regular rate and rhythm Abdomen soft, nontender, positive bowel sounds MSK no focal spinal tenderness No peripheral edema Neuro: nonfocal, well oriented with positive affect Breasts:  The right breast is status post recent lumpectomy. The incision is healing nicely. There is minimal ecchymosis and minimal in duration. There is no evidence of residual or recurrent disease. The right axilla is benign. The left breast is unremarkable. Marland Kitchen   Port is intact in the left upper chest wall     LAB RESULTS: CBC    Component Value Date/Time   WBC 7.2 05/23/2013 1056   RBC 3.53* 05/23/2013 1056   HGB 10.4* 05/23/2013 1056   HCT 32.5* 05/23/2013 1056   PLT 223 05/23/2013 1056  MCV 92.1 05/23/2013 1056   MCH 29.5 05/23/2013 1056   MCHC 32.0 05/23/2013 1056   RDW 14.9* 05/23/2013 1056   LYMPHSABS 1.8 05/23/2013 1056   MONOABS 0.5 05/23/2013 1056   EOSABS 0.2 05/23/2013 1056   BASOSABS 0.1 05/23/2013 1056       Chemistry      Component Value Date/Time   NA 144 05/02/2013 1043   K 3.7 05/02/2013 1043   CL 109* 05/02/2013 1043   CO2 27 05/02/2013 1043   BUN 7.2 05/02/2013 1043   BUN 15 08/23/2010 1350   CREATININE 0.9 05/02/2013 1043   CREATININE 1.00 08/23/2010 1350      Component Value Date/Time   CALCIUM 8.0* 05/02/2013 1043        STUDIES: Patient: MARIANY, MACKINTOSH Collected: 05/07/2013 Client: [facility] Accession: [id number] Received: 05/07/2013 Cyndia Bent DOB: July 13, 1951 Age: 27 Gender: F Reported: 05/09/2013 [address] Patient Ph: [phone] MRN #: [id number] [place], [place] [address] Visit #: [id number] Chart #: Phone: [phone] Fax: CC: Rogelia Mire, MD Marianne Sofia, RN Edwena Felty. Romine, MD REPORT OF SURGICAL PATHOLOGY FINAL DIAGNOSIS Diagnosis 1. Breast, lumpectomy, Right -  BREAST PARENCHYMA WITH CHANGES CONSISTENT WITH NEOADJUVANT TREATMENT. - CARCINOMA IS NOT IDENTIFIED. - SEE COMMENT. 2. Lymph node, sentinel, biopsy, Right axillary #1 - THERE IS NO EVIDENCE OF CARCINOMA IN 1 OF 1 LYMPH NODE (0/1). 3. Lymph node, sentinel, biopsy, Right axillary #2 - THERE IS NO EVIDENCE OF CARCINOMA IN 1 OF 1 LYMPH NODE (0/1). 4. Lymph node, sentinel, biopsy, Right axillary #3 - THERE IS NO EVIDENCE OF CARCINOMA IN 1 OF 1 LYMPH NODE (0/1). 5. Lymph node, sentinel, biopsy, Right axillary #4 - THERE IS NO EVIDENCE OF CARCINOMA IN 1 OF 1 LYMPH NODE (0/1). Microscopic Comment 1. Grossly, there is a 0.8 cm span of hemorrhagic and edematous breast parenchyma which on histologic evaluation reveals changes consistent with treatment. Carcinoma is not identified in this current case. Thus, this tumor is staged as a ypT0, ypN0. Of note, the treatment related changes are identified in breast parenchyma and not in the separately submitted lymph nodes ( specimens 2-5). (JBK:gt 05/09/13) Pecola Leisure MD Pathologist, Electronic Signature (Case signed 05/09/2013)  ASSESSMENT: 62 y.o. [place] woman   (1)  s/p right breast biopsy 11/15/2012 for a clinical T2 N0, stage IIA invasive ductal carcinoma, grade 3, with superficial involvement of the pectoralis muscle, estrogen receptor 29% positive, progesterone receptor negative, with an Mib-1 of 79% and HER-2 amplification by CISH with a ratio of 4.39  (2) treated in the neoadjuvant setting, completing 6 cycles of docetaxel/ carboplatin/ trastuzumab 04/11/2013  (3) trastuzumab will be continued for total of one year (through January 2015).-- Most recent echo 03/25/2013 showed a well-preserved ejecton fraction  (4) right lumpectomy and sentinel lymph node sampling 05/07/2013 showed a pathologic complete response  PLAN:  Alira tolerated her chemotherapy very well, with grade 1 peripheral neuropathy as her only significant remaining symptoms.  Hopefully this will drift away over the next several months. She does have some taste perversion still, and that we'll take months to recover as well. Her hair is only just starting to grow back. Nevertheless she has good energy, is working full-time, and is very positive.  She understands achieving a complete pathologic response significantly improves on her already good prognosis.  She is now ready to start radiation treatments. We are continuing trastuzumab through mid January 2015. She will have her next echocardiogram in July. She will see  me again in late August and before that visit she will have a bone density. That'll help [place] decide which antiestrogen to start  She knows to call for any problems that may develop before the next visit here  Ewa Hipp C    05/23/2013

## 2013-05-23 NOTE — Telephone Encounter (Signed)
Per staff phone call and POF I have schedueld appts.  JMW  

## 2013-05-28 ENCOUNTER — Ambulatory Visit
Admission: RE | Admit: 2013-05-28 | Discharge: 2013-05-28 | Disposition: A | Discharge status: Home / Self Care | Payer: 59 | Source: Ambulatory Visit | Attending: Radiation Oncology | Admitting: Radiation Oncology

## 2013-05-28 ENCOUNTER — Ambulatory Visit (INDEPENDENT_AMBULATORY_CARE_PROVIDER_SITE_OTHER): Payer: 59 | Admitting: Surgery

## 2013-05-28 ENCOUNTER — Encounter (INDEPENDENT_AMBULATORY_CARE_PROVIDER_SITE_OTHER): Payer: Self-pay | Admitting: Surgery

## 2013-05-28 VITALS — BP 140/82 | HR 72 | Temp 97.7°F | Resp 16 | Ht 64.0 in | Wt 176.6 lb

## 2013-05-28 DIAGNOSIS — Z79899 Other long term (current) drug therapy: Secondary | ICD-10-CM | POA: Insufficient documentation

## 2013-05-28 DIAGNOSIS — Z09 Encounter for follow-up examination after completed treatment for conditions other than malignant neoplasm: Secondary | ICD-10-CM

## 2013-05-28 DIAGNOSIS — C50919 Malignant neoplasm of unspecified site of unspecified female breast: Secondary | ICD-10-CM | POA: Insufficient documentation

## 2013-05-28 DIAGNOSIS — Y633 Inadvertent exposure of patient to radiation during medical care: Secondary | ICD-10-CM | POA: Insufficient documentation

## 2013-05-28 DIAGNOSIS — L589 Radiodermatitis, unspecified: Secondary | ICD-10-CM | POA: Insufficient documentation

## 2013-05-28 DIAGNOSIS — C50911 Malignant neoplasm of unspecified site of right female breast: Secondary | ICD-10-CM

## 2013-05-28 NOTE — Progress Notes (Signed)
Name: Anita Randolph   MRN: [id number]  Date:  05/28/2013  DOB: 02-27-1951  Status:outpatient    DIAGNOSIS: Breast cancer.  CONSENT VERIFIED: yes   SET UP: Patient is setup prone   IMMOBILIZATION:  The following immobilization was used: prone breast board   NARRATIVE: [name] was brought to the CT Simulation planning suite.  Identity was confirmed.  All relevant records and images related to the planned course of therapy were reviewed.  Then, the patient was positioned in a stable reproducible clinical set-up for radiation therapy on the prone breast board.    A wire was placed on the scar.  CT images were obtained.  An isocenter was placed. Skin markings were placed.  The CT images were loaded into the planning software where the target and avoidance structures were contoured.  The radiation prescription was entered and confirmed. The patient was discharged in stable condition and tolerated simulation well.    TREATMENT PLANNING NOTE:  Treatment planning then occurred. I have requested : MLC's, isodose plan, basic dose calculation  I personally supervised and approved the construction of 2 medically necessary complex treatment devices in the form of MLCs which will be used for beam modification purposes.

## 2013-05-28 NOTE — Progress Notes (Signed)
NAME: Anita Randolph                                            DOB: [birth year] DATE: 05/28/2013                                                  MRN: [id number]  CC:  Chief Complaint  Patient presents with  . Routine Post Op    HPI: This patient comes in for post op follow-up .Sheunderwent Right lumpectomy and SLN on 05/07/13. She feels that she is doing well.  PE:  VITAL SIGNS: BP 140/82  Pulse 72  Temp(Src) 97.7 F (36.5 C) (Temporal)  Resp 16  Ht 5\' 4"  (1.626 m)  Wt 176 lb 9.6 oz (80.105 kg)  BMI 30.3 kg/m2  General: The patient appears to be healthy, NAD Breast - she has an eccymotic breast, no obvious hematoma and no evidence of infection  DATA REVIEWED: 1. Breast, lumpectomy, Right - BREAST PARENCHYMA WITH CHANGES CONSISTENT WITH NEOADJUVANT TREATMENT. - CARCINOMA IS NOT IDENTIFIED. - SEE COMMENT. 2. Lymph node, sentinel, biopsy, Right axillary #1 - THERE IS NO EVIDENCE OF CARCINOMA IN 1 OF 1 LYMPH NODE (0/1). 3. Lymph node, sentinel, biopsy, Right axillary #2 - THERE IS NO EVIDENCE OF CARCINOMA IN 1 OF 1 LYMPH NODE (0/1). 4. Lymph node, sentinel, biopsy, Right axillary #3 - THERE IS NO EVIDENCE OF CARCINOMA IN 1 OF 1 LYMPH NODE (0/1). 5. Lymph node, sentinel, biopsy, Right axillary #4 - THERE IS NO EVIDENCE OF CARCINOMA IN 1 OF 1 LYMPH NODE (0/1). Microscopic Comment 1. Grossly, there is a 0.8 cm span of hemorrhagic and edematous breast parenchyma which on histologic evaluation reveals changes consistent with treatment. Carcinoma is not identified in this current case. Thus, this tumor is staged as a ypT0, ypN0. Of note, the treatment related changes are identified in breast parenchyma and not in the separately submitted lymph nodes ( specimens 2-5). (JBK:gt 05/09/13) Pecola Leisure MD Pathologist, Electronic Signature  IMPRESSION: The patient is doing well S/P Lumpectomy and SLN.    PLAN: See again in two months. Gave her a copy of her path

## 2013-05-28 NOTE — Patient Instructions (Signed)
See me again about 2 or 3 weeks afater you finish radiation

## 2013-06-05 ENCOUNTER — Ambulatory Visit
Admission: RE | Admit: 2013-06-05 | Discharge: 2013-06-05 | Disposition: A | Discharge status: Home / Self Care | Payer: 59 | Source: Ambulatory Visit | Attending: Radiation Oncology | Admitting: Radiation Oncology

## 2013-06-05 DIAGNOSIS — C50911 Malignant neoplasm of unspecified site of right female breast: Secondary | ICD-10-CM

## 2013-06-05 NOTE — Progress Notes (Signed)
  Radiation Oncology         [phone]) [phone] ________________________________  Name: Anita Randolph MRN: [id number]  Date: 06/05/2013  DOB: February 10, 1951  Simulation Verification Note  Status: outpatient  NARRATIVE: The patient was brought to the treatment unit and placed in the planned treatment position. The clinical setup was verified. Then port films were obtained and uploaded to the radiation oncology medical record software.  The treatment beams were carefully compared against the planned radiation fields. The position location and shape of the radiation fields was reviewed. The targeted volume of tissue appears appropriately covered by the radiation beams. Organs at risk appear to be excluded as planned.  Based on my personal review, I approved the simulation verification. The patient's treatment will proceed as planned.  ------------------------------------------------  Lurline Hare, MD

## 2013-06-06 ENCOUNTER — Ambulatory Visit
Admission: RE | Admit: 2013-06-06 | Discharge: 2013-06-06 | Disposition: A | Discharge status: Home / Self Care | Payer: 59 | Source: Ambulatory Visit | Attending: Radiation Oncology | Admitting: Radiation Oncology

## 2013-06-07 ENCOUNTER — Ambulatory Visit
Admission: RE | Admit: 2013-06-07 | Discharge: 2013-06-07 | Disposition: A | Discharge status: Home / Self Care | Payer: 59 | Source: Ambulatory Visit | Attending: Radiation Oncology | Admitting: Radiation Oncology

## 2013-06-10 ENCOUNTER — Ambulatory Visit
Admission: RE | Admit: 2013-06-10 | Discharge: 2013-06-10 | Disposition: A | Discharge status: Home / Self Care | Payer: 59 | Source: Ambulatory Visit | Attending: Radiation Oncology | Admitting: Radiation Oncology

## 2013-06-11 ENCOUNTER — Ambulatory Visit
Admission: RE | Admit: 2013-06-11 | Discharge: 2013-06-11 | Disposition: A | Discharge status: Home / Self Care | Payer: 59 | Source: Ambulatory Visit | Attending: Radiation Oncology | Admitting: Radiation Oncology

## 2013-06-11 ENCOUNTER — Encounter: Payer: Self-pay | Admitting: Radiation Oncology

## 2013-06-11 VITALS — BP 134/90 | HR 61 | Temp 98.3°F | Resp 20 | Wt 172.3 lb

## 2013-06-11 DIAGNOSIS — C50911 Malignant neoplasm of unspecified site of right female breast: Secondary | ICD-10-CM

## 2013-06-11 MED ORDER — RADIAPLEXRX EX GEL
Freq: Once | CUTANEOUS | Status: AC
  Administered 2013-06-11: 12:00:00 via TOPICAL

## 2013-06-11 MED ORDER — ALRA NON-METALLIC DEODORANT (RAD-ONC)
1.0000 "application " | Freq: Once | TOPICAL | Status: AC
  Administered 2013-06-11: 1 via TOPICAL

## 2013-06-11 NOTE — Progress Notes (Signed)
Weekly Management Note Current Dose: 10 Gy  Projected Dose: 30 Gy   Narrative:  The patient presents for routine under treatment assessment.  CBCT/MVCT images/Port film x-rays were reviewed.  The chart was checked. Doing well. Breast is feeling better. Working.   Physical Findings: Weight: 172 lb 4.8 oz (78.155 kg). Unchanged. No redness.  Impression:  The patient is tolerating radiation.  Plan:  Continue treatment as planned. Continue radiaplex.

## 2013-06-11 NOTE — Progress Notes (Signed)
Weekly radf txs, 4/33 completed right breast, still swollen and tender, pt education done, products given with instructions, radiation therapy and you book,my business card also, no c/o pain no skin changes 11:54 AM

## 2013-06-12 ENCOUNTER — Ambulatory Visit
Admission: RE | Admit: 2013-06-12 | Discharge: 2013-06-12 | Disposition: A | Discharge status: Home / Self Care | Payer: 59 | Source: Ambulatory Visit | Attending: Radiation Oncology | Admitting: Radiation Oncology

## 2013-06-13 ENCOUNTER — Ambulatory Visit
Admission: RE | Admit: 2013-06-13 | Discharge: 2013-06-13 | Disposition: A | Discharge status: Home / Self Care | Payer: 59 | Source: Ambulatory Visit | Attending: Radiation Oncology | Admitting: Radiation Oncology

## 2013-06-13 ENCOUNTER — Other Ambulatory Visit (HOSPITAL_BASED_OUTPATIENT_CLINIC_OR_DEPARTMENT_OTHER): Payer: 59 | Admitting: Lab

## 2013-06-13 ENCOUNTER — Ambulatory Visit (HOSPITAL_BASED_OUTPATIENT_CLINIC_OR_DEPARTMENT_OTHER): Payer: 59

## 2013-06-13 VITALS — BP 138/72 | HR 87 | Temp 98.2°F | Resp 20

## 2013-06-13 DIAGNOSIS — C50319 Malignant neoplasm of lower-inner quadrant of unspecified female breast: Secondary | ICD-10-CM

## 2013-06-13 DIAGNOSIS — C50911 Malignant neoplasm of unspecified site of right female breast: Secondary | ICD-10-CM

## 2013-06-13 DIAGNOSIS — Z5112 Encounter for antineoplastic immunotherapy: Secondary | ICD-10-CM

## 2013-06-13 LAB — CBC WITH DIFFERENTIAL/PLATELET
Basophils Absolute: 0 10*3/uL (ref 0.0–0.1)
Eosinophils Absolute: 0.2 10*3/uL (ref 0.0–0.5)
HCT: 34.9 % (ref 34.8–46.6)
LYMPH%: 33.7 % (ref 14.0–49.7)
MCV: 86.4 fL (ref 79.5–101.0)
MONO#: 0.4 10*3/uL (ref 0.1–0.9)
NEUT#: 2.9 10*3/uL (ref 1.5–6.5)
NEUT%: 55.8 % (ref 38.4–76.8)
Platelets: 241 10*3/uL (ref 145–400)
WBC: 5.2 10*3/uL (ref 3.9–10.3)
nRBC: 0 % (ref 0–0)

## 2013-06-13 LAB — COMPREHENSIVE METABOLIC PANEL (CC13)
ALT: 32 U/L (ref 0–55)
BUN: 19.6 mg/dL (ref 7.0–26.0)
CO2: 26 mEq/L (ref 22–29)
Calcium: 9.3 mg/dL (ref 8.4–10.4)
Chloride: 105 mEq/L (ref 98–109)
Creatinine: 1 mg/dL (ref 0.6–1.1)
Glucose: 93 mg/dl (ref 70–140)

## 2013-06-13 MED ORDER — HEPARIN SOD (PORK) LOCK FLUSH 100 UNIT/ML IV SOLN
500.0000 [IU] | Freq: Once | INTRAVENOUS | Status: AC | PRN
  Administered 2013-06-13: 500 [IU]
  Filled 2013-06-13: qty 5

## 2013-06-13 MED ORDER — SODIUM CHLORIDE 0.9 % IJ SOLN
10.0000 mL | INTRAMUSCULAR | Status: DC | PRN
  Administered 2013-06-13: 10 mL
  Filled 2013-06-13: qty 10

## 2013-06-13 MED ORDER — TRASTUZUMAB CHEMO INJECTION 440 MG
6.0000 mg/kg | Freq: Once | INTRAVENOUS | Status: AC
  Administered 2013-06-13: 462 mg via INTRAVENOUS
  Filled 2013-06-13: qty 22

## 2013-06-13 MED ORDER — ACETAMINOPHEN 325 MG PO TABS
650.0000 mg | ORAL_TABLET | Freq: Once | ORAL | Status: AC
  Administered 2013-06-13: 650 mg via ORAL

## 2013-06-13 MED ORDER — SODIUM CHLORIDE 0.9 % IV SOLN
Freq: Once | INTRAVENOUS | Status: AC
  Administered 2013-06-13: 16:00:00 via INTRAVENOUS

## 2013-06-13 NOTE — Patient Instructions (Addendum)
[facility] Discharge Instructions for Patients Receiving Chemotherapy  Today you received the following chemotherapy agents Herceptin.  To help prevent nausea and vomiting after your treatment, we encourage you to take your nausea medication as prescribed.   If you develop nausea and vomiting that is not controlled by your nausea medication, call the clinic.   BELOW ARE SYMPTOMS THAT SHOULD BE REPORTED IMMEDIATELY:  *FEVER GREATER THAN 100.5 F  *CHILLS WITH OR WITHOUT FEVER  NAUSEA AND VOMITING THAT IS NOT CONTROLLED WITH YOUR NAUSEA MEDICATION  *UNUSUAL SHORTNESS OF BREATH  *UNUSUAL BRUISING OR BLEEDING  TENDERNESS IN MOUTH AND THROAT WITH OR WITHOUT PRESENCE OF ULCERS  *URINARY PROBLEMS  *BOWEL PROBLEMS  UNUSUAL RASH Items with * indicate a potential emergency and should be followed up as soon as possible.  Feel free to call the clinic you have any questions or concerns. The clinic phone number is [phone].    

## 2013-06-14 ENCOUNTER — Ambulatory Visit
Admission: RE | Admit: 2013-06-14 | Discharge: 2013-06-14 | Disposition: A | Discharge status: Home / Self Care | Payer: 59 | Source: Ambulatory Visit | Attending: Radiation Oncology | Admitting: Radiation Oncology

## 2013-06-14 ENCOUNTER — Other Ambulatory Visit: Payer: 59 | Admitting: Lab

## 2013-06-17 ENCOUNTER — Ambulatory Visit
Admission: RE | Admit: 2013-06-17 | Discharge: 2013-06-17 | Disposition: A | Discharge status: Home / Self Care | Payer: 59 | Source: Ambulatory Visit | Attending: Radiation Oncology | Admitting: Radiation Oncology

## 2013-06-18 ENCOUNTER — Ambulatory Visit
Admission: RE | Admit: 2013-06-18 | Discharge: 2013-06-18 | Disposition: A | Discharge status: Home / Self Care | Payer: 59 | Source: Ambulatory Visit | Attending: Radiation Oncology | Admitting: Radiation Oncology

## 2013-06-18 VITALS — BP 136/89 | HR 83 | Temp 98.5°F | Ht 64.0 in | Wt 173.7 lb

## 2013-06-18 DIAGNOSIS — C50911 Malignant neoplasm of unspecified site of right female breast: Secondary | ICD-10-CM

## 2013-06-18 NOTE — Progress Notes (Signed)
Anita Randolph here for weekly under treat visit.  She has had 9 fractions to her right breast.  She denies pain and fatigue.  Her right breast skin is intact with some hyperpigmentation underneath her breast.  She is using radiaplex gel 3 times a day.

## 2013-06-18 NOTE — Progress Notes (Signed)
Weekly Management Note Current Dose: 16.2  Gy  Projected Dose: 60.4 Gy   Narrative:  The patient presents for routine under treatment assessment.  CBCT/MVCT images/Port film x-rays were reviewed.  The chart was checked. Doing well. No complaints. Questions about radiation dose and "cumulative radiation dose" Using radiaplex.  Physical Findings: Weight: 173 lb 11.2 oz (78.79 kg). Unchanged  Impression:  The patient is tolerating radiation.  Plan:  Continue treatment as planned. Discussed basics of radiobiology and radiation safety issues.

## 2013-06-19 ENCOUNTER — Ambulatory Visit
Admission: RE | Admit: 2013-06-19 | Discharge: 2013-06-19 | Disposition: A | Discharge status: Home / Self Care | Payer: 59 | Source: Ambulatory Visit | Attending: Radiation Oncology | Admitting: Radiation Oncology

## 2013-06-20 ENCOUNTER — Ambulatory Visit
Admission: RE | Admit: 2013-06-20 | Discharge: 2013-06-20 | Disposition: A | Discharge status: Home / Self Care | Payer: 59 | Source: Ambulatory Visit | Attending: Radiation Oncology | Admitting: Radiation Oncology

## 2013-06-20 ENCOUNTER — Telehealth: Payer: Self-pay | Admitting: *Deleted

## 2013-06-20 MED ORDER — CEPHALEXIN 500 MG PO CAPS: 500.0000 mg | ORAL_CAPSULE | Freq: Three times a day (TID) | ORAL | Status: DC

## 2013-06-20 NOTE — Telephone Encounter (Signed)
This RN spoke with pt per concern of " I am concerned that my nail beds are getting infected ".  Per discussion pt states noted " flaking and thinness of nails " which she has managed with good grooming, " but now the nail beds at the bottom on a couple of my fingers are lifting up and I can get pus out from them ", fingers are also tender.  Pt used straight hydrogen peroxide last week with benefit.  This RN discussed proper use of peroxide for skin infections with advice to do 1/2 water and 1/2 peroxide.  Above also reviewed with AB/PA with recommendation for antibiotic therapy and listerine soaks /  Prescription escribed and above recommendations discussed wth patient.

## 2013-06-21 ENCOUNTER — Ambulatory Visit
Admission: RE | Admit: 2013-06-21 | Discharge: 2013-06-21 | Disposition: A | Discharge status: Home / Self Care | Payer: 59 | Source: Ambulatory Visit | Attending: Radiation Oncology | Admitting: Radiation Oncology

## 2013-06-24 ENCOUNTER — Ambulatory Visit
Admission: RE | Admit: 2013-06-24 | Discharge: 2013-06-24 | Disposition: A | Discharge status: Home / Self Care | Payer: 59 | Source: Ambulatory Visit | Attending: Radiation Oncology | Admitting: Radiation Oncology

## 2013-06-25 ENCOUNTER — Ambulatory Visit
Admission: RE | Admit: 2013-06-25 | Discharge: 2013-06-25 | Disposition: A | Discharge status: Home / Self Care | Payer: 59 | Source: Ambulatory Visit | Attending: Radiation Oncology | Admitting: Radiation Oncology

## 2013-06-25 ENCOUNTER — Ambulatory Visit (HOSPITAL_BASED_OUTPATIENT_CLINIC_OR_DEPARTMENT_OTHER)
Admission: RE | Admit: 2013-06-25 | Discharge: 2013-06-25 | Disposition: A | Discharge status: Home / Self Care | Payer: 59 | Source: Ambulatory Visit | Attending: Internal Medicine | Admitting: Internal Medicine

## 2013-06-25 ENCOUNTER — Encounter (HOSPITAL_COMMUNITY): Payer: Self-pay

## 2013-06-25 ENCOUNTER — Ambulatory Visit (HOSPITAL_COMMUNITY)
Admission: RE | Admit: 2013-06-25 | Discharge: 2013-06-25 | Disposition: A | Discharge status: Home / Self Care | Payer: 59 | Source: Ambulatory Visit | Attending: Internal Medicine | Admitting: Internal Medicine

## 2013-06-25 ENCOUNTER — Encounter: Payer: Self-pay | Admitting: Radiation Oncology

## 2013-06-25 VITALS — BP 124/80 | HR 80 | Wt 173.8 lb

## 2013-06-25 VITALS — BP 128/83 | HR 98 | Temp 98.1°F | Resp 20 | Wt 175.9 lb

## 2013-06-25 DIAGNOSIS — R600 Localized edema: Secondary | ICD-10-CM

## 2013-06-25 DIAGNOSIS — K219 Gastro-esophageal reflux disease without esophagitis: Secondary | ICD-10-CM | POA: Insufficient documentation

## 2013-06-25 DIAGNOSIS — Z09 Encounter for follow-up examination after completed treatment for conditions other than malignant neoplasm: Secondary | ICD-10-CM

## 2013-06-25 DIAGNOSIS — C50911 Malignant neoplasm of unspecified site of right female breast: Secondary | ICD-10-CM

## 2013-06-25 DIAGNOSIS — C50919 Malignant neoplasm of unspecified site of unspecified female breast: Secondary | ICD-10-CM

## 2013-06-25 DIAGNOSIS — R609 Edema, unspecified: Secondary | ICD-10-CM

## 2013-06-25 DIAGNOSIS — Z87891 Personal history of nicotine dependence: Secondary | ICD-10-CM | POA: Insufficient documentation

## 2013-06-25 NOTE — Progress Notes (Signed)
[facility]    Radiation Oncology [address] [place]     Maryln Gottron, M.D. [place], [place] [address]               Billie Lade, M.D., Ph.D. Phone: [phone]      Molli Hazard A. Kathrynn Running, M.D. Fax: [phone]      Radene Gunning, M.D., Ph.D.         Lurline Hare, M.D.         Grayland Jack, M.D Weekly Treatment Management Note  Name: Anita Randolph     MRN: [id number]        CSN: [id number] Date: 06/25/2013      DOB: 1951/11/28  CC: Anita Reeve, MD         Anita Randolph    Status: Outpatient  Diagnosis: The encounter diagnosis was Breast cancer, right breast.  Current Dose: 25.2 Gy  Current Fraction: 14  Planned Dose: 50.4 Gy  Narrative: Anita Randolph was seen today for weekly treatment management. The chart was checked and port films  were reviewed. She continues to tolerate her treatments well without any significant itching or discomfort along the breast area or fatigue.  She continues to work full-time  Review of patient's allergies indicates no known allergies.  Current Outpatient Prescriptions  Medication Sig Dispense Refill  . calcium-vitamin D (OSCAL WITH D) 500-200 MG-UNIT per tablet Take 1 tablet by mouth 2 (two) times daily. 600 vit d      . cephALEXin (KEFLEX) 500 MG capsule Take 1 capsule (500 mg total) by mouth 3 (three) times daily.  21 capsule  0  . diphenoxylate-atropine (LOMOTIL) 2.5-0.025 MG per tablet Take 1 tablet by mouth every 4 (four) hours as needed for diarrhea or loose stools (up to 6 per day).  30 tablet  0  . hyaluronate sodium (RADIAPLEXRX) GEL Apply 1 application topically 2 (two) times daily. Apply after rad tx and bedtime&prn      . non-metallic deodorant (ALRA) MISC Apply 1 application topically daily as needed. Apply after rad tx      . simvastatin (ZOCOR) 40 MG tablet Take 40 mg by mouth daily.       No current facility-administered medications for this encounter.   Labs:  Lab Results  Component Value Date   WBC 5.2 06/13/2013   HGB 11.6 06/13/2013   HCT 34.9 06/13/2013   MCV 86.4 06/13/2013   PLT 241 06/13/2013   Lab Results  Component Value Date   CREATININE 1.0 06/13/2013   BUN 19.6 06/13/2013   NA 139 06/13/2013   K 3.7 06/13/2013   CL 109* 05/02/2013   CO2 26 06/13/2013   Lab Results  Component Value Date   ALT 32 06/13/2013   AST 24 06/13/2013   BILITOT 0.34 06/13/2013    Physical Examination:  weight is 175 lb 14.4 oz (79.788 kg). Her oral temperature is 98.1 F (36.7 C). Her blood pressure is 128/83 and her pulse is 98. Her respiration is 20.    Wt Readings from Last 3 Encounters:  06/25/13 175 lb 14.4 oz (79.788 kg)  06/25/13 173 lb 12.8 oz (78.835 kg)  06/18/13 173 lb 11.2 oz (78.79 kg)    The right breast area shows some hyperpigmentation changes and mild erythema. The skin is intact Lungs - Normal respiratory effort, chest expands symmetrically. Lungs are clear to auscultation, no crackles or wheezes.  Heart has regular rhythm and rate  Abdomen is soft and non tender  with normal bowel sounds  Assessment:  Patient tolerating treatments well  Plan: Continue treatment per original radiation prescription

## 2013-06-25 NOTE — Progress Notes (Signed)
  Echocardiogram 2D Echocardiogram has been performed.  Anita Randolph 06/25/2013, 8:55 AM

## 2013-06-25 NOTE — Progress Notes (Signed)
Weekly rad txs Rt breast 14/33 completed, erythmea, skin intact, no c/o pain,nausea, using radiaplex bid, saw Heart Dr. Today had echocardiogram today, next Herceptin scheduled 8/7/4 4:16 PM

## 2013-06-25 NOTE — Patient Instructions (Addendum)
The current medical regimen is effective;  continue present plan and medications.  Your physician has requested that you have an echocardiogram in 3 months. Echocardiography is a painless test that uses sound waves to create images of your heart. It provides your doctor with information about the size and shape of your heart and how well your heart's chambers and valves are working. This procedure takes approximately one hour. There are no restrictions for this procedure.  Follow up with Dr Gala Romney in 3 months.

## 2013-06-25 NOTE — Progress Notes (Signed)
Patient ID: Anita Randolph, female   DOB: Jul 03, 1951, 62 y.o.   MRN: [id number]  PCP: Mila Palmer  GYN: Meredeth Ide  General Surgeon: Cicero Duck    HPI: Anita Randolph is a 62 year with R breast cancer diagnosed in 10/2012-invasive ductal carcinoma, grade 3, 29% estrogen receptor positive, progesterone receptor negative, hyperlipidemia and no known heart disease referred to the cardio-onc clinic by Dr Darnelle Catalan  She finished neoadjuvant docetaxel/carboplatin/trastuzumab  6/6 cycles on Apr 11, 2013. Underwent R lumpectomy and lymph node resection (all clear) on 05/07/13. Now receiving XRT. Doing fine. Denies SOB/PND/Orthopnea. Complains of fatigue. Had some mild edema after surgery but it resolved.   She currently works full time at [employer] for [employer].   ECHO 12/13/12 EF 60-65% Lateral s' 7.9 ECHO 03/25/13 EF 55-60% Lateral s' 9.5 ECHO 06/16/13 EF 60% Lateral s' 7.9 (poor window) overall strain -17.7  Walking 2.5 miles almost every day  PHYSICAL EXAM: Filed Vitals:   06/25/13 0849  BP: 124/80  Pulse: 80   General:  Well appearing. No respiratory difficulty HEENT: normal Neck: supple. no JVD. Carotids 2+ bilat; no bruits. No lymphadenopathy or thryomegaly appreciated. Cor: PMI nondisplaced. Regular rate & rhythm. No rubs, gallops or murmurs. Lungs: clear Abdomen: soft, nontender, nondistended. No hepatosplenomegaly. No bruits or masses. Good bowel sounds. Extremities: no cyanosis, clubbing, rash, edema Neuro: alert & oriented x 3, cranial nerves grossly intact. moves all 4 extremities w/o difficulty. Affect pleasant.   ASSESSMENT/PLAN: 1. Breast CA - I reviewed echos personally. EF and Doppler parameters stable. No HF on exam. Continue Herceptin. Repeat echo in 3 months. She will finish Herceptin in 12/14.  2. Extremity edema - this has resolved. If recurs and persists we can start on prn lasix.

## 2013-06-26 ENCOUNTER — Ambulatory Visit
Admission: RE | Admit: 2013-06-26 | Discharge: 2013-06-26 | Disposition: A | Discharge status: Home / Self Care | Payer: 59 | Source: Ambulatory Visit | Attending: Radiation Oncology | Admitting: Radiation Oncology

## 2013-06-27 ENCOUNTER — Ambulatory Visit
Admission: RE | Admit: 2013-06-27 | Discharge: 2013-06-27 | Disposition: A | Discharge status: Home / Self Care | Payer: 59 | Source: Ambulatory Visit | Attending: Radiation Oncology | Admitting: Radiation Oncology

## 2013-06-27 DIAGNOSIS — C50911 Malignant neoplasm of unspecified site of right female breast: Secondary | ICD-10-CM

## 2013-06-27 MED ORDER — RADIAPLEXRX EX GEL
Freq: Once | CUTANEOUS | Status: AC
  Administered 2013-06-27: 16:00:00 via TOPICAL

## 2013-06-27 NOTE — Progress Notes (Signed)
Received a call from [facility] on linac 3 stating that Anita Randolph is requesting a refill on her radiaplex.  Another tube was given to her.

## 2013-06-28 ENCOUNTER — Ambulatory Visit
Admission: RE | Admit: 2013-06-28 | Discharge: 2013-06-28 | Disposition: A | Discharge status: Home / Self Care | Payer: 59 | Source: Ambulatory Visit | Attending: Radiation Oncology | Admitting: Radiation Oncology

## 2013-07-01 ENCOUNTER — Ambulatory Visit
Admission: RE | Admit: 2013-07-01 | Discharge: 2013-07-01 | Disposition: A | Discharge status: Home / Self Care | Payer: 59 | Source: Ambulatory Visit | Attending: Radiation Oncology | Admitting: Radiation Oncology

## 2013-07-02 ENCOUNTER — Ambulatory Visit
Admission: RE | Admit: 2013-07-02 | Discharge: 2013-07-02 | Disposition: A | Discharge status: Home / Self Care | Payer: 59 | Source: Ambulatory Visit | Attending: Radiation Oncology | Admitting: Radiation Oncology

## 2013-07-02 ENCOUNTER — Encounter: Payer: Self-pay | Admitting: Radiation Oncology

## 2013-07-02 VITALS — BP 134/83 | HR 92 | Temp 98.0°F | Resp 20 | Wt 175.4 lb

## 2013-07-02 DIAGNOSIS — C50911 Malignant neoplasm of unspecified site of right female breast: Secondary | ICD-10-CM

## 2013-07-02 MED ORDER — BIAFINE EX EMUL
CUTANEOUS | Status: DC | PRN
  Administered 2013-07-02: 16:00:00 via TOPICAL

## 2013-07-02 NOTE — Progress Notes (Signed)
Weekly rad txs, rt breast, 19/33 txs,  Bright erythema, dermatitis, no skin breakdown, c/o itching, uses radiaplex and cortisone, changed to biafine tor try , asppetite good, no fatigue, no nausea 4:19 PM

## 2013-07-02 NOTE — Progress Notes (Signed)
Weekly Management Note Current Dose: 34.2  Gy  Projected Dose: 50.4 Gy   Narrative:  The patient presents for routine under treatment assessment.  CBCT/MVCT images/Port film x-rays were reviewed.  The chart was checked. Doing well. More irritation medially with some itching.   Physical Findings: Weight: 175 lb 6.4 oz (79.561 kg). Dermatitis medially. To left breast.  Impression:  The patient is tolerating radiation.  Plan:  Continue treatment as planned. Switch to biafene. Add back hydrocortisone if necessary.

## 2013-07-03 ENCOUNTER — Ambulatory Visit
Admission: RE | Admit: 2013-07-03 | Discharge: 2013-07-03 | Disposition: A | Discharge status: Home / Self Care | Payer: 59 | Source: Ambulatory Visit | Attending: Radiation Oncology | Admitting: Radiation Oncology

## 2013-07-04 ENCOUNTER — Ambulatory Visit
Admission: RE | Admit: 2013-07-04 | Discharge: 2013-07-04 | Disposition: A | Discharge status: Home / Self Care | Payer: 59 | Source: Ambulatory Visit | Attending: Radiation Oncology | Admitting: Radiation Oncology

## 2013-07-04 ENCOUNTER — Ambulatory Visit (HOSPITAL_BASED_OUTPATIENT_CLINIC_OR_DEPARTMENT_OTHER): Payer: 59

## 2013-07-04 ENCOUNTER — Other Ambulatory Visit (HOSPITAL_BASED_OUTPATIENT_CLINIC_OR_DEPARTMENT_OTHER): Payer: 59 | Admitting: Lab

## 2013-07-04 VITALS — BP 130/78 | HR 65 | Temp 97.8°F | Resp 20

## 2013-07-04 DIAGNOSIS — C50911 Malignant neoplasm of unspecified site of right female breast: Secondary | ICD-10-CM

## 2013-07-04 DIAGNOSIS — Z5112 Encounter for antineoplastic immunotherapy: Secondary | ICD-10-CM

## 2013-07-04 DIAGNOSIS — C50319 Malignant neoplasm of lower-inner quadrant of unspecified female breast: Secondary | ICD-10-CM

## 2013-07-04 LAB — CBC WITH DIFFERENTIAL/PLATELET
BASO%: 0.6 % (ref 0.0–2.0)
EOS%: 3.1 % (ref 0.0–7.0)
HCT: 38.9 % (ref 34.8–46.6)
MCH: 27.8 pg (ref 25.1–34.0)
MCHC: 33.2 g/dL (ref 31.5–36.0)
MONO#: 0.4 10*3/uL (ref 0.1–0.9)
NEUT%: 61.2 % (ref 38.4–76.8)
RBC: 4.64 10*6/uL (ref 3.70–5.45)
RDW: 13.8 % (ref 11.2–14.5)
WBC: 4.8 10*3/uL (ref 3.9–10.3)
lymph#: 1.3 10*3/uL (ref 0.9–3.3)
nRBC: 0 % (ref 0–0)

## 2013-07-04 LAB — COMPREHENSIVE METABOLIC PANEL (CC13)
ALT: 23 U/L (ref 0–55)
AST: 24 U/L (ref 5–34)
Albumin: 3.8 g/dL (ref 3.5–5.0)
Alkaline Phosphatase: 78 U/L (ref 40–150)
Calcium: 9.7 mg/dL (ref 8.4–10.4)
Chloride: 106 mEq/L (ref 98–109)
Potassium: 4.2 mEq/L (ref 3.5–5.1)
Sodium: 140 mEq/L (ref 136–145)
Total Protein: 7 g/dL (ref 6.4–8.3)

## 2013-07-04 MED ORDER — SODIUM CHLORIDE 0.9 % IJ SOLN
10.0000 mL | INTRAMUSCULAR | Status: DC | PRN
  Administered 2013-07-04: 10 mL
  Filled 2013-07-04: qty 10

## 2013-07-04 MED ORDER — HEPARIN SOD (PORK) LOCK FLUSH 100 UNIT/ML IV SOLN
500.0000 [IU] | Freq: Once | INTRAVENOUS | Status: AC | PRN
  Administered 2013-07-04: 500 [IU]
  Filled 2013-07-04: qty 5

## 2013-07-04 MED ORDER — SODIUM CHLORIDE 0.9 % IV SOLN
Freq: Once | INTRAVENOUS | Status: AC
  Administered 2013-07-04: 10:00:00 via INTRAVENOUS

## 2013-07-04 MED ORDER — TRASTUZUMAB CHEMO INJECTION 440 MG
6.0000 mg/kg | Freq: Once | INTRAVENOUS | Status: AC
  Administered 2013-07-04: 462 mg via INTRAVENOUS
  Filled 2013-07-04: qty 22

## 2013-07-04 MED ORDER — ACETAMINOPHEN 325 MG PO TABS
650.0000 mg | ORAL_TABLET | Freq: Once | ORAL | Status: AC
  Administered 2013-07-04: 650 mg via ORAL

## 2013-07-04 NOTE — Patient Instructions (Addendum)
[facility] Discharge Instructions for Patients Receiving Chemotherapy  Today you received the following chemotherapy agents Herceptin.  To help prevent nausea and vomiting after your treatment, we encourage you to take your nausea medication as prescribed.   If you develop nausea and vomiting that is not controlled by your nausea medication, call the clinic.   BELOW ARE SYMPTOMS THAT SHOULD BE REPORTED IMMEDIATELY:  *FEVER GREATER THAN 100.5 F  *CHILLS WITH OR WITHOUT FEVER  NAUSEA AND VOMITING THAT IS NOT CONTROLLED WITH YOUR NAUSEA MEDICATION  *UNUSUAL SHORTNESS OF BREATH  *UNUSUAL BRUISING OR BLEEDING  TENDERNESS IN MOUTH AND THROAT WITH OR WITHOUT PRESENCE OF ULCERS  *URINARY PROBLEMS  *BOWEL PROBLEMS  UNUSUAL RASH Items with * indicate a potential emergency and should be followed up as soon as possible.  Feel free to call the clinic you have any questions or concerns. The clinic phone number is [phone].    

## 2013-07-04 NOTE — Progress Notes (Signed)
Name: Anita Randolph   MRN: [id number]  Date:  07/04/2013   DOB: Oct 10, 1951  Status:outpatient    DIAGNOSIS: Breast cancer.  CONSENT VERIFIED: yes   SET UP: Patient is setup supine   IMMOBILIZATION:  The following immobilization was used:Custom Moldable Pillow, breast board.   NARRATIVE: Anita Randolph underwent complex simulation and treatment planning for her boost treatment today.  She was brought to the CT simulator and placed in the supine position. A custom pillow was made for immobilation purposes. A CT scan was taken and an isocenter was placed near her tumor cavity in the medial aspect of the breast. She tolerated simulation well.   A total of 2 complex treatment devices were created. I personally oversaw and approved their creation.  A special port plan is requested.

## 2013-07-05 ENCOUNTER — Ambulatory Visit
Admission: RE | Admit: 2013-07-05 | Discharge: 2013-07-05 | Disposition: A | Discharge status: Home / Self Care | Payer: 59 | Source: Ambulatory Visit | Attending: Radiation Oncology | Admitting: Radiation Oncology

## 2013-07-05 ENCOUNTER — Other Ambulatory Visit: Payer: 59 | Admitting: Lab

## 2013-07-08 ENCOUNTER — Ambulatory Visit
Admission: RE | Admit: 2013-07-08 | Discharge: 2013-07-08 | Disposition: A | Discharge status: Home / Self Care | Payer: 59 | Source: Ambulatory Visit | Attending: Radiation Oncology | Admitting: Radiation Oncology

## 2013-07-09 ENCOUNTER — Encounter: Payer: Self-pay | Admitting: Radiation Oncology

## 2013-07-09 ENCOUNTER — Ambulatory Visit
Admission: RE | Admit: 2013-07-09 | Discharge: 2013-07-09 | Disposition: A | Discharge status: Home / Self Care | Payer: 59 | Source: Ambulatory Visit | Attending: Radiation Oncology | Admitting: Radiation Oncology

## 2013-07-09 VITALS — BP 127/81 | HR 89 | Temp 97.8°F | Resp 20 | Wt 172.9 lb

## 2013-07-09 DIAGNOSIS — C50911 Malignant neoplasm of unspecified site of right female breast: Secondary | ICD-10-CM

## 2013-07-09 NOTE — Progress Notes (Signed)
Name: Anita Randolph   MRN: [id number]  Date:  07/09/2013   DOB: 1951-01-20  Status:outpatient    DIAGNOSIS: Breast cancer.  CONSENT VERIFIED: yes   SET UP: Patient is setup supine   IMMOBILIZATION:  The following immobilization was used:Custom Moldable Pillow, breast board.   NARRATIVE: [name] underwent complex simulation and treatment planning for her boost treatment today.  Her tumor volume was outlined on the planning CT scan. The depth of her cavity was 5.7 Cm.    18  MeV electrons will be prescribed to the 90% Isodose line.   A block will be used for beam modification purposes.  A special port plan is requested.

## 2013-07-09 NOTE — Progress Notes (Signed)
Weekly Management Note Current Dose:  43.2 Gy  Projected Dose: 60.4 Gy   Narrative:  The patient presents for routine under treatment assessment.  CBCT/MVCT images/Port film x-rays were reviewed.  The chart was checked. Doing well. Biafene working much better than radiaplex. Feeling good. Working.   Physical Findings: Weight: 172 lb 14.4 oz (78.427 kg). Dry desquamation over right breast. Inframammary folds clean and intact.   Impression:  The patient is tolerating radiation.  Plan:  Continue treatment as planned. Continue biafene. Discussed boost treatment.

## 2013-07-09 NOTE — Progress Notes (Signed)
Weekly rad breast  Rt , 24/33, erythema dermatitis but biafine cream is helping a lot stated patient,  Appetite good, no pain , itching not as bad

## 2013-07-10 ENCOUNTER — Ambulatory Visit
Admission: RE | Admit: 2013-07-10 | Discharge: 2013-07-10 | Disposition: A | Discharge status: Home / Self Care | Payer: 59 | Source: Ambulatory Visit | Attending: Radiation Oncology | Admitting: Radiation Oncology

## 2013-07-11 ENCOUNTER — Encounter: Payer: Self-pay | Admitting: Physician Assistant

## 2013-07-11 ENCOUNTER — Ambulatory Visit
Admission: RE | Admit: 2013-07-11 | Discharge: 2013-07-11 | Disposition: A | Discharge status: Home / Self Care | Payer: 59 | Source: Ambulatory Visit | Attending: Radiation Oncology | Admitting: Radiation Oncology

## 2013-07-12 ENCOUNTER — Ambulatory Visit
Admission: RE | Admit: 2013-07-12 | Discharge: 2013-07-12 | Disposition: A | Discharge status: Home / Self Care | Payer: 59 | Source: Ambulatory Visit | Attending: Radiation Oncology | Admitting: Radiation Oncology

## 2013-07-12 DIAGNOSIS — C50911 Malignant neoplasm of unspecified site of right female breast: Secondary | ICD-10-CM

## 2013-07-12 MED ORDER — BIAFINE EX EMUL
CUTANEOUS | Status: DC | PRN
  Administered 2013-07-12: 16:00:00 via TOPICAL

## 2013-07-12 MED ORDER — BIAFINE EX EMUL: CUTANEOUS | Status: DC | PRN

## 2013-07-12 NOTE — Progress Notes (Signed)
2nd tube biafin cream given, pt almost out, still has rashy dermatitis 4:11 PM

## 2013-07-15 ENCOUNTER — Ambulatory Visit
Admission: RE | Admit: 2013-07-15 | Discharge: 2013-07-15 | Disposition: A | Discharge status: Home / Self Care | Payer: 59 | Source: Ambulatory Visit | Attending: Radiation Oncology | Admitting: Radiation Oncology

## 2013-07-16 ENCOUNTER — Ambulatory Visit
Admission: RE | Admit: 2013-07-16 | Discharge: 2013-07-16 | Disposition: A | Discharge status: Home / Self Care | Payer: 59 | Source: Ambulatory Visit | Attending: Radiation Oncology | Admitting: Radiation Oncology

## 2013-07-16 ENCOUNTER — Encounter: Payer: Self-pay | Admitting: Radiation Oncology

## 2013-07-16 VITALS — BP 127/87 | HR 84 | Temp 97.7°F | Resp 20 | Wt 173.5 lb

## 2013-07-16 DIAGNOSIS — C50911 Malignant neoplasm of unspecified site of right female breast: Secondary | ICD-10-CM

## 2013-07-16 NOTE — Progress Notes (Signed)
Weekly Management Note Current Dose: 52.4 Gy  Projected Dose: 60.4 Gy   Narrative:  The patient presents for routine under treatment assessment.  CBCT/MVCT images/Port film x-rays were reviewed.  The chart was checked. Skin changes developed late last week. Pain under breast.   Physical Findings: Weight: 173 lb 8.6 oz (78.717 kg).Dry desquamation over breast and moist desquamation over inframammary fold.  Impression:  The patient is tolerating radiation.  Plan:  Continue treatment as planned. Neosporin to inframammary fold. Biafene to rest of breast. Recheck on Friday.

## 2013-07-16 NOTE — Progress Notes (Signed)
Weekly rad txs right breast, peeling on sternum of chest, under inframmary fold, dry desquamation, erythema elsewhere on breast, using biafine cream bid, started peeling on Sunday stated patient, ,,  suggested to get neosporin with pain relief on open areas, "feels like  A bruise inframmary fold, and burny", on boost today  4:16 PM

## 2013-07-17 ENCOUNTER — Ambulatory Visit
Admission: RE | Admit: 2013-07-17 | Discharge: 2013-07-17 | Disposition: A | Discharge status: Home / Self Care | Payer: 59 | Source: Ambulatory Visit | Attending: Radiation Oncology | Admitting: Radiation Oncology

## 2013-07-17 ENCOUNTER — Other Ambulatory Visit: Payer: 59

## 2013-07-17 ENCOUNTER — Ambulatory Visit
Admission: RE | Admit: 2013-07-17 | Discharge: 2013-07-17 | Disposition: A | Discharge status: Home / Self Care | Payer: 59 | Source: Ambulatory Visit | Attending: Oncology | Admitting: Oncology

## 2013-07-17 DIAGNOSIS — C50911 Malignant neoplasm of unspecified site of right female breast: Secondary | ICD-10-CM

## 2013-07-18 ENCOUNTER — Ambulatory Visit
Admission: RE | Admit: 2013-07-18 | Discharge: 2013-07-18 | Disposition: A | Discharge status: Home / Self Care | Payer: 59 | Source: Ambulatory Visit | Attending: Radiation Oncology | Admitting: Radiation Oncology

## 2013-07-18 DIAGNOSIS — C50911 Malignant neoplasm of unspecified site of right female breast: Secondary | ICD-10-CM

## 2013-07-19 ENCOUNTER — Ambulatory Visit
Admission: RE | Admit: 2013-07-19 | Discharge: 2013-07-19 | Disposition: A | Discharge status: Home / Self Care | Payer: 59 | Source: Ambulatory Visit | Attending: Radiation Oncology | Admitting: Radiation Oncology

## 2013-07-19 ENCOUNTER — Encounter: Payer: Self-pay | Admitting: Radiation Oncology

## 2013-07-19 VITALS — BP 107/74 | HR 85 | Temp 97.7°F | Resp 20 | Wt 174.1 lb

## 2013-07-19 DIAGNOSIS — C50911 Malignant neoplasm of unspecified site of right female breast: Secondary | ICD-10-CM

## 2013-07-19 NOTE — Progress Notes (Signed)
Rad txs rt breast 32/33 completed, using neosporin with pain relief under inframmary fold  moist desquamation there, dry desquamation on breast , rash on front of chest, biafine cream rest of breast pain , takes tylenol po prn which helps pain 3=4 on 1-10 scale, "It's just irritationg, tylenol nips it in the bud"stated, 1 mor tx , f/u appt, vitals wnl,

## 2013-07-19 NOTE — Progress Notes (Signed)
Weekly Management Note Current Dose: 58.4  Gy  Projected Dose: 60.4 Gy   Narrative:  The patient presents for routine under treatment assessment.  CBCT/MVCT images/Port film x-rays were reviewed.  The chart was checked. Still using neosporin on inframammary fold. Sore but tolerable. Excited to be done. Appt with [facility] next week.   Physical Findings: Weight: 174 lb 1.6 oz (78.971 kg). Inframammary fold is better. Small area of moist desquamation continues. Rest of dry desquamation is healing well.  Impression:  The patient is tolerating radiation.  Plan:  Continue treatment as planned. Continue neosporin until healed. Biafene x 2 weeks then lotion with vit e.

## 2013-07-22 ENCOUNTER — Ambulatory Visit
Admission: RE | Admit: 2013-07-22 | Discharge: 2013-07-22 | Disposition: A | Discharge status: Home / Self Care | Payer: 59 | Source: Ambulatory Visit | Attending: Radiation Oncology | Admitting: Radiation Oncology

## 2013-07-22 ENCOUNTER — Encounter: Payer: Self-pay | Admitting: Radiation Oncology

## 2013-07-24 NOTE — Progress Notes (Addendum)
  Radiation Oncology         [phone] ________________________________  Name: Anita Randolph MRN: [id number]  Date: 07/22/2013  DOB: September 19, 1951  End of Treatment Note  Diagnosis:   T2N0 right breast cancer     Indication for treatment:  Curative       Radiation treatment dates:  06/06/2013-07/22/2013  Site/dose:    Prone Right breast / 50.4 Gray @ 1.8 Wallace Cullens per fraction x 28 fractions Right breast boost / 10 Gray at [employer] per fraction x 5 fractions  Beams/energy:  Opposed Tangents / 6 and photons En face / 18 MeV electrons  Narrative: The patient tolerated radiation treatment relatively well.   He unfortunately had moist desquamation toward the end of treatment which was treated with Neosporin Biafine. I asked her to call if she had further questions or concerns. I will plan on seeing her back in a month.   Plan: The patient has completed radiation treatment. The patient will return to radiation oncology clinic for routine followup in one month. I advised them to call or return sooner if they have any questions or concerns related to their recovery or treatment.I can certainly see her earlier if her skin has not improved. She will continue on Herceptin under the care of Dr. Darnelle Catalan.  ------------------------------------------------  Lurline Hare, MD

## 2013-07-25 ENCOUNTER — Ambulatory Visit (HOSPITAL_BASED_OUTPATIENT_CLINIC_OR_DEPARTMENT_OTHER): Payer: 59 | Admitting: Oncology

## 2013-07-25 ENCOUNTER — Other Ambulatory Visit (HOSPITAL_BASED_OUTPATIENT_CLINIC_OR_DEPARTMENT_OTHER): Payer: 59 | Admitting: Lab

## 2013-07-25 ENCOUNTER — Telehealth: Payer: Self-pay | Admitting: *Deleted

## 2013-07-25 ENCOUNTER — Ambulatory Visit (HOSPITAL_BASED_OUTPATIENT_CLINIC_OR_DEPARTMENT_OTHER): Payer: 59

## 2013-07-25 VITALS — BP 137/83 | HR 97 | Temp 97.7°F | Resp 20 | Ht 64.0 in | Wt 172.8 lb

## 2013-07-25 DIAGNOSIS — C50319 Malignant neoplasm of lower-inner quadrant of unspecified female breast: Secondary | ICD-10-CM

## 2013-07-25 DIAGNOSIS — Z5112 Encounter for antineoplastic immunotherapy: Secondary | ICD-10-CM

## 2013-07-25 DIAGNOSIS — C50911 Malignant neoplasm of unspecified site of right female breast: Secondary | ICD-10-CM

## 2013-07-25 DIAGNOSIS — Z170 Estrogen receptor positive status [ER+]: Secondary | ICD-10-CM

## 2013-07-25 LAB — CBC WITH DIFFERENTIAL/PLATELET
BASO%: 0.4 % (ref 0.0–2.0)
EOS%: 3.4 % (ref 0.0–7.0)
HCT: 37 % (ref 34.8–46.6)
MCH: 27.3 pg (ref 25.1–34.0)
MCHC: 33.5 g/dL (ref 31.5–36.0)
NEUT%: 61.7 % (ref 38.4–76.8)
RBC: 4.54 10*6/uL (ref 3.70–5.45)
lymph#: 1.4 10*3/uL (ref 0.9–3.3)
nRBC: 0 % (ref 0–0)

## 2013-07-25 MED ORDER — HEPARIN SOD (PORK) LOCK FLUSH 100 UNIT/ML IV SOLN
500.0000 [IU] | Freq: Once | INTRAVENOUS | Status: AC | PRN
  Administered 2013-07-25: 500 [IU]
  Filled 2013-07-25: qty 5

## 2013-07-25 MED ORDER — SODIUM CHLORIDE 0.9 % IV SOLN
6.0000 mg/kg | Freq: Once | INTRAVENOUS | Status: AC
  Administered 2013-07-25: 462 mg via INTRAVENOUS
  Filled 2013-07-25: qty 22

## 2013-07-25 MED ORDER — ACETAMINOPHEN 325 MG PO TABS
650.0000 mg | ORAL_TABLET | Freq: Once | ORAL | Status: AC
  Administered 2013-07-25: 650 mg via ORAL

## 2013-07-25 MED ORDER — SODIUM CHLORIDE 0.9 % IJ SOLN
10.0000 mL | INTRAMUSCULAR | Status: DC | PRN
  Administered 2013-07-25: 10 mL
  Filled 2013-07-25: qty 10

## 2013-07-25 MED ORDER — SODIUM CHLORIDE 0.9 % IV SOLN
Freq: Once | INTRAVENOUS | Status: AC
  Administered 2013-07-25: 10:00:00 via INTRAVENOUS

## 2013-07-25 NOTE — Telephone Encounter (Signed)
appts made and printed. Pt is aware that tx will be added. i emailed MW to add the tx...td 

## 2013-07-25 NOTE — Patient Instructions (Signed)
[facility] Discharge Instructions for Patients Receiving Chemotherapy  Today you received the following chemotherapy agents Herceptin.  To help prevent nausea and vomiting after your treatment, we encourage you to take your nausea medication as prescribed.   If you develop nausea and vomiting that is not controlled by your nausea medication, call the clinic.   BELOW ARE SYMPTOMS THAT SHOULD BE REPORTED IMMEDIATELY:  *FEVER GREATER THAN 100.5 F  *CHILLS WITH OR WITHOUT FEVER  NAUSEA AND VOMITING THAT IS NOT CONTROLLED WITH YOUR NAUSEA MEDICATION  *UNUSUAL SHORTNESS OF BREATH  *UNUSUAL BRUISING OR BLEEDING  TENDERNESS IN MOUTH AND THROAT WITH OR WITHOUT PRESENCE OF ULCERS  *URINARY PROBLEMS  *BOWEL PROBLEMS  UNUSUAL RASH Items with * indicate a potential emergency and should be followed up as soon as possible.  Feel free to call the clinic you have any questions or concerns. The clinic phone number is [phone].    

## 2013-07-25 NOTE — Telephone Encounter (Signed)
Per staff message and POF I have scheduled appts.  JMW  

## 2013-07-25 NOTE — Progress Notes (Signed)
ID: Anita Randolph   DOB: 10/05/1951  MR#: [id number]  WUJ#:[id number]  PCP: Mila Palmer GYN: Meredeth Ide SU: Cicero Duck OTHER MD: Arvilla Meres, Amy Swaziland, Rick Cornella   HISTORY OF PRESENT ILLNESS: Anita Randolph herself palpated a mass in her right breast 11/09/2012. She brought this to Dr. Harlene Salts attention and mammography was obtained at [facility], (I do not have that report today) confirming a mass in the lower inner quadrant of the right breast. 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  Bilateral breast MRI 11/22/2012 showed a far posterior right breast mass measuring 3.2 cm with no discernible fat plane between the mass and the underlying pectoralis major. There was extension of irregular enhancement into the superficial aspect of the cholesterol is muscle underlying the mass, but there was no evidence for chest wall involvement, internal mammary artery chain or axillary lymph node involvement, or anything else in the breast or the contralateral breast.  The patient's subsequent history is as detailed below  INTERVAL HISTORY: Anita Randolph returns today for followup of her locally advanced right breast carcinoma. Since her last visit here she completed her radiation treatments. She did generally well with that, and was able to continue to work full-time right through them.  REVIEW OF SYSTEMS: She is tolerating the Herceptin with no side effects that she is aware of. She did have some peeling from the radiation but that is already resolving. Otherwise she has a very complex schedule and she wanted to schedule her next Herceptin treatments under specific dates, which are being operationalized. A detailed review of systems today was otherwise entirely negative  PAST MEDICAL HISTORY: Past Medical History  Diagnosis  Date  . Cancer     breast  . Breast cancer 11/15/12    Right invasive ductal ca  . Neuromuscular disorder     tingling hands and feet from chemo-getting better  . Anemia     s/p chemotherapy  . GERD (gastroesophageal reflux disease)     only during chemotherapy  . Hypertension     off b/p 7 years ago, resolved at present    PAST SURGICAL HISTORY: Past Surgical History  Procedure Laterality Date  . Portacath placement  12/20/2012    Procedure: INSERTION PORT-A-CATH;  Surgeon: Currie Paris, MD;  Location: [facility] OR;  Service: General;  Laterality: N/A;  . Tonsillectomy    . Arm skin lesion biopsy / excision  2011    nodular faciitis lt arm  . Colonoscopy    . Breast lumpectomy with needle localization and axillary sentinel lymph node bx Right 05/07/2013    Procedure: NEEDLE LOCALIZATION RIGHT BREAST LUMPECTOMY AND   SENTINEL LYMPH NODE  ;  Surgeon: Currie Paris, MD;  Location: [facility];  Service: General;  Laterality: Right;    FAMILY HISTORY Family History  Problem Relation Age of Onset  . Breast cancer Mother 62  . Heart disease Father   . Bladder Cancer Father 49  . Breast cancer Maternal Aunt     diagnosed in her 32s  . Multiple sclerosis Paternal Aunt   . Heart disease Maternal Grandmother   . Mental retardation Cousin     maternal cousins; brothers, sons of aunt with breast cancer   the patient's father immigrated from [place] 1938, living in [place] for about 10 years. He had  significant coronary artery disease, bladder cancer, and a ruptured aneurysm, but lived to be 91. The patient's mother is living at age 54. The patient has one brother, no sisters. The patient's mother had breast cancer diagnosed at age 75. The patient is of a Ashkenazi Jewish descent. She will be meeting with our genetics counselor today  GYNECOLOGIC HISTORY: Menarche age 70, first live birth age 66, she is GX P2, menopause 2006, received hormone replacement until  approximately 2010.  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 Her husband Anita Randolph has a management position for [employer]. This involves a lot of traveling. Son Anita Randolph lives in [place] and is a sports Banker. Daughter Anita Randolph lives in [place], where she is a Gaffer in international developments and is also getting an MPH in Development worker, international aid. The patient has no grandchildren.   ADVANCED DIRECTIVES: In place  HEALTH MAINTENANCE: History  Substance Use Topics  . Smoking status: Former Smoker -- 1.00 packs/day for 4 years    Types: Cigarettes    Quit date: 11/30/1974  . Smokeless tobacco: Never Used  . Alcohol Use: Yes     Comment: glass of wine on weekend   Brenly exercises 3-4 times a week, doing weights, walking, and other structures exercises.   Colonoscopy: 2009/ [facility]  PAP: 2013  Bone density: 2010  Lipid panel:  No Known Allergies  Current Outpatient Prescriptions  Medication Sig Dispense Refill  . calcium-vitamin D (OSCAL WITH D) 500-200 MG-UNIT per tablet Take 1 tablet by mouth 2 (two) times daily. 600 vit d      . cephALEXin (KEFLEX) 500 MG capsule Take 1 capsule (500 mg total) by mouth 3 (three) times daily.  21 capsule  0  . diphenoxylate-atropine (LOMOTIL) 2.5-0.025 MG per tablet Take 1 tablet by mouth every 4 (four) hours as needed for diarrhea or loose stools (up to 6 per day).  30 tablet  0  . emollient (BIAFINE) cream Apply 1 application topically as needed.      . hyaluronate sodium (RADIAPLEXRX) GEL Apply 1 application topically 2 (two) times daily. Apply after rad tx and bedtime&prn      . non-metallic deodorant (ALRA) MISC Apply 1 application topically daily as needed. Apply after rad tx      . simvastatin (ZOCOR) 40 MG tablet Take 40 mg by mouth daily.       No current facility-administered medications for this visit.    OBJECTIVE: Middle-aged white woman who appears well Filed Vitals:    07/25/13 0850  BP: 137/83  Pulse: 97  Temp: 97.7 F (36.5 C)  Resp: 20     Body mass index is 29.65 kg/(m^2).    ECOG FS: 0 Filed Weights   07/25/13 0850  Weight: 172 lb 12.8 oz (78.382 kg)   Sclerae unicteric Oropharynx clear No cervical or supraclavicular adenopathy Lungs clear to auscultation bilaterally Heart regular rate and rhythm Abdomen soft, nontender, positive bowel sounds MSK no focal spinal tenderness No peripheral edema Neuro: nonfocal, well oriented with positive affect Breasts:  The right breast is status post lumpectomy and radiation. In addition to the expected erythema there is some dry desquamation. There are no suspicious masses, or skin changes of concern. The right axilla is benign. The left breast is unremarkable. Samuel Bouche is intact in the left upper chest wall     LAB RESULTS: CBC    Component Value Date/Time   WBC 5.0 07/25/2013 0831  RBC 4.54 07/25/2013 0831   HGB 12.4 07/25/2013 0831   HCT 37.0 07/25/2013 0831   PLT 198 07/25/2013 0831   MCV 81.5 07/25/2013 0831   MCH 27.3 07/25/2013 0831   MCHC 33.5 07/25/2013 0831   RDW 13.9 07/25/2013 0831   LYMPHSABS 1.4 07/25/2013 0831   MONOABS 0.4 07/25/2013 0831   EOSABS 0.2 07/25/2013 0831   BASOSABS 0.0 07/25/2013 0831       Chemistry      Component Value Date/Time   NA 140 07/04/2013 0909   K 4.2 07/04/2013 0909   CL 109* 05/02/2013 1043   CO2 25 07/04/2013 0909   BUN 16.5 07/04/2013 0909   BUN 15 08/23/2010 1350   CREATININE 1.0 07/04/2013 0909   CREATININE 1.00 08/23/2010 1350      Component Value Date/Time   CALCIUM 9.7 07/04/2013 0909        STUDIES: Dg Bone Density  07/17/2013   *RADIOLOGY REPORT*  Clinical Data:  Postmenopausal.  DUAL X-RAY ABSORPTIOMETRY (DXA) FOR BONE MINERAL DENSITY  AP LUMBAR SPINE  Bone Mineral Density (BMD):            0.908 g/cm2 Young Adult T Score:                          -1.3 Z Score:                                                0.2  LEFT FEMUR NECK  Bone Mineral  Density (BMD):             0.638 g/cm2 Young Adult T Score:                           -1.9 Z Score:                                                 -0.6  ASSESSMENT:  Patient's diagnostic category is LOW BONE MASS by WHO Criteria.  FRACTURE RISK: MODERATE  FRAX: Based on the World Health Organization FRAX model, the 10 year probability of a major osteoporotic fracture is 8.9%.  The 10 year probability of a hip fracture is 1.0%.  .  COMPARISON: None.  RECOMMENDATIONS:   All patients should ensure an adequate intake of dietary calcium (1200mg  daily) and vitamin D (800 IU daily) unless contraindicated. The National Osteoporosis Foundation recommends that FDA-approved medical therapies be considered in postmenopausal women and mean age 55 or older with a:        1)     Hip or vertebral (clinical or morphometric) fracture.           2)    T-score of -2.5 or lower at the spine or hip. 3)    Ten-year fracture probability by FRAX of 3% or greater for hip fracture or 20% or greater for major osteoporotic fracture. FOLLOW-UP:  People with diagnosed cases of osteoporosis or at high risk for fracture should have regular bone mineral density tests.  For patients eligible for Medicare, routine testing is allowed once every 2 years.  The testing frequency can be increased to one year for patients  who have rapidly progressing disease, those who are receiving or discontinuing medical therapy to restore bone mass, or have additional risk factors.   Original Report Authenticated By: Beckie Salts, M.D.   ASSESSMENT: 62 y.o. [place] woman   (1)  s/p right breast biopsy 11/15/2012 for a clinical T2 N0, stage IIA invasive ductal carcinoma, grade 3, with superficial involvement of the pectoralis muscle, estrogen receptor 29% positive, progesterone receptor negative, with an Mib-1 of 79% and HER-2 amplification by CISH with a ratio of 4.39  (2) treated in the neoadjuvant setting, completing 6 cycles of docetaxel/ carboplatin/  trastuzumab 04/11/2013  (3) trastuzumab will be continued for total of one year (through January 2015).-- Most recent echo 03/25/2013 showed a well-preserved ejecton fraction  (4) right lumpectomy and sentinel lymph node sampling 05/07/2013 showed a pathologic complete response  (5) adjuvant radiation completed 07/22/2013  PLAN:  Julieta we'll continue the Herceptin.into mid January. have entered the next Herceptin treatment on the dates that she requested. Once she has fully recovered from the radiation and we should be starting anti-estrogens. This needs to be discussed in more detail and I am making her an appointment with me September 17 to finalize that decision. The fact that she has a normal bone density lesion in the finger anastrozole may be the best choice for her.  Overall she is doing terrific. She knows to call for any problems that may develop before the next visit and she really has an echo scheduled for October.  Ching Rabideau C    07/25/2013

## 2013-07-26 ENCOUNTER — Other Ambulatory Visit: Payer: 59 | Admitting: Lab

## 2013-07-26 ENCOUNTER — Ambulatory Visit: Payer: 59 | Admitting: Oncology

## 2013-07-26 ENCOUNTER — Telehealth: Payer: Self-pay | Admitting: *Deleted

## 2013-07-26 NOTE — Telephone Encounter (Signed)
Lm informed the pt that her alb appt for 09/05/13 was changed to 1:45pm and her other visits will follow...td

## 2013-07-28 ENCOUNTER — Other Ambulatory Visit: Payer: Self-pay | Admitting: Oncology

## 2013-07-28 DIAGNOSIS — C50919 Malignant neoplasm of unspecified site of unspecified female breast: Secondary | ICD-10-CM

## 2013-08-01 ENCOUNTER — Telehealth: Payer: Self-pay | Admitting: *Deleted

## 2013-08-01 NOTE — Telephone Encounter (Signed)
Returned pt call. Lm for her to give me a call back....td

## 2013-08-01 NOTE — Telephone Encounter (Signed)
Patient called and moved her treatment appt from 9/18 to 9/17

## 2013-08-12 ENCOUNTER — Encounter: Payer: Self-pay | Admitting: Oncology

## 2013-08-14 ENCOUNTER — Ambulatory Visit (HOSPITAL_BASED_OUTPATIENT_CLINIC_OR_DEPARTMENT_OTHER): Payer: 59

## 2013-08-14 ENCOUNTER — Ambulatory Visit (HOSPITAL_BASED_OUTPATIENT_CLINIC_OR_DEPARTMENT_OTHER): Payer: 59 | Admitting: Oncology

## 2013-08-14 ENCOUNTER — Telehealth: Payer: Self-pay | Admitting: *Deleted

## 2013-08-14 ENCOUNTER — Other Ambulatory Visit (HOSPITAL_BASED_OUTPATIENT_CLINIC_OR_DEPARTMENT_OTHER): Payer: 59 | Admitting: Lab

## 2013-08-14 ENCOUNTER — Other Ambulatory Visit: Payer: 59 | Admitting: Lab

## 2013-08-14 VITALS — BP 127/81 | HR 80 | Temp 97.4°F | Resp 18 | Ht 64.0 in | Wt 171.1 lb

## 2013-08-14 DIAGNOSIS — M899 Disorder of bone, unspecified: Secondary | ICD-10-CM

## 2013-08-14 DIAGNOSIS — C50319 Malignant neoplasm of lower-inner quadrant of unspecified female breast: Secondary | ICD-10-CM

## 2013-08-14 DIAGNOSIS — Z5112 Encounter for antineoplastic immunotherapy: Secondary | ICD-10-CM

## 2013-08-14 DIAGNOSIS — C50511 Malignant neoplasm of lower-outer quadrant of right female breast: Secondary | ICD-10-CM | POA: Insufficient documentation

## 2013-08-14 DIAGNOSIS — C50911 Malignant neoplasm of unspecified site of right female breast: Secondary | ICD-10-CM

## 2013-08-14 DIAGNOSIS — Z170 Estrogen receptor positive status [ER+]: Secondary | ICD-10-CM

## 2013-08-14 LAB — CBC WITH DIFFERENTIAL/PLATELET
EOS%: 3.4 % (ref 0.0–7.0)
MCH: 26.7 pg (ref 25.1–34.0)
MCV: 80.2 fL (ref 79.5–101.0)
MONO%: 6.8 % (ref 0.0–14.0)
NEUT#: 3.1 10*3/uL (ref 1.5–6.5)
RBC: 4.79 10*6/uL (ref 3.70–5.45)
RDW: 14.3 % (ref 11.2–14.5)

## 2013-08-14 LAB — COMPREHENSIVE METABOLIC PANEL (CC13)
ALT: 28 U/L (ref 0–55)
AST: 27 U/L (ref 5–34)
Albumin: 3.5 g/dL (ref 3.5–5.0)
Alkaline Phosphatase: 83 U/L (ref 40–150)
Potassium: 3.7 mEq/L (ref 3.5–5.1)
Sodium: 139 mEq/L (ref 136–145)
Total Protein: 6.6 g/dL (ref 6.4–8.3)

## 2013-08-14 MED ORDER — TRASTUZUMAB CHEMO INJECTION 440 MG
6.0000 mg/kg | Freq: Once | INTRAVENOUS | Status: AC
  Administered 2013-08-14: 462 mg via INTRAVENOUS
  Filled 2013-08-14: qty 22

## 2013-08-14 MED ORDER — SODIUM CHLORIDE 0.9 % IJ SOLN
10.0000 mL | INTRAMUSCULAR | Status: DC | PRN
  Administered 2013-08-14: 10 mL
  Filled 2013-08-14: qty 10

## 2013-08-14 MED ORDER — ACETAMINOPHEN 325 MG PO TABS
650.0000 mg | ORAL_TABLET | Freq: Once | ORAL | Status: AC
  Administered 2013-08-14: 650 mg via ORAL

## 2013-08-14 MED ORDER — SODIUM CHLORIDE 0.9 % IV SOLN
Freq: Once | INTRAVENOUS | Status: AC
  Administered 2013-08-14: 16:00:00 via INTRAVENOUS

## 2013-08-14 MED ORDER — ACETAMINOPHEN 325 MG PO TABS
ORAL_TABLET | ORAL | Status: AC
  Filled 2013-08-14: qty 2

## 2013-08-14 MED ORDER — HEPARIN SOD (PORK) LOCK FLUSH 100 UNIT/ML IV SOLN
500.0000 [IU] | Freq: Once | INTRAVENOUS | Status: AC | PRN
  Administered 2013-08-14: 500 [IU]
  Filled 2013-08-14: qty 5

## 2013-08-14 NOTE — Progress Notes (Signed)
Patient ID: Anita Randolph, female   DOB: [birth year], 62 y.o.   MRN: [id number] ID: [name]   DOB: [birth year]  MR#: [id number]  EXB#:[id number]  PCP: Mila Palmer GYN: Meredeth Ide SU: Cicero Duck OTHER MD: Arvilla Meres, Amy Swaz[place], Rick Cornella   HISTORY OF PRESENT ILLNESS: Anita Randolph herself palpated a mass in her right breast 11/09/2012. She brought this to Dr. Harlene Salts attention and mammography was obtained at [facility], (I do not have that report today) confirming a mass in the lower inner quadrant of the right breast. 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  Bilateral breast MRI 11/22/2012 showed a far posterior right breast mass measuring 3.2 cm with no discernible fat plane between the mass and the underlying pectoralis major. There was extension of irregular enhancement into the superficial aspect of the cholesterol is muscle underlying the mass, but there was no evidence for chest wall involvement, internal mammary artery chain or axillary lymph node involvement, or anything else in the breast or the contralateral breast.  The patient's subsequent history is as detailed below  INTERVAL HISTORY: [name] returns today for followup of her breast cancer. She has completed her radiation treatments and is tolerating the trastuzumab without any side effects that she is aware of.  REVIEW OF SYSTEMS: She is back to work full-time, really dizzy, and just went back to the gym. It felt "great". She has a trainer that she has worked with for a long time and whom she finds really helpful. Her hair is about an inch long, a little thinner and less curly than before, but more salt-and-pepper. She is about to meet with her hairdresser to "fix". Otherwise a detailed review of systems today was noncontributory.  PAST  MEDICAL HISTORY: Past Medical History  Diagnosis Date  . Cancer     breast  . Breast cancer 11/15/12    Right invasive ductal ca  . Neuromuscular disorder     tingling hands and feet from chemo-getting better  . Anemia     s/p chemotherapy  . GERD (gastroesophageal reflux disease)     only during chemotherapy  . Hypertension     off b/p 7 years ago, resolved at present    PAST SURGICAL HISTORY: Past Surgical History  Procedure Laterality Date  . Portacath placement  12/20/2012    Procedure: INSERTION PORT-A-CATH;  Surgeon: Currie Paris, MD;  Location: [facility] OR;  Service: General;  Laterality: N/A;  . Tonsillectomy    . Arm skin lesion biopsy / excision  2011    nodular faciitis lt arm  . Colonoscopy    . Breast lumpectomy with needle localization and axillary sentinel lymph node bx Right 05/07/2013    Procedure: NEEDLE LOCALIZATION RIGHT BREAST LUMPECTOMY AND   SENTINEL LYMPH NODE  ;  Surgeon: Currie Paris, MD;  Location: [facility];  Service: General;  Laterality: Right;    FAMILY HISTORY Family History  Problem Relation Age of Onset  . Breast cancer Mother 36  . Heart disease Father   . Bladder Cancer Father 40  . Breast cancer Maternal Aunt     diagnosed in her 22s  . Multiple sclerosis Paternal Aunt   . Heart disease Maternal Grandmother   . Mental retardation Cousin     maternal cousins; brothers, sons of aunt with  breast cancer   the patient's father immigrated from [place] 1938, living in [place] for about 10 years. He had significant coronary artery disease, bladder cancer, and a ruptured aneurysm, but lived to be 91. The patient's mother is living at age 108. The patient has one brother, no sisters. The patient's mother had breast cancer diagnosed at age 11. The patient is of a Ashkenazi Jewish descent. She will be meeting with our genetics counselor today  GYNECOLOGIC HISTORY: Menarche age 25, first live birth age 81, she is GX P2, menopause  2006, received hormone replacement until approximately 2010.  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 Her husband [name] has a management position for [employer]. This involves a lot of traveling. Son [name] lives in [place] and is a sports Banker. Daughter [name] lives in [place], where she is a Gaffer in international developments and is also getting an MPH in Development worker, international aid. The patient has no grandchildren.   ADVANCED DIRECTIVES: In place  HEALTH MAINTENANCE: History  Substance Use Topics  . Smoking status: Former Smoker -- 1.00 packs/day for 4 years    Types: Cigarettes    Quit date: 11/30/1974  . Smokeless tobacco: Never Used  . Alcohol Use: Yes     Comment: glass of wine on weekend   Anasophia exercises 3-4 times a week, doing weights, walking, and other structures exercises.   Colonoscopy: 2009/ [facility]  PAP: 2013  Bone density: AUG 2014 shows T -1.9  Lipid panel:  No Known Allergies  Current Outpatient Prescriptions  Medication Sig Dispense Refill  . calcium-vitamin D (OSCAL WITH D) 500-200 MG-UNIT per tablet Take 1 tablet by mouth 2 (two) times daily. 600 vit d      . cephALEXin (KEFLEX) 500 MG capsule Take 1 capsule (500 mg total) by mouth 3 (three) times daily.  21 capsule  0  . diphenoxylate-atropine (LOMOTIL) 2.5-0.025 MG per tablet Take 1 tablet by mouth every 4 (four) hours as needed for diarrhea or loose stools (up to 6 per day).  30 tablet  0  . emollient (BIAFINE) cream Apply 1 application topically as needed.      . hyaluronate sodium (RADIAPLEXRX) GEL Apply 1 application topically 2 (two) times daily. Apply after rad tx and bedtime&prn      . non-metallic deodorant (ALRA) MISC Apply 1 application topically daily as needed. Apply after rad tx      . simvastatin (ZOCOR) 40 MG tablet Take 40 mg by mouth daily.       No current facility-administered medications for this visit.     OBJECTIVE: Middle-aged white woman in no acute distress Filed Vitals:   08/14/13 1413  BP: 127/81  Pulse: 80  Temp: 97.4 F (36.3 C)  Resp: 18     Body mass index is 29.35 kg/(m^2).    ECOG FS: 0 Filed Weights   08/14/13 1413  Weight: 77.61 kg (171 lb 1.6 oz)   Sclerae unicteric, pupils equal round and reactive to light Oropharynx shows no thrush, dentition is good No cervical or supraclavicular adenopathy Lungs clear to auscultation, good excursion bilaterally Heart regular rate and rhythm, no murmur appreciated Abdomen soft, nontender, positive bowel sounds MSK no focal spinal tenderness, no kyphosis or scoliosis No joint edema, no rash Neuro: nonfocal, well oriented with positive affect Breasts:  The right breast is status post lumpectomy and radiation. It is still slightly hyperpigmented as compared to the left. There is no evidence  of disease recurrence. The right axilla is benign. The left breast is unremarkable.   Port is intact in the left upper chest wall     LAB RESULTS: CBC    Component Value Date/Time   WBC 5.0 08/14/2013 1400   RBC 4.79 08/14/2013 1400   HGB 12.8 08/14/2013 1400   HCT 38.4 08/14/2013 1400   PLT 209 08/14/2013 1400   MCV 80.2 08/14/2013 1400   MCH 26.7 08/14/2013 1400   MCHC 33.3 08/14/2013 1400   RDW 14.3 08/14/2013 1400   LYMPHSABS 1.4 08/14/2013 1400   MONOABS 0.3 08/14/2013 1400   EOSABS 0.2 08/14/2013 1400   BASOSABS 0.0 08/14/2013 1400       Chemistry      Component Value Date/Time   NA 140 07/04/2013 0909   K 4.2 07/04/2013 0909   CL 109* 05/02/2013 1043   CO2 25 07/04/2013 0909   BUN 16.5 07/04/2013 0909   BUN 15 08/23/2010 1350   CREATININE 1.0 07/04/2013 0909   CREATININE 1.00 08/23/2010 1350      Component Value Date/Time   CALCIUM 9.7 07/04/2013 0909        STUDIES: Dg Bone Density  07/17/2013   *RADIOLOGY REPORT*  Clinical Data:  Postmenopausal.  DUAL X-RAY ABSORPTIOMETRY (DXA) FOR BONE MINERAL DENSITY  AP LUMBAR SPINE  Bone  Mineral Density (BMD):            0.908 g/cm2 Young Adult T Score:                          -1.3 Z Score:                                                0.2  LEFT FEMUR NECK  Bone Mineral Density (BMD):             0.638 g/cm2 Young Adult T Score:                           -1.9 Z Score:                                                 -0.6  ASSESSMENT:  Patient's diagnostic category is LOW BONE MASS by WHO Criteria.  FRACTURE RISK: MODERATE  FRAX: Based on the World Health Organization FRAX model, the 10 year probability of a major osteoporotic fracture is 8.9%.  The 10 year probability of a hip fracture is 1.0%.  .  COMPARISON: None.  RECOMMENDATIONS:   All patients should ensure an adequate intake of dietary calcium (1200mg  daily) and vitamin D (800 IU daily) unless contraindicated. The National Osteoporosis Foundation recommends that FDA-approved medical therapies be considered in postmenopausal women and mean age 30 or older with a:        1)     Hip or vertebral (clinical or morphometric) fracture.           2)    T-score of -2.5 or lower at the spine or hip. 3)    Ten-year fracture probability by FRAX of 3% or greater for hip fracture or 20% or greater for major osteoporotic fracture. FOLLOW-UP:  People with diagnosed cases of osteoporosis or at high risk for fracture should have regular bone mineral density tests.  For patients eligible for Medicare, routine testing is allowed once every 2 years.  The testing frequency can be increased to one year for patients who have rapidly progressing disease, those who are receiving or discontinuing medical therapy to restore bone mass, or have additional risk factors.   Original Report Authenticated By: Beckie Salts, M.D.   ASSESSMENT: 62 y.o. [place] woman   (1)  s/p right breast biopsy 11/15/2012 for a clinical T2 N0, stage IIA invasive ductal carcinoma, grade 3, with superficial involvement of the pectoralis muscle, estrogen receptor 29% positive, progesterone  receptor negative, with an Mib-1 of 79% and HER-2 amplification by CISH with a ratio of 4.39  (2) treated in the neoadjuvant setting, completing 6 cycles of docetaxel/ carboplatin/ trastuzumab 04/11/2013  (3) trastuzumab will be continued for total of one year (through January 2015).-- Most recent echo 06/25/2013 showed a well-preserved ejecton fraction  (4) right lumpectomy and sentinel lymph node sampling 05/07/2013 showed a pathologic complete response  (5) adjuvant radiation completed 07/22/2013  PLAN:  Anea has recovered fine from her radiation, and is now ready to start anti-estrogens. We spent over 40 minutes discussing the various toxicities, side effects and complications of tamoxifen as compared to the aromatase inhibitors. We'll so reviewed her bone density, which now shows osteopenia.  After much discussion it became clear that if she did take anastrozole then she would probably want to take week last on a once a year basis as well. In that case the big problem we could not solve his vaginal dryness. She took tamoxifen we can fix the vaginal dryness problem and it strengthens the bones, the big concern there of course aside from clots, is the possibility of uterine cancer, which we do not have a screening for.  After much discussion she can quite decide so she took only recommend her to home and will be calling us. I expect she will start an antiestrogen sometime this week or next. I am going to see her again in mid January, just as she completes her trastuzumab treatments. If she did start anastrozole we will probably give her a recliner that day. At that point we should be able to start long-term followup  Rober Skeels C    08/14/2013

## 2013-08-14 NOTE — Patient Instructions (Signed)
[facility] Discharge Instructions for Patients Receiving Chemotherapy  Today you received the following chemotherapy agents Herceptin  To help prevent nausea and vomiting after your treatment, we encourage you to take your nausea medication     If you develop nausea and vomiting that is not controlled by your nausea medication, call the clinic.   BELOW ARE SYMPTOMS THAT SHOULD BE REPORTED IMMEDIATELY:  *FEVER GREATER THAN 100.5 F  *CHILLS WITH OR WITHOUT FEVER  NAUSEA AND VOMITING THAT IS NOT CONTROLLED WITH YOUR NAUSEA MEDICATION  *UNUSUAL SHORTNESS OF BREATH  *UNUSUAL BRUISING OR BLEEDING  TENDERNESS IN MOUTH AND THROAT WITH OR WITHOUT PRESENCE OF ULCERS  *URINARY PROBLEMS  *BOWEL PROBLEMS  UNUSUAL RASH Items with * indicate a potential emergency and should be followed up as soon as possible.  Feel free to call the clinic you have any questions or concerns. The clinic phone number is [phone].    

## 2013-08-14 NOTE — Telephone Encounter (Signed)
appts made and printed. Pt is aware that her tx's will be added. i emailed MW to add the tx's...td 

## 2013-08-15 ENCOUNTER — Ambulatory Visit: Payer: 59

## 2013-08-15 ENCOUNTER — Telehealth: Payer: Self-pay | Admitting: *Deleted

## 2013-08-15 ENCOUNTER — Other Ambulatory Visit: Payer: 59 | Admitting: Lab

## 2013-08-15 NOTE — Telephone Encounter (Signed)
Per staff message and POF I have scheduled appts.  JMW  

## 2013-08-16 ENCOUNTER — Other Ambulatory Visit: Payer: 59 | Admitting: Lab

## 2013-08-18 ENCOUNTER — Encounter: Payer: Self-pay | Admitting: Oncology

## 2013-08-20 ENCOUNTER — Encounter (INDEPENDENT_AMBULATORY_CARE_PROVIDER_SITE_OTHER): Payer: 59 | Admitting: Surgery

## 2013-08-20 ENCOUNTER — Other Ambulatory Visit: Payer: Self-pay | Admitting: Oncology

## 2013-08-20 DIAGNOSIS — C50919 Malignant neoplasm of unspecified site of unspecified female breast: Secondary | ICD-10-CM

## 2013-08-20 MED ORDER — TAMOXIFEN CITRATE 20 MG PO TABS: 20.0000 mg | ORAL_TABLET | Freq: Every day | ORAL | Status: DC

## 2013-08-28 ENCOUNTER — Ambulatory Visit (INDEPENDENT_AMBULATORY_CARE_PROVIDER_SITE_OTHER): Payer: 59 | Admitting: Surgery

## 2013-08-28 ENCOUNTER — Encounter (INDEPENDENT_AMBULATORY_CARE_PROVIDER_SITE_OTHER): Payer: Self-pay | Admitting: Surgery

## 2013-08-28 VITALS — BP 120/78 | HR 68 | Temp 97.2°F | Resp 14 | Ht 64.0 in | Wt 170.6 lb

## 2013-08-28 DIAGNOSIS — C50919 Malignant neoplasm of unspecified site of unspecified female breast: Secondary | ICD-10-CM

## 2013-08-28 DIAGNOSIS — C50911 Malignant neoplasm of unspecified site of right female breast: Secondary | ICD-10-CM

## 2013-08-28 NOTE — Patient Instructions (Signed)
Make an appointment in January or February, after your oncologist let you know that the Port-A-Cath can be removed. Surgery to remove it can then be scheduled.

## 2013-08-28 NOTE — Progress Notes (Signed)
She comes back for a postop visit after completion of radiation therapy for her right breast cancer. She overall is doing well.  On exam she has some radiation changes including some peau d'orange, some rigid tissue at lumpectomy site, and some increased pigmentation. She just finished radiation 2 weeks ago  She will complete her Herceptin therapy in January. She'll get confirmation from her oncologist, and call to make an appointment here so she can be seen and her Port-A-Cath removal can be scheduled.

## 2013-08-30 ENCOUNTER — Encounter: Payer: Self-pay | Admitting: Radiation Oncology

## 2013-09-05 ENCOUNTER — Ambulatory Visit (HOSPITAL_BASED_OUTPATIENT_CLINIC_OR_DEPARTMENT_OTHER): Payer: 59

## 2013-09-05 ENCOUNTER — Other Ambulatory Visit: Payer: 59 | Admitting: Lab

## 2013-09-05 ENCOUNTER — Other Ambulatory Visit (HOSPITAL_BASED_OUTPATIENT_CLINIC_OR_DEPARTMENT_OTHER): Payer: 59 | Admitting: Lab

## 2013-09-05 ENCOUNTER — Ambulatory Visit
Admission: RE | Admit: 2013-09-05 | Discharge: 2013-09-05 | Disposition: A | Discharge status: Home / Self Care | Payer: 59 | Source: Ambulatory Visit | Attending: Radiation Oncology | Admitting: Radiation Oncology

## 2013-09-05 VITALS — BP 122/80 | HR 76 | Temp 97.8°F | Resp 18

## 2013-09-05 DIAGNOSIS — C50911 Malignant neoplasm of unspecified site of right female breast: Secondary | ICD-10-CM

## 2013-09-05 DIAGNOSIS — C50511 Malignant neoplasm of lower-outer quadrant of right female breast: Secondary | ICD-10-CM

## 2013-09-05 DIAGNOSIS — C50319 Malignant neoplasm of lower-inner quadrant of unspecified female breast: Secondary | ICD-10-CM

## 2013-09-05 DIAGNOSIS — Z5112 Encounter for antineoplastic immunotherapy: Secondary | ICD-10-CM

## 2013-09-05 LAB — CBC WITH DIFFERENTIAL/PLATELET
Basophils Absolute: 0 10*3/uL (ref 0.0–0.1)
EOS%: 1.7 % (ref 0.0–7.0)
MCH: 26.9 pg (ref 25.1–34.0)
MCV: 79.7 fL (ref 79.5–101.0)
MONO%: 7.5 % (ref 0.0–14.0)
RBC: 4.64 10*6/uL (ref 3.70–5.45)
RDW: 14.9 % — ABNORMAL HIGH (ref 11.2–14.5)
nRBC: 0 % (ref 0–0)

## 2013-09-05 MED ORDER — SODIUM CHLORIDE 0.9 % IJ SOLN
10.0000 mL | INTRAMUSCULAR | Status: DC | PRN
  Administered 2013-09-05: 10 mL
  Filled 2013-09-05: qty 10

## 2013-09-05 MED ORDER — ACETAMINOPHEN 325 MG PO TABS
650.0000 mg | ORAL_TABLET | Freq: Once | ORAL | Status: AC
  Administered 2013-09-05: 650 mg via ORAL

## 2013-09-05 MED ORDER — ACETAMINOPHEN 325 MG PO TABS
ORAL_TABLET | ORAL | Status: AC
  Filled 2013-09-05: qty 2

## 2013-09-05 MED ORDER — TRASTUZUMAB CHEMO INJECTION 440 MG
6.0000 mg/kg | Freq: Once | INTRAVENOUS | Status: AC
  Administered 2013-09-05: 462 mg via INTRAVENOUS
  Filled 2013-09-05: qty 22

## 2013-09-05 MED ORDER — HEPARIN SOD (PORK) LOCK FLUSH 100 UNIT/ML IV SOLN
500.0000 [IU] | Freq: Once | INTRAVENOUS | Status: AC | PRN
  Administered 2013-09-05: 500 [IU]
  Filled 2013-09-05: qty 5

## 2013-09-05 MED ORDER — SODIUM CHLORIDE 0.9 % IV SOLN
Freq: Once | INTRAVENOUS | Status: AC
  Administered 2013-09-05: 14:00:00 via INTRAVENOUS

## 2013-09-05 NOTE — Progress Notes (Signed)
   Department of Radiation Oncology  Phone:  [phone] Fax:        [phone]   Name: Anita Randolph MRN: [id number]  DOB: 09/27/1951  Date: 09/05/2013  Follow Up Visit Note  Diagnosis: T2 N0 right breast cancer  Summary and Interval since last radiation: 1 month from completion of 60.4 gray to the right breast  Interval History: Anita Randolph presents today for routine followup.  She is feeling well and doing well. Her skin is still somewhat hyperpigmented but healing. She is finishing Herceptin in January. She overall has returned to full seed a head at work. She is tolerating the tamoxifen well.  Allergies: No Known Allergies  Medications:  Current Outpatient Prescriptions  Medication Sig Dispense Refill  . calcium-vitamin D (OSCAL WITH D) 500-200 MG-UNIT per tablet Take 1 tablet by mouth 2 (two) times daily. 600 vit d      . diphenoxylate-atropine (LOMOTIL) 2.5-0.025 MG per tablet Take 1 tablet by mouth every 4 (four) hours as needed for diarrhea or loose stools (up to 6 per day).  30 tablet  0  . simvastatin (ZOCOR) 40 MG tablet Take 40 mg by mouth daily.      . tamoxifen (NOLVADEX) 20 MG tablet Take 1 tablet (20 mg total) by mouth daily.  90 tablet  4   No current facility-administered medications for this encounter.   Facility-Administered Medications Ordered in Other Encounters  Medication Dose Route Frequency Provider Last Rate Last Dose  . sodium chloride 0.9 % injection 10 mL  10 mL Intracatheter PRN Lowella Dell, MD   10 mL at 09/05/13 1508    Physical Exam:  There were no vitals filed for this visit. she has large pendulous breasts bilaterally. She has hyperpigmentation and dry skin medially. There is hypermetabolic pigmentation in the inframammary fold of the right breast.  IMPRESSION: Anita Randolph is a 62 y.o. female status post breast conservation with resolving acute effects of treatment  PLAN:  We discussed some protection the treated area. I released her to use  regular lotion. She has followup scheduled with medical oncology. I will see her back on a when necessary basis. She knows she can contact me if any questions or concerns in the interim.    Lurline Hare, MD

## 2013-09-05 NOTE — Patient Instructions (Signed)
[facility] Discharge Instructions for Patients Receiving Chemotherapy  Today you received the following chemotherapy agent: Herceptin   To help prevent nausea and vomiting after your treatment, we encourage you to take your nausea medication as prescribed.    If you develop nausea and vomiting that is not controlled by your nausea medication, call the clinic.   BELOW ARE SYMPTOMS THAT SHOULD BE REPORTED IMMEDIATELY:  *FEVER GREATER THAN 100.5 F  *CHILLS WITH OR WITHOUT FEVER  NAUSEA AND VOMITING THAT IS NOT CONTROLLED WITH YOUR NAUSEA MEDICATION  *UNUSUAL SHORTNESS OF BREATH  *UNUSUAL BRUISING OR BLEEDING  TENDERNESS IN MOUTH AND THROAT WITH OR WITHOUT PRESENCE OF ULCERS  *URINARY PROBLEMS  *BOWEL PROBLEMS  UNUSUAL RASH Items with * indicate a potential emergency and should be followed up as soon as possible.  Feel free to call the clinic you have any questions or concerns. The clinic phone number is [phone].    

## 2013-09-06 ENCOUNTER — Other Ambulatory Visit: Payer: 59 | Admitting: Lab

## 2013-09-20 ENCOUNTER — Encounter: Payer: Self-pay | Admitting: Oncology

## 2013-09-25 ENCOUNTER — Telehealth: Payer: Self-pay | Admitting: Oncology

## 2013-09-25 ENCOUNTER — Other Ambulatory Visit: Payer: 59 | Admitting: Lab

## 2013-09-25 ENCOUNTER — Ambulatory Visit (HOSPITAL_BASED_OUTPATIENT_CLINIC_OR_DEPARTMENT_OTHER): Payer: 59

## 2013-09-25 ENCOUNTER — Telehealth: Payer: Self-pay | Admitting: *Deleted

## 2013-09-25 ENCOUNTER — Ambulatory Visit (HOSPITAL_BASED_OUTPATIENT_CLINIC_OR_DEPARTMENT_OTHER): Payer: 59 | Admitting: Oncology

## 2013-09-25 ENCOUNTER — Other Ambulatory Visit (HOSPITAL_BASED_OUTPATIENT_CLINIC_OR_DEPARTMENT_OTHER): Payer: 59 | Admitting: Lab

## 2013-09-25 VITALS — BP 122/81 | HR 77 | Temp 98.3°F | Resp 18 | Ht 64.0 in | Wt 170.6 lb

## 2013-09-25 DIAGNOSIS — C50319 Malignant neoplasm of lower-inner quadrant of unspecified female breast: Secondary | ICD-10-CM

## 2013-09-25 DIAGNOSIS — C50511 Malignant neoplasm of lower-outer quadrant of right female breast: Secondary | ICD-10-CM

## 2013-09-25 DIAGNOSIS — Z5112 Encounter for antineoplastic immunotherapy: Secondary | ICD-10-CM

## 2013-09-25 DIAGNOSIS — C50911 Malignant neoplasm of unspecified site of right female breast: Secondary | ICD-10-CM

## 2013-09-25 DIAGNOSIS — C50519 Malignant neoplasm of lower-outer quadrant of unspecified female breast: Secondary | ICD-10-CM

## 2013-09-25 LAB — CBC WITH DIFFERENTIAL/PLATELET
BASO%: 0.8 % (ref 0.0–2.0)
LYMPH%: 26.1 % (ref 14.0–49.7)
MCHC: 33.5 g/dL (ref 31.5–36.0)
MONO#: 0.3 10*3/uL (ref 0.1–0.9)
Platelets: 204 10*3/uL (ref 145–400)
RBC: 4.52 10*6/uL (ref 3.70–5.45)
WBC: 5 10*3/uL (ref 3.9–10.3)
nRBC: 0 % (ref 0–0)

## 2013-09-25 LAB — COMPREHENSIVE METABOLIC PANEL (CC13)
ALT: 21 U/L (ref 0–55)
AST: 27 U/L (ref 5–34)
Alkaline Phosphatase: 67 U/L (ref 40–150)
Calcium: 9.3 mg/dL (ref 8.4–10.4)
Chloride: 109 mEq/L (ref 98–109)
Creatinine: 1.2 mg/dL — ABNORMAL HIGH (ref 0.6–1.1)
Total Bilirubin: 0.56 mg/dL (ref 0.20–1.20)

## 2013-09-25 MED ORDER — SODIUM CHLORIDE 0.9 % IJ SOLN
10.0000 mL | INTRAMUSCULAR | Status: DC | PRN
  Administered 2013-09-25: 10 mL
  Filled 2013-09-25: qty 10

## 2013-09-25 MED ORDER — ACETAMINOPHEN 325 MG PO TABS
ORAL_TABLET | ORAL | Status: AC
  Filled 2013-09-25: qty 2

## 2013-09-25 MED ORDER — ACETAMINOPHEN 325 MG PO TABS
650.0000 mg | ORAL_TABLET | Freq: Once | ORAL | Status: AC
  Administered 2013-09-25: 650 mg via ORAL

## 2013-09-25 MED ORDER — TRASTUZUMAB CHEMO INJECTION 440 MG
6.0000 mg/kg | Freq: Once | INTRAVENOUS | Status: AC
  Administered 2013-09-25: 462 mg via INTRAVENOUS
  Filled 2013-09-25: qty 22

## 2013-09-25 MED ORDER — SODIUM CHLORIDE 0.9 % IV SOLN
Freq: Once | INTRAVENOUS | Status: AC
  Administered 2013-09-25: 10:00:00 via INTRAVENOUS

## 2013-09-25 MED ORDER — HEPARIN SOD (PORK) LOCK FLUSH 100 UNIT/ML IV SOLN
500.0000 [IU] | Freq: Once | INTRAVENOUS | Status: AC | PRN
  Administered 2013-09-25: 500 [IU]
  Filled 2013-09-25: qty 5

## 2013-09-25 NOTE — Telephone Encounter (Signed)
, °

## 2013-09-25 NOTE — Progress Notes (Signed)
Patient ID: AARON BOEH, female   DOB: 1951/07/07, 62 y.o.   MRN: [id number] ID: CHRISY HILLEBRAND   DOB: 30-Oct-1951  MR#: [id number]  FAO#:[id number]  PCP: Mila Palmer GYN: Meredeth Ide SU: Cicero Duck OTHER MD: Arvilla Meres, Amy Swaziland, Rick Cornella   HISTORY OF PRESENT ILLNESS: Shary herself palpated a mass in her right breast 11/09/2012. She brought this to Dr. Harlene Salts attention and mammography was obtained at [facility], (I do not have that report today) confirming a mass in the lower inner quadrant of the right breast. 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  Bilateral breast MRI 11/22/2012 showed a far posterior right breast mass measuring 3.2 cm with no discernible fat plane between the mass and the underlying pectoralis major. There was extension of irregular enhancement into the superficial aspect of the cholesterol is muscle underlying the mass, but there was no evidence for chest wall involvement, internal mammary artery chain or axillary lymph node involvement, or anything else in the breast or the contralateral breast.  The patient's subsequent history is as detailed below  INTERVAL HISTORY: Sheina returns today for followup of her breast cancer. . She has recovered nicely from radiation, with minimal hyperpigmentation remaining. She started tamoxifen mid-September. So far she has minimal hot flashes and no vaginal wetness or other symptoms that she is aware of. REVIEW OF SYSTEMS: She . Is having no breast related symptoms, although she does have significant vaginal dryness problems. She is going to the gym twice a week and walking twice a week. She has an excellent diet. She is going to be doing a lot of traveling worked wise in November, but then will settle down over the holidays and try to  make it 3 times a week to the gym. Overall, a detailed review of systems today was entirely benign.  PAST MEDICAL HISTORY: Past Medical History  Diagnosis Date  . Cancer     breast  . Breast cancer 11/15/12    Right invasive ductal ca  . Neuromuscular disorder     tingling hands and feet from chemo-getting better  . Anemia     s/p chemotherapy  . GERD (gastroesophageal reflux disease)     only during chemotherapy  . Hypertension   . Radiation 06/06/13-07/22/13    Right breast    PAST SURGICAL HISTORY: Past Surgical History  Procedure Laterality Date  . Portacath placement  12/20/2012    Procedure: INSERTION PORT-A-CATH;  Surgeon: Currie Paris, MD;  Location: [facility] OR;  Service: General;  Laterality: N/A;  . Tonsillectomy    . Arm skin lesion biopsy / excision  2011    nodular faciitis lt arm  . Colonoscopy    . Breast lumpectomy with needle localization and axillary sentinel lymph node bx Right 05/07/2013    Procedure: NEEDLE LOCALIZATION RIGHT BREAST LUMPECTOMY AND   SENTINEL LYMPH NODE  ;  Surgeon: Currie Paris, MD;  Location: [facility];  Service: General;  Laterality: Right;    FAMILY HISTORY Family History  Problem Relation Age of Onset  . Breast cancer Mother 85  . Heart disease Father   . Bladder Cancer Father 61  . Breast cancer Maternal Aunt     diagnosed in her 67s  . Multiple sclerosis Paternal Aunt   . Heart disease Maternal Grandmother   .  Mental retardation Cousin     maternal cousins; brothers, sons of aunt with breast cancer   the patient's father immigrated from [place] 1938, living in [place] for about 10 years. He had significant coronary artery disease, bladder cancer, and a ruptured aneurysm, but lived to be 91. The patient's mother is living at age 23. The patient has one brother, no sisters. The patient's mother had breast cancer diagnosed at age 52. The patient is of a Ashkenazi Jewish descent. She will be meeting with our  genetics counselor today  GYNECOLOGIC HISTORY: Menarche age 65, first live birth age 17, she is GX P2, menopause 2006, received hormone replacement until approximately 2010.  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 Her husband [name] has a management position for [employer]. This involves a lot of traveling. Son [name] lives in [place] and is a sports Banker. Daughter [name] lives in [place], where she is a Gaffer in international developments and is also getting an MPH in Development worker, international aid. The patient has no grandchildren.   ADVANCED DIRECTIVES: In place  HEALTH MAINTENANCE: History  Substance Use Topics  . Smoking status: Former Smoker -- 1.00 packs/day for 4 years    Types: Cigarettes    Quit date: 11/30/1974  . Smokeless tobacco: Never Used  . Alcohol Use: Yes     Comment: glass of wine on weekend   Anaiya exercises 3-4 times a week, doing weights, walking, and other structures exercises.   Colonoscopy: 2009/ Medoff  PAP: 2013  Bone density: AUG 2014 shows T -1.9  Lipid panel:  No Known Allergies  Current Outpatient Prescriptions  Medication Sig Dispense Refill  . calcium-vitamin D (OSCAL WITH D) 500-200 MG-UNIT per tablet Take 1 tablet by mouth 2 (two) times daily. 600 vit d      . diphenoxylate-atropine (LOMOTIL) 2.5-0.025 MG per tablet Take 1 tablet by mouth every 4 (four) hours as needed for diarrhea or loose stools (up to 6 per day).  30 tablet  0  . simvastatin (ZOCOR) 40 MG tablet Take 40 mg by mouth daily.      . tamoxifen (NOLVADEX) 20 MG tablet Take 1 tablet (20 mg total) by mouth daily.  90 tablet  4   No current facility-administered medications for this visit.    OBJECTIVE: Middle-aged white woman who looks well Filed Vitals:   09/25/13 0833  BP: 122/81  Pulse: 77  Temp: 98.3 F (36.8 C)  Resp: 18     Body mass index is 29.27 kg/(m^2).    ECOG FS: 0 Filed Weights   09/25/13  0833  Weight: 170 lb 9.6 oz (77.384 kg)   Sclerae unicteric, pupils equal round and reactive to light Oropharynx shows no thrush, dentition is good No cervical or supraclavicular adenopathy Lungs clear to auscultation, good excursion bilaterally Heart regular rate and rhythm, no murmur appreciated Abdomen soft, nontender, positive bowel sounds MSK no focal spinal tenderness, no kyphosis or scoliosis No joint edema, no rash Neuro: nonfocal, well oriented with positive affect Breasts:  The right breast is status post lumpectomy and radiation. There is minimal hyperpigmentation remaining. Full breast exam was deferred today Port is intact in the left upper chest wall     LAB RESULTS: CBC    Component Value Date/Time   WBC 5.0 09/25/2013 0815   RBC 4.52 09/25/2013 0815   HGB 12.3 09/25/2013 0815   HCT 36.7 09/25/2013 0815   PLT 204 09/25/2013 0815  MCV 81.2 09/25/2013 0815   MCH 27.2 09/25/2013 0815   MCHC 33.5 09/25/2013 0815   RDW 16.0* 09/25/2013 0815   LYMPHSABS 1.3 09/25/2013 0815   MONOABS 0.3 09/25/2013 0815   EOSABS 0.2 09/25/2013 0815   BASOSABS 0.0 09/25/2013 0815       Chemistry      Component Value Date/Time   NA 139 08/14/2013 1400   K 3.7 08/14/2013 1400   CL 109* 05/02/2013 1043   CO2 27 08/14/2013 1400   BUN 25.0 08/14/2013 1400   BUN 15 08/23/2010 1350   CREATININE 1.1 08/14/2013 1400   CREATININE 1.00 08/23/2010 1350      Component Value Date/Time   CALCIUM 9.1 08/14/2013 1400        STUDIES: Dg Bone Density  07/17/2013   *RADIOLOGY REPORT*  Clinical Data:  Postmenopausal.  DUAL X-RAY ABSORPTIOMETRY (DXA) FOR BONE MINERAL DENSITY  AP LUMBAR SPINE  Bone Mineral Density (BMD):            0.908 g/cm2 Young Adult T Score:                          -1.3 Z Score:                                                0.2  LEFT FEMUR NECK  Bone Mineral Density (BMD):             0.638 g/cm2 Young Adult T Score:                           -1.9 Z Score:                                                  -0.6  ASSESSMENT:  Patient's diagnostic category is LOW BONE MASS by WHO Criteria.  FRACTURE RISK: MODERATE  FRAX: Based on the World Health Organization FRAX model, the 10 year probability of a major osteoporotic fracture is 8.9%.  The 10 year probability of a hip fracture is 1.0%.  .  COMPARISON: None.  RECOMMENDATIONS:   All patients should ensure an adequate intake of dietary calcium (1200mg  daily) and vitamin D (800 IU daily) unless contraindicated. The National Osteoporosis Foundation recommends that FDA-approved medical therapies be considered in postmenopausal women and mean age 49 or older with a:        1)     Hip or vertebral (clinical or morphometric) fracture.           2)    T-score of -2.5 or lower at the spine or hip. 3)    Ten-year fracture probability by FRAX of 3% or greater for hip fracture or 20% or greater for major osteoporotic fracture. FOLLOW-UP:  People with diagnosed cases of osteoporosis or at high risk for fracture should have regular bone mineral density tests.  For patients eligible for Medicare, routine testing is allowed once every 2 years.  The testing frequency can be increased to one year for patients who have rapidly progressing disease, those who are receiving or discontinuing medical therapy to restore bone mass, or have additional risk factors.  Original Report Authenticated By: Beckie Salts, M.D.   ASSESSMENT: 62 y.o. [place] woman   (1)  s/p right breast biopsy 11/15/2012 for a clinical T2 N0, stage IIA invasive ductal carcinoma, grade 3, with superficial involvement of the pectoralis muscle, estrogen receptor 29% positive, progesterone receptor negative, with an Mib-1 of 79% and HER-2 amplification by CISH with a ratio of 4.39  (2) treated in the neoadjuvant setting, completing 6 cycles of docetaxel/ carboplatin/ trastuzumab 04/11/2013  (3) trastuzumab will be continued for total of one year (through January 2015).-- Most recent echo  06/25/2013 showed a well-preserved ejecton fraction  (4) right lumpectomy and sentinel lymph node sampling 05/07/2013 showed a pathologic complete response  (5) adjuvant radiation completed 07/22/2013  (6) started tamoxifen 08/20/2013  PLAN:  Aleksis is tolerating the tamoxifen so far with no unusual side effects. The plan there is going to be to continue on tamoxifen at least 2 years before considering switching to an aromatase inhibitor.  Accordingly during the next 2 years it will be fine for her to use Vagifem suppositories or Estring. She will discuss this with gynecology at your next visit.  She tells me she's got a perfect cholesterol profile, she is going to the gym 3-4 times a week, and she is planning a cruise to the [place] in January. She will be finished with her trastuzumab treatments early January 2015. She does need an echocardiogram sometime this month--this should have another October but for some reason it wasn't done.  Asami is doing fantastic from every point of view. She is keeping a block, which is helping quite a few people not only keep up with her but keep up your spirits. She knows to call for any problems that may develop before her next visit here, which will be with her last trastuzumab the os.   MAGRINAT,GUSTAV C    09/25/2013

## 2013-09-25 NOTE — Telephone Encounter (Signed)
Patient called and left voicemail message regarding her appts. She wants to reschedule her 12/31 and 1/21 appts. I have forwarded the message to the desk RN. I have called and spoke with the patient,

## 2013-09-25 NOTE — Patient Instructions (Signed)
[facility] Discharge Instructions for Patients Receiving Chemotherapy  Today you received the following biotherapy agent: Herceptin   To help prevent nausea and vomiting after your treatment, we encourage you to take your nausea medication as prescribed.    If you develop nausea and vomiting that is not controlled by your nausea medication, call the clinic.   BELOW ARE SYMPTOMS THAT SHOULD BE REPORTED IMMEDIATELY:  *FEVER GREATER THAN 100.5 F  *CHILLS WITH OR WITHOUT FEVER  NAUSEA AND VOMITING THAT IS NOT CONTROLLED WITH YOUR NAUSEA MEDICATION  *UNUSUAL SHORTNESS OF BREATH  *UNUSUAL BRUISING OR BLEEDING  TENDERNESS IN MOUTH AND THROAT WITH OR WITHOUT PRESENCE OF ULCERS  *URINARY PROBLEMS  *BOWEL PROBLEMS  UNUSUAL RASH Items with * indicate a potential emergency and should be followed up as soon as possible.  Feel free to call the clinic you have any questions or concerns. The clinic phone number is [phone].    

## 2013-09-25 NOTE — Telephone Encounter (Signed)
Per staff message and POF I have scheduled appts.  JMW  

## 2013-09-27 ENCOUNTER — Other Ambulatory Visit: Payer: 59 | Admitting: Lab

## 2013-09-30 ENCOUNTER — Telehealth: Payer: Self-pay | Admitting: *Deleted

## 2013-09-30 NOTE — Telephone Encounter (Signed)
This RN received message from pt stating need to reschedule 12/31 and 1/21 appts. She states she was told by Dr Thea Silversmith she could reschedule these due to her plans at [place]. She was told by scheduling the MD would have to place request.  This RN place POF with above request. Called and obtained identified VM for pt.  Message left per above actions by this RN to accommodate pt's schedule.

## 2013-10-02 ENCOUNTER — Telehealth: Payer: Self-pay | Admitting: *Deleted

## 2013-10-02 ENCOUNTER — Telehealth: Payer: Self-pay | Admitting: Oncology

## 2013-10-02 NOTE — Telephone Encounter (Signed)
Per staff message and POF I have scheduled appts.  JMW  

## 2013-10-03 ENCOUNTER — Other Ambulatory Visit: Payer: Self-pay

## 2013-10-08 ENCOUNTER — Ambulatory Visit (HOSPITAL_COMMUNITY)
Admission: RE | Admit: 2013-10-08 | Discharge: 2013-10-08 | Disposition: A | Discharge status: Home / Self Care | Payer: 59 | Source: Ambulatory Visit | Attending: Internal Medicine | Admitting: Internal Medicine

## 2013-10-08 ENCOUNTER — Ambulatory Visit (HOSPITAL_BASED_OUTPATIENT_CLINIC_OR_DEPARTMENT_OTHER)
Admission: RE | Admit: 2013-10-08 | Discharge: 2013-10-08 | Disposition: A | Discharge status: Home / Self Care | Payer: 59 | Source: Ambulatory Visit | Attending: Radiation Oncology | Admitting: Radiation Oncology

## 2013-10-08 ENCOUNTER — Encounter (HOSPITAL_COMMUNITY): Payer: Self-pay

## 2013-10-08 VITALS — BP 126/80 | HR 79 | Wt 171.0 lb

## 2013-10-08 DIAGNOSIS — C50511 Malignant neoplasm of lower-outer quadrant of right female breast: Secondary | ICD-10-CM

## 2013-10-08 DIAGNOSIS — C50519 Malignant neoplasm of lower-outer quadrant of unspecified female breast: Secondary | ICD-10-CM | POA: Insufficient documentation

## 2013-10-08 DIAGNOSIS — I517 Cardiomegaly: Secondary | ICD-10-CM

## 2013-10-08 DIAGNOSIS — C50911 Malignant neoplasm of unspecified site of right female breast: Secondary | ICD-10-CM

## 2013-10-08 NOTE — Progress Notes (Signed)
Patient ID: Anita Randolph, female   DOB: Jul 19, 1951, 62 y.o.   MRN: [id number]  PCP: Anita Randolph  GYN: Anita Randolph  General Surgeon: Anita Randolph   HPI: Anita Randolph is a 62 year with R breast cancer diagnosed in 10/2012-invasive ductal carcinoma, grade 3, 29% estrogen receptor positive, progesterone receptor negative, hyperlipidemia and no known heart disease referred to the cardio-onc clinic by Anita Randolph  She finished neoadjuvant docetaxel/carboplatin/trastuzumab  6/6 cycles on Apr 11, 2013. Underwent R lumpectomy and lymph node resection (all clear) on 05/07/13. Completed XRT.  She returns for follow up.  Denies SOB/PND/Orthopnea. Walking 2-3 times a week. She currently works full time at [employer] for [employer].   ECHO 12/13/12 EF 60-65% Lateral s' 7.9 ECHO 03/25/13 EF 55-60% Lateral s' 9.5 ECHO 06/16/13 EF 60% Lateral s' 7.9 (poor window) overall strain -17.7 ECHO 10/08/13 EF 55-60% Lateral s' 9.78 Global Strain poor window   PHYSICAL EXAM: Filed Vitals:   10/08/13 1531  BP: 126/80  Pulse: 79   General:  Well appearing. No respiratory difficulty HEENT: normal Neck: supple. no JVD. Carotids 2+ bilat; no bruits. No lymphadenopathy or thryomegaly appreciated. Cor: PMI nondisplaced. Regular rate & rhythm. No rubs, gallops or murmurs. Lungs: clear Abdomen: soft, nontender, nondistended. No hepatosplenomegaly. No bruits or masses. Good bowel sounds. Extremities: no cyanosis, clubbing, rash, edema Neuro: alert & oriented x 3, cranial nerves grossly intact. moves all 4 extremities w/o difficulty. Affect pleasant.   ASSESSMENT/PLAN: 1. Breast CA -On Herceptin with expected completion December 23, 2013. Anita Randolph discussed and reviewed ECHO.  EF and Doppler parameters stable. No HF on exam. Follow up 3 months with an ECHO in February.   2. Extremity edema -Resolved.   Follow up in February for post herceptin ECHO.   Anita Randolph 4:01 PM  Patient seen with Anita Randolph, I agree with  the above note.  Echo reviewed, EF normal and lateral S' stable.  Patient will continue Herceptin until completion in 1/15.  Will get echo and followup in 3 months.  If echo looks ok, no more followup needed.   Anita Randolph

## 2013-10-08 NOTE — Patient Instructions (Signed)
Follow in February for post Herceptin ECHO

## 2013-10-17 ENCOUNTER — Other Ambulatory Visit (HOSPITAL_BASED_OUTPATIENT_CLINIC_OR_DEPARTMENT_OTHER): Payer: 59 | Admitting: Lab

## 2013-10-17 ENCOUNTER — Ambulatory Visit (HOSPITAL_BASED_OUTPATIENT_CLINIC_OR_DEPARTMENT_OTHER): Payer: 59

## 2013-10-17 VITALS — BP 130/85 | HR 65 | Temp 97.1°F | Resp 16

## 2013-10-17 DIAGNOSIS — Z5112 Encounter for antineoplastic immunotherapy: Secondary | ICD-10-CM

## 2013-10-17 DIAGNOSIS — C50911 Malignant neoplasm of unspecified site of right female breast: Secondary | ICD-10-CM

## 2013-10-17 DIAGNOSIS — C50519 Malignant neoplasm of lower-outer quadrant of unspecified female breast: Secondary | ICD-10-CM

## 2013-10-17 LAB — CBC WITH DIFFERENTIAL/PLATELET
Basophils Absolute: 0 10*3/uL (ref 0.0–0.1)
EOS%: 3.4 % (ref 0.0–7.0)
HCT: 37.5 % (ref 34.8–46.6)
HGB: 12.6 g/dL (ref 11.6–15.9)
MCH: 27.8 pg (ref 25.1–34.0)
MCV: 82.8 fL (ref 79.5–101.0)
MONO%: 6.1 % (ref 0.0–14.0)
NEUT%: 68.4 % (ref 38.4–76.8)
Platelets: 218 10*3/uL (ref 145–400)

## 2013-10-17 MED ORDER — SODIUM CHLORIDE 0.9 % IJ SOLN
10.0000 mL | INTRAMUSCULAR | Status: DC | PRN
  Administered 2013-10-17: 10 mL
  Filled 2013-10-17: qty 10

## 2013-10-17 MED ORDER — TRASTUZUMAB CHEMO INJECTION 440 MG
6.0000 mg/kg | Freq: Once | INTRAVENOUS | Status: AC
  Administered 2013-10-17: 462 mg via INTRAVENOUS
  Filled 2013-10-17: qty 22

## 2013-10-17 MED ORDER — ACETAMINOPHEN 325 MG PO TABS
650.0000 mg | ORAL_TABLET | Freq: Once | ORAL | Status: AC
  Administered 2013-10-17: 650 mg via ORAL

## 2013-10-17 MED ORDER — HEPARIN SOD (PORK) LOCK FLUSH 100 UNIT/ML IV SOLN
500.0000 [IU] | Freq: Once | INTRAVENOUS | Status: AC | PRN
  Administered 2013-10-17: 500 [IU]
  Filled 2013-10-17: qty 5

## 2013-10-17 MED ORDER — ACETAMINOPHEN 325 MG PO TABS
ORAL_TABLET | ORAL | Status: AC
  Filled 2013-10-17: qty 2

## 2013-10-17 MED ORDER — SODIUM CHLORIDE 0.9 % IV SOLN
Freq: Once | INTRAVENOUS | Status: AC
  Administered 2013-10-17: 09:00:00 via INTRAVENOUS

## 2013-10-17 NOTE — Patient Instructions (Signed)
[facility] Discharge Instructions for Patients Receiving Chemotherapy  Today you received the following chemotherapy agents Herceptin.  To help prevent nausea and vomiting after your treatment, we encourage you to take your nausea medication as prescribed.   If you develop nausea and vomiting that is not controlled by your nausea medication, call the clinic.   BELOW ARE SYMPTOMS THAT SHOULD BE REPORTED IMMEDIATELY:  *FEVER GREATER THAN 100.5 F  *CHILLS WITH OR WITHOUT FEVER  NAUSEA AND VOMITING THAT IS NOT CONTROLLED WITH YOUR NAUSEA MEDICATION  *UNUSUAL SHORTNESS OF BREATH  *UNUSUAL BRUISING OR BLEEDING  TENDERNESS IN MOUTH AND THROAT WITH OR WITHOUT PRESENCE OF ULCERS  *URINARY PROBLEMS  *BOWEL PROBLEMS  UNUSUAL RASH Items with * indicate a potential emergency and should be followed up as soon as possible.  Feel free to call the clinic you have any questions or concerns. The clinic phone number is [phone].    

## 2013-10-25 ENCOUNTER — Other Ambulatory Visit: Payer: 59 | Admitting: Lab

## 2013-10-30 ENCOUNTER — Ambulatory Visit (INDEPENDENT_AMBULATORY_CARE_PROVIDER_SITE_OTHER): Payer: 59 | Admitting: Obstetrics and Gynecology

## 2013-10-30 ENCOUNTER — Encounter: Payer: Self-pay | Admitting: Obstetrics and Gynecology

## 2013-10-30 ENCOUNTER — Ambulatory Visit: Payer: Self-pay | Admitting: Obstetrics and Gynecology

## 2013-10-30 VITALS — BP 120/76 | HR 60 | Ht 63.0 in | Wt 170.0 lb

## 2013-10-30 DIAGNOSIS — Z01419 Encounter for gynecological examination (general) (routine) without abnormal findings: Secondary | ICD-10-CM

## 2013-10-30 NOTE — Patient Instructions (Signed)

## 2013-10-30 NOTE — Progress Notes (Signed)
Patient ID: Anita Randolph, female   DOB: [birth year], 62 y.o.   MRN: [id number] GYNECOLOGY VISIT  PCP:   Mila Palmer, MD  Referring provider:   HPI: 62 y.o.   Married  Caucasian  female   G2P2002 with Patient's last menstrual period was 11/28/2004.   here for   AEX.  Status pot chemotherapy, radiation therapy and lumpectomy for right invasive breast cancer.  BRCA negative status.  Finishing treatment at the end of January.  On Tamoxifen now.  No significant side effects. No bleeding or spotting.  No significant hot flashes.  Sleeping well. No sexual difficulty.  Uses Vagisil product  for lubrication.   History of fibroid uterus. Largest fibroid 5 x 5 x 5 cm 2007.   Hgb:    PCP Urine:  PCP  GYNECOLOGIC HISTORY: Patient's last menstrual period was 11/28/2004. Sexually active:  yes Partner preference: female Contraception:   postmenopausal Menopausal hormone therapy: no DES exposure:   no Blood transfusions:   no Sexually transmitted diseases:   no GYN Procedures:  Right breast lumpectomy 04/2013 Mammogram:  11-15-12 with U/S and detected mass in right breast.  Patient had MRI of breast 11-22-12 and diagnosed with invasive ductal carcinoma.  Had chemotherapy and then right lumpectomy in 04/2013.            Pap:   10-15-12 wnl:neg HR HPV History of abnormal pap smear:  no   OB History   Grav Para Term Preterm Abortions TAB SAB Ect Mult Living   2 2 2       2        LIFESTYLE: Exercise:     Cardio and weights 3x a week          Tobacco:     no Alcohol:       1-2 glasses of wine per week Drug use:    no  OTHER HEALTH MAINTENANCE: Tetanus/TDap:   2011 Gardisil:              n/a Influenza:            08/2013 Zostavax:            Never, waiting until around May 2015.   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 Darnelle Catalan.  Colonoscopy:     2009 with Dr. Medoff:polyps.  Colonoscopy now due but patient scheduled to repeat In March due to recovering from  breast cancer.  Cholesterol check:  08/2013 wnl with medication: PCP  Family History  Problem Relation Age of Onset  . Breast cancer Mother 70  . Osteoporosis Mother   . Diabetes Mother   . Thyroid disease Mother   . Heart disease Father   . Bladder Cancer Father 16  . Breast cancer Maternal Aunt     diagnosed in her 74s  . Multiple sclerosis Paternal Aunt   . Heart disease Maternal Grandmother   . Diabetes Maternal Grandmother   . Mental retardation Cousin     maternal cousins; brothers, sons of aunt with breast cancer    Patient Active Problem List   Diagnosis Date Noted  . Breast cancer of lower-outer quadrant of right female breast 08/14/2013  . Edema, lower extremity 06/25/2013  . GERD (gastroesophageal reflux disease) 01/02/2013   Past Medical History  Diagnosis Date  . Cancer     breast  . Breast cancer 11/15/12    Right invasive ductal ca  . Neuromuscular disorder     tingling hands  and feet from chemo-getting better  . GERD (gastroesophageal reflux disease)     only during chemotherapy  . Hypertension   . Radiation 06/06/13-07/22/13    Right breast  . Dysplastic nevus 11/2009    -w/atypia right labia majora  . Fibroid 2007    largest 5x5x5cm  . Microscopic hematuria     negative w/u  . Anemia     s/p chemotherapy  . Osteopenia due to cancer therapy     Past Surgical History  Procedure Laterality Date  . Portacath placement  12/20/2012    Procedure: INSERTION PORT-A-CATH;  Surgeon: Currie Paris, MD;  Location: [facility] OR;  Service: General;  Laterality: N/A;  . Tonsillectomy    . Arm skin lesion biopsy / excision  2011    nodular faciitis lt arm  . Colonoscopy    . Breast lumpectomy with needle localization and axillary sentinel lymph node bx Right 05/07/2013    Procedure: NEEDLE LOCALIZATION RIGHT BREAST LUMPECTOMY AND   SENTINEL LYMPH NODE  ;  Surgeon: Currie Paris, MD;  Location: [facility];  Service: General;  Laterality: Right;   . Breast surgery    . Lipoma excision      -right back(age 62)    ALLERGIES: Review of patient's allergies indicates no known allergies.  Current Outpatient Prescriptions  Medication Sig Dispense Refill  . calcium-vitamin D (OSCAL WITH D) 500-200 MG-UNIT per tablet Take 1 tablet by mouth 2 (two) times daily. 600 vit d      . simvastatin (ZOCOR) 20 MG tablet Take 20 mg by mouth daily.      . tamoxifen (NOLVADEX) 20 MG tablet Take 1 tablet (20 mg total) by mouth daily.  90 tablet  4   No current facility-administered medications for this visit.     ROS:  Pertinent items are noted in HPI.  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   PHYSICAL EXAMINATION:    BP 120/76  Pulse 60  Ht 5\' 3"  (1.6 m)  Wt 170 lb (77.111 kg)  BMI 30.12 kg/m2  LMP 11/28/2004   Wt Readings from Last 3 Encounters:  10/30/13 170 lb (77.111 kg)  10/08/13 171 lb (77.565 kg)  09/25/13 170 lb 9.6 oz (77.384 kg)     Ht Readings from Last 3 Encounters:  10/30/13 5\' 3"  (1.6 m)  09/25/13 5\' 4"  (1.626 m)  08/28/13 5\' 4"  (1.626 m)    General appearance: alert, cooperative and appears stated age Head: Normocephalic, without obvious abnormality, atraumatic Neck: no adenopathy, supple, symmetrical, trachea midline and thyroid not enlarged, symmetric, no tenderness/mass/nodules Lungs: clear to auscultation bilaterally Breasts: Patient declined examination.  Heart: regular rate and rhythm Abdomen: soft, non-tender; no masses,  no organomegaly Extremities: extremities normal, atraumatic, no cyanosis or edema Skin: Skin color, texture, turgor normal. No rashes or lesions Lymph nodes: Cervical, supraclavicular, and axillary nodes normal. No abnormal inguinal nodes palpated Neurologic: Grossly normal  Pelvic: External genitalia:  no lesions              Urethra:  normal appearing urethra with no masses, tenderness or lesions              Bartholins and Skenes: normal                  Vagina: normal appearing vagina with normal color and discharge, no lesions              Cervix: normal appearance  Pap and high risk HPV testing done: no.            Bimanual Exam:  Uterus:  uterus is normal size, shape, consistency and nontender                                      Adnexa: normal adnexa in size, nontender and no masses                                      Rectovaginal: Confirms                                      Anus:  normal sphincter tone, no lesions  ASSESSMENT  Normal gynecologic exam. Invasive right breast cancer.  Status post XRT and lumpectomy.  Doing chemotherapy currently.  On Tamoxifen.  History of fibroid uterus.   PLAN  Mammogram per Dr. Darnelle Catalan.  Pap smear and high risk HPV testing due in 2018.  Counseled on  Osteoporosis prevention.  Will call if has any vaginal bleeding or spotting.  Return annually or prn   An After Visit Summary was printed and given to the patient.

## 2013-11-01 ENCOUNTER — Telehealth: Payer: Self-pay | Admitting: *Deleted

## 2013-11-01 NOTE — Telephone Encounter (Signed)
Pt called to this RN to inquire " what can I take for a cold while on tamoxifen and will any OTC medications interfere with my getting herceptin next week?"  Per discussion pt states she has mild cold symptoms and is currently taking sudafed. This RN informed her above is ok to take with the tamoxifen as well as the herceptin. Discussed use of clariten and muccinex if needed.  Anita Randolph also states area on left mid back area that is " very itchy" without a rash. Above occurred approximately 5 days ago.  Discussed above related to skin changes since chemo and use of tamoxifen. Per conversation this RN discussed use of lotions and other non medical interventions.  No other issues at present.

## 2013-11-06 ENCOUNTER — Ambulatory Visit (HOSPITAL_BASED_OUTPATIENT_CLINIC_OR_DEPARTMENT_OTHER): Payer: 59

## 2013-11-06 ENCOUNTER — Other Ambulatory Visit (HOSPITAL_BASED_OUTPATIENT_CLINIC_OR_DEPARTMENT_OTHER): Payer: 59

## 2013-11-06 VITALS — BP 128/69 | HR 78 | Temp 98.6°F | Resp 16

## 2013-11-06 DIAGNOSIS — C50519 Malignant neoplasm of lower-outer quadrant of unspecified female breast: Secondary | ICD-10-CM

## 2013-11-06 DIAGNOSIS — C50911 Malignant neoplasm of unspecified site of right female breast: Secondary | ICD-10-CM

## 2013-11-06 DIAGNOSIS — Z5112 Encounter for antineoplastic immunotherapy: Secondary | ICD-10-CM

## 2013-11-06 LAB — COMPREHENSIVE METABOLIC PANEL (CC13)
Anion Gap: 11 mEq/L (ref 3–11)
BUN: 21.4 mg/dL (ref 7.0–26.0)
CO2: 24 mEq/L (ref 22–29)
Calcium: 9.6 mg/dL (ref 8.4–10.4)
Chloride: 108 mEq/L (ref 98–109)
Creatinine: 1.1 mg/dL (ref 0.6–1.1)
Glucose: 93 mg/dl (ref 70–140)
Sodium: 143 mEq/L (ref 136–145)
Total Bilirubin: 0.36 mg/dL (ref 0.20–1.20)
Total Protein: 6.6 g/dL (ref 6.4–8.3)

## 2013-11-06 LAB — CBC WITH DIFFERENTIAL/PLATELET
Eosinophils Absolute: 0.2 10*3/uL (ref 0.0–0.5)
HCT: 36.3 % (ref 34.8–46.6)
HGB: 12.2 g/dL (ref 11.6–15.9)
LYMPH%: 22.4 % (ref 14.0–49.7)
MCHC: 33.6 g/dL (ref 31.5–36.0)
MCV: 83.3 fL (ref 79.5–101.0)
MONO#: 0.4 10*3/uL (ref 0.1–0.9)
NEUT#: 4 10*3/uL (ref 1.5–6.5)
NEUT%: 67.3 % (ref 38.4–76.8)
Platelets: 256 10*3/uL (ref 145–400)
RDW: 14.7 % — ABNORMAL HIGH (ref 11.2–14.5)
WBC: 6 10*3/uL (ref 3.9–10.3)

## 2013-11-06 MED ORDER — HEPARIN SOD (PORK) LOCK FLUSH 100 UNIT/ML IV SOLN
500.0000 [IU] | Freq: Once | INTRAVENOUS | Status: AC | PRN
  Administered 2013-11-06: 500 [IU]
  Filled 2013-11-06: qty 5

## 2013-11-06 MED ORDER — SODIUM CHLORIDE 0.9 % IV SOLN
Freq: Once | INTRAVENOUS | Status: AC
  Administered 2013-11-06: 09:00:00 via INTRAVENOUS

## 2013-11-06 MED ORDER — ACETAMINOPHEN 325 MG PO TABS
ORAL_TABLET | ORAL | Status: AC
  Filled 2013-11-06: qty 2

## 2013-11-06 MED ORDER — ACETAMINOPHEN 325 MG PO TABS
650.0000 mg | ORAL_TABLET | Freq: Once | ORAL | Status: AC
  Administered 2013-11-06: 650 mg via ORAL

## 2013-11-06 MED ORDER — SODIUM CHLORIDE 0.9 % IJ SOLN
10.0000 mL | INTRAMUSCULAR | Status: DC | PRN
  Administered 2013-11-06: 10 mL
  Filled 2013-11-06: qty 10

## 2013-11-06 MED ORDER — SODIUM CHLORIDE 0.9 % IV SOLN
6.0000 mg/kg | Freq: Once | INTRAVENOUS | Status: AC
  Administered 2013-11-06: 462 mg via INTRAVENOUS
  Filled 2013-11-06: qty 22

## 2013-11-06 NOTE — Patient Instructions (Signed)
[facility] Discharge Instructions for Patients Receiving Chemotherapy  Today you received the following chemotherapy agents Herceptin.  To help prevent nausea and vomiting after your treatment, we encourage you to take your nausea medication as prescribed.    If you develop nausea and vomiting that is not controlled by your nausea medication, call the clinic.   BELOW ARE SYMPTOMS THAT SHOULD BE REPORTED IMMEDIATELY:  *FEVER GREATER THAN 100.5 F  *CHILLS WITH OR WITHOUT FEVER  NAUSEA AND VOMITING THAT IS NOT CONTROLLED WITH YOUR NAUSEA MEDICATION  *UNUSUAL SHORTNESS OF BREATH  *UNUSUAL BRUISING OR BLEEDING  TENDERNESS IN MOUTH AND THROAT WITH OR WITHOUT PRESENCE OF ULCERS  *URINARY PROBLEMS  *BOWEL PROBLEMS  UNUSUAL RASH Items with * indicate a potential emergency and should be followed up as soon as possible.  Feel free to call the clinic should you have any questions or concerns. The clinic phone number is [phone].  It was a pleasure to serve you today!

## 2013-11-16 ENCOUNTER — Encounter: Payer: Self-pay | Admitting: Oncology

## 2013-11-19 ENCOUNTER — Other Ambulatory Visit: Payer: Self-pay | Admitting: *Deleted

## 2013-11-22 ENCOUNTER — Other Ambulatory Visit: Payer: 59 | Admitting: Lab

## 2013-11-27 ENCOUNTER — Other Ambulatory Visit: Payer: 59 | Admitting: Lab

## 2013-11-27 ENCOUNTER — Ambulatory Visit: Payer: 59

## 2013-12-02 ENCOUNTER — Other Ambulatory Visit (HOSPITAL_BASED_OUTPATIENT_CLINIC_OR_DEPARTMENT_OTHER): Payer: 59

## 2013-12-02 ENCOUNTER — Telehealth: Payer: Self-pay | Admitting: *Deleted

## 2013-12-02 ENCOUNTER — Ambulatory Visit (HOSPITAL_BASED_OUTPATIENT_CLINIC_OR_DEPARTMENT_OTHER): Payer: 59

## 2013-12-02 VITALS — BP 112/72 | HR 79 | Temp 98.0°F | Resp 16

## 2013-12-02 DIAGNOSIS — C50519 Malignant neoplasm of lower-outer quadrant of unspecified female breast: Secondary | ICD-10-CM

## 2013-12-02 DIAGNOSIS — C50911 Malignant neoplasm of unspecified site of right female breast: Secondary | ICD-10-CM

## 2013-12-02 DIAGNOSIS — Z5112 Encounter for antineoplastic immunotherapy: Secondary | ICD-10-CM

## 2013-12-02 LAB — CBC WITH DIFFERENTIAL/PLATELET
BASO%: 0.8 % (ref 0.0–2.0)
BASOS ABS: 0.1 10*3/uL (ref 0.0–0.1)
EOS ABS: 0.2 10*3/uL (ref 0.0–0.5)
EOS%: 2.5 % (ref 0.0–7.0)
HEMATOCRIT: 38.5 % (ref 34.8–46.6)
HEMOGLOBIN: 13 g/dL (ref 11.6–15.9)
LYMPH#: 1.4 10*3/uL (ref 0.9–3.3)
LYMPH%: 23.6 % (ref 14.0–49.7)
MCH: 28.3 pg (ref 25.1–34.0)
MCHC: 33.8 g/dL (ref 31.5–36.0)
MCV: 83.9 fL (ref 79.5–101.0)
MONO#: 0.3 10*3/uL (ref 0.1–0.9)
MONO%: 5.4 % (ref 0.0–14.0)
NEUT%: 67.7 % (ref 38.4–76.8)
NEUTROS ABS: 4 10*3/uL (ref 1.5–6.5)
Platelets: 224 10*3/uL (ref 145–400)
RBC: 4.59 10*6/uL (ref 3.70–5.45)
RDW: 15 % — AB (ref 11.2–14.5)
WBC: 6 10*3/uL (ref 3.9–10.3)
nRBC: 0 % (ref 0–0)

## 2013-12-02 MED ORDER — ACETAMINOPHEN 325 MG PO TABS
650.0000 mg | ORAL_TABLET | Freq: Once | ORAL | Status: AC
  Administered 2013-12-02: 650 mg via ORAL

## 2013-12-02 MED ORDER — SODIUM CHLORIDE 0.9 % IV SOLN
Freq: Once | INTRAVENOUS | Status: AC
  Administered 2013-12-02: 09:00:00 via INTRAVENOUS

## 2013-12-02 MED ORDER — SODIUM CHLORIDE 0.9 % IJ SOLN
10.0000 mL | INTRAMUSCULAR | Status: DC | PRN
  Administered 2013-12-02: 10 mL
  Filled 2013-12-02: qty 10

## 2013-12-02 MED ORDER — HEPARIN SOD (PORK) LOCK FLUSH 100 UNIT/ML IV SOLN
500.0000 [IU] | Freq: Once | INTRAVENOUS | Status: AC | PRN
  Administered 2013-12-02: 500 [IU]
  Filled 2013-12-02: qty 5

## 2013-12-02 MED ORDER — TRASTUZUMAB CHEMO INJECTION 440 MG
6.0000 mg/kg | Freq: Once | INTRAVENOUS | Status: AC
  Administered 2013-12-02: 462 mg via INTRAVENOUS
  Filled 2013-12-02: qty 22

## 2013-12-02 MED ORDER — ACETAMINOPHEN 325 MG PO TABS
ORAL_TABLET | ORAL | Status: AC
  Filled 2013-12-02: qty 2

## 2013-12-02 NOTE — Patient Instructions (Signed)
[facility] Discharge Instructions for Patients Receiving Chemotherapy  Today you received the following chemotherapy agents Herceptin.  To help prevent nausea and vomiting after your treatment, we encourage you to take your nausea medication as prescribed.   If you develop nausea and vomiting that is not controlled by your nausea medication, call the clinic.   BELOW ARE SYMPTOMS THAT SHOULD BE REPORTED IMMEDIATELY:  *FEVER GREATER THAN 100.5 F  *CHILLS WITH OR WITHOUT FEVER  NAUSEA AND VOMITING THAT IS NOT CONTROLLED WITH YOUR NAUSEA MEDICATION  *UNUSUAL SHORTNESS OF BREATH  *UNUSUAL BRUISING OR BLEEDING  TENDERNESS IN MOUTH AND THROAT WITH OR WITHOUT PRESENCE OF ULCERS  *URINARY PROBLEMS  *BOWEL PROBLEMS  UNUSUAL RASH Items with * indicate a potential emergency and should be followed up as soon as possible.  Feel free to call the clinic you have any questions or concerns. The clinic phone number is [phone].    

## 2013-12-02 NOTE — Telephone Encounter (Signed)
Per chemo RN I have moved 1/26 appts to before MD cvisit

## 2013-12-18 ENCOUNTER — Other Ambulatory Visit: Payer: 59 | Admitting: Lab

## 2013-12-18 ENCOUNTER — Ambulatory Visit: Payer: 59

## 2013-12-18 ENCOUNTER — Ambulatory Visit: Payer: 59 | Admitting: Oncology

## 2013-12-19 ENCOUNTER — Other Ambulatory Visit: Payer: 59 | Admitting: Lab

## 2013-12-23 ENCOUNTER — Other Ambulatory Visit (HOSPITAL_BASED_OUTPATIENT_CLINIC_OR_DEPARTMENT_OTHER): Payer: 59

## 2013-12-23 ENCOUNTER — Telehealth: Payer: Self-pay | Admitting: Oncology

## 2013-12-23 ENCOUNTER — Ambulatory Visit (HOSPITAL_BASED_OUTPATIENT_CLINIC_OR_DEPARTMENT_OTHER): Payer: 59 | Admitting: Oncology

## 2013-12-23 ENCOUNTER — Ambulatory Visit (HOSPITAL_BASED_OUTPATIENT_CLINIC_OR_DEPARTMENT_OTHER): Payer: 59

## 2013-12-23 VITALS — BP 127/63 | HR 84 | Temp 97.9°F | Resp 18 | Ht 63.0 in | Wt 170.5 lb

## 2013-12-23 VITALS — BP 130/77 | HR 84 | Temp 97.7°F | Resp 17

## 2013-12-23 DIAGNOSIS — M899 Disorder of bone, unspecified: Secondary | ICD-10-CM

## 2013-12-23 DIAGNOSIS — C50319 Malignant neoplasm of lower-inner quadrant of unspecified female breast: Secondary | ICD-10-CM

## 2013-12-23 DIAGNOSIS — Z5112 Encounter for antineoplastic immunotherapy: Secondary | ICD-10-CM

## 2013-12-23 DIAGNOSIS — C50511 Malignant neoplasm of lower-outer quadrant of right female breast: Secondary | ICD-10-CM

## 2013-12-23 DIAGNOSIS — M8580 Other specified disorders of bone density and structure, unspecified site: Secondary | ICD-10-CM

## 2013-12-23 DIAGNOSIS — Z170 Estrogen receptor positive status [ER+]: Secondary | ICD-10-CM

## 2013-12-23 DIAGNOSIS — C50911 Malignant neoplasm of unspecified site of right female breast: Secondary | ICD-10-CM

## 2013-12-23 DIAGNOSIS — M949 Disorder of cartilage, unspecified: Secondary | ICD-10-CM

## 2013-12-23 LAB — COMPREHENSIVE METABOLIC PANEL (CC13)
ALT: 23 U/L (ref 0–55)
AST: 27 U/L (ref 5–34)
Albumin: 3.6 g/dL (ref 3.5–5.0)
Alkaline Phosphatase: 58 U/L (ref 40–150)
Anion Gap: 10 mEq/L (ref 3–11)
BUN: 24.7 mg/dL (ref 7.0–26.0)
CHLORIDE: 107 meq/L (ref 98–109)
CO2: 24 mEq/L (ref 22–29)
CREATININE: 1.2 mg/dL — AB (ref 0.6–1.1)
Calcium: 9.2 mg/dL (ref 8.4–10.4)
Glucose: 106 mg/dl (ref 70–140)
Potassium: 3.8 mEq/L (ref 3.5–5.1)
SODIUM: 141 meq/L (ref 136–145)
TOTAL PROTEIN: 6.6 g/dL (ref 6.4–8.3)
Total Bilirubin: 0.5 mg/dL (ref 0.20–1.20)

## 2013-12-23 LAB — CBC WITH DIFFERENTIAL/PLATELET
BASO%: 0.5 % (ref 0.0–2.0)
Basophils Absolute: 0 10*3/uL (ref 0.0–0.1)
EOS ABS: 0.1 10*3/uL (ref 0.0–0.5)
EOS%: 1.8 % (ref 0.0–7.0)
HCT: 37.3 % (ref 34.8–46.6)
HGB: 12.7 g/dL (ref 11.6–15.9)
LYMPH#: 1.3 10*3/uL (ref 0.9–3.3)
LYMPH%: 21 % (ref 14.0–49.7)
MCH: 28.4 pg (ref 25.1–34.0)
MCHC: 34 g/dL (ref 31.5–36.0)
MCV: 83.4 fL (ref 79.5–101.0)
MONO#: 0.3 10*3/uL (ref 0.1–0.9)
MONO%: 4.7 % (ref 0.0–14.0)
NEUT%: 72 % (ref 38.4–76.8)
NEUTROS ABS: 4.3 10*3/uL (ref 1.5–6.5)
Platelets: 232 10*3/uL (ref 145–400)
RBC: 4.47 10*6/uL (ref 3.70–5.45)
RDW: 14.5 % (ref 11.2–14.5)
WBC: 6 10*3/uL (ref 3.9–10.3)
nRBC: 0 % (ref 0–0)

## 2013-12-23 MED ORDER — ACETAMINOPHEN 325 MG PO TABS
ORAL_TABLET | ORAL | Status: AC
Start: 2013-12-23 — End: 2013-12-23
  Filled 2013-12-23: qty 2

## 2013-12-23 MED ORDER — ACETAMINOPHEN 325 MG PO TABS
650.0000 mg | ORAL_TABLET | Freq: Once | ORAL | Status: AC
  Administered 2013-12-23: 650 mg via ORAL

## 2013-12-23 MED ORDER — HEPARIN SOD (PORK) LOCK FLUSH 100 UNIT/ML IV SOLN
500.0000 [IU] | Freq: Once | INTRAVENOUS | Status: AC | PRN
  Administered 2013-12-23: 500 [IU]
  Filled 2013-12-23: qty 5

## 2013-12-23 MED ORDER — SODIUM CHLORIDE 0.9 % IJ SOLN
10.0000 mL | INTRAMUSCULAR | Status: DC | PRN
  Administered 2013-12-23: 10 mL
  Filled 2013-12-23: qty 10

## 2013-12-23 MED ORDER — SODIUM CHLORIDE 0.9 % IV SOLN
Freq: Once | INTRAVENOUS | Status: AC
  Administered 2013-12-23: 09:00:00 via INTRAVENOUS

## 2013-12-23 MED ORDER — TRASTUZUMAB CHEMO INJECTION 440 MG
6.0000 mg/kg | Freq: Once | INTRAVENOUS | Status: AC
  Administered 2013-12-23: 462 mg via INTRAVENOUS
  Filled 2013-12-23: qty 22

## 2013-12-23 NOTE — Patient Instructions (Signed)
[place] Discharge Instructions for Patients Receiving Chemotherapy  Today you received the following chemotherapy agents Herceptin.   To help prevent nausea and vomiting after your treatment, we encourage you to take your nausea medication as prescribed.     If you develop nausea and vomiting that is not controlled by your nausea medication, call the clinic.   BELOW ARE SYMPTOMS THAT SHOULD BE REPORTED IMMEDIATELY:  *FEVER GREATER THAN 100.5 F  *CHILLS WITH OR WITHOUT FEVER  NAUSEA AND VOMITING THAT IS NOT CONTROLLED WITH YOUR NAUSEA MEDICATION  *UNUSUAL SHORTNESS OF BREATH  *UNUSUAL BRUISING OR BLEEDING  TENDERNESS IN MOUTH AND THROAT WITH OR WITHOUT PRESENCE OF ULCERS  *URINARY PROBLEMS  *BOWEL PROBLEMS  UNUSUAL RASH Items with * indicate a potential emergency and should be followed up as soon as possible.  Feel free to call the clinic should you have any questions or concerns. The clinic phone number is [phone].  It is my pleasure to take care of you today!  Leeanne Rio, RN

## 2013-12-23 NOTE — Progress Notes (Signed)
Peripheral IV charted in error on wrong patient.

## 2013-12-23 NOTE — Progress Notes (Signed)
Patient ID: Anita Randolph, female   DOB: Apr 24, 1951, 63 y.o.   MRN: [id number] ID: Anita Randolph   DOB: 17-Nov-1951  MR#: [id number]  TSV#:[id number]  PCP: Anita Randolph GYN: Anita Randolph SU: Anita Randolph], Anita Randolph OTHER MD: Anita Randolph, Anita Randolph, Anita Randolph   HISTORY OF PRESENT ILLNESS: Anita Randolph herself palpated a mass in her right breast 11/09/2012. She brought this to Dr. Julieta Randolph attention and mammography was obtained at [facility], (I do not have that report today) confirming a mass in the lower inner quadrant of the right breast. 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  Bilateral breast MRI 11/22/2012 showed a far posterior right breast mass measuring 3.2 cm with no discernible fat plane between the mass and the underlying pectoralis major. There was extension of irregular enhancement into the superficial aspect of the cholesterol is muscle underlying the mass, but there was no evidence for chest wall involvement, internal mammary artery chain or axillary lymph node involvement, or anything else in the breast or the contralateral breast.  The patient's subsequent history is as detailed below  INTERVAL HISTORY: Anita Randolph returns today for followup of her breast cancer accompanied by her husband Anita Randolph.today she received the last dose of trastuzumab. To celebrate her children and significant others are coming this weekend. They're also planning a cruise mid February to the [place].   REVIEW OF SYSTEMS: Thinking back on the whole of the last year, the chemotherapy clearly was the most difficult part. She was very fatigued. She did well with the surgery and the radiation. She kept a block, but she is concluding it now that she completes her Herceptin treatments. She has not had problems with vaginal  dryness. She has not had hot flashes. She has had muscle cramps, which are very likely related to dehydration. She had many questions regarding the future, but review of systems today was otherwise entirely negative.  PAST MEDICAL HISTORY: Past Medical History  Diagnosis Date  . Cancer     breast  . Breast cancer 11/15/12    Right invasive ductal ca  . Neuromuscular disorder     tingling hands and feet from chemo-getting better  . GERD (gastroesophageal reflux disease)     only during chemotherapy  . Hypertension   . Radiation 06/06/13-07/22/13    Right breast  . Dysplastic nevus 11/2009    -w/atypia right labia majora  . Fibroid 2007    largest 5x5x5cm  . Microscopic hematuria     negative w/u  . Anemia     s/p chemotherapy  . Osteopenia due to cancer therapy     PAST SURGICAL HISTORY: Past Surgical History  Procedure Laterality Date  . Portacath placement  12/20/2012    Procedure: INSERTION PORT-A-CATH;  Surgeon: Anita Lasso, MD;  Location: [place];  Service: General;  Laterality: N/A;  . Tonsillectomy    . Arm skin lesion biopsy / excision  2011    nodular faciitis lt arm  . Colonoscopy    . Breast lumpectomy with needle localization and axillary sentinel lymph node bx Right 05/07/2013    Procedure: NEEDLE LOCALIZATION RIGHT BREAST LUMPECTOMY AND   SENTINEL LYMPH NODE  ;  Surgeon: Anita Lasso, MD;  Location: [place];  Service: General;  Laterality: Right;  . Breast surgery    .  Lipoma excision      -right back(age 61)    FAMILY HISTORY Family History  Problem Relation Age of Onset  . Breast cancer Mother 75  . Osteoporosis Mother   . Diabetes Mother   . Thyroid disease Mother   . Heart disease Father   . Bladder Cancer Father 77  . Breast cancer Maternal Aunt     diagnosed in her 36s  . Multiple sclerosis Paternal Aunt   . Heart disease Maternal Grandmother   . Diabetes Maternal Grandmother   . Mental retardation Cousin      maternal cousins; brothers, sons of aunt with breast cancer   the patient's father immigrated from [place] 1938, living in [place] for about 10 years. He had significant coronary artery disease, bladder cancer, and a ruptured aneurysm, but lived to be 79. The patient's mother is living at age 29. The patient has one brother, no sisters. The patient's mother had breast cancer diagnosed at age 69. The patient is of a Ashkenazi Jewish descent. She will be meeting with our genetics counselor today  GYNECOLOGIC HISTORY: Menarche age 36, first live birth age 63, she is [place] P2, menopause 2006, received hormone replacement until approximately 2010.  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 Her husband Anita Randolph has a management position for [employer]. This involves a lot of traveling. Son Anita Randolph lives in [place] and is a sports Catering manager. Daughter Anita Randolph lives in [place], where she is a Sport and exercise psychologist in international developments and is also getting an MPH in Physicist, medical. The patient has no grandchildren.   ADVANCED DIRECTIVES: In place  HEALTH MAINTENANCE: History  Substance Use Topics  . Smoking status: Former Smoker -- 1.00 packs/day for 4 years    Types: Cigarettes    Quit date: 11/30/1974  . Smokeless tobacco: Never Used  . Alcohol Use: 1.0 oz/week    2 drink(s) per week     Comment: 1-2 glasses of wine per week   Anita Randolph exercises 3-4 times a week, doing weights, walking, and other structures exercises.   Colonoscopy: 2009/ [facility]  PAP: 2013  Bone density: AUG 2014 shows T -1.9  Lipid panel:  No Known Allergies  Current Outpatient Prescriptions  Medication Sig Dispense Refill  . calcium-vitamin D (OSCAL WITH D) 500-200 MG-UNIT per tablet Take 1 tablet by mouth 2 (two) times daily. 600 vit d      . simvastatin (ZOCOR) 20 MG tablet Take 20 mg by mouth daily.      . tamoxifen (NOLVADEX) 20 MG tablet Take 1 tablet (20 mg  total) by mouth daily.  90 tablet  4   No current facility-administered medications for this visit.    OBJECTIVE: Middle-aged white woman in no acute distress Filed Vitals:   12/23/13 1100  BP: 127/63  Pulse: 84  Temp: 97.9 F (36.6 C)  Resp: 18     Body mass index is 30.21 kg/(m^2).    ECOG FS: 0 Filed Weights   12/23/13 1100  Weight: 170 lb 8 oz (77.338 kg)   Sclerae unicteric, pupils equal round and reactive to light Oropharynx shows no thrush, dentition is excellent No cervical or supraclavicular adenopathy Lungs clear to auscultation, good excursion bilaterally Heart regular rate and rhythm, no murmur appreciated Abdomen soft, nontender, positive bowel sounds MSK no focal spinal tenderness, no kyphosis or scoliosis No upper extremity lymphedema Neuro: nonfocal, well oriented, positive affect Breasts:  The right breast is status post  lumpectomy and radiation. There is minimal hyperpigmentation remaining. There is no evidence of local recurrence. The right axilla is benign. Left breast is unremarkable Skin: Port is intact in the left upper chest wall     LAB RESULTS: CBC    Component Value Date/Time   WBC 6.0 12/23/2013 0849   RBC 4.47 12/23/2013 0849   HGB 12.7 12/23/2013 0849   HCT 37.3 12/23/2013 0849   PLT 232 12/23/2013 0849   MCV 83.4 12/23/2013 0849   MCH 28.4 12/23/2013 0849   MCHC 34.0 12/23/2013 0849   RDW 14.5 12/23/2013 0849   LYMPHSABS 1.3 12/23/2013 0849   MONOABS 0.3 12/23/2013 0849   EOSABS 0.1 12/23/2013 0849   BASOSABS 0.0 12/23/2013 0849       Chemistry      Component Value Date/Time   NA 141 12/23/2013 0851   K 3.8 12/23/2013 0851   CL 109* 05/02/2013 1043   CO2 24 12/23/2013 0851   BUN 24.7 12/23/2013 0851   BUN 15 08/23/2010 1350   CREATININE 1.2* 12/23/2013 0851   CREATININE 1.00 08/23/2010 1350      Component Value Date/Time   CALCIUM 9.2 12/23/2013 0851        STUDIES:  *RADIOLOGY REPORT*  Clinical Data: Postmenopausal.  DUAL X-RAY  ABSORPTIOMETRY (DXA) FOR BONE MINERAL DENSITY  AP LUMBAR SPINE  Bone Mineral Density (BMD): 0.908 g/cm2  Young Adult T Score: -1.3  Z Score: 0.2  LEFT FEMUR NECK  Bone Mineral Density (BMD): 0.638 g/cm2  Young Adult T Score: -1.9  Z Score: -0.6  ASSESSMENT: Patient's diagnostic category is LOW BONE MASS by WHO  Criteria.  FRACTURE RISK: MODERATE  FRAX: Based on the [place] model, the 10  year probability of a major osteoporotic fracture is 8.9%. The 10  year probability of a hip fracture is 1.0%.    ASSESSMENT: 63 y.o. [place] woman   (1)  s/p right breast biopsy 11/15/2012 for a clinical T2 N0, stage IIA invasive ductal carcinoma, grade 3, with superficial involvement of the pectoralis muscle, estrogen receptor 29% positive, progesterone receptor negative, with an Mib-1 of 79% and HER-2 amplification by CISH with a ratio of 4.39  (2) treated in the neoadjuvant setting, completing 6 cycles of docetaxel/ carboplatin/ trastuzumab 04/11/2013  (3) trastuzumab continued for total of one year (through January 2015).-- Most recent echo 10/08/2013 showed a well-preserved ejecton fraction  (4) right lumpectomy and sentinel lymph node sampling 05/07/2013 showed a pathologic complete response  (5) adjuvant radiation completed 07/22/2013  (6) started tamoxifen 08/20/2013  (7) osteopenia-- to receive denosumab twice a year starting April 2015  PLAN:  Chinita completed her one year of trastuzumab today. She did fine with that, and has had no evidence of heart damage. We are scheduling 1 final echo for late February just to document that.  She is ready to have her port removed. We are sending her to Dr. Donne Hazel to get that accomplished and also so he can become her default surgeon, now that Dr. Margot Chimes is retired. She does not want the port removed until after her February cruise however. Same with her mammogram, which has been scheduled for [facility] in late  February.  Today we discussed her osteopenia. While she is on tamoxifen and I will probably remained stable, but we are likely to switch to an aromatase inhibitor after 2 years on tamoxifen and it may be a good idea to get a running start on this problem. We talked about  alendronate, zolendronate, and denosumab as the chief choices. After much discussion and with a good understanding of the possible toxicities, side effects and complications as well as the possible benefits she is decided she would like to try the Proliaa/denosumab and she will receive that at her next visit here, which will be in April.  As far as followup is concerned she is going to see me every 3 months until she completes 2 years from her surgery, then every 6 months for the next 3 years after.  Norva has a good understanding of the overall plan. She agrees with it. She knows the goal of treatment in her cases cure. She will call with any problems that may develop before her next visit here  MAGRINAT,GUSTAV C    12/23/2013

## 2014-01-21 ENCOUNTER — Encounter: Payer: Self-pay | Admitting: Oncology

## 2014-01-21 ENCOUNTER — Encounter (INDEPENDENT_AMBULATORY_CARE_PROVIDER_SITE_OTHER): Payer: Self-pay | Admitting: General Surgery

## 2014-01-21 ENCOUNTER — Ambulatory Visit (HOSPITAL_COMMUNITY)
Admission: RE | Admit: 2014-01-21 | Discharge: 2014-01-21 | Disposition: A | Discharge status: Home / Self Care | Payer: 59 | Source: Ambulatory Visit | Attending: Oncology | Admitting: Oncology

## 2014-01-21 ENCOUNTER — Ambulatory Visit (INDEPENDENT_AMBULATORY_CARE_PROVIDER_SITE_OTHER): Payer: 59 | Admitting: General Surgery

## 2014-01-21 VITALS — BP 140/84 | HR 68 | Temp 98.1°F | Resp 14 | Ht 64.0 in | Wt 170.4 lb

## 2014-01-21 DIAGNOSIS — M8580 Other specified disorders of bone density and structure, unspecified site: Secondary | ICD-10-CM

## 2014-01-21 DIAGNOSIS — Z9221 Personal history of antineoplastic chemotherapy: Secondary | ICD-10-CM | POA: Insufficient documentation

## 2014-01-21 DIAGNOSIS — C50511 Malignant neoplasm of lower-outer quadrant of right female breast: Secondary | ICD-10-CM

## 2014-01-21 DIAGNOSIS — I517 Cardiomegaly: Secondary | ICD-10-CM

## 2014-01-21 DIAGNOSIS — M899 Disorder of bone, unspecified: Secondary | ICD-10-CM | POA: Insufficient documentation

## 2014-01-21 DIAGNOSIS — C50519 Malignant neoplasm of lower-outer quadrant of unspecified female breast: Secondary | ICD-10-CM

## 2014-01-21 DIAGNOSIS — Z87891 Personal history of nicotine dependence: Secondary | ICD-10-CM | POA: Insufficient documentation

## 2014-01-21 DIAGNOSIS — K219 Gastro-esophageal reflux disease without esophagitis: Secondary | ICD-10-CM | POA: Insufficient documentation

## 2014-01-21 DIAGNOSIS — M949 Disorder of cartilage, unspecified: Secondary | ICD-10-CM

## 2014-01-21 DIAGNOSIS — R609 Edema, unspecified: Secondary | ICD-10-CM | POA: Insufficient documentation

## 2014-01-21 DIAGNOSIS — Z853 Personal history of malignant neoplasm of breast: Secondary | ICD-10-CM | POA: Insufficient documentation

## 2014-01-21 NOTE — Progress Notes (Signed)
Patient ID: Anita Randolph, female   DOB: [birth year], 63 y.o.   MRN: [id number]  Chief Complaint  Patient presents with  . Routine Post Op    discuss PAC removal    HPI Anita Randolph is a 63 y.o. female.   HPI 63 year old female former patient of Dr. Konrad Dolores who underwent lumpectomy, sentinel node biopsy and port. She has finished her chemotherapy and herceptin. She has also completed radiotherapy. She is on tamoxifen. She has done very well with this and now would like her port removed. Past Medical History  Diagnosis Date  . Cancer     breast  . Breast cancer 11/15/12    Right invasive ductal ca  . Neuromuscular disorder     tingling hands and feet from chemo-getting better  . GERD (gastroesophageal reflux disease)     only during chemotherapy  . Hypertension   . Radiation 06/06/13-07/22/13    Right breast  . Dysplastic nevus 11/2009    -w/atypia right labia majora  . Fibroid 2007    largest 5x5x5cm  . Microscopic hematuria     negative w/u  . Anemia     s/p chemotherapy  . Osteopenia due to cancer therapy     Past Surgical History  Procedure Laterality Date  . Portacath placement  12/20/2012    Procedure: INSERTION PORT-A-CATH;  Surgeon: Haywood Lasso, MD;  Location: [place];  Service: General;  Laterality: N/A;  . Tonsillectomy    . Arm skin lesion biopsy / excision  2011    nodular faciitis lt arm  . Colonoscopy    . Breast lumpectomy with needle localization and axillary sentinel lymph node bx Right 05/07/2013    Procedure: NEEDLE LOCALIZATION RIGHT BREAST LUMPECTOMY AND   SENTINEL LYMPH NODE  ;  Surgeon: Haywood Lasso, MD;  Location: [place];  Service: General;  Laterality: Right;  . Breast surgery    . Lipoma excision      -right back(age 73)    Family History  Problem Relation Age of Onset  . Breast cancer Mother 50  . Osteoporosis Mother   . Diabetes Mother   . Thyroid disease Mother   . Heart disease Father   . Bladder Cancer  Father 73  . Breast cancer Maternal Aunt     diagnosed in her 58s  . Multiple sclerosis Paternal Aunt   . Heart disease Maternal Grandmother   . Diabetes Maternal Grandmother   . Mental retardation Cousin     maternal cousins; brothers, sons of aunt with breast cancer    Social History History  Substance Use Topics  . Smoking status: Former Smoker -- 1.00 packs/day for 4 years    Types: Cigarettes    Quit date: 11/30/1974  . Smokeless tobacco: Never Used  . Alcohol Use: 1.0 oz/week    2 drink(s) per week     Comment: 1-2 glasses of wine per week    No Known Allergies  Current Outpatient Prescriptions  Medication Sig Dispense Refill  . calcium-vitamin D (OSCAL WITH D) 500-200 MG-UNIT per tablet Take 1 tablet by mouth 2 (two) times daily. 600 vit d      . simvastatin (ZOCOR) 20 MG tablet Take 20 mg by mouth daily.      . tamoxifen (NOLVADEX) 20 MG tablet Take 1 tablet (20 mg total) by mouth daily.  90 tablet  4   No current facility-administered medications for this visit.    Review of Systems Review of  Systems  Constitutional: Negative for fever, chills and unexpected weight change.  HENT: Negative for congestion, hearing loss, sore throat, trouble swallowing and voice change.   Eyes: Negative for visual disturbance.  Respiratory: Negative for cough and wheezing.   Cardiovascular: Negative for chest pain, palpitations and leg swelling.  Gastrointestinal: Negative for nausea, vomiting, abdominal pain, diarrhea, constipation, blood in stool, abdominal distention and anal bleeding.  Genitourinary: Negative for hematuria, vaginal bleeding and difficulty urinating.  Musculoskeletal: Negative for arthralgias.  Skin: Negative for rash and wound.  Neurological: Negative for seizures, syncope and headaches.  Hematological: Negative for adenopathy. Does not bruise/bleed easily.  Psychiatric/Behavioral: Negative for confusion.    Blood pressure 140/84, pulse 68, temperature 98.1  F (36.7 C), temperature source Oral, resp. rate 14, height 5\' 4"  (1.626 m), weight 170 lb 6.4 oz (77.293 kg), last menstrual period 11/28/2004.  Physical Exam Physical Exam  Vitals reviewed. Constitutional: She appears well-developed and well-nourished.  Cardiovascular: Normal rate, regular rhythm and normal heart sounds.   Pulmonary/Chest: Effort normal and breath sounds normal. She has no wheezes. She has no rales.      Data Reviewed Old notes  Assessment    Breast cancer No longer needs venous access     Plan    Port removal  Plan for port removal in the near future. We discussed the risks and benefits associated with this.  I also told her I would be happy to follow her long-term now that Dr. Margot Chimes as retired.       Dreshawn Hendershott 01/21/2014, 1:19 PM

## 2014-01-31 ENCOUNTER — Ambulatory Visit
Admission: RE | Admit: 2014-01-31 | Discharge: 2014-01-31 | Disposition: A | Discharge status: Home / Self Care | Payer: Self-pay | Source: Ambulatory Visit | Attending: Oncology | Admitting: Oncology

## 2014-01-31 ENCOUNTER — Ambulatory Visit
Admission: RE | Admit: 2014-01-31 | Discharge: 2014-01-31 | Disposition: A | Discharge status: Home / Self Care | Payer: 59 | Source: Ambulatory Visit | Attending: Oncology | Admitting: Oncology

## 2014-01-31 ENCOUNTER — Other Ambulatory Visit: Payer: Self-pay | Admitting: Oncology

## 2014-01-31 DIAGNOSIS — C50511 Malignant neoplasm of lower-outer quadrant of right female breast: Secondary | ICD-10-CM

## 2014-01-31 DIAGNOSIS — M8580 Other specified disorders of bone density and structure, unspecified site: Secondary | ICD-10-CM

## 2014-02-06 ENCOUNTER — Telehealth (HOSPITAL_COMMUNITY): Payer: Self-pay | Admitting: Cardiology

## 2014-02-06 ENCOUNTER — Encounter (HOSPITAL_COMMUNITY): Payer: Self-pay | Admitting: Cardiology

## 2014-02-06 NOTE — Telephone Encounter (Signed)
Attempting to contact pt to schedule follow up I have been unable to reach this patient by phone.  A letter is being sent to the last known home address.  

## 2014-02-18 DIAGNOSIS — Z452 Encounter for adjustment and management of vascular access device: Secondary | ICD-10-CM

## 2014-03-24 ENCOUNTER — Other Ambulatory Visit: Payer: Self-pay | Admitting: *Deleted

## 2014-03-24 DIAGNOSIS — C50511 Malignant neoplasm of lower-outer quadrant of right female breast: Secondary | ICD-10-CM

## 2014-03-25 ENCOUNTER — Telehealth: Payer: Self-pay | Admitting: Oncology

## 2014-03-25 ENCOUNTER — Ambulatory Visit (HOSPITAL_BASED_OUTPATIENT_CLINIC_OR_DEPARTMENT_OTHER): Payer: 59 | Admitting: Oncology

## 2014-03-25 ENCOUNTER — Ambulatory Visit (HOSPITAL_BASED_OUTPATIENT_CLINIC_OR_DEPARTMENT_OTHER): Payer: 59

## 2014-03-25 ENCOUNTER — Other Ambulatory Visit (HOSPITAL_BASED_OUTPATIENT_CLINIC_OR_DEPARTMENT_OTHER): Payer: 59

## 2014-03-25 VITALS — BP 143/84 | HR 76 | Temp 98.0°F | Resp 18 | Ht 64.0 in | Wt 172.9 lb

## 2014-03-25 VITALS — BP 140/83 | HR 71 | Temp 97.7°F

## 2014-03-25 DIAGNOSIS — C50511 Malignant neoplasm of lower-outer quadrant of right female breast: Secondary | ICD-10-CM

## 2014-03-25 DIAGNOSIS — M949 Disorder of cartilage, unspecified: Secondary | ICD-10-CM

## 2014-03-25 DIAGNOSIS — C50319 Malignant neoplasm of lower-inner quadrant of unspecified female breast: Secondary | ICD-10-CM

## 2014-03-25 DIAGNOSIS — M899 Disorder of bone, unspecified: Secondary | ICD-10-CM

## 2014-03-25 DIAGNOSIS — M8580 Other specified disorders of bone density and structure, unspecified site: Secondary | ICD-10-CM

## 2014-03-25 LAB — COMPREHENSIVE METABOLIC PANEL (CC13)
ALBUMIN: 3.5 g/dL (ref 3.5–5.0)
ALT: 21 U/L (ref 0–55)
ANION GAP: 8 meq/L (ref 3–11)
AST: 24 U/L (ref 5–34)
Alkaline Phosphatase: 57 U/L (ref 40–150)
BUN: 15.6 mg/dL (ref 7.0–26.0)
CALCIUM: 9.5 mg/dL (ref 8.4–10.4)
CO2: 26 meq/L (ref 22–29)
Chloride: 108 mEq/L (ref 98–109)
Creatinine: 1.1 mg/dL (ref 0.6–1.1)
Glucose: 101 mg/dl (ref 70–140)
POTASSIUM: 4 meq/L (ref 3.5–5.1)
SODIUM: 142 meq/L (ref 136–145)
TOTAL PROTEIN: 6.7 g/dL (ref 6.4–8.3)
Total Bilirubin: 0.38 mg/dL (ref 0.20–1.20)

## 2014-03-25 LAB — CBC WITH DIFFERENTIAL/PLATELET
BASO%: 0.8 % (ref 0.0–2.0)
BASOS ABS: 0 10*3/uL (ref 0.0–0.1)
EOS ABS: 0.2 10*3/uL (ref 0.0–0.5)
EOS%: 3 % (ref 0.0–7.0)
HCT: 39 % (ref 34.8–46.6)
HGB: 13 g/dL (ref 11.6–15.9)
LYMPH%: 28.7 % (ref 14.0–49.7)
MCH: 28.2 pg (ref 25.1–34.0)
MCHC: 33.3 g/dL (ref 31.5–36.0)
MCV: 84.6 fL (ref 79.5–101.0)
MONO#: 0.4 10*3/uL (ref 0.1–0.9)
MONO%: 7 % (ref 0.0–14.0)
NEUT%: 60.5 % (ref 38.4–76.8)
NEUTROS ABS: 3.2 10*3/uL (ref 1.5–6.5)
PLATELETS: 236 10*3/uL (ref 145–400)
RBC: 4.61 10*6/uL (ref 3.70–5.45)
RDW: 14.6 % — AB (ref 11.2–14.5)
WBC: 5.3 10*3/uL (ref 3.9–10.3)
lymph#: 1.5 10*3/uL (ref 0.9–3.3)

## 2014-03-25 MED ORDER — DENOSUMAB 60 MG/ML ~~LOC~~ SOLN
60.0000 mg | Freq: Once | SUBCUTANEOUS | Status: AC
  Administered 2014-03-25: 60 mg via SUBCUTANEOUS
  Filled 2014-03-25: qty 1

## 2014-03-25 NOTE — Patient Instructions (Signed)
Denosumab injection  What is this medicine?  DENOSUMAB (den oh sue mab) slows bone breakdown. Prolia is used to treat osteoporosis in women after menopause and in men. Xgeva is used to prevent bone fractures and other bone problems caused by cancer bone metastases. Xgeva is also used to treat giant cell tumor of the bone.  This medicine may be used for other purposes; ask your health care provider or pharmacist if you have questions.  COMMON BRAND NAME(S): Prolia, XGEVA  What should I tell my health care provider before I take this medicine?  They need to know if you have any of these conditions:  -dental disease  -eczema  -infection or history of infections  -kidney disease or on dialysis  -low blood calcium or vitamin D  -malabsorption syndrome  -scheduled to have surgery or tooth extraction  -taking medicine that contains denosumab  -thyroid or parathyroid disease  -an unusual reaction to denosumab, other medicines, foods, dyes, or preservatives  -pregnant or trying to get pregnant  -breast-feeding  How should I use this medicine?  This medicine is for injection under the skin. It is given by a health care professional in a hospital or clinic setting.  If you are getting Prolia, a special MedGuide will be given to you by the pharmacist with each prescription and refill. Be sure to read this information carefully each time.  For Prolia, talk to your pediatrician regarding the use of this medicine in children. Special care may be needed. For Xgeva, talk to your pediatrician regarding the use of this medicine in children. While this drug may be prescribed for children as young as 13 years for selected conditions, precautions do apply.  Overdosage: If you think you've taken too much of this medicine contact a poison control center or emergency room at once.  Overdosage: If you think you have taken too much of this medicine contact a poison control center or emergency room at once.  NOTE: This medicine is only for  you. Do not share this medicine with others.  What if I miss a dose?  It is important not to miss your dose. Call your doctor or health care professional if you are unable to keep an appointment.  What may interact with this medicine?  Do not take this medicine with any of the following medications:  -other medicines containing denosumab  This medicine may also interact with the following medications:  -medicines that suppress the immune system  -medicines that treat cancer  -steroid medicines like prednisone or cortisone  This list may not describe all possible interactions. Give your health care provider a list of all the medicines, herbs, non-prescription drugs, or dietary supplements you use. Also tell them if you smoke, drink alcohol, or use illegal drugs. Some items may interact with your medicine.  What should I watch for while using this medicine?  Visit your doctor or health care professional for regular checks on your progress. Your doctor or health care professional may order blood tests and other tests to see how you are doing.  Call your doctor or health care professional if you get a cold or other infection while receiving this medicine. Do not treat yourself. This medicine may decrease your body's ability to fight infection.  You should make sure you get enough calcium and vitamin D while you are taking this medicine, unless your doctor tells you not to. Discuss the foods you eat and the vitamins you take with your health care professional.    See your dentist regularly. Brush and floss your teeth as directed. Before you have any dental work done, tell your dentist you are receiving this medicine.  Do not become pregnant while taking this medicine or for 5 months after stopping it. Women should inform their doctor if they wish to become pregnant or think they might be pregnant. There is a potential for serious side effects to an unborn child. Talk to your health care professional or pharmacist for more  information.  What side effects may I notice from receiving this medicine?  Side effects that you should report to your doctor or health care professional as soon as possible:  -allergic reactions like skin rash, itching or hives, swelling of the face, lips, or tongue  -breathing problems  -chest pain  -fast, irregular heartbeat  -feeling faint or lightheaded, falls  -fever, chills, or any other sign of infection  -muscle spasms, tightening, or twitches  -numbness or tingling  -skin blisters or bumps, or is dry, peels, or red  -slow healing or unexplained pain in the mouth or jaw  -unusual bleeding or bruising  Side effects that usually do not require medical attention (Report these to your doctor or health care professional if they continue or are bothersome.):  -muscle pain  -stomach upset, gas  This list may not describe all possible side effects. Call your doctor for medical advice about side effects. You may report side effects to FDA at 1-800-FDA-1088.  Where should I keep my medicine?  This medicine is only given in a clinic, doctor's office, or other health care setting and will not be stored at home.  NOTE: This sheet is a summary. It may not cover all possible information. If you have questions about this medicine, talk to your doctor, pharmacist, or health care provider.  © 2014, [employer]. (2012-05-14 12:37:47)

## 2014-03-25 NOTE — Progress Notes (Signed)
Patient ID: Anita Randolph, female   DOB: 1951-01-01, 63 y.o.   MRN: [id number] ID: Anita Randolph   DOB: 17-Aug-1951  MR#: [id number]  PDX#:[id number]  PCP: Mila Palmer GYN: M. Leda Quail SU: [[name]], Emelia Loron OTHER MD: Arvilla Meres, Amy [place], Rick Cornella   HISTORY OF PRESENT ILLNESS: Anita Randolph herself palpated a mass in her right breast 11/09/2012. She brought this to Dr. Harlene Salts attention and mammography was obtained at [facility], (I do not have that report today) confirming a mass in the lower inner quadrant of the right breast. 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  Bilateral breast MRI 11/22/2012 showed a far posterior right breast mass measuring 3.2 cm with no discernible fat plane between the mass and the underlying pectoralis major. There was extension of irregular enhancement into the superficial aspect of the cholesterol is muscle underlying the mass, but there was no evidence for chest wall involvement, internal mammary artery chain or axillary lymph node involvement, or anything else in the breast or the contralateral breast.  The patient's subsequent history is as detailed below  INTERVAL HISTORY: Anita Randolph returns today for followup of her breast cancer.the interval history is unremarkable and she is generally doing terrific. She just came back from a brief trip to [place] where she did a presentation, she is going to attend her daughter's graduation from graduate school and [place], works full-time, exercises regularly. "Life is good".  REVIEW OF SYSTEMS: She is tolerating the tamoxifen with no obvious side effects. Sometimes she feels a little warm, but she doesn't really have hot flashes or night sweats. She has the discomfort in the mid lower back bilaterally which tends to show up  more in the morning than other times. Sometimes it's more one side sometimes the other. It's not long lasting and for example today she has none. She suspects probably correctly that this is related to her exercise routine. Otherwise a detailed review of systems today was noncontributory  PAST MEDICAL HISTORY: Past Medical History  Diagnosis Date  . Cancer     breast  . Breast cancer 11/15/12    Right invasive ductal ca  . Neuromuscular disorder     tingling hands and feet from chemo-getting better  . GERD (gastroesophageal reflux disease)     only during chemotherapy  . Hypertension   . Radiation 06/06/13-07/22/13    Right breast  . Dysplastic nevus 11/2009    -w/atypia right labia majora  . Fibroid 2007    largest 5x5x5cm  . Microscopic hematuria     negative w/u  . Anemia     s/p chemotherapy  . Osteopenia due to cancer therapy     PAST SURGICAL HISTORY: Past Surgical History  Procedure Laterality Date  . Portacath placement  12/20/2012    Procedure: INSERTION PORT-A-CATH;  Surgeon: Currie Paris, MD;  Location: [facility] OR;  Service: General;  Laterality: N/A;  . Tonsillectomy    . Arm skin lesion biopsy / excision  2011    nodular faciitis lt arm  . Colonoscopy    . Breast lumpectomy with needle localization and axillary sentinel lymph node bx Right 05/07/2013    Procedure: NEEDLE LOCALIZATION RIGHT BREAST LUMPECTOMY AND   SENTINEL LYMPH NODE  ;  Surgeon: Currie Paris, MD;  Location: [facility];  Service:  General;  Laterality: Right;  . Breast surgery    . Lipoma excision      -right back(age 44)    FAMILY HISTORY Family History  Problem Relation Age of Onset  . Breast cancer Mother 49  . Osteoporosis Mother   . Diabetes Mother   . Thyroid disease Mother   . Heart disease Father   . Bladder Cancer Father 24  . Breast cancer Maternal Aunt     diagnosed in her 80s  . Multiple sclerosis Paternal Aunt   . Heart disease Maternal Grandmother   .  Diabetes Maternal Grandmother   . Mental retardation Cousin     maternal cousins; brothers, sons of aunt with breast cancer   the patient's father immigrated from [place] 1938, living in [place] for about 10 years. He had significant coronary artery disease, bladder cancer, and a ruptured aneurysm, but lived to be 49. The patient's mother is living at age 24. The patient has one brother, no sisters. The patient's mother had breast cancer diagnosed at age 10. The patient is of a Ashkenazi Jewish descent. She will be meeting with our genetics counselor today  GYNECOLOGIC HISTORY: Menarche age 16, first live birth age 61, she is [place] P2, menopause 2006, received hormone replacement until approximately 2010.  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 Her husband Anita Randolph has a management position for [employer]. This involves a lot of traveling. Son Anita Randolph lives in [place] and is a sports Catering manager. Daughter Anita Randolph lives in [place], where she is a Sport and exercise psychologist in international developments and is also getting an MPH in Physicist, medical. The patient has no grandchildren.   ADVANCED DIRECTIVES: In place  HEALTH MAINTENANCE: History  Substance Use Topics  . Smoking status: Former Smoker -- 1.00 packs/day for 4 years    Types: Cigarettes    Quit date: 11/30/1974  . Smokeless tobacco: Never Used  . Alcohol Use: 1.0 oz/week    2 drink(s) per week     Comment: 1-2 glasses of wine per week   Anita Randolph exercises 3-4 times a week, doing weights, walking, and other structures exercises.   Colonoscopy: 2009/ [facility]  PAP: 2013  Bone density: AUG 2014 shows T -1.9  Lipid panel:  No Known Allergies  Current Outpatient Prescriptions  Medication Sig Dispense Refill  . calcium-vitamin D (OSCAL WITH D) 500-200 MG-UNIT per tablet Take 1 tablet by mouth 2 (two) times daily. 600 vit d      . simvastatin (ZOCOR) 20 MG tablet Take 20 mg by mouth daily.       . tamoxifen (NOLVADEX) 20 MG tablet Take 1 tablet (20 mg total) by mouth daily.  90 tablet  4   No current facility-administered medications for this visit.    OBJECTIVE: Middle-aged white woman who appears well Filed Vitals:   03/25/14 1124  BP: 143/84  Pulse: 76  Temp: 98 F (36.7 C)  Resp: 18     Body mass index is 29.66 kg/(m^2).    ECOG FS: 0 Filed Weights   03/25/14 1124  Weight: 172 lb 14.4 oz (78.427 kg)   Sclerae unicteric, EOMs intact Oropharynx clear, teeth in good repair No cervical or supraclavicular adenopathy Lungs clear to auscultation, good excursion bilaterally Heart regular rate and rhythm, no murmur appreciated Abdomen soft, nontender, positive bowel sounds MSK no focal spinal tenderness2 vigorous and fluoroscopy palpation and percussion;  No upper extremity lymphedema Neuro: nonfocal, well oriented, positive affect  Breasts:  The right breast is status post lumpectomy and radiation. There is no evidence of local recurrence. The right axilla is benign. Left breast is unremarkable  LAB RESULTS: CBC    Component Value Date/Time   WBC 5.3 03/25/2014 1113   RBC 4.61 03/25/2014 1113   HGB 13.0 03/25/2014 1113   HCT 39.0 03/25/2014 1113   PLT 236 03/25/2014 1113   MCV 84.6 03/25/2014 1113   MCH 28.2 03/25/2014 1113   MCHC 33.3 03/25/2014 1113   RDW 14.6* 03/25/2014 1113   LYMPHSABS 1.5 03/25/2014 1113   MONOABS 0.4 03/25/2014 1113   EOSABS 0.2 03/25/2014 1113   BASOSABS 0.0 03/25/2014 1113       Chemistry      Component Value Date/Time   NA 141 12/23/2013 0851   K 3.8 12/23/2013 0851   CL 109* 05/02/2013 1043   CO2 24 12/23/2013 0851   BUN 24.7 12/23/2013 0851   BUN 15 08/23/2010 1350   CREATININE 1.2* 12/23/2013 0851   CREATININE 1.00 08/23/2010 1350      Component Value Date/Time   CALCIUM 9.2 12/23/2013 0851        STUDIES: No results found.  ASSESSMENT: 63 y.o. [place] woman   (1)  s/p right breast biopsy 11/15/2012 for a clinical T2 N0, stage  IIA invasive ductal carcinoma, grade 3, with superficial involvement of the pectoralis muscle, estrogen receptor 29% positive, progesterone receptor negative, with an Mib-1 of 79% and HER-2 amplification by CISH with a ratio of 4.39  (2) treated in the neoadjuvant setting, completing 6 cycles of docetaxel/ carboplatin/ trastuzumab 04/11/2013  (3) trastuzumab continued for total of one year (through January 2015).-- Final echo 01/21/2014 showed a well-preserved ejecton fraction  (4) right lumpectomy and sentinel lymph node sampling 05/07/2013 showed a pathologic complete response  (5) adjuvant radiation completed 07/22/2013  (6) started tamoxifen 08/20/2013  (7) osteopenia-- receiving denosumab twice a year starting April 2015  PLAN:  Ruweyda is doing fine from a breast cancer point of view now nearly one year out from her definitive surgery. She understands she has an excellent prognosis. She is tolerating the tamoxifen well and the plan is to continue that at least for 2 years and then reassess.  She does have osteopenia and we are starting Prolia today. She understands the possible side effects, toxicities and complications of this agent. I suggested she take extra calcium for the next 3 days. She plans to carry sometimes with her in case she develops any paresthesias. Osteonecrosis of the jaw would be extremely unlikely given the fact that she is not having any jaw surgery and that we are giving this twice a year and not monthly  Liviana will see Dr. Donne Hazel in the next 3 months. She will see me again in 6 months, with her next dose of Prolia. She will see Dr. Sabra Heck in December. Kammi knows to call for any problems that may develop before next visit here.     Virgie Dad Drako Maese    03/25/2014

## 2014-03-25 NOTE — Progress Notes (Signed)
Consent signed for Prolia injection.  All questions answered

## 2014-03-25 NOTE — Telephone Encounter (Signed)
, °

## 2014-05-06 ENCOUNTER — Telehealth: Payer: Self-pay | Admitting: *Deleted

## 2014-05-06 NOTE — Telephone Encounter (Signed)
Pt called to this RN to state concern " because I am trying to do my monthly breast exams and now I think I found something in the R breast right at the place I had the cancer but then I don't know what my new normal is "  " Dr Jana Hakim said for me to call if I had concerns and this is probably nothing but then my new breast just feels different ".  Plan per discussion is pt will stop in pm 6/10 and ask for this RN for assessment of her concern.

## 2014-06-19 ENCOUNTER — Ambulatory Visit (INDEPENDENT_AMBULATORY_CARE_PROVIDER_SITE_OTHER): Payer: 59 | Admitting: General Surgery

## 2014-06-20 ENCOUNTER — Ambulatory Visit (INDEPENDENT_AMBULATORY_CARE_PROVIDER_SITE_OTHER): Payer: 59 | Admitting: General Surgery

## 2014-07-03 ENCOUNTER — Ambulatory Visit (INDEPENDENT_AMBULATORY_CARE_PROVIDER_SITE_OTHER): Payer: 59 | Admitting: General Surgery

## 2014-08-18 ENCOUNTER — Ambulatory Visit (INDEPENDENT_AMBULATORY_CARE_PROVIDER_SITE_OTHER): Payer: 59 | Admitting: General Surgery

## 2014-09-09 ENCOUNTER — Other Ambulatory Visit: Payer: Self-pay | Admitting: *Deleted

## 2014-09-09 DIAGNOSIS — C50511 Malignant neoplasm of lower-outer quadrant of right female breast: Secondary | ICD-10-CM

## 2014-09-10 ENCOUNTER — Other Ambulatory Visit (HOSPITAL_BASED_OUTPATIENT_CLINIC_OR_DEPARTMENT_OTHER): Payer: 59

## 2014-09-10 DIAGNOSIS — C50511 Malignant neoplasm of lower-outer quadrant of right female breast: Secondary | ICD-10-CM

## 2014-09-10 LAB — CBC WITH DIFFERENTIAL/PLATELET
BASO%: 1 % (ref 0.0–2.0)
Basophils Absolute: 0.1 10*3/uL (ref 0.0–0.1)
EOS ABS: 0.2 10*3/uL (ref 0.0–0.5)
EOS%: 3.2 % (ref 0.0–7.0)
HCT: 40 % (ref 34.8–46.6)
HGB: 13.2 g/dL (ref 11.6–15.9)
LYMPH#: 1.5 10*3/uL (ref 0.9–3.3)
LYMPH%: 25 % (ref 14.0–49.7)
MCH: 28.1 pg (ref 25.1–34.0)
MCHC: 33 g/dL (ref 31.5–36.0)
MCV: 85.2 fL (ref 79.5–101.0)
MONO#: 0.4 10*3/uL (ref 0.1–0.9)
MONO%: 6.7 % (ref 0.0–14.0)
NEUT%: 64.1 % (ref 38.4–76.8)
NEUTROS ABS: 3.9 10*3/uL (ref 1.5–6.5)
Platelets: 256 10*3/uL (ref 145–400)
RBC: 4.7 10*6/uL (ref 3.70–5.45)
RDW: 14.3 % (ref 11.2–14.5)
WBC: 6.1 10*3/uL (ref 3.9–10.3)

## 2014-09-10 LAB — COMPREHENSIVE METABOLIC PANEL (CC13)
ALT: 18 U/L (ref 0–55)
AST: 19 U/L (ref 5–34)
Albumin: 3.3 g/dL — ABNORMAL LOW (ref 3.5–5.0)
Alkaline Phosphatase: 36 U/L — ABNORMAL LOW (ref 40–150)
Anion Gap: 8 mEq/L (ref 3–11)
BUN: 16.9 mg/dL (ref 7.0–26.0)
CO2: 24 mEq/L (ref 22–29)
Calcium: 9.5 mg/dL (ref 8.4–10.4)
Chloride: 109 mEq/L (ref 98–109)
Creatinine: 1 mg/dL (ref 0.6–1.1)
Glucose: 97 mg/dl (ref 70–140)
Potassium: 4 mEq/L (ref 3.5–5.1)
Sodium: 142 mEq/L (ref 136–145)
Total Bilirubin: 0.44 mg/dL (ref 0.20–1.20)
Total Protein: 6.5 g/dL (ref 6.4–8.3)

## 2014-09-16 ENCOUNTER — Other Ambulatory Visit: Payer: Self-pay | Admitting: Oncology

## 2014-09-16 ENCOUNTER — Telehealth: Payer: Self-pay | Admitting: Oncology

## 2014-09-16 ENCOUNTER — Ambulatory Visit (HOSPITAL_BASED_OUTPATIENT_CLINIC_OR_DEPARTMENT_OTHER): Payer: 59

## 2014-09-16 ENCOUNTER — Ambulatory Visit (HOSPITAL_BASED_OUTPATIENT_CLINIC_OR_DEPARTMENT_OTHER): Payer: 59 | Admitting: Oncology

## 2014-09-16 VITALS — BP 131/69 | HR 82 | Temp 98.5°F | Resp 18 | Ht 64.0 in | Wt 166.3 lb

## 2014-09-16 DIAGNOSIS — C50511 Malignant neoplasm of lower-outer quadrant of right female breast: Secondary | ICD-10-CM

## 2014-09-16 DIAGNOSIS — M8580 Other specified disorders of bone density and structure, unspecified site: Secondary | ICD-10-CM

## 2014-09-16 DIAGNOSIS — C50311 Malignant neoplasm of lower-inner quadrant of right female breast: Secondary | ICD-10-CM

## 2014-09-16 DIAGNOSIS — Z170 Estrogen receptor positive status [ER+]: Secondary | ICD-10-CM

## 2014-09-16 MED ORDER — DENOSUMAB 60 MG/ML ~~LOC~~ SOLN
60.0000 mg | Freq: Once | SUBCUTANEOUS | Status: AC
  Administered 2014-09-16: 60 mg via SUBCUTANEOUS
  Filled 2014-09-16: qty 1

## 2014-09-16 NOTE — Progress Notes (Signed)
Patient ID: Anita Randolph, female   DOB: May 02, 1951, 63 y.o.   MRN: [id number] ID: Anita Randolph   DOB: 11/06/1951  MR#: [id number]  FBP#:[id number]  PCP: Anita Randolph GYN: Anita Randolph SU: [Anita Randolph], Anita Randolph OTHER MD: Anita Randolph, Anita Randolph, Anita Randolph   HISTORY OF PRESENT ILLNESS: From the original intake note:  Anita Randolph herself palpated a mass in her right breast 11/09/2012. She brought this to Anita Randolph attention and mammography was obtained at [facility], (I do not have that report today) confirming a mass in the lower inner quadrant of the right breast. 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  Bilateral breast MRI 11/22/2012 showed a far posterior right breast mass measuring 3.2 cm with no discernible fat plane between the mass and the underlying pectoralis major. There was extension of irregular enhancement into the superficial aspect of the cholesterol is muscle underlying the mass, but there was no evidence for chest wall involvement, internal mammary artery chain or axillary lymph node involvement, or anything else in the breast or the contralateral breast.  The patient's subsequent history is as detailed below  INTERVAL HISTORY: Anita Randolph returns today for followup of her breast cancer. the interval history is fine. She is planning to retire at the end of this year. She is planning to do some traveling with her husband and to go to the beach more often. She is exercising 2-3 times a week at the gym plus walking. Her son is getting married next year. She is very excited about that as well. Overall Anita Randolph is doing just terrific  REVIEW OF SYSTEMS: She is tolerating the tamoxifen with no hot flashes or vaginal wetness. A detailed review of systems today was otherwise entirely  negative.   PAST MEDICAL HISTORY: Past Medical History  Diagnosis Date  . Cancer     breast  . Breast cancer 11/15/12    Right invasive ductal ca  . Neuromuscular disorder     tingling hands and feet from chemo-getting better  . GERD (gastroesophageal reflux disease)     only during chemotherapy  . Hypertension   . Radiation 06/06/13-07/22/13    Right breast  . Dysplastic nevus 11/2009    -w/atypia right labia majora  . Fibroid 2007    largest 5x5x5cm  . Microscopic hematuria     negative w/u  . Anemia     s/p chemotherapy  . Osteopenia due to cancer therapy     PAST SURGICAL HISTORY: Past Surgical History  Procedure Laterality Date  . Portacath placement  12/20/2012    Procedure: INSERTION PORT-A-CATH;  Surgeon: Anita Lasso, MD;  Location: [facility];  Service: General;  Laterality: N/A;  . Tonsillectomy    . Arm skin lesion biopsy / excision  2011    nodular faciitis lt arm  . Colonoscopy    . Breast lumpectomy with needle localization and axillary sentinel lymph node bx Right 05/07/2013    Procedure: NEEDLE LOCALIZATION RIGHT BREAST LUMPECTOMY AND   SENTINEL LYMPH NODE  ;  Surgeon: Anita Lasso, MD;  Location: [facility] [facility];  Service: General;  Laterality: Right;  . Breast surgery    . Lipoma excision      -right back(age 23)    FAMILY HISTORY Family History  Problem Relation Age of Onset  .  Breast cancer Mother 63  . Osteoporosis Mother   . Diabetes Mother   . Thyroid disease Mother   . Heart disease Father   . Bladder Cancer Father 75  . Breast cancer Maternal Aunt     diagnosed in her 42s  . Multiple sclerosis Paternal Aunt   . Heart disease Maternal Grandmother   . Diabetes Maternal Grandmother   . Mental retardation Cousin     maternal cousins; brothers, sons of aunt with breast cancer   the patient's father immigrated from [place] 1938, living in [place] for about 10 years. He had significant coronary artery disease, bladder cancer, and  a ruptured aneurysm, but lived to be 7. The patient's mother is living at age 10. The patient has one brother, no sisters. The patient's mother had breast cancer diagnosed at age 4. The patient is of a Ashkenazi Jewish descent. She will be meeting with our genetics counselor today  GYNECOLOGIC HISTORY: Menarche age 81, first live birth age 85, she is [place] P2, menopause 2006, received hormone replacement until approximately 2010.  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 Her husband Anita Randolph has a management position for [employer]. This involves a lot of traveling. Son Anita Randolph lives in [place] and is a sports Catering manager. Daughter Anita Randolph lives in [place], where she is a Sport and exercise psychologist in international developments and is also getting an MPH in Physicist, medical. The patient has no grandchildren.   ADVANCED DIRECTIVES: In place  HEALTH MAINTENANCE: History  Substance Use Topics  . Smoking status: Former Smoker -- 1.00 packs/day for 4 years    Types: Cigarettes    Quit date: 11/30/1974  . Smokeless tobacco: Never Used  . Alcohol Use: 1.0 oz/week    2 drink(s) per week     Comment: 1-2 glasses of wine per week   Anita Randolph exercises 3-4 times a week, doing weights, walking, and other structures exercises.   Colonoscopy: 2009/ [facility]  PAP: 2013  Bone density: AUG 2014 shows T -1.9  Lipid panel:  No Known Allergies  Current Outpatient Prescriptions  Medication Sig Dispense Refill  . calcium-vitamin D (OSCAL WITH D) 500-200 MG-UNIT per tablet Take 1 tablet by mouth 2 (two) times daily. 600 vit d      . simvastatin (ZOCOR) 20 MG tablet Take 20 mg by mouth daily.      . tamoxifen (NOLVADEX) 20 MG tablet Take 1 tablet (20 mg total) by mouth daily.  90 tablet  4   No current facility-administered medications for this visit.    OBJECTIVE: Middle-aged white woman in no acute distress Filed Vitals:   09/16/14 0935  BP: 131/69  Pulse:  82  Temp: 98.5 F (36.9 C)  Resp: 18     Body mass index is 28.53 kg/(m^2).    ECOG FS: 0 Filed Weights   09/16/14 0935  Weight: 166 lb 4.8 oz (75.433 kg)   Sclerae unicteric, pupils equal and reactive Oropharynx clear and moist-- no thrush No cervical or supraclavicular adenopathy Lungs no rales or rhonchi Heart regular rate and rhythm Abd soft, nontender, positive bowel sounds MSK no focal spinal tenderness, no upper extremity lymphedema Neuro: nonfocal, well oriented, appropriate affect Breasts: The right breast is status post lumpectomy and radiation. There is no evidence of disease recurrence. The left breast is unremarkable.    LAB RESULTS: CBC    Component Value Date/Time   WBC 6.1 09/10/2014 0816   RBC 4.70 09/10/2014 0816  HGB 13.2 09/10/2014 0816   HCT 40.0 09/10/2014 0816   PLT 256 09/10/2014 0816   MCV 85.2 09/10/2014 0816   MCH 28.1 09/10/2014 0816   MCHC 33.0 09/10/2014 0816   RDW 14.3 09/10/2014 0816   LYMPHSABS 1.5 09/10/2014 0816   MONOABS 0.4 09/10/2014 0816   EOSABS 0.2 09/10/2014 0816   BASOSABS 0.1 09/10/2014 0816       Chemistry      Component Value Date/Time   NA 142 09/10/2014 0815   K 4.0 09/10/2014 0815   CL 109* 05/02/2013 1043   CO2 24 09/10/2014 0815   BUN 16.9 09/10/2014 0815   BUN 15 08/23/2010 1350   CREATININE 1.0 09/10/2014 0815   CREATININE 1.00 08/23/2010 1350      Component Value Date/Time   CALCIUM 9.5 09/10/2014 0815        STUDIES: CLINICAL DATA: Malignant lumpectomy of the right breast in June,  2014 and subsequent radiation therapy. Original diagnosis in  December, 2013 with neoadjuvant chemotherapy. Annual evaluation.  EXAM:  DIGITAL DIAGNOSTIC BILATERAL MAMMOGRAM WITH TOMOSYNTHESIS AND CAD  COMPARISON: 05/07/2013 (right), 01/18/2013 (right), 11/15/2012  (bilateral), dating back to 07/17/2007.  ACR Breast Density Category b: There are scattered areas of  fibroglandular density.  FINDINGS:  CC and MLO views  of both breasts including tomosynthesis and a spot  magnification view of the lumpectomy site in the right breast were  obtained. Post lumpectomy scarring in the lower inner right breast.  Scarring in the right axilla at the site of node resection. Post  radiation skin thickening and trabecular thickening in the right  breast. Port-A-Cath port overlies the left axilla on the MLO view.  No findings suspicious for malignancy in either breast.  Mammographic images were processed with CAD.  IMPRESSION:  No mammographic evidence of malignancy. Expected post lumpectomy and  post radiation changes in the right breast.  RECOMMENDATION:  Bilateral diagnostic mammography in 1 year.  I have discussed the findings and recommendations with the patient.  Results were also provided in writing at the conclusion of the  visit. If applicable, a reminder letter will be sent to the patient  regarding the next appointment.  BI-RADS CATEGORY 2: Benign finding(s).  Electronically Signed  By: Evangeline Dakin AnitaD.  On: 01/31/2014 07:53   ASSESSMENT: 64 y.o. [place] woman   (1)  s/p right breast biopsy 11/15/2012 for a clinical T2 N0, stage IIA invasive ductal carcinoma, grade 3, with superficial involvement of the pectoralis muscle, estrogen receptor 29% positive, progesterone receptor negative, with an Mib-1 of 79% and HER-2 amplification by CISH with a ratio of 4.39  (2) treated in the neoadjuvant setting, completing 6 cycles of docetaxel/ carboplatin/ trastuzumab 04/11/2013  (3) trastuzumab continued for total of one year (through January 2015).-- Final echo 01/21/2014 showed a well-preserved ejecton fraction  (4) right lumpectomy and sentinel lymph node sampling 05/07/2013 showed a pathologic complete response  (5) adjuvant radiation completed 07/22/2013  (6) started tamoxifen 08/20/2013  (7) osteopenia-- T score -1.9 on August 2014; receiving denosumab twice a year starting April 2015  PLAN:   Anita Randolph is very healthy and is tolerating tamoxifen fine. Hopefully when we repeat her bone density in August of 2016 she will show enough improvement that after completing 2 years of tamoxifen we can switch her to anastrozole, so she can complete a "five-year planned" otherwise she will have to be on tamoxifen for 10 years to get the same benefit.  She is already scheduled to see Dr.  Miller in December and she will see me in April after her next mammogram and for her next dose of denosumab. He she sees Dr. Donne Hazel in July of next year that would get her through the first 2 years with every three-month appointments which is the goal. After that we can back off a little.   She has a good understanding of the overall plan. She agrees with it. She knows the goal of treatment in her case is cure. She will call with any problems that may develop before next visit here.    MAGRINAT,GUSTAV C    09/16/2014

## 2014-09-16 NOTE — Telephone Encounter (Signed)
per pof to sch pt appt-gave pt copy of sch °

## 2014-09-24 ENCOUNTER — Other Ambulatory Visit: Payer: Self-pay | Admitting: Gynecology

## 2014-09-25 NOTE — Telephone Encounter (Signed)
Incoming Refill Request from [facility]: Zocor 20mg   Last AEX: 10/30/13 Discontinued on 10/08/13 Next AEX:11/04/14 Last MMG:  Please Advise

## 2014-09-25 NOTE — Telephone Encounter (Signed)
S/W Mickel Baas from [facility]. She will get the refill sent to pt's PCP. Encounter closed

## 2014-09-25 NOTE — Telephone Encounter (Signed)
Please have pharmacy send this request to Dr. Stephanie Acre, her PCP.  Thanks.

## 2014-09-25 NOTE — Telephone Encounter (Signed)
Last AEX 10/30/13

## 2014-09-29 ENCOUNTER — Encounter (INDEPENDENT_AMBULATORY_CARE_PROVIDER_SITE_OTHER): Payer: Self-pay | Admitting: General Surgery

## 2014-11-04 ENCOUNTER — Ambulatory Visit: Payer: Self-pay | Admitting: Obstetrics & Gynecology

## 2015-01-09 ENCOUNTER — Other Ambulatory Visit: Payer: Self-pay | Admitting: Family Medicine

## 2015-01-09 ENCOUNTER — Other Ambulatory Visit: Payer: Self-pay | Admitting: Oncology

## 2015-01-09 DIAGNOSIS — Z853 Personal history of malignant neoplasm of breast: Secondary | ICD-10-CM

## 2015-02-16 ENCOUNTER — Ambulatory Visit
Admission: RE | Admit: 2015-02-16 | Discharge: 2015-02-16 | Disposition: A | Discharge status: Home / Self Care | Payer: BLUE CROSS/BLUE SHIELD | Source: Ambulatory Visit | Attending: Oncology | Admitting: Oncology

## 2015-02-16 DIAGNOSIS — Z853 Personal history of malignant neoplasm of breast: Secondary | ICD-10-CM

## 2015-03-17 ENCOUNTER — Other Ambulatory Visit: Payer: Self-pay | Admitting: *Deleted

## 2015-03-17 DIAGNOSIS — C50511 Malignant neoplasm of lower-outer quadrant of right female breast: Secondary | ICD-10-CM

## 2015-03-18 ENCOUNTER — Telehealth: Payer: Self-pay | Admitting: Oncology

## 2015-03-18 ENCOUNTER — Ambulatory Visit (HOSPITAL_BASED_OUTPATIENT_CLINIC_OR_DEPARTMENT_OTHER): Payer: Self-pay | Admitting: Oncology

## 2015-03-18 ENCOUNTER — Other Ambulatory Visit (HOSPITAL_BASED_OUTPATIENT_CLINIC_OR_DEPARTMENT_OTHER): Payer: BLUE CROSS/BLUE SHIELD

## 2015-03-18 ENCOUNTER — Ambulatory Visit (HOSPITAL_BASED_OUTPATIENT_CLINIC_OR_DEPARTMENT_OTHER): Payer: BLUE CROSS/BLUE SHIELD

## 2015-03-18 VITALS — BP 145/71 | HR 85 | Temp 98.1°F | Resp 18 | Ht 64.0 in | Wt 168.7 lb

## 2015-03-18 DIAGNOSIS — Z79810 Long term (current) use of selective estrogen receptor modulators (SERMs): Secondary | ICD-10-CM

## 2015-03-18 DIAGNOSIS — C50311 Malignant neoplasm of lower-inner quadrant of right female breast: Secondary | ICD-10-CM

## 2015-03-18 DIAGNOSIS — C50919 Malignant neoplasm of unspecified site of unspecified female breast: Secondary | ICD-10-CM

## 2015-03-18 DIAGNOSIS — C50511 Malignant neoplasm of lower-outer quadrant of right female breast: Secondary | ICD-10-CM

## 2015-03-18 DIAGNOSIS — M8580 Other specified disorders of bone density and structure, unspecified site: Secondary | ICD-10-CM

## 2015-03-18 LAB — COMPREHENSIVE METABOLIC PANEL (CC13)
ALK PHOS: 40 U/L (ref 40–150)
ALT: 18 U/L (ref 0–55)
AST: 20 U/L (ref 5–34)
Albumin: 3.4 g/dL — ABNORMAL LOW (ref 3.5–5.0)
Anion Gap: 12 mEq/L — ABNORMAL HIGH (ref 3–11)
BUN: 15.1 mg/dL (ref 7.0–26.0)
CALCIUM: 8.9 mg/dL (ref 8.4–10.4)
CO2: 23 meq/L (ref 22–29)
Chloride: 106 mEq/L (ref 98–109)
Creatinine: 1 mg/dL (ref 0.6–1.1)
EGFR: 61 mL/min/{1.73_m2} — AB (ref >90)
Glucose: 123 mg/dl (ref 70–140)
Potassium: 4.2 mEq/L (ref 3.5–5.1)
Sodium: 142 mEq/L (ref 136–145)
Total Protein: 6.5 g/dL (ref 6.4–8.3)

## 2015-03-18 LAB — CBC WITH DIFFERENTIAL/PLATELET
BASO%: 0.6 % (ref 0.0–2.0)
BASOS ABS: 0 10*3/uL (ref 0.0–0.1)
EOS ABS: 0.2 10*3/uL (ref 0.0–0.5)
EOS%: 2.7 % (ref 0.0–7.0)
HCT: 39.2 % (ref 34.8–46.6)
HEMOGLOBIN: 13.1 g/dL (ref 11.6–15.9)
LYMPH%: 24.4 % (ref 14.0–49.7)
MCH: 29 pg (ref 25.1–34.0)
MCHC: 33.4 g/dL (ref 31.5–36.0)
MCV: 86.7 fL (ref 79.5–101.0)
MONO#: 0.4 10*3/uL (ref 0.1–0.9)
MONO%: 5.6 % (ref 0.0–14.0)
NEUT%: 66.7 % (ref 38.4–76.8)
NEUTROS ABS: 4.3 10*3/uL (ref 1.5–6.5)
PLATELETS: 240 10*3/uL (ref 145–400)
RBC: 4.52 10*6/uL (ref 3.70–5.45)
RDW: 14.4 % (ref 11.2–14.5)
WBC: 6.4 10*3/uL (ref 3.9–10.3)
lymph#: 1.6 10*3/uL (ref 0.9–3.3)

## 2015-03-18 MED ORDER — TAMOXIFEN CITRATE 20 MG PO TABS: 20.0000 mg | ORAL_TABLET | Freq: Every day | ORAL | Status: DC

## 2015-03-18 MED ORDER — DENOSUMAB 60 MG/ML ~~LOC~~ SOLN
60.0000 mg | Freq: Once | SUBCUTANEOUS | Status: AC
  Administered 2015-03-18: 60 mg via SUBCUTANEOUS
  Filled 2015-03-18: qty 1

## 2015-03-18 NOTE — Progress Notes (Signed)
Patient ID: Anita Randolph, female   DOB: 1950-12-20, 64 y.o.   MRN: [id number] ID: Anita Randolph   DOB: 1951-10-30  MR#: [id number]  ZRA#:[id number]  PCP: [name] GYN: M. Edwinna Areola SU: [[name]], [name] OTHER MD: Glori Bickers, Amy Martinique, [place]  CHIEF COMPLAINT: Estrogen receptor positive breast cancer  CURRENT TREATMENT: Tamoxifen   HISTORY OF PRESENT ILLNESS: From the original intake note:  [name] herself palpated a mass in her right breast 11/09/2012. She brought this to Dr. Julieta Bellini attention and mammography was obtained at [facility], (I do not have that report today) confirming a mass in the lower inner quadrant of the right breast. 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  Bilateral breast MRI 11/22/2012 showed a far posterior right breast mass measuring 3.2 cm with no discernible fat plane between the mass and the underlying pectoralis major. There was extension of irregular enhancement into the superficial aspect of the cholesterol is muscle underlying the mass, but there was no evidence for chest wall involvement, internal mammary artery chain or axillary lymph node involvement, or anything else in the breast or the contralateral breast.  The patient's subsequent history is as detailed below  INTERVAL HISTORY: [name] returns today for followup of her breast cancer. Since her last visit here she retired. She is doing "great". 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 Her eldest child is going to get married in October so there is who 4 grandchildren at some point. She is tolerating tamoxifen with absolutely no side effects that she is  aware of.  REVIEW OF SYSTEMS: A detailed review of systems today was entirely negative.   PAST MEDICAL HISTORY: Past Medical History  Diagnosis Date  . Cancer     breast  . Breast cancer 11/15/12    Right invasive ductal ca  . Neuromuscular disorder     tingling hands and feet from chemo-getting better  . GERD (gastroesophageal reflux disease)     only during chemotherapy  . Hypertension   . Radiation 06/06/13-07/22/13    Right breast  . Dysplastic nevus 11/2009    -w/atypia right labia majora  . Fibroid 2007    largest 5x5x5cm  . Microscopic hematuria     negative w/u  . Anemia     s/p chemotherapy  . Osteopenia due to cancer therapy     PAST SURGICAL HISTORY: Past Surgical History  Procedure Laterality Date  . Portacath placement  12/20/2012    Procedure: INSERTION PORT-A-CATH;  Surgeon: Haywood Lasso, MD;  Location: [facility];  Service: General;  Laterality: N/A;  . Tonsillectomy    . Arm skin lesion biopsy / excision  2011    nodular faciitis lt arm  . Colonoscopy    . Breast lumpectomy with needle localization and axillary sentinel lymph node bx Right 05/07/2013    Procedure: NEEDLE LOCALIZATION RIGHT BREAST LUMPECTOMY AND   SENTINEL LYMPH NODE  ;  Surgeon: Haywood Lasso, MD;  Location: [facility];  Service: General;  Laterality: Right;  . Breast surgery    . Lipoma excision      -right back(age 69)    FAMILY HISTORY Family History  Problem Relation Age of Onset  . Breast cancer Mother 76  . Osteoporosis Mother   . Diabetes Mother   . Thyroid disease Mother   . Heart disease Father   . Bladder Cancer Father 69  . Breast cancer Maternal Aunt     diagnosed in her 30s  . Multiple sclerosis Paternal Aunt   . Heart disease Maternal Grandmother   . Diabetes Maternal Grandmother   . Mental retardation Cousin     maternal cousins; brothers, sons of aunt with breast cancer   the patient's father immigrated from [place] 1938, living in [place]  for about 10 years. He had significant coronary artery disease, bladder cancer, and a ruptured aneurysm, but lived to be 40. The patient's mother is living at age 10. The patient has one brother, no sisters. The patient's mother had breast cancer diagnosed at age 44. The patient is of a Ashkenazi Jewish descent. She will be meeting with our genetics counselor today  GYNECOLOGIC HISTORY: Menarche age 7, first live birth age 2, she is [place] P2, menopause 2006, received hormone replacement until approximately 2010.  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 Her husband [name] has a management position for [employer]. This involves a lot of traveling. Son [name] lives in [place] and is a sports Catering manager. Daughter [name] lives in [place], where she is a Sport and exercise psychologist in international developments and is also getting an MPH in Physicist, medical. The patient has no grandchildren.   ADVANCED DIRECTIVES: In place  HEALTH MAINTENANCE: History  Substance Use Topics  . Smoking status: Former Smoker -- 1.00 packs/day for 4 years    Types: Cigarettes    Quit date: 11/30/1974  . Smokeless tobacco: Never Used  . Alcohol Use: 1.0 oz/week    2 drink(s) per week     Comment: 1-2 glasses of wine per week   [name] exercises 3-4 times a week, doing weights, walking, and other structures exercises.   Colonoscopy: 2009/ [facility]  PAP: 2013  Bone density: AUG 2014 shows T -1.9  Lipid panel:  No Known Allergies  Current Outpatient Prescriptions  Medication Sig Dispense Refill  . calcium-vitamin D (OSCAL WITH D) 500-200 MG-UNIT per tablet Take 1 tablet by mouth 2 (two) times daily. 600 vit d    . simvastatin (ZOCOR) 20 MG tablet Take 20 mg by mouth daily.    . tamoxifen (NOLVADEX) 20 MG tablet Take 1 tablet (20 mg total) by mouth daily. 90 tablet 4   No current facility-administered medications for this visit.     OBJECTIVE: Middle-aged white woman who looks well Filed Vitals:   03/18/15 1323  BP: 145/71  Pulse: 85  Temp: 98.1 F (36.7 C)  Resp: 18     Body mass index is 28.94 kg/(m^2).    ECOG FS: 0 Filed Weights   03/18/15 1323  Weight: 168 lb 11.2 oz (76.522 kg)   Sclerae unicteric, EOMs intact Oropharynx clear, dentition in good repair No cervical or supraclavicular adenopathy Lungs no rales or rhonchi Heart regular rate and rhythm Abd soft, nontender, positive bowel sounds MSK no focal spinal tenderness, no upper extremity lymphedema Neuro: nonfocal, well oriented, appropriate affect Breasts: The right breast is status post lumpectomy and radiation. There is no evidence of local recurrence. The left breast is unremarkable.    LAB RESULTS: CBC    Component Value Date/Time   WBC 6.4 03/18/2015 1313   RBC 4.52  03/18/2015 1313   HGB 13.1 03/18/2015 1313   HCT 39.2 03/18/2015 1313   PLT 240 03/18/2015 1313   MCV 86.7 03/18/2015 1313   MCH 29.0 03/18/2015 1313   MCHC 33.4 03/18/2015 1313   RDW 14.4 03/18/2015 1313   LYMPHSABS 1.6 03/18/2015 1313   MONOABS 0.4 03/18/2015 1313   EOSABS 0.2 03/18/2015 1313   BASOSABS 0.0 03/18/2015 1313       Chemistry      Component Value Date/Time   NA 142 09/10/2014 0815   K 4.0 09/10/2014 0815   CL 109* 05/02/2013 1043   CO2 24 09/10/2014 0815   BUN 16.9 09/10/2014 0815   BUN 15 08/23/2010 1350   CREATININE 1.0 09/10/2014 0815   CREATININE 1.00 08/23/2010 1350      Component Value Date/Time   CALCIUM 9.5 09/10/2014 0815        STUDIES:  CLINICAL DATA: History of right lumpectomy for breast cancer 2014. Asymptomatic.  EXAM: DIGITAL DIAGNOSTIC bilateral MAMMOGRAM WITH 3D TOMOSYNTHESIS AND CAD  COMPARISON: Prior exams  ACR Breast Density Category b: There are scattered areas of fibroglandular density.  FINDINGS: Right lumpectomy changes are reidentified. Left-sided Port-A-Cath no longer visualized. No new  suspicious abnormality is identified in either breast.  Mammographic images were processed with CAD.  IMPRESSION: No new suspicious abnormality in either breast to suggest malignancy.  RECOMMENDATION: Diagnostic mammogram is suggested in 1 year. (Code:DM-B-01Y)  I have discussed the findings and recommendations with the patient. Results were also provided in writing at the conclusion of the visit. If applicable, a reminder letter will be sent to the patient regarding the next appointment.  BI-RADS CATEGORY 2: Benign.   Electronically Signed  By: Conchita Paris M.D.  On: 02/16/2015 09:07     ASSESSMENT: 64 y.o. [place] woman   (1)  s/p right breast biopsy 11/15/2012 for a clinical T2 N0, stage IIA invasive ductal carcinoma, grade 3, with superficial involvement of the pectoralis muscle, estrogen receptor 29% positive, progesterone receptor negative, with an Mib-1 of 79% and HER-2 amplification by CISH with a ratio of 4.39  (2) treated in the neoadjuvant setting, completing 6 cycles of docetaxel/ carboplatin/ trastuzumab 04/11/2013  (3) trastuzumab continued for total of one year (through January 2015).-- Final echo 01/21/2014 showed a well-preserved ejecton fraction  (4) right lumpectomy and sentinel lymph node sampling 05/07/2013 showed a pathologic complete response  (5) adjuvant radiation completed 07/22/2013  (6) started tamoxifen 08/20/2013  (7) osteopenia-- T score -1.9 on August 2014; receiving denosumab twice a year starting April 2015  PLAN:  Ladon is doing terrific, now nearly 2 years out from her definitive surgery with no evidence of disease recurrence. She will complete 2 years of tamoxifen in September and I will see her at that time. She will have the option of switching to anastrozole.  Accordingly today I gave her information on the possible side effects, toxicities, and complications of the aromatase inhibitors. She has not in writing as  well as.  To help [place] with the decision she will have a bone density August 2016 at the [facility].  Rushie knows to call for any problems that may develop before her next visit here.   Garey Alleva C    03/18/2015

## 2015-03-18 NOTE — Telephone Encounter (Signed)
Appointments made and avs printed for patient °

## 2015-07-20 ENCOUNTER — Other Ambulatory Visit: Payer: BLUE CROSS/BLUE SHIELD

## 2015-07-28 ENCOUNTER — Ambulatory Visit
Admission: RE | Admit: 2015-07-28 | Discharge: 2015-07-28 | Disposition: A | Discharge status: Home / Self Care | Payer: BLUE CROSS/BLUE SHIELD | Source: Ambulatory Visit | Attending: Oncology | Admitting: Oncology

## 2015-07-28 DIAGNOSIS — C50511 Malignant neoplasm of lower-outer quadrant of right female breast: Secondary | ICD-10-CM

## 2015-07-28 DIAGNOSIS — M8580 Other specified disorders of bone density and structure, unspecified site: Secondary | ICD-10-CM

## 2015-08-10 ENCOUNTER — Other Ambulatory Visit (HOSPITAL_BASED_OUTPATIENT_CLINIC_OR_DEPARTMENT_OTHER): Payer: BLUE CROSS/BLUE SHIELD

## 2015-08-10 DIAGNOSIS — C50311 Malignant neoplasm of lower-inner quadrant of right female breast: Secondary | ICD-10-CM

## 2015-08-10 DIAGNOSIS — M8580 Other specified disorders of bone density and structure, unspecified site: Secondary | ICD-10-CM | POA: Diagnosis not present

## 2015-08-10 DIAGNOSIS — C50511 Malignant neoplasm of lower-outer quadrant of right female breast: Secondary | ICD-10-CM

## 2015-08-10 LAB — CBC WITH DIFFERENTIAL/PLATELET
BASO%: 0.6 % (ref 0.0–2.0)
BASOS ABS: 0 10*3/uL (ref 0.0–0.1)
EOS ABS: 0.2 10*3/uL (ref 0.0–0.5)
EOS%: 2.7 % (ref 0.0–7.0)
HCT: 39.7 % (ref 34.8–46.6)
HGB: 13 g/dL (ref 11.6–15.9)
LYMPH%: 24.5 % (ref 14.0–49.7)
MCH: 28.1 pg (ref 25.1–34.0)
MCHC: 32.7 g/dL (ref 31.5–36.0)
MCV: 85.9 fL (ref 79.5–101.0)
MONO#: 0.4 10*3/uL (ref 0.1–0.9)
MONO%: 5.6 % (ref 0.0–14.0)
NEUT#: 4.8 10*3/uL (ref 1.5–6.5)
NEUT%: 66.6 % (ref 38.4–76.8)
PLATELETS: 253 10*3/uL (ref 145–400)
RBC: 4.62 10*6/uL (ref 3.70–5.45)
RDW: 14 % (ref 11.2–14.5)
WBC: 7.1 10*3/uL (ref 3.9–10.3)
lymph#: 1.8 10*3/uL (ref 0.9–3.3)

## 2015-08-10 LAB — COMPREHENSIVE METABOLIC PANEL (CC13)
ALT: 16 U/L (ref 0–55)
AST: 21 U/L (ref 5–34)
Albumin: 3.7 g/dL (ref 3.5–5.0)
Alkaline Phosphatase: 41 U/L (ref 40–150)
Anion Gap: 7 mEq/L (ref 3–11)
BILIRUBIN TOTAL: 0.56 mg/dL (ref 0.20–1.20)
BUN: 18.9 mg/dL (ref 7.0–26.0)
CHLORIDE: 106 meq/L (ref 98–109)
CO2: 27 meq/L (ref 22–29)
CREATININE: 1.1 mg/dL (ref 0.6–1.1)
Calcium: 9.4 mg/dL (ref 8.4–10.4)
EGFR: 55 mL/min/{1.73_m2} — ABNORMAL LOW (ref >90)
Glucose: 90 mg/dl (ref 70–140)
Potassium: 4.1 mEq/L (ref 3.5–5.1)
Sodium: 140 mEq/L (ref 136–145)
TOTAL PROTEIN: 6.8 g/dL (ref 6.4–8.3)

## 2015-08-11 LAB — VITAMIN D 25 HYDROXY (VIT D DEFICIENCY, FRACTURES): VIT D 25 HYDROXY: 35 ng/mL (ref 30–100)

## 2015-08-16 NOTE — Progress Notes (Signed)
Patient ID: Anita Randolph, female   DOB: March 09, 1951, 64 y.o.   MRN: [id number] ID: [name]   DOB: [birth year]  MR#: [id number]  FKC#:[id number]  PCP: [name] GYN: M. Edwinna Areola SU: [[name]], [name] OTHER MD: Glori Bickers, Amy Martinique, [place]  CHIEF COMPLAINT: Estrogen receptor positive breast cancer  CURRENT TREATMENT: Tamoxifen   HISTORY OF PRESENT ILLNESS: From the original intake note:  Anita Randolph herself palpated a mass in her right breast 11/09/2012. She brought this to Dr. Julieta Bellini attention and mammography was obtained at [facility], (I do not have that report today) confirming a mass in the lower inner quadrant of the right breast. 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  Bilateral breast MRI 11/22/2012 showed a far posterior right breast mass measuring 3.2 cm with no discernible fat plane between the mass and the underlying pectoralis major. There was extension of irregular enhancement into the superficial aspect of the cholesterol is muscle underlying the mass, but there was no evidence for chest wall involvement, internal mammary artery chain or axillary lymph node involvement, or anything else in the breast or the contralateral breast.  The patient's subsequent history is as detailed below  INTERVAL HISTORY: Anita Randolph returns today for followup of her breast cancer. The interval history is unremarkable. She retired late last year. She is enjoying having her own time. Her son in [place] is going to get married to a young woman from [place] that Anita Randolph likes quite a bit. Anita Randolph goes to the gym 3 times a week. She continues on tamoxifen, with no side effects whatsoever.  REVIEW OF SYSTEMS: She was running but then started developing some low back and hip pain and  stopped. Even though she stopped running she still has the low back pain in the hip pain. It is not very intense and not constant but fairly persistent. She doesn't take any medication for that. A detailed review of systems today was otherwise entirely negative.   PAST MEDICAL HISTORY: Past Medical History  Diagnosis Date  . Cancer     breast  . Breast cancer 11/15/12    Right invasive ductal ca  . Neuromuscular disorder     tingling hands and feet from chemo-getting better  . GERD (gastroesophageal reflux disease)     only during chemotherapy  . Hypertension   . Radiation 06/06/13-07/22/13    Right breast  . Dysplastic nevus 11/2009    -w/atypia right labia majora  . Fibroid 2007    largest 5x5x5cm  . Microscopic hematuria     negative w/u  . Anemia     s/p chemotherapy  . Osteopenia due to cancer therapy     PAST SURGICAL HISTORY: Past Surgical History  Procedure Laterality Date  . Portacath placement  12/20/2012    Procedure: INSERTION PORT-A-CATH;  Surgeon: Haywood Lasso, MD;  Location: [place];  Service: General;  Laterality: N/A;  . Tonsillectomy    . Arm skin lesion biopsy / excision  2011    nodular faciitis lt arm  . Colonoscopy    . Breast lumpectomy with needle localization and axillary sentinel lymph node bx Right 05/07/2013    Procedure: NEEDLE LOCALIZATION RIGHT BREAST LUMPECTOMY AND   SENTINEL LYMPH NODE  ;  Surgeon: Haywood Lasso, MD;  Location: [facility];  Service: General;  Laterality: Right;  . Breast surgery    . Lipoma excision      -right back(age 49)    FAMILY HISTORY Family History  Problem Relation Age of Onset  . Breast cancer Mother 66  . Osteoporosis Mother   . Diabetes Mother   . Thyroid disease Mother   . Heart disease Father   . Bladder Cancer Father 54  . Breast cancer Maternal Aunt     diagnosed in her 62s  . Multiple sclerosis Paternal Aunt   . Heart disease Maternal Grandmother   . Diabetes Maternal  Grandmother   . Mental retardation Cousin     maternal cousins; brothers, sons of aunt with breast cancer   the patient's father immigrated from [place] 1938, living in [place] for about 10 years. He had significant coronary artery disease, bladder cancer, and a ruptured aneurysm, but lived to be 56. The patient's mother is living at age 24. The patient has one brother, no sisters. The patient's mother had breast cancer diagnosed at age 83. The patient is of a Ashkenazi Jewish descent. She will be meeting with our genetics counselor today  GYNECOLOGIC HISTORY: Menarche age 7, first live birth age 39, she is [place] P2, menopause 2006, received hormone replacement until approximately 2010.  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 Her husband [name] has a management position for [employer]. This involves a lot of traveling. Son [name] lives in [place] and is a sports Catering manager; he will marry Estill Bamberg) in [place] 2016. Daughter [name] lives in [place], where she is a Sport and exercise psychologist in international developments and is also getting an MPH in Physicist, medical. The patient has no grandchildren.   ADVANCED DIRECTIVES: In place  HEALTH MAINTENANCE: Social History  Substance Use Topics  . Smoking status: Former Smoker -- 1.00 packs/day for 4 years    Types: Cigarettes    Quit date: 11/30/1974  . Smokeless tobacco: Never Used  . Alcohol Use: 1.0 oz/week    2 drink(s) per week     Comment: 1-2 glasses of wine per week   Anita Randolph exercises 3-4 times a week, doing weights, walking, and other structures exercises.   Colonoscopy: 2009/ [facility]  PAP: 2013  Bone density: AUG 2014 shows T -1.9  Lipid panel:  No Known Allergies  Current Outpatient Prescriptions  Medication Sig Dispense Refill  . calcium-vitamin D (OSCAL WITH D) 500-200 MG-UNIT per tablet Take 1 tablet by mouth 2 (two) times daily. 600 vit d    .  simvastatin (ZOCOR) 20 MG tablet Take 20 mg by mouth daily.    . tamoxifen (NOLVADEX) 20 MG tablet Take 1 tablet (20 mg total) by mouth daily. 90 tablet 4   No current facility-administered medications for this visit.    OBJECTIVE: Middle-aged white woman in no acute distress Filed Vitals:   08/17/15 1100  BP: 122/75  Pulse: 93  Temp: 98 F (36.7 C)  Resp: 18     Body mass index is 28.81 kg/(m^2).    ECOG FS: 0 Filed Weights   08/17/15 1100  Weight: 167 lb 14.4 oz (76.159 kg)   Sclerae unicteric, pupils round and equal Oropharynx clear and moist-- no thrush or other lesions No cervical or supraclavicular adenopathy Lungs no rales or rhonchi Heart regular rate and rhythm Abd soft, nontender, positive bowel sounds MSK no focal spinal tenderness to vigorous percussion, no upper extremity lymphedema Neuro: nonfocal,  well oriented, appropriate affect Breasts: The right breast is status post lumpectomy and radiation. There is no evidence of local recurrence. The cosmetic result is excellent. The right axilla is benign. The left breast is unremarkable    LAB RESULTS: CBC    Component Value Date/Time   WBC 7.1 08/10/2015 0916   RBC 4.62 08/10/2015 0916   HGB 13.0 08/10/2015 0916   HCT 39.7 08/10/2015 0916   PLT 253 08/10/2015 0916   MCV 85.9 08/10/2015 0916   MCH 28.1 08/10/2015 0916   MCHC 32.7 08/10/2015 0916   RDW 14.0 08/10/2015 0916   LYMPHSABS 1.8 08/10/2015 0916   MONOABS 0.4 08/10/2015 0916   EOSABS 0.2 08/10/2015 0916   BASOSABS 0.0 08/10/2015 0916       Chemistry      Component Value Date/Time   NA 140 08/10/2015 0916   K 4.1 08/10/2015 0916   CL 109* 05/02/2013 1043   CO2 27 08/10/2015 0916   BUN 18.9 08/10/2015 0916   BUN 15 08/23/2010 1350   CREATININE 1.1 08/10/2015 0916   CREATININE 1.00 08/23/2010 1350      Component Value Date/Time   CALCIUM 9.4 08/10/2015 0916        STUDIES:    CLINICAL DATA: 64 year old postmenopausal Caucasian  female with estrogen deficiency and history of low bone mass. Currently on calcium and vitamin-D supplementation and Prolia.  EXAM: DUAL X-RAY ABSORPTIOMETRY (DXA) FOR BONE MINERAL DENSITY  FINDINGS: AP LUMBAR SPINE L1-L4  Bone Mineral Density (BMD): 1.010 g/cm2  Young Adult T-Score: -0.3  Z-Score: 1.3  LEFT FEMUR NECK  Bone Mineral Density (BMD): 0.694 g/cm2  Young Adult T-Score: -1.4  Z-Score: 0.0  ASSESSMENT: Patient's diagnostic category is LOW BONE MASS by WHO Criteria.  FRACTURE RISK: UNKNOWN as the patient is on bone building therapy.  FRAX: World Health Organization FRAX assessment of absolute fracture risk is not calculated for this patient because the patient is currently on bone building therapy.  COMPARISON: 07/17/2013. Since the prior study, there has been a significant INCREASE in bone mineral density of the lumbar spine (+11.2% with a BMD change of 0.102) and of the left hip (+14.8% with a BMD change of 0.110).    ASSESSMENT: 64 y.o. [place] woman   (1)  s/p right breast biopsy 11/15/2012 for a clinical T2 N0, stage IIA invasive ductal carcinoma, grade 3, with superficial involvement of the pectoralis muscle, estrogen receptor 29% positive, progesterone receptor negative, with an Mib-1 of 79% and HER-2 amplification by CISH with a ratio of 4.39  (2) treated in the neoadjuvant setting, completing 6 cycles of docetaxel/ carboplatin/ trastuzumab 04/11/2013  (3) trastuzumab continued for total of one year (through January 2015).-- Final echo 01/21/2014 showed a well-preserved ejecton fraction  (4) right lumpectomy and sentinel lymph node sampling 05/07/2013 showed a pathologic complete response  (5) adjuvant radiation completed 07/22/2013  (6) started tamoxifen 08/20/2013  (7) osteopenia-- T score -1.9 on August 2014;   (a)  denosumab/Prolia twice a year started April 2015  (b) repeat bone density study 07/28/2015 shows an  improved T score at -1.4  PLAN:  Sheanna has tolerated tamoxifen excellently well, and of course it has the advantage that we can deal with the vaginal dryness issue by using estrogens. The problem of course that she really doesn't want to take a pill for 10 years so she is attracted to the idea of switching to anastrozole.  We discussed the possible toxicities, side effects and complications of anastrozole. As  far as the bone density issues concern, of course, we're continuing the denosumab every 6 months so that we'll take care of that problem.  We decided she will stop the tamoxifen October 1 and start the anastrozole November 1. She will see me again in February to see how she is tolerating it. If it is a major problem we will go back to the tamoxifen and decide on the 10 year question after she has been on it for 5 years  As far as her back and hip pain I do think she is experiencing some arthritis and musculoskeletal symptoms. 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 I'm setting her up for plain films of the thoracolumbar spine today which she can use to plan her exercise routines to minimize problems and optimize muscle balance.  Finally she is having some reflux symptoms. We discussed what to do about that  As far as breast cancer is concerned, she has an excellent prognosis. She will let me know if any problems develop before her next visit here.    MAGRINAT,GUSTAV C    08/17/2015

## 2015-08-17 ENCOUNTER — Ambulatory Visit (HOSPITAL_COMMUNITY)
Admission: RE | Admit: 2015-08-17 | Discharge: 2015-08-17 | Disposition: A | Discharge status: Home / Self Care | Payer: BLUE CROSS/BLUE SHIELD | Source: Ambulatory Visit | Attending: Oncology | Admitting: Oncology

## 2015-08-17 ENCOUNTER — Ambulatory Visit (HOSPITAL_BASED_OUTPATIENT_CLINIC_OR_DEPARTMENT_OTHER): Payer: BLUE CROSS/BLUE SHIELD | Admitting: Oncology

## 2015-08-17 ENCOUNTER — Telehealth: Payer: Self-pay | Admitting: Oncology

## 2015-08-17 VITALS — BP 122/75 | HR 93 | Temp 98.0°F | Resp 18 | Ht 64.0 in | Wt 167.9 lb

## 2015-08-17 DIAGNOSIS — M47814 Spondylosis without myelopathy or radiculopathy, thoracic region: Secondary | ICD-10-CM | POA: Diagnosis not present

## 2015-08-17 DIAGNOSIS — M5136 Other intervertebral disc degeneration, lumbar region: Secondary | ICD-10-CM | POA: Diagnosis not present

## 2015-08-17 DIAGNOSIS — C50511 Malignant neoplasm of lower-outer quadrant of right female breast: Secondary | ICD-10-CM

## 2015-08-17 DIAGNOSIS — M8580 Other specified disorders of bone density and structure, unspecified site: Secondary | ICD-10-CM | POA: Diagnosis not present

## 2015-08-17 MED ORDER — ANASTROZOLE 1 MG PO TABS: 1.0000 mg | ORAL_TABLET | Freq: Every day | ORAL | Status: DC

## 2015-08-17 NOTE — Telephone Encounter (Signed)
Appointments made and avs pritned for patient °

## 2015-08-18 ENCOUNTER — Encounter: Payer: Self-pay | Admitting: Oncology

## 2015-09-24 ENCOUNTER — Ambulatory Visit (HOSPITAL_BASED_OUTPATIENT_CLINIC_OR_DEPARTMENT_OTHER): Payer: BLUE CROSS/BLUE SHIELD

## 2015-09-24 VITALS — BP 125/80 | HR 96 | Temp 98.2°F

## 2015-09-24 DIAGNOSIS — M8580 Other specified disorders of bone density and structure, unspecified site: Secondary | ICD-10-CM | POA: Diagnosis not present

## 2015-09-24 MED ORDER — DENOSUMAB 60 MG/ML ~~LOC~~ SOLN
60.0000 mg | Freq: Once | SUBCUTANEOUS | Status: AC
  Administered 2015-09-24: 60 mg via SUBCUTANEOUS
  Filled 2015-09-24: qty 1

## 2015-11-13 DIAGNOSIS — R319 Hematuria, unspecified: Secondary | ICD-10-CM | POA: Insufficient documentation

## 2015-11-25 ENCOUNTER — Encounter: Payer: Self-pay | Admitting: Oncology

## 2016-01-05 ENCOUNTER — Other Ambulatory Visit (HOSPITAL_BASED_OUTPATIENT_CLINIC_OR_DEPARTMENT_OTHER): Payer: Managed Care, Other (non HMO)

## 2016-01-05 DIAGNOSIS — M8580 Other specified disorders of bone density and structure, unspecified site: Secondary | ICD-10-CM

## 2016-01-05 DIAGNOSIS — C50511 Malignant neoplasm of lower-outer quadrant of right female breast: Secondary | ICD-10-CM | POA: Diagnosis not present

## 2016-01-05 LAB — CBC WITH DIFFERENTIAL/PLATELET
BASO%: 1.1 % (ref 0.0–2.0)
BASOS ABS: 0.1 10*3/uL (ref 0.0–0.1)
EOS ABS: 0.2 10*3/uL (ref 0.0–0.5)
EOS%: 3.3 % (ref 0.0–7.0)
HEMATOCRIT: 42.1 % (ref 34.8–46.6)
HGB: 13.9 g/dL (ref 11.6–15.9)
LYMPH#: 1.8 10*3/uL (ref 0.9–3.3)
LYMPH%: 28.2 % (ref 14.0–49.7)
MCH: 28 pg (ref 25.1–34.0)
MCHC: 33.1 g/dL (ref 31.5–36.0)
MCV: 84.6 fL (ref 79.5–101.0)
MONO#: 0.4 10*3/uL (ref 0.1–0.9)
MONO%: 6.5 % (ref 0.0–14.0)
NEUT#: 4 10*3/uL (ref 1.5–6.5)
NEUT%: 60.9 % (ref 38.4–76.8)
PLATELETS: 260 10*3/uL (ref 145–400)
RBC: 4.97 10*6/uL (ref 3.70–5.45)
RDW: 14.6 % — AB (ref 11.2–14.5)
WBC: 6.5 10*3/uL (ref 3.9–10.3)

## 2016-01-05 LAB — COMPREHENSIVE METABOLIC PANEL
ALBUMIN: 3.7 g/dL (ref 3.5–5.0)
ALK PHOS: 42 U/L (ref 40–150)
ALT: 21 U/L (ref 0–55)
ANION GAP: 9 meq/L (ref 3–11)
AST: 22 U/L (ref 5–34)
BUN: 15.1 mg/dL (ref 7.0–26.0)
CALCIUM: 9.3 mg/dL (ref 8.4–10.4)
CHLORIDE: 107 meq/L (ref 98–109)
CO2: 26 mEq/L (ref 22–29)
CREATININE: 1.1 mg/dL (ref 0.6–1.1)
EGFR: 56 mL/min/{1.73_m2} — ABNORMAL LOW (ref >90)
Glucose: 106 mg/dl (ref 70–140)
POTASSIUM: 4.3 meq/L (ref 3.5–5.1)
Sodium: 142 mEq/L (ref 136–145)
Total Bilirubin: 0.71 mg/dL (ref 0.20–1.20)
Total Protein: 7.2 g/dL (ref 6.4–8.3)

## 2016-01-06 LAB — VITAMIN D 25 HYDROXY (VIT D DEFICIENCY, FRACTURES): Vitamin D, 25-Hydroxy: 27.4 ng/mL — ABNORMAL LOW (ref 30.0–100.0)

## 2016-01-19 ENCOUNTER — Other Ambulatory Visit: Payer: Self-pay | Admitting: Oncology

## 2016-01-19 ENCOUNTER — Ambulatory Visit (HOSPITAL_BASED_OUTPATIENT_CLINIC_OR_DEPARTMENT_OTHER): Payer: Managed Care, Other (non HMO) | Admitting: Oncology

## 2016-01-19 ENCOUNTER — Telehealth: Payer: Self-pay | Admitting: Oncology

## 2016-01-19 ENCOUNTER — Other Ambulatory Visit: Payer: Self-pay | Admitting: *Deleted

## 2016-01-19 VITALS — BP 126/90 | HR 80 | Temp 97.4°F | Resp 18 | Ht 64.0 in | Wt 165.7 lb

## 2016-01-19 DIAGNOSIS — Z853 Personal history of malignant neoplasm of breast: Secondary | ICD-10-CM

## 2016-01-19 DIAGNOSIS — C50511 Malignant neoplasm of lower-outer quadrant of right female breast: Secondary | ICD-10-CM

## 2016-01-19 DIAGNOSIS — E559 Vitamin D deficiency, unspecified: Secondary | ICD-10-CM

## 2016-01-19 DIAGNOSIS — M810 Age-related osteoporosis without current pathological fracture: Secondary | ICD-10-CM | POA: Diagnosis not present

## 2016-01-19 MED ORDER — VITAMIN D 1000 UNITS PO TABS: 1000.0000 [IU] | ORAL_TABLET | Freq: Every day | ORAL | Status: DC

## 2016-01-19 MED ORDER — TAMOXIFEN CITRATE 20 MG PO TABS
20.0000 mg | ORAL_TABLET | Freq: Every day | ORAL | Status: AC
Start: 2016-01-19 — End: 2016-02-18

## 2016-01-19 MED ORDER — TAMOXIFEN CITRATE 20 MG PO TABS: 20.0000 mg | ORAL_TABLET | Freq: Every day | ORAL | Status: DC

## 2016-01-19 NOTE — Progress Notes (Signed)
Patient ID: Anita Randolph, female   DOB: [birth year], 65 y.o.   MRN: [id number] ID: Anita Randolph   DOB: 01-01-1951  MR#: [id number]  QBH#:[id number]  PCP: [name] GYN: Carilyn Goodpasture SU: Gerald Stabs Streck], [name] OTHER MD: Glori Bickers, Amy Martinique, [place]  CHIEF COMPLAINT: Estrogen receptor positive breast cancer  CURRENT TREATMENT: Tamoxifen   HISTORY OF PRESENT ILLNESS: From the original intake note:  Toniann herself palpated a mass in her right breast 11/09/2012. She brought this to Dr. Julieta Bellini attention and mammography was obtained at [facility], (I do not have that report today) confirming a mass in the lower inner quadrant of the right breast. 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  Bilateral breast MRI 11/22/2012 showed a far posterior right breast mass measuring 3.2 cm with no discernible fat plane between the mass and the underlying pectoralis major. There was extension of irregular enhancement into the superficial aspect of the cholesterol is muscle underlying the mass, but there was no evidence for chest wall involvement, internal mammary artery chain or axillary lymph node involvement, or anything else in the breast or the contralateral breast.  The patient's subsequent history is as detailed below  INTERVAL HISTORY: Anita Randolph returns today for followup of her estrogen receptor positive breast cancer. She went off tamoxifen in the fall of 2016, and started anastrozole early October. After 2 months she had significant aches and pains everywhere, including the lower back, the hips, legs, shoulders, all the way down to her fingertips. She called [place] and we asked her to go off the medication which she didn't December. Over the next 2-3 weeks the symptoms faded away. She is now ready  to start a different antiestrogen.  REVIEW OF SYSTEMS: She the unfortunate news is that her 70 year old mother is now under the care of hospice chiefly for heart problems it sounds like a difficult situation has been made as good as possible, with 24-hour care and lots of visits from the children and grandchildren. Anita Randolph herself is not exercising quite as often as before for that reason but she generally has a trainer 3 times a week and then walks at least twice a week. A detailed review of systems today was otherwise noncontributory  PAST MEDICAL HISTORY: Past Medical History  Diagnosis Date  . Cancer     breast  . Breast cancer 11/15/12    Right invasive ductal ca  . Neuromuscular disorder     tingling hands and feet from chemo-getting better  . GERD (gastroesophageal reflux disease)     only during chemotherapy  . Hypertension   . Radiation 06/06/13-07/22/13    Right breast  . Dysplastic nevus 11/2009    -w/atypia right labia majora  . Fibroid 2007    largest 5x5x5cm  . Microscopic hematuria     negative w/u  . Anemia     s/p chemotherapy  . Osteopenia due to cancer therapy     PAST SURGICAL HISTORY: Past Surgical History  Procedure Laterality Date  . Portacath placement  12/20/2012    Procedure: INSERTION PORT-A-CATH;  Surgeon: Haywood Lasso, MD;  Location: [place];  Service: General;  Laterality: N/A;  . Tonsillectomy    . Arm skin lesion biopsy / excision  2011    nodular faciitis lt arm  . Colonoscopy    .  Breast lumpectomy with needle localization and axillary sentinel lymph node bx Right 05/07/2013    Procedure: NEEDLE LOCALIZATION RIGHT BREAST LUMPECTOMY AND   SENTINEL LYMPH NODE  ;  Surgeon: Haywood Lasso, MD;  Location: [facility] [place];  Service: General;  Laterality: Right;  . Breast surgery    . Lipoma excision      -right back(age 40)    FAMILY HISTORY Family History  Problem Relation Age of Onset  . Breast cancer Mother 63  . Osteoporosis  Mother   . Diabetes Mother   . Thyroid disease Mother   . Heart disease Father   . Bladder Cancer Father 42  . Breast cancer Maternal Aunt     diagnosed in her 73s  . Multiple sclerosis Paternal Aunt   . Heart disease Maternal Grandmother   . Diabetes Maternal Grandmother   . Mental retardation Cousin     maternal cousins; brothers, sons of aunt with breast cancer   the patient's father immigrated from [place] 1938, living in [place] for about 10 years. He had significant coronary artery disease, bladder cancer, and a ruptured aneurysm, but lived to be 72. The patient's mother is living at age 25. The patient has one brother, no sisters. The patient's mother had breast cancer diagnosed at age 53. The patient is of a Ashkenazi Jewish descent. She will be meeting with our genetics counselor today  GYNECOLOGIC HISTORY: Menarche age 40, first live birth age 60, she is [place] P2, menopause 2006, received hormone replacement until approximately 2010.  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 Her husband [name] has a management position for [employer]. This involves a lot of traveling. Son Anita Randolph lives in [place] and is a sports Catering manager; he will marry Anita Randolph) in [place] 2016. Daughter [name] lives in [place], where she is a Sport and exercise psychologist in international developments and is also getting an MPH in Physicist, medical. The patient has no grandchildren.   ADVANCED DIRECTIVES: In place  HEALTH MAINTENANCE: Social History  Substance Use Topics  . Smoking status: Former Smoker -- 1.00 packs/day for 4 years    Types: Cigarettes    Quit date: 11/30/1974  . Smokeless tobacco: Never Used  . Alcohol Use: 1.0 oz/week    2 drink(s) per week     Comment: 1-2 glasses of wine per week   [name] exercises 3-4 times a week, doing weights, walking, and other structures exercises.   Colonoscopy: 2009/ [facility]  PAP: 2013  Bone  density: AUG 2016 shows think osteopeniad , Improved   No Known Allergies  Current Outpatient Prescriptions  Medication Sig Dispense Refill  . anastrozole (ARIMIDEX) 1 MG tablet Take 1 tablet (1 mg total) by mouth daily. 90 tablet 4  . calcium-vitamin D (OSCAL WITH D) 500-200 MG-UNIT per tablet Take 1 tablet by mouth 2 (two) times daily. 600 vit d    . simvastatin (ZOCOR) 20 MG tablet Take 20 mg by mouth daily.     No current facility-administered medications for this visit.    OBJECTIVE: Middle-aged white woman who appears well  Filed Vitals:   01/19/16 1441  BP: 126/90  Pulse: 80  Temp: 97.4 F (36.3 C)  Resp: 18     Body mass index is 28.43 kg/(m^2).    ECOG FS: 0 Filed Weights   01/19/16 1441  Weight: 165 lb 11.2 oz (75.161 kg)   Sclerae unicteric, EOMs intact Oropharynx clear, dentition  in good repair No cervical or supraclavicular adenopathy Lungs no rales or rhonchi Heart regular rate and rhythm Abd soft, nontender, positive bowel sounds MSK no focal spinal tenderness, no upper extremity lymphedema Neuro: nonfocal, well oriented, appropriate affect Breasts: The right breast is status post lumpectomy and radiation. The cosmetic result is excellent. There is no evidence of local recurrence. The right axilla is benign. The left breast is unremarkable.     LAB RESULTS: CBC    Component Value Date/Time   WBC 6.5 01/05/2016 0816   RBC 4.97 01/05/2016 0816   HGB 13.9 01/05/2016 0816   HCT 42.1 01/05/2016 0816   PLT 260 01/05/2016 0816   MCV 84.6 01/05/2016 0816   MCH 28.0 01/05/2016 0816   MCHC 33.1 01/05/2016 0816   RDW 14.6* 01/05/2016 0816   LYMPHSABS 1.8 01/05/2016 0816   MONOABS 0.4 01/05/2016 0816   EOSABS 0.2 01/05/2016 0816   BASOSABS 0.1 01/05/2016 0816       Chemistry      Component Value Date/Time   NA 142 01/05/2016 0816   K 4.3 01/05/2016 0816   CL 109* 05/02/2013 1043   CO2 26 01/05/2016 0816   BUN 15.1 01/05/2016 0816   BUN 15  08/23/2010 1350   CREATININE 1.1 01/05/2016 0816   CREATININE 1.00 08/23/2010 1350      Component Value Date/Time   CALCIUM 9.3 01/05/2016 0816        STUDIES: No results found.   ASSESSMENT: 65 y.o. [place] woman   (1)  s/p right breast biopsy 11/15/2012 for a clinical T2 N0, stage IIA invasive ductal carcinoma, grade 3, with superficial involvement of the pectoralis muscle, estrogen receptor 29% positive, progesterone receptor negative, with an Mib-1 of 79% and HER-2 amplification by CISH with a ratio of 4.39  (2) treated in the neoadjuvant setting, completing 6 cycles of docetaxel/ carboplatin/ trastuzumab 04/11/2013  (3) trastuzumab continued for total of one year (through January 2015).-- Final echo 01/21/2014 showed a well-preserved ejecton fraction  (4) right lumpectomy and sentinel lymph node sampling 05/07/2013 showed a pathologic complete response  (5) adjuvant radiation completed 07/22/2013  (6) started tamoxifen 08/20/2013, discontinued December 2016 with poor tolerance  (7) osteopenia-- T score -1.9 on August 2014;   (a)  denosumab/Prolia twice a year started April 2015  (b) repeat bone density study 07/28/2015 shows an improved T score at -1.4  (8) tamoxifen resumed February 2017  PLAN:  Tereasa gave the anastrozole group try but really was not able to tolerate it. The arthralgias and myalgias that she experienced are classic and a clear a few weeks after going off the drug. I think it would be most reasonable to go back on tamoxifen and continue at least to a total of 5 years, after which we can decide if she wants to extend that or not.  Her vitamin D level is marginally low. I am starting her on thousand units a day of vitamin D in addition to her prior supplementation  We are continuing the Prolia at least another year. She will be due for repeat bone density August 2018 and depending on those results we could stop at that time.  At this point I'm  comfortable seeing her on a once a year basis and since she gets her mammograms in March I will see her late March 2018.  She knows to call for any problems that may develop before her next visit here.   Timiyah Romito C    01/19/2016

## 2016-01-19 NOTE — Telephone Encounter (Signed)
appt made and avs printed °

## 2016-02-17 ENCOUNTER — Other Ambulatory Visit: Payer: Self-pay | Admitting: Oncology

## 2016-02-17 ENCOUNTER — Other Ambulatory Visit: Payer: Self-pay

## 2016-02-17 DIAGNOSIS — Z853 Personal history of malignant neoplasm of breast: Secondary | ICD-10-CM

## 2016-02-18 ENCOUNTER — Ambulatory Visit
Admission: RE | Admit: 2016-02-18 | Discharge: 2016-02-18 | Disposition: A | Discharge status: Home / Self Care | Payer: Managed Care, Other (non HMO) | Source: Ambulatory Visit | Attending: Oncology | Admitting: Oncology

## 2016-03-02 ENCOUNTER — Encounter: Payer: Self-pay | Admitting: Oncology

## 2016-03-08 ENCOUNTER — Telehealth: Payer: Self-pay

## 2016-03-08 ENCOUNTER — Other Ambulatory Visit: Payer: Self-pay | Admitting: Oncology

## 2016-03-08 NOTE — Telephone Encounter (Signed)
Patient calling regarding preauth on Prolia.  Writer spoke with Charlena Cross and she did not see the order however was going to start to work on it.  MD aware and has placed the order for the prior auth.

## 2016-03-14 ENCOUNTER — Ambulatory Visit (HOSPITAL_BASED_OUTPATIENT_CLINIC_OR_DEPARTMENT_OTHER): Payer: Managed Care, Other (non HMO)

## 2016-03-14 ENCOUNTER — Other Ambulatory Visit (HOSPITAL_BASED_OUTPATIENT_CLINIC_OR_DEPARTMENT_OTHER): Payer: Managed Care, Other (non HMO)

## 2016-03-14 VITALS — BP 148/95 | HR 74 | Temp 97.9°F | Resp 17

## 2016-03-14 DIAGNOSIS — M8580 Other specified disorders of bone density and structure, unspecified site: Secondary | ICD-10-CM

## 2016-03-14 DIAGNOSIS — C50511 Malignant neoplasm of lower-outer quadrant of right female breast: Secondary | ICD-10-CM

## 2016-03-14 LAB — CBC WITH DIFFERENTIAL/PLATELET
BASO%: 1.3 % (ref 0.0–2.0)
BASOS ABS: 0.1 10*3/uL (ref 0.0–0.1)
EOS ABS: 0.2 10*3/uL (ref 0.0–0.5)
EOS%: 3.5 % (ref 0.0–7.0)
HEMATOCRIT: 40.2 % (ref 34.8–46.6)
HEMOGLOBIN: 13.3 g/dL (ref 11.6–15.9)
LYMPH#: 1.6 10*3/uL (ref 0.9–3.3)
LYMPH%: 27.7 % (ref 14.0–49.7)
MCH: 28 pg (ref 25.1–34.0)
MCHC: 33.2 g/dL (ref 31.5–36.0)
MCV: 84.4 fL (ref 79.5–101.0)
MONO#: 0.3 10*3/uL (ref 0.1–0.9)
MONO%: 5.5 % (ref 0.0–14.0)
NEUT#: 3.6 10*3/uL (ref 1.5–6.5)
NEUT%: 62 % (ref 38.4–76.8)
Platelets: 257 10*3/uL (ref 145–400)
RBC: 4.76 10*6/uL (ref 3.70–5.45)
RDW: 14.8 % — AB (ref 11.2–14.5)
WBC: 5.9 10*3/uL (ref 3.9–10.3)

## 2016-03-14 LAB — COMPREHENSIVE METABOLIC PANEL
ALBUMIN: 3.5 g/dL (ref 3.5–5.0)
ALT: 13 U/L (ref 0–55)
ANION GAP: 10 meq/L (ref 3–11)
AST: 20 U/L (ref 5–34)
Alkaline Phosphatase: 34 U/L — ABNORMAL LOW (ref 40–150)
BILIRUBIN TOTAL: 0.46 mg/dL (ref 0.20–1.20)
BUN: 16.8 mg/dL (ref 7.0–26.0)
CHLORIDE: 107 meq/L (ref 98–109)
CO2: 25 meq/L (ref 22–29)
Calcium: 9.1 mg/dL (ref 8.4–10.4)
Creatinine: 1.1 mg/dL (ref 0.6–1.1)
EGFR: 52 mL/min/{1.73_m2} — AB (ref >90)
GLUCOSE: 100 mg/dL (ref 70–140)
POTASSIUM: 4.3 meq/L (ref 3.5–5.1)
Sodium: 141 mEq/L (ref 136–145)
Total Protein: 6.9 g/dL (ref 6.4–8.3)

## 2016-03-14 MED ORDER — DENOSUMAB 60 MG/ML ~~LOC~~ SOLN
60.0000 mg | Freq: Once | SUBCUTANEOUS | Status: AC
  Administered 2016-03-14: 60 mg via SUBCUTANEOUS
  Filled 2016-03-14: qty 1

## 2016-03-14 NOTE — Patient Instructions (Signed)
Denosumab injection  What is this medicine?  DENOSUMAB (den oh sue mab) slows bone breakdown. Prolia is used to treat osteoporosis in women after menopause and in men. Xgeva is used to prevent bone fractures and other bone problems caused by cancer bone metastases. Xgeva is also used to treat giant cell tumor of the bone.  This medicine may be used for other purposes; ask your health care provider or pharmacist if you have questions.  What should I tell my health care provider before I take this medicine?  They need to know if you have any of these conditions:  -dental disease  -eczema  -infection or history of infections  -kidney disease or on dialysis  -low blood calcium or vitamin D  -malabsorption syndrome  -scheduled to have surgery or tooth extraction  -taking medicine that contains denosumab  -thyroid or parathyroid disease  -an unusual reaction to denosumab, other medicines, foods, dyes, or preservatives  -pregnant or trying to get pregnant  -breast-feeding  How should I use this medicine?  This medicine is for injection under the skin. It is given by a health care professional in a hospital or clinic setting.  If you are getting Prolia, a special MedGuide will be given to you by the pharmacist with each prescription and refill. Be sure to read this information carefully each time.  For Prolia, talk to your pediatrician regarding the use of this medicine in children. Special care may be needed. For Xgeva, talk to your pediatrician regarding the use of this medicine in children. While this drug may be prescribed for children as young as 13 years for selected conditions, precautions do apply.  Overdosage: If you think you have taken too much of this medicine contact a poison control center or emergency room at once.  NOTE: This medicine is only for you. Do not share this medicine with others.  What if I miss a dose?  It is important not to miss your dose. Call your doctor or health care professional if you are  unable to keep an appointment.  What may interact with this medicine?  Do not take this medicine with any of the following medications:  -other medicines containing denosumab  This medicine may also interact with the following medications:  -medicines that suppress the immune system  -medicines that treat cancer  -steroid medicines like prednisone or cortisone  This list may not describe all possible interactions. Give your health care provider a list of all the medicines, herbs, non-prescription drugs, or dietary supplements you use. Also tell them if you smoke, drink alcohol, or use illegal drugs. Some items may interact with your medicine.  What should I watch for while using this medicine?  Visit your doctor or health care professional for regular checks on your progress. Your doctor or health care professional may order blood tests and other tests to see how you are doing.  Call your doctor or health care professional if you get a cold or other infection while receiving this medicine. Do not treat yourself. This medicine may decrease your body's ability to fight infection.  You should make sure you get enough calcium and vitamin D while you are taking this medicine, unless your doctor tells you not to. Discuss the foods you eat and the vitamins you take with your health care professional.  See your dentist regularly. Brush and floss your teeth as directed. Before you have any dental work done, tell your dentist you are receiving this medicine.  Do   not become pregnant while taking this medicine or for 5 months after stopping it. Women should inform their doctor if they wish to become pregnant or think they might be pregnant. There is a potential for serious side effects to an unborn child. Talk to your health care professional or pharmacist for more information.  What side effects may I notice from receiving this medicine?  Side effects that you should report to your doctor or health care professional as soon as  possible:  -allergic reactions like skin rash, itching or hives, swelling of the face, lips, or tongue  -breathing problems  -chest pain  -fast, irregular heartbeat  -feeling faint or lightheaded, falls  -fever, chills, or any other sign of infection  -muscle spasms, tightening, or twitches  -numbness or tingling  -skin blisters or bumps, or is dry, peels, or red  -slow healing or unexplained pain in the mouth or jaw  -unusual bleeding or bruising  Side effects that usually do not require medical attention (Report these to your doctor or health care professional if they continue or are bothersome.):  -muscle pain  -stomach upset, gas  This list may not describe all possible side effects. Call your doctor for medical advice about side effects. You may report side effects to FDA at 1-800-FDA-1088.  Where should I keep my medicine?  This medicine is only given in a clinic, doctor's office, or other health care setting and will not be stored at home.  NOTE: This sheet is a summary. It may not cover all possible information. If you have questions about this medicine, talk to your doctor, pharmacist, or health care provider.      2016, Elsevier/Gold Standard. (2012-05-14 12:37:47)

## 2016-05-24 ENCOUNTER — Other Ambulatory Visit: Payer: Self-pay | Admitting: Nurse Practitioner

## 2016-09-08 ENCOUNTER — Other Ambulatory Visit: Payer: Self-pay | Admitting: *Deleted

## 2016-09-08 ENCOUNTER — Encounter: Payer: Self-pay | Admitting: Oncology

## 2016-09-08 ENCOUNTER — Telehealth: Payer: Self-pay | Admitting: Oncology

## 2016-09-08 DIAGNOSIS — C50511 Malignant neoplasm of lower-outer quadrant of right female breast: Secondary | ICD-10-CM

## 2016-09-08 NOTE — Telephone Encounter (Signed)
Patient called to reschedule lab and injection appointment date. 09/08/16

## 2016-09-12 ENCOUNTER — Ambulatory Visit: Payer: Managed Care, Other (non HMO)

## 2016-09-12 ENCOUNTER — Other Ambulatory Visit: Payer: Managed Care, Other (non HMO)

## 2016-09-14 ENCOUNTER — Telehealth: Payer: Self-pay | Admitting: *Deleted

## 2016-09-14 NOTE — Telephone Encounter (Signed)
"  I need to know if the Prolia has been authorized yet."  Checked appointments, springboard reports and Treatment plan.  Green check mark present that reads authorized.  Next appointment information provided.  No further questions.

## 2016-09-19 ENCOUNTER — Other Ambulatory Visit (HOSPITAL_BASED_OUTPATIENT_CLINIC_OR_DEPARTMENT_OTHER): Payer: Managed Care, Other (non HMO)

## 2016-09-19 ENCOUNTER — Ambulatory Visit (HOSPITAL_BASED_OUTPATIENT_CLINIC_OR_DEPARTMENT_OTHER): Payer: Managed Care, Other (non HMO)

## 2016-09-19 VITALS — BP 129/77 | HR 77 | Temp 97.8°F | Resp 18

## 2016-09-19 DIAGNOSIS — C50311 Malignant neoplasm of lower-inner quadrant of right female breast: Secondary | ICD-10-CM

## 2016-09-19 DIAGNOSIS — M85879 Other specified disorders of bone density and structure, unspecified ankle and foot: Secondary | ICD-10-CM

## 2016-09-19 DIAGNOSIS — C50511 Malignant neoplasm of lower-outer quadrant of right female breast: Secondary | ICD-10-CM

## 2016-09-19 DIAGNOSIS — M8580 Other specified disorders of bone density and structure, unspecified site: Secondary | ICD-10-CM | POA: Diagnosis not present

## 2016-09-19 LAB — COMPREHENSIVE METABOLIC PANEL
ALK PHOS: 43 U/L (ref 40–150)
ALT: 17 U/L (ref 0–55)
ANION GAP: 9 meq/L (ref 3–11)
AST: 20 U/L (ref 5–34)
Albumin: 3.4 g/dL — ABNORMAL LOW (ref 3.5–5.0)
BILIRUBIN TOTAL: 0.45 mg/dL (ref 0.20–1.20)
BUN: 17.9 mg/dL (ref 7.0–26.0)
CALCIUM: 9 mg/dL (ref 8.4–10.4)
CO2: 24 mEq/L (ref 22–29)
CREATININE: 1.1 mg/dL (ref 0.6–1.1)
Chloride: 107 mEq/L (ref 98–109)
EGFR: 55 mL/min/{1.73_m2} — ABNORMAL LOW (ref >90)
Glucose: 101 mg/dl (ref 70–140)
Potassium: 3.9 mEq/L (ref 3.5–5.1)
Sodium: 141 mEq/L (ref 136–145)
TOTAL PROTEIN: 6.9 g/dL (ref 6.4–8.3)

## 2016-09-19 LAB — CBC WITH DIFFERENTIAL/PLATELET
BASO%: 1.2 % (ref 0.0–2.0)
Basophils Absolute: 0.1 10*3/uL (ref 0.0–0.1)
EOS ABS: 0.2 10*3/uL (ref 0.0–0.5)
EOS%: 3.5 % (ref 0.0–7.0)
HEMATOCRIT: 38.7 % (ref 34.8–46.6)
HGB: 12.7 g/dL (ref 11.6–15.9)
LYMPH#: 1.6 10*3/uL (ref 0.9–3.3)
LYMPH%: 27.6 % (ref 14.0–49.7)
MCH: 27.5 pg (ref 25.1–34.0)
MCHC: 32.9 g/dL (ref 31.5–36.0)
MCV: 83.7 fL (ref 79.5–101.0)
MONO#: 0.4 10*3/uL (ref 0.1–0.9)
MONO%: 6.1 % (ref 0.0–14.0)
NEUT%: 61.6 % (ref 38.4–76.8)
NEUTROS ABS: 3.6 10*3/uL (ref 1.5–6.5)
PLATELETS: 266 10*3/uL (ref 145–400)
RBC: 4.62 10*6/uL (ref 3.70–5.45)
RDW: 14.7 % — ABNORMAL HIGH (ref 11.2–14.5)
WBC: 5.9 10*3/uL (ref 3.9–10.3)

## 2016-09-19 MED ORDER — DENOSUMAB 60 MG/ML ~~LOC~~ SOLN
60.0000 mg | Freq: Once | SUBCUTANEOUS | Status: AC
  Administered 2016-09-19: 60 mg via SUBCUTANEOUS
  Filled 2016-09-19: qty 1

## 2016-09-19 NOTE — Patient Instructions (Signed)
Denosumab injection  What is this medicine?  DENOSUMAB (den oh sue mab) slows bone breakdown. Prolia is used to treat osteoporosis in women after menopause and in men. Xgeva is used to prevent bone fractures and other bone problems caused by cancer bone metastases. Xgeva is also used to treat giant cell tumor of the bone.  This medicine may be used for other purposes; ask your health care provider or pharmacist if you have questions.  What should I tell my health care provider before I take this medicine?  They need to know if you have any of these conditions:  -dental disease  -eczema  -infection or history of infections  -kidney disease or on dialysis  -low blood calcium or vitamin D  -malabsorption syndrome  -scheduled to have surgery or tooth extraction  -taking medicine that contains denosumab  -thyroid or parathyroid disease  -an unusual reaction to denosumab, other medicines, foods, dyes, or preservatives  -pregnant or trying to get pregnant  -breast-feeding  How should I use this medicine?  This medicine is for injection under the skin. It is given by a health care professional in a hospital or clinic setting.  If you are getting Prolia, a special MedGuide will be given to you by the pharmacist with each prescription and refill. Be sure to read this information carefully each time.  For Prolia, talk to your pediatrician regarding the use of this medicine in children. Special care may be needed. For Xgeva, talk to your pediatrician regarding the use of this medicine in children. While this drug may be prescribed for children as young as 13 years for selected conditions, precautions do apply.  Overdosage: If you think you have taken too much of this medicine contact a poison control center or emergency room at once.  NOTE: This medicine is only for you. Do not share this medicine with others.  What if I miss a dose?  It is important not to miss your dose. Call your doctor or health care professional if you are  unable to keep an appointment.  What may interact with this medicine?  Do not take this medicine with any of the following medications:  -other medicines containing denosumab  This medicine may also interact with the following medications:  -medicines that suppress the immune system  -medicines that treat cancer  -steroid medicines like prednisone or cortisone  This list may not describe all possible interactions. Give your health care provider a list of all the medicines, herbs, non-prescription drugs, or dietary supplements you use. Also tell them if you smoke, drink alcohol, or use illegal drugs. Some items may interact with your medicine.  What should I watch for while using this medicine?  Visit your doctor or health care professional for regular checks on your progress. Your doctor or health care professional may order blood tests and other tests to see how you are doing.  Call your doctor or health care professional if you get a cold or other infection while receiving this medicine. Do not treat yourself. This medicine may decrease your body's ability to fight infection.  You should make sure you get enough calcium and vitamin D while you are taking this medicine, unless your doctor tells you not to. Discuss the foods you eat and the vitamins you take with your health care professional.  See your dentist regularly. Brush and floss your teeth as directed. Before you have any dental work done, tell your dentist you are receiving this medicine.  Do   not become pregnant while taking this medicine or for 5 months after stopping it. Women should inform their doctor if they wish to become pregnant or think they might be pregnant. There is a potential for serious side effects to an unborn child. Talk to your health care professional or pharmacist for more information.  What side effects may I notice from receiving this medicine?  Side effects that you should report to your doctor or health care professional as soon as  possible:  -allergic reactions like skin rash, itching or hives, swelling of the face, lips, or tongue  -breathing problems  -chest pain  -fast, irregular heartbeat  -feeling faint or lightheaded, falls  -fever, chills, or any other sign of infection  -muscle spasms, tightening, or twitches  -numbness or tingling  -skin blisters or bumps, or is dry, peels, or red  -slow healing or unexplained pain in the mouth or jaw  -unusual bleeding or bruising  Side effects that usually do not require medical attention (Report these to your doctor or health care professional if they continue or are bothersome.):  -muscle pain  -stomach upset, gas  This list may not describe all possible side effects. Call your doctor for medical advice about side effects. You may report side effects to FDA at 1-800-FDA-1088.  Where should I keep my medicine?  This medicine is only given in a clinic, doctor's office, or other health care setting and will not be stored at home.  NOTE: This sheet is a summary. It may not cover all possible information. If you have questions about this medicine, talk to your doctor, pharmacist, or health care provider.      2016, Elsevier/Gold Standard. (2012-05-14 12:37:47)

## 2016-12-09 ENCOUNTER — Other Ambulatory Visit: Payer: Self-pay | Admitting: Nurse Practitioner

## 2017-01-03 ENCOUNTER — Encounter: Payer: Self-pay | Admitting: Oncology

## 2017-01-10 ENCOUNTER — Other Ambulatory Visit: Payer: Self-pay | Admitting: Oncology

## 2017-01-10 DIAGNOSIS — Z853 Personal history of malignant neoplasm of breast: Secondary | ICD-10-CM

## 2017-01-16 ENCOUNTER — Telehealth: Payer: Self-pay | Admitting: Oncology

## 2017-01-16 NOTE — Telephone Encounter (Signed)
Patient called and moved 3/26 f/u to 4/9 due to out of town. patient has new date/time.

## 2017-01-31 ENCOUNTER — Telehealth: Payer: Self-pay | Admitting: Oncology

## 2017-01-31 NOTE — Telephone Encounter (Signed)
Patient called and moved 4/9 f/u to 4/24 due to she will be out of town.

## 2017-02-10 ENCOUNTER — Other Ambulatory Visit: Payer: Self-pay | Admitting: *Deleted

## 2017-02-10 DIAGNOSIS — C50511 Malignant neoplasm of lower-outer quadrant of right female breast: Secondary | ICD-10-CM

## 2017-02-13 ENCOUNTER — Other Ambulatory Visit (HOSPITAL_BASED_OUTPATIENT_CLINIC_OR_DEPARTMENT_OTHER): Payer: Managed Care, Other (non HMO)

## 2017-02-13 DIAGNOSIS — C50511 Malignant neoplasm of lower-outer quadrant of right female breast: Secondary | ICD-10-CM | POA: Diagnosis not present

## 2017-02-13 LAB — CBC WITH DIFFERENTIAL/PLATELET
BASO%: 1.1 % (ref 0.0–2.0)
BASOS ABS: 0.1 10*3/uL (ref 0.0–0.1)
EOS%: 1.8 % (ref 0.0–7.0)
Eosinophils Absolute: 0.1 10*3/uL (ref 0.0–0.5)
HCT: 38.5 % (ref 34.8–46.6)
HEMOGLOBIN: 13 g/dL (ref 11.6–15.9)
LYMPH%: 26.1 % (ref 14.0–49.7)
MCH: 28.2 pg (ref 25.1–34.0)
MCHC: 33.8 g/dL (ref 31.5–36.0)
MCV: 83.5 fL (ref 79.5–101.0)
MONO#: 0.5 10*3/uL (ref 0.1–0.9)
MONO%: 6.1 % (ref 0.0–14.0)
NEUT%: 64.9 % (ref 38.4–76.8)
NEUTROS ABS: 5.1 10*3/uL (ref 1.5–6.5)
PLATELETS: 278 10*3/uL (ref 145–400)
RBC: 4.61 10*6/uL (ref 3.70–5.45)
RDW: 14.9 % — ABNORMAL HIGH (ref 11.2–14.5)
WBC: 7.9 10*3/uL (ref 3.9–10.3)
lymph#: 2.1 10*3/uL (ref 0.9–3.3)

## 2017-02-13 LAB — COMPREHENSIVE METABOLIC PANEL
ALBUMIN: 3.6 g/dL (ref 3.5–5.0)
ALT: 18 U/L (ref 0–55)
AST: 19 U/L (ref 5–34)
Alkaline Phosphatase: 39 U/L — ABNORMAL LOW (ref 40–150)
Anion Gap: 10 mEq/L (ref 3–11)
BUN: 20.5 mg/dL (ref 7.0–26.0)
CHLORIDE: 107 meq/L (ref 98–109)
CO2: 23 meq/L (ref 22–29)
CREATININE: 1.1 mg/dL (ref 0.6–1.1)
Calcium: 9.2 mg/dL (ref 8.4–10.4)
EGFR: 54 mL/min/{1.73_m2} — AB (ref >90)
GLUCOSE: 110 mg/dL (ref 70–140)
POTASSIUM: 3.9 meq/L (ref 3.5–5.1)
Sodium: 140 mEq/L (ref 136–145)
TOTAL PROTEIN: 6.9 g/dL (ref 6.4–8.3)
Total Bilirubin: 0.43 mg/dL (ref 0.20–1.20)

## 2017-02-20 ENCOUNTER — Ambulatory Visit: Payer: Managed Care, Other (non HMO) | Admitting: Oncology

## 2017-03-01 ENCOUNTER — Ambulatory Visit
Admission: RE | Admit: 2017-03-01 | Discharge: 2017-03-01 | Disposition: A | Discharge status: Home / Self Care | Payer: Managed Care, Other (non HMO) | Source: Ambulatory Visit | Attending: Oncology | Admitting: Oncology

## 2017-03-01 DIAGNOSIS — Z853 Personal history of malignant neoplasm of breast: Secondary | ICD-10-CM

## 2017-03-01 HISTORY — DX: Personal history of antineoplastic chemotherapy: Z92.21

## 2017-03-01 HISTORY — DX: Personal history of irradiation: Z92.3

## 2017-03-06 ENCOUNTER — Ambulatory Visit: Payer: Managed Care, Other (non HMO) | Admitting: Oncology

## 2017-03-21 ENCOUNTER — Ambulatory Visit (HOSPITAL_BASED_OUTPATIENT_CLINIC_OR_DEPARTMENT_OTHER): Payer: Managed Care, Other (non HMO) | Admitting: Oncology

## 2017-03-21 VITALS — BP 121/70 | HR 82 | Temp 98.1°F | Resp 18 | Ht 64.0 in | Wt 170.6 lb

## 2017-03-21 DIAGNOSIS — C50511 Malignant neoplasm of lower-outer quadrant of right female breast: Secondary | ICD-10-CM

## 2017-03-21 DIAGNOSIS — M8580 Other specified disorders of bone density and structure, unspecified site: Secondary | ICD-10-CM

## 2017-03-21 DIAGNOSIS — Z170 Estrogen receptor positive status [ER+]: Secondary | ICD-10-CM

## 2017-03-21 DIAGNOSIS — C50311 Malignant neoplasm of lower-inner quadrant of right female breast: Secondary | ICD-10-CM

## 2017-03-21 NOTE — Progress Notes (Signed)
Patient ID: Anita Randolph, female   DOB: 06-Jan-1951, 66 y.o.   MRN: [id number] ID: Anita Randolph   DOB: May 25, 1951  MR#: [id number]  SKA#:[id number]  PCP: Anita Randolph GYN: Anita Randolph SU: Anita Randolph], Anita Randolph OTHER MD: Anita Randolph, Anita Randolph, [place]  CHIEF COMPLAINT: Estrogen receptor positive breast cancer  CURRENT TREATMENT: Tamoxifen   HISTORY OF PRESENT ILLNESS: From the original intake note:  Anita Randolph herself palpated a mass in her right breast 11/09/2012. She brought this to Anita Randolph attention and mammography was obtained at [facility], (I do not have that report today) confirming a mass in the lower inner quadrant of the right breast. 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  Bilateral breast MRI 11/22/2012 showed a far posterior right breast mass measuring 3.2 cm with no discernible fat plane between the mass and the underlying pectoralis major. There was extension of irregular enhancement into the superficial aspect of the cholesterol is muscle underlying the mass, but there was no evidence for chest wall involvement, internal mammary artery chain or axillary lymph node involvement, or anything else in the breast or the contralateral breast.  The patient's subsequent history is as detailed below  INTERVAL HISTORY: Anita Randolph returns today for follow-up of her estrogen receptor positive breast cancer. She continues on tamoxifen, now with excellent tolerance. She denies significant problems with hot flashes or vaginal dryness her with this. She obtains a drug at a good price.  She is also on per liter, with good tolerance. Unfortunately we have been insurance issues regarding this.  REVIEW OF SYSTEMS: Anita Randolph retired a couple of years ago and is now exercising 5-6 days a week.  Her husband Anita Randolph has put in his retirement as well. They're looking for to travel and is spending time with her grandchildren, the first of which is scheduled in the next few months. A detailed review of systems today was otherwise stable  PAST MEDICAL HISTORY: Past Medical History:  Diagnosis Date  . Anemia    s/p chemotherapy  . Breast cancer ([place]) 11/15/12   Right invasive ductal ca  . Cancer [facility])    breast  . 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 Fibroid 2007   largest 5x5x5cm  . GERD (gastroesophageal reflux disease)    only during chemotherapy  . Hypertension   . Microscopic hematuria    negative w/u  . Neuromuscular disorder ([facility])    tingling hands and feet from chemo-getting better  . Osteopenia due to cancer therapy   . Personal history of chemotherapy   . Personal history of radiation therapy   . Radiation 06/06/13-07/22/13   Right breast    PAST SURGICAL HISTORY: Past Surgical History:  Procedure Laterality Date  . ARM SKIN LESION BIOPSY / EXCISION  2011   nodular faciitis lt arm  . BREAST LUMPECTOMY WITH NEEDLE LOCALIZATION AND AXILLARY SENTINEL LYMPH NODE BX Right 05/07/2013   Procedure: NEEDLE LOCALIZATION RIGHT BREAST LUMPECTOMY AND   SENTINEL LYMPH NODE  ;  Surgeon: Anita Lasso, MD;  Location: [facility];  Service: General;  Laterality: Right;  . BREAST SURGERY    . COLONOSCOPY    . LIPOMA EXCISION     -right back(age 62)  . PORTACATH PLACEMENT  12/20/2012   Procedure: INSERTION PORT-A-CATH;  Surgeon: Anita Meigs  Leta Jungling, MD;  Location: [facility] OR;  Service: General;  Laterality: N/A;  . TONSILLECTOMY      FAMILY HISTORY Family History  Problem Relation Age of Onset  . Breast cancer Mother 82  . Osteoporosis Mother   . Diabetes Mother   . Thyroid disease Mother   . Heart disease Father   . Bladder Cancer Father 30  . Breast cancer Maternal Aunt     diagnosed in her 79s  . Multiple sclerosis Paternal Aunt   .  Heart disease Maternal Grandmother   . Diabetes Maternal Grandmother   . Mental retardation Cousin     maternal cousins; brothers, sons of aunt with breast cancer   the patient's father immigrated from [place] 1938, living in [place] for about 10 years. He had significant coronary artery disease, bladder cancer, and a ruptured aneurysm, but lived to be 91. The patient's mother is living at age 85. The patient has one brother, no sisters. The patient's mother had breast cancer diagnosed at age 60. The patient is of a Ashkenazi Jewish descent. She will be meeting with our genetics counselor today  GYNECOLOGIC HISTORY: Menarche age 67, first live birth age 68, she is GX P2, menopause 2006, received hormone replacement until approximately 2010.  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 Her husband Anita Randolph has a management position for [employer]. This involves a lot of traveling. Son Anita Randolph lives in [place] and is a sports Banker; he will marry Anita Randolph) in [place] 2016. Daughter Anita Randolph lives in [place], where she is a Gaffer in international developments and is also getting an MPH in Development worker, international aid. The patient has no grandchildren.   ADVANCED DIRECTIVES: In place  HEALTH MAINTENANCE: Social History  Substance Use Topics  . Smoking status: Former Smoker    Packs/day: 1.00    Years: 4.00    Types: Cigarettes    Quit date: 11/30/1974  . Smokeless tobacco: Never Used  . Alcohol use 1.0 oz/week    2 drink(s) per week     Comment: 1-2 glasses of wine per week   Anita Randolph exercises 3-4 times a week, doing weights, walking, and other structures exercises.   Colonoscopy: 2009/ Anita Randolph  PAP: 2013  Bone density: AUG 2016 shows think osteopeniad , Improved   No Known Allergies  Current Outpatient Prescriptions  Medication Sig Dispense Refill  . calcium-vitamin D (OSCAL WITH D) 500-200 MG-UNIT per tablet  Take 1 tablet by mouth 2 (two) times daily. 600 vit d    . cholecalciferol (VITAMIN D) 1000 units tablet Take 1 tablet (1,000 Units total) by mouth daily. 100 tablet 4  . simvastatin (ZOCOR) 20 MG tablet Take 20 mg by mouth daily.     No current facility-administered medications for this visit.     OBJECTIVE: Middle-aged white woman In no acute distress  Vitals:   03/21/17 1204  BP: 121/70  Pulse: 82  Resp: 18  Temp: 98.1 F (36.7 C)     Body mass index is 29.28 kg/m.    ECOG FS: 0 Filed Weights   03/21/17 1204  Weight: 170 lb 9.6 oz (77.4 kg)   Sclerae unicteric, pupils round and equal Oropharynx clear and moist No cervical or supraclavicular adenopathy Lungs no rales or rhonchi Heart regular rate and rhythm Abd soft, nontender, positive bowel sounds MSK no focal spinal tenderness, no upper extremity lymphedema Neuro: nonfocal, well oriented, appropriate affect Breasts: The right  breast has undergone lumpectomy followed by radiation with no evidence of local recurrence. The left breast is unremarkable. Both axillae are benign.   LAB RESULTS: CBC    Component Value Date/Time   WBC 7.9 02/13/2017 1142   RBC 4.61 02/13/2017 1142   HGB 13.0 02/13/2017 1142   HCT 38.5 02/13/2017 1142   PLT 278 02/13/2017 1142   MCV 83.5 02/13/2017 1142   MCH 28.2 02/13/2017 1142   MCHC 33.8 02/13/2017 1142   RDW 14.9 (H) 02/13/2017 1142   LYMPHSABS 2.1 02/13/2017 1142   MONOABS 0.5 02/13/2017 1142   EOSABS 0.1 02/13/2017 1142   BASOSABS 0.1 02/13/2017 1142       Chemistry      Component Value Date/Time   NA 140 02/13/2017 1142   K 3.9 02/13/2017 1142   CL 109 (H) 05/02/2013 1043   CO2 23 02/13/2017 1142   BUN 20.5 02/13/2017 1142   CREATININE 1.1 02/13/2017 1142      Component Value Date/Time   CALCIUM 9.2 02/13/2017 1142        STUDIES: Mm Diag Breast Tomo Bilateral  Result Date: 03/01/2017 CLINICAL DATA:  Right lumpectomy with radiation therapy in 2014. EXAM: 2D  DIGITAL DIAGNOSTIC BILATERAL MAMMOGRAM WITH CAD AND ADJUNCT TOMO COMPARISON:  Prior studies including 02/18/2016 ACR Breast Density Category b: There are scattered areas of fibroglandular density. FINDINGS: Post operative changes are seen in the rightbreast. No suspicious mass, distortion, or microcalcifications are identified to suggest presence of malignancy. Mammographic images were processed with CAD. IMPRESSION: No mammographic evidence for malignancy. RECOMMENDATION: Diagnostic mammogram is suggested in 1 year. (Code:DM-B-01Y) I have discussed the findings and recommendations with the patient. Results were also provided in writing at the conclusion of the visit. If applicable, a reminder letter will be sent to the patient regarding the next appointment. BI-RADS CATEGORY  2: Benign. Electronically Signed   By: Norva Pavlov M.D.   On: 03/01/2017 11:14     ASSESSMENT: 66 y.o. [place] woman   (1)  s/p right breast biopsy 11/15/2012 for a clinical T2 N0, stage IIA invasive ductal carcinoma, grade 3, with superficial involvement of the pectoralis muscle, estrogen receptor 29% positive, progesterone receptor negative, with an Mib-1 of 79% and HER-2 amplification by CISH with a ratio of 4.39  (2) treated in the neoadjuvant setting, completing 6 cycles of docetaxel/ carboplatin/ trastuzumab 04/11/2013  (3) trastuzumab continued for total of one year (through January 2015).-- Final echo 01/21/2014 showed a well-preserved ejecton fraction  (4) right lumpectomy and sentinel lymph node sampling 05/07/2013 showed a pathologic complete response  (5) adjuvant radiation completed 07/22/2013  (6) started tamoxifen 08/20/2013, discontinued December 2016 with poor tolerance  (7) osteopenia-- T score -1.9 on August 2014;   (a)  denosumab/Prolia twice a year started April 2015  (b) repeat bone density study 07/28/2015 shows an improved T score at -1.4  (c) repeat bone density under Dr. Vickey Sages obtained  01/10/2017 showed a T score of -1.4  (8) tamoxifen resumed February 2017  PLAN:  Beverlyn is now just about 4 years out from definitive surgery for her breast cancer with no evidence of disease recurrence. This is very favorable.  She is tolerating the tamoxifen well. The plan continues to be to continue that for a total of 5 years, which will take [place] to December 2019.  We are going to move the Prolia treatment back for insurance issues (she will be going on Medicare as of August). She will receive treatment in  October and then again when she sees me April of next year  At the next visit she will be close enough to graduation that we may consider letting her simply complete her remaining months of tamoxifen with no further visits here.   Kingdom Vanzanten C    03/21/2017

## 2017-04-10 ENCOUNTER — Other Ambulatory Visit: Payer: Self-pay

## 2017-04-10 MED ORDER — VITAMIN D 1000 UNITS PO TABS: 1000.0000 [IU] | ORAL_TABLET | Freq: Every day | ORAL | 4 refills | Status: DC

## 2017-04-10 MED ORDER — TAMOXIFEN CITRATE 20 MG PO TABS: 20.0000 mg | ORAL_TABLET | Freq: Every day | ORAL | 1 refills | Status: DC

## 2017-06-26 ENCOUNTER — Telehealth: Payer: Self-pay | Admitting: *Deleted

## 2017-06-26 ENCOUNTER — Encounter: Payer: Self-pay | Admitting: Oncology

## 2017-06-26 NOTE — Telephone Encounter (Signed)
"  Who do I need to see about a new ridge or lump under arm of my right breast which is same side of lumpectomy in 2014.  No redness, warmth, pain or swelling."  Offered vosit with [facility] Wednesday (9:00 am) or Thursday (2:00 pm).  Declines "Wednesday per a prevously scheduled 8:00 am appointment.  Thursday Flying out at 11:00 am.  I do not want to see the surgeon.  Could I be seen next week.  It's not an emergency.  Dr. Jana Hakim asked that I tell you all if something changes or is new." First available is 07-04-2017 at 8:30 am.  Scheduling message sent.

## 2017-07-03 DIAGNOSIS — M859 Disorder of bone density and structure, unspecified: Secondary | ICD-10-CM | POA: Diagnosis not present

## 2017-07-04 ENCOUNTER — Encounter: Payer: Self-pay | Admitting: Adult Health

## 2017-07-04 ENCOUNTER — Telehealth: Payer: Self-pay | Admitting: Emergency Medicine

## 2017-07-04 ENCOUNTER — Ambulatory Visit (HOSPITAL_BASED_OUTPATIENT_CLINIC_OR_DEPARTMENT_OTHER): Payer: Medicare Other | Admitting: Adult Health

## 2017-07-04 VITALS — BP 139/76 | HR 83 | Temp 97.9°F | Resp 18 | Ht 64.0 in | Wt 169.1 lb

## 2017-07-04 DIAGNOSIS — C50511 Malignant neoplasm of lower-outer quadrant of right female breast: Secondary | ICD-10-CM

## 2017-07-04 DIAGNOSIS — N6459 Other signs and symptoms in breast: Secondary | ICD-10-CM | POA: Diagnosis not present

## 2017-07-04 DIAGNOSIS — Z170 Estrogen receptor positive status [ER+]: Secondary | ICD-10-CM | POA: Diagnosis not present

## 2017-07-04 DIAGNOSIS — N63 Unspecified lump in unspecified breast: Secondary | ICD-10-CM

## 2017-07-04 DIAGNOSIS — M8580 Other specified disorders of bone density and structure, unspecified site: Secondary | ICD-10-CM | POA: Diagnosis not present

## 2017-07-04 NOTE — Progress Notes (Signed)
Patient ID: Anita Randolph, female   DOB: 03-Feb-1951, 65 y.o.   MRN: [id number] ID: Anita Randolph   DOB: 07/20/1951  MR#: [id number]  BZJ#:[id number]  PCP: Anita Randolph GYN: Anita Randolph SU: Anita Randolph], Anita Randolph OTHER MD: Anita Randolph, Anita Randolph, Anita Randolph  CHIEF COMPLAINT: Estrogen receptor positive breast cancer  CURRENT TREATMENT: Tamoxifen   HISTORY OF PRESENT ILLNESS: From the original intake note:  Anita Randolph herself palpated a mass in her right breast 11/09/2012. She brought this to Dr. Julieta Randolph attention and mammography was obtained at [facility], (I do not have that report today) confirming a mass in the lower inner quadrant of the right breast. 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  Bilateral breast MRI 11/22/2012 showed a far posterior right breast mass measuring 3.2 cm with no discernible fat plane between the mass and the underlying pectoralis major. There was extension of irregular enhancement into the superficial aspect of the cholesterol is muscle underlying the mass, but there was no evidence for chest wall involvement, internal mammary artery chain or axillary lymph node involvement, or anything else in the breast or the contralateral breast.  The patient's subsequent history is as detailed below  INTERVAL HISTORY: Anita Randolph is here today accompanied by her husband Anita Randolph about a right breast concern.  She noted a new ridge under her right arm and breast.  She noted this last week.  She denies any pain, breast change, other skin change, or nipple change.  Last mammogram was 03/01/2017 and was normal.    REVIEW OF SYSTEMS: Other than what is noted above, a detailed ROS is non contributory.   PAST MEDICAL HISTORY: Past Medical History:  Diagnosis Date  . Anemia    s/p  chemotherapy  . Breast cancer ([place]) 11/15/12   Right invasive ductal ca  . Cancer [facility])    breast  . 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 Fibroid 2007   largest 5x5x5cm  . GERD (gastroesophageal reflux disease)    only during chemotherapy  . Hypertension   . Microscopic hematuria    negative w/u  . Neuromuscular disorder (HCC)    tingling hands and feet from chemo-getting better  . Osteopenia due to cancer therapy   . Personal history of chemotherapy   . Personal history of radiation therapy   . Radiation 06/06/13-07/22/13   Right breast    PAST SURGICAL HISTORY: Past Surgical History:  Procedure Laterality Date  . ARM SKIN LESION BIOPSY / EXCISION  2011   nodular faciitis lt arm  . BREAST LUMPECTOMY WITH NEEDLE LOCALIZATION AND AXILLARY SENTINEL LYMPH NODE BX Right 05/07/2013   Procedure: NEEDLE LOCALIZATION RIGHT BREAST LUMPECTOMY AND   SENTINEL LYMPH NODE  ;  Surgeon: Anita Lasso, MD;  Location: [place];  Service: General;  Laterality: Right;  . BREAST SURGERY    . COLONOSCOPY    . LIPOMA EXCISION     -right back(age 24)  . PORTACATH PLACEMENT  12/20/2012   Procedure: INSERTION PORT-A-CATH;  Surgeon: Anita Lasso, MD;  Location: [facility];  Service: General;  Laterality: N/A;  . TONSILLECTOMY      FAMILY HISTORY Family History  Problem Relation Age of Onset  . Breast cancer Mother 76  . Osteoporosis Mother   . Diabetes Mother   .  Thyroid disease Mother   . Heart disease Father   . Bladder Cancer Father 56  . Breast cancer Maternal Aunt        diagnosed in her 74s  . Multiple sclerosis Paternal Aunt   . Heart disease Maternal Grandmother   . Diabetes Maternal Grandmother   . Mental retardation Cousin        maternal cousins; brothers, sons of aunt with breast cancer   the patient's father immigrated from [place] 1938, living in [place] for about 10 years. He had significant coronary artery disease, bladder cancer, and  a ruptured aneurysm, but lived to be 4. The patient's mother is living at age 38. The patient has one brother, no sisters. The patient's mother had breast cancer diagnosed at age 40. The patient is of a [place] Jewish descent. She will be meeting with our genetics counselor today  GYNECOLOGIC HISTORY: Menarche age 15, first live birth age 69, she is [place] P2, menopause 2006, received hormone replacement until approximately 2010.  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 Her husband Anita Randolph has a management position for [place]. This involves a lot of traveling. Son Anita Randolph lives in [place] and is a sports Catering manager; he will marry Anita Randolph) in [place] 2016. Daughter Anita Randolph lives in [place], where she is a Sport and exercise psychologist in international developments and is also getting an MPH in Physicist, medical. The patient has no grandchildren.   ADVANCED DIRECTIVES: In place  HEALTH MAINTENANCE: Social History  Substance Use Topics  . Smoking status: Former Smoker    Packs/day: 1.00    Years: 4.00    Types: Cigarettes    Quit date: 11/30/1974  . Smokeless tobacco: Never Used  . Alcohol use 1.0 oz/week    2 drink(s) per week     Comment: 1-2 glasses of wine per week   [place]ita Randolph exercises 3-4 times a week, doing weights, walking, and other structures exercises.   Colonoscopy: 2009/ [place]  PAP: 2013  Bone density: AUG 2016 shows think osteopeniad , Improved   No Known Allergies  Current Outpatient Prescriptions  Medication Sig Dispense Refill  . calcium-vitamin Randolph (OSCAL WITH Randolph) 500-200 MG-UNIT per tablet Take 1 tablet by mouth 2 (two) times daily. 600 vit Randolph    . cholecalciferol (VITAMIN Randolph) 1000 units tablet Take 1 tablet (1,000 Units total) by mouth daily. 100 tablet 4  . simvastatin ([place]ita [place]) 20 MG tablet Take 20 mg by mouth daily.    . tamoxifen ([place]ita [place]) 20 MG tablet Take 1 tablet (20 mg total) by mouth daily. 30  tablet 1   No current facility-administered medications for this visit.     OBJECTIVE:   Vitals:   07/04/17 0854  BP: 139/76  Pulse: 83  Resp: 18  Temp: 97.9 F (36.6 C)     Body mass index is 29.03 kg/m.    ECOG FS: 0 Filed Weights   07/04/17 0854  Weight: 169 lb 1.6 oz (76.7 kg)   GENERAL: Patient is a well appearing female in no acute distress HEENT:  Sclerae anicteric.  Oropharynx clear and moist. No ulcerations or evidence of oropharyngeal candidiasis. Neck is supple.  NODES:  No cervical, supraclavicular, or axillary lymphadenopathy palpated.  BREAST EXAM:  Left breast without nodules, masses, skin/nipple changes, right axilla/axillary tail with thickness and about a 0.5cm nodularity, very discrete, no other mass, skin/nipple change in right breast LUNGS:  Clear to auscultation bilaterally.  No  wheezes or rhonchi. HEART:  Regular rate and rhythm. No murmur appreciated. ABDOMEN:  Soft, nontender.  Positive, normoactive bowel sounds. No organomegaly palpated. MSK:  No focal spinal tenderness to palpation. Full range of motion bilaterally in the upper extremities. EXTREMITIES:  No peripheral edema.   SKIN:  Clear with no obvious rashes or skin changes. No nail dyscrasia. NEURO:  Nonfocal. Well oriented.  Appropriate affect.     LAB RESULTS: CBC    Component Value Date/Time   WBC 7.9 02/13/2017 1142   RBC 4.61 02/13/2017 1142   HGB 13.0 02/13/2017 1142   HCT 38.5 02/13/2017 1142   PLT 278 02/13/2017 1142   MCV 83.5 02/13/2017 1142   MCH 28.2 02/13/2017 1142   MCHC 33.8 02/13/2017 1142   RDW 14.9 (H) 02/13/2017 1142   LYMPHSABS 2.1 02/13/2017 1142   MONOABS 0.5 02/13/2017 1142   EOSABS 0.1 02/13/2017 1142   BASOSABS 0.1 02/13/2017 1142       Chemistry      Component Value Date/Time   NA 140 02/13/2017 1142   K 3.9 02/13/2017 1142   CL 109 (H) 05/02/2013 1043   CO2 23 02/13/2017 1142   BUN 20.5 02/13/2017 1142   CREATININE 1.1 02/13/2017 1142       Component Value Date/Time   CALCIUM 9.2 02/13/2017 1142        STUDIES: No results found.   ASSESSMENT: 66 y.o. [place] woman   (1)  s/p right breast biopsy 11/15/2012 for a clinical T2 N0, stage IIA invasive ductal carcinoma, grade 3, with superficial involvement of the pectoralis muscle, estrogen receptor 29% positive, progesterone receptor negative, with an Mib-1 of 79% and HER-2 amplification by CISH with a ratio of 4.39  (2) treated in the neoadjuvant setting, completing 6 cycles of docetaxel/ carboplatin/ trastuzumab 04/11/2013  (3) trastuzumab continued for total of one year (through January 2015).-- Final echo 01/21/2014 showed a well-preserved ejecton fraction  (4) right lumpectomy and sentinel lymph node sampling 05/07/2013 showed a pathologic complete response  (5) adjuvant radiation completed 07/22/2013  (6) started tamoxifen 08/20/2013, discontinued December 2016 with poor tolerance  (7) osteopenia-- T score -1.9 on August 2014;   (a)  denosumab/Prolia twice a year started April 2015  (b) repeat bone density study 07/28/2015 shows an improved T score at -1.4  (c) repeat bone density under Dr. Orvan Seen obtained 01/10/2017 showed a T score of -1.4  (8) tamoxifen resumed February 2017  PLAN:  [place] has a discrete area of thickening in her right axillary tail towards her axilla.  I have ordered a right breast diagnostic mammo and ultrasound to be done at the next available appointment time to fully evaluate.  She has these done at the breast [facility].  She is otherwise doing well.    She knows to contact us if she has any further problems.    A total of (30) minutes of face-to-face time was spent with this patient with greater than 50% of that time in counseling and care-coordination.    Scot Dock    07/04/2017

## 2017-07-04 NOTE — Telephone Encounter (Signed)
This nurse called the breast center and scheduled a [place] and Mammogram for the patient for this Friday august the 10th for new lump to rt breast.

## 2017-07-05 ENCOUNTER — Other Ambulatory Visit: Payer: Self-pay

## 2017-07-05 MED ORDER — VITAMIN D 1000 UNITS PO TABS: 1000.0000 [IU] | ORAL_TABLET | Freq: Every day | ORAL | 4 refills | Status: DC

## 2017-07-05 MED ORDER — TAMOXIFEN CITRATE 20 MG PO TABS: 20.0000 mg | ORAL_TABLET | Freq: Every day | ORAL | 1 refills | Status: DC

## 2017-07-07 ENCOUNTER — Ambulatory Visit
Admission: RE | Admit: 2017-07-07 | Discharge: 2017-07-07 | Disposition: A | Discharge status: Home / Self Care | Payer: Medicare Other | Source: Ambulatory Visit | Attending: Adult Health | Admitting: Adult Health

## 2017-07-07 DIAGNOSIS — Z170 Estrogen receptor positive status [ER+]: Principal | ICD-10-CM

## 2017-07-07 DIAGNOSIS — N63 Unspecified lump in unspecified breast: Secondary | ICD-10-CM

## 2017-07-07 DIAGNOSIS — C50511 Malignant neoplasm of lower-outer quadrant of right female breast: Secondary | ICD-10-CM

## 2017-07-07 DIAGNOSIS — N6489 Other specified disorders of breast: Secondary | ICD-10-CM | POA: Diagnosis not present

## 2017-07-07 DIAGNOSIS — R928 Other abnormal and inconclusive findings on diagnostic imaging of breast: Secondary | ICD-10-CM | POA: Diagnosis not present

## 2017-09-11 ENCOUNTER — Other Ambulatory Visit: Payer: Self-pay

## 2017-09-11 DIAGNOSIS — C50511 Malignant neoplasm of lower-outer quadrant of right female breast: Secondary | ICD-10-CM

## 2017-09-11 DIAGNOSIS — Z170 Estrogen receptor positive status [ER+]: Principal | ICD-10-CM

## 2017-09-12 ENCOUNTER — Other Ambulatory Visit: Payer: Medicare Other

## 2017-09-12 ENCOUNTER — Ambulatory Visit: Payer: Managed Care, Other (non HMO)

## 2017-09-14 ENCOUNTER — Other Ambulatory Visit: Payer: Self-pay | Admitting: Oncology

## 2017-09-14 DIAGNOSIS — Z23 Encounter for immunization: Secondary | ICD-10-CM | POA: Diagnosis not present

## 2017-09-19 ENCOUNTER — Other Ambulatory Visit: Payer: Managed Care, Other (non HMO)

## 2017-09-19 ENCOUNTER — Ambulatory Visit: Payer: Managed Care, Other (non HMO)

## 2017-10-17 DIAGNOSIS — L821 Other seborrheic keratosis: Secondary | ICD-10-CM | POA: Diagnosis not present

## 2017-10-17 DIAGNOSIS — D2262 Melanocytic nevi of left upper limb, including shoulder: Secondary | ICD-10-CM | POA: Diagnosis not present

## 2017-10-17 DIAGNOSIS — L814 Other melanin hyperpigmentation: Secondary | ICD-10-CM | POA: Diagnosis not present

## 2017-10-17 DIAGNOSIS — M7130 Other bursal cyst, unspecified site: Secondary | ICD-10-CM | POA: Diagnosis not present

## 2017-10-17 DIAGNOSIS — D2272 Melanocytic nevi of left lower limb, including hip: Secondary | ICD-10-CM | POA: Diagnosis not present

## 2017-10-24 DIAGNOSIS — L989 Disorder of the skin and subcutaneous tissue, unspecified: Secondary | ICD-10-CM | POA: Diagnosis not present

## 2017-10-24 DIAGNOSIS — N842 Polyp of vagina: Secondary | ICD-10-CM | POA: Diagnosis not present

## 2017-10-24 DIAGNOSIS — N898 Other specified noninflammatory disorders of vagina: Secondary | ICD-10-CM | POA: Diagnosis not present

## 2017-10-25 DIAGNOSIS — N898 Other specified noninflammatory disorders of vagina: Secondary | ICD-10-CM | POA: Diagnosis not present

## 2017-11-02 DIAGNOSIS — R21 Rash and other nonspecific skin eruption: Secondary | ICD-10-CM | POA: Diagnosis not present

## 2017-11-02 DIAGNOSIS — R509 Fever, unspecified: Secondary | ICD-10-CM | POA: Diagnosis not present

## 2017-11-02 DIAGNOSIS — T887XXA Unspecified adverse effect of drug or medicament, initial encounter: Secondary | ICD-10-CM | POA: Diagnosis not present

## 2017-11-07 ENCOUNTER — Other Ambulatory Visit: Payer: Self-pay | Admitting: Hematology and Oncology

## 2017-11-14 DIAGNOSIS — E559 Vitamin D deficiency, unspecified: Secondary | ICD-10-CM | POA: Diagnosis not present

## 2017-11-14 DIAGNOSIS — Z6828 Body mass index (BMI) 28.0-28.9, adult: Secondary | ICD-10-CM | POA: Diagnosis not present

## 2017-11-14 DIAGNOSIS — Z23 Encounter for immunization: Secondary | ICD-10-CM | POA: Diagnosis not present

## 2017-11-14 DIAGNOSIS — E7800 Pure hypercholesterolemia, unspecified: Secondary | ICD-10-CM | POA: Diagnosis not present

## 2017-11-14 DIAGNOSIS — Z0000 Encounter for general adult medical examination without abnormal findings: Secondary | ICD-10-CM | POA: Diagnosis not present

## 2017-11-14 DIAGNOSIS — Z79899 Other long term (current) drug therapy: Secondary | ICD-10-CM | POA: Diagnosis not present

## 2017-12-14 DIAGNOSIS — R6889 Other general symptoms and signs: Secondary | ICD-10-CM | POA: Diagnosis not present

## 2017-12-14 DIAGNOSIS — J209 Acute bronchitis, unspecified: Secondary | ICD-10-CM | POA: Diagnosis not present

## 2017-12-17 DIAGNOSIS — B349 Viral infection, unspecified: Secondary | ICD-10-CM | POA: Diagnosis not present

## 2017-12-17 DIAGNOSIS — J180 Bronchopneumonia, unspecified organism: Secondary | ICD-10-CM | POA: Diagnosis not present

## 2017-12-19 DIAGNOSIS — M810 Age-related osteoporosis without current pathological fracture: Secondary | ICD-10-CM | POA: Diagnosis not present

## 2017-12-19 DIAGNOSIS — M818 Other osteoporosis without current pathological fracture: Secondary | ICD-10-CM | POA: Diagnosis not present

## 2017-12-19 DIAGNOSIS — D252 Subserosal leiomyoma of uterus: Secondary | ICD-10-CM | POA: Diagnosis not present

## 2017-12-20 DIAGNOSIS — J180 Bronchopneumonia, unspecified organism: Secondary | ICD-10-CM | POA: Diagnosis not present

## 2018-01-22 ENCOUNTER — Other Ambulatory Visit: Payer: Self-pay | Admitting: Family Medicine

## 2018-01-22 ENCOUNTER — Ambulatory Visit
Admission: RE | Admit: 2018-01-22 | Discharge: 2018-01-22 | Disposition: A | Discharge status: Home / Self Care | Payer: Medicare Other | Source: Ambulatory Visit | Attending: Family Medicine | Admitting: Family Medicine

## 2018-01-22 DIAGNOSIS — J189 Pneumonia, unspecified organism: Secondary | ICD-10-CM

## 2018-01-23 ENCOUNTER — Telehealth: Payer: Self-pay | Admitting: Oncology

## 2018-01-23 NOTE — Telephone Encounter (Signed)
Patient called to r/s upcoming April appointment.

## 2018-01-29 ENCOUNTER — Other Ambulatory Visit: Payer: Self-pay | Admitting: Hematology and Oncology

## 2018-01-29 ENCOUNTER — Encounter (INDEPENDENT_AMBULATORY_CARE_PROVIDER_SITE_OTHER): Payer: Medicare Other | Admitting: Ophthalmology

## 2018-01-29 DIAGNOSIS — H35341 Macular cyst, hole, or pseudohole, right eye: Secondary | ICD-10-CM

## 2018-01-29 DIAGNOSIS — D3131 Benign neoplasm of right choroid: Secondary | ICD-10-CM

## 2018-01-29 DIAGNOSIS — H2513 Age-related nuclear cataract, bilateral: Secondary | ICD-10-CM

## 2018-01-29 DIAGNOSIS — H43813 Vitreous degeneration, bilateral: Secondary | ICD-10-CM | POA: Diagnosis not present

## 2018-01-29 NOTE — H&P (Signed)
Anita Randolph is an 67 y.o. female.   Chief Complaint: sudden loss of central vision in the right eye HPI: Noted distortion and loss of vision yesterday in the right eye  Past Medical History:  Diagnosis Date  . Anemia    s/p chemotherapy  . Breast cancer ([place]) 11/15/12   Right invasive ductal ca  . Cancer [facility])    breast  . 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 Fibroid 2007   largest 5x5x5cm  . GERD (gastroesophageal reflux disease)    only during chemotherapy  . Hypertension   . Microscopic hematuria    negative w/u  . Neuromuscular disorder (HCC)    tingling hands and feet from chemo-getting better  . Osteopenia due to cancer therapy   . Personal history of chemotherapy   . Personal history of radiation therapy   . Radiation 06/06/13-07/22/13   Right breast    Past Surgical History:  Procedure Laterality Date  . ARM SKIN LESION BIOPSY / EXCISION  2011   nodular faciitis lt arm  . BREAST LUMPECTOMY    . BREAST LUMPECTOMY WITH NEEDLE LOCALIZATION AND AXILLARY SENTINEL LYMPH NODE BX Right 05/07/2013   Procedure: NEEDLE LOCALIZATION RIGHT BREAST LUMPECTOMY AND   SENTINEL LYMPH NODE  ;  Surgeon: Haywood Lasso, MD;  Location: [place];  Service: General;  Laterality: Right;  . BREAST SURGERY    . COLONOSCOPY    . LIPOMA EXCISION     -right back(age 18)  . PORTACATH PLACEMENT  12/20/2012   Procedure: INSERTION PORT-A-CATH;  Surgeon: Haywood Lasso, MD;  Location[facility];  Service: General;  Laterality: N/A;  . TONSILLECTOMY      Family History  Problem Relation Age of Onset  . Breast cancer Mother 81  . Osteoporosis Mother   . Diabetes Mother   . Thyroid disease Mother   . Heart disease Father   . Bladder Cancer Father 42  . Breast cancer Maternal Aunt        diagnosed in her 78s  . Multiple sclerosis Paternal Aunt   . Heart disease Maternal Grandmother   . Diabetes Maternal Grandmother   . Mental retardation Cousin        maternal cousins; brothers, sons of aunt with breast cancer   Social History:  reports that she quit smoking about 43 years ago. Her smoking use included cigarettes. She has a 4.00 pack-year smoking history. she has never used smokeless tobacco. She reports that she drinks about 1.0 oz of alcohol per week. She reports that she does not use drugs.  Allergies: No Known Allergies  No medications prior to admission.    Review of systems otherwise negative  Last menstrual period 11/28/2004.  Physical exam: Mental status: oriented x3. Eyes: See eye exam associated with this date of surgery in media tab.  Scanned in by scanning center Ears, Nose, Throat: within normal limits Neck: Within Normal limits General: within normal limits Chest: Within normal limits Breast: deferred Heart: Within normal limits Abdomen: Within normal limits GU: deferred Extremities: within normal limits Skin: within normal limits  Assessment/Plan Macular hole right eye Plan: To [facility] for Pars plana vitrectomy, laser, membrane peel, laser, ILM peel, gas injection right eye  Hayden Pedro 01/29/2018, 4:51 PM

## 2018-01-31 DIAGNOSIS — Z779 Other contact with and (suspected) exposures hazardous to health: Secondary | ICD-10-CM | POA: Diagnosis not present

## 2018-01-31 DIAGNOSIS — M818 Other osteoporosis without current pathological fracture: Secondary | ICD-10-CM | POA: Diagnosis not present

## 2018-01-31 DIAGNOSIS — Z6828 Body mass index (BMI) 28.0-28.9, adult: Secondary | ICD-10-CM | POA: Diagnosis not present

## 2018-02-02 ENCOUNTER — Telehealth: Payer: Self-pay | Admitting: Oncology

## 2018-02-02 NOTE — Telephone Encounter (Signed)
Returned call to patient requesting to reschedule her April appointment.  Appointment moved per 3/8 phone msg

## 2018-02-16 ENCOUNTER — Encounter (HOSPITAL_COMMUNITY): Payer: Self-pay | Admitting: *Deleted

## 2018-02-16 ENCOUNTER — Other Ambulatory Visit: Payer: Self-pay

## 2018-02-16 NOTE — Progress Notes (Signed)
Pt denies SOB, chest pain, and being under the care of a cardiologist. Pt denies having a stress test and cardiac cath. Pt denies having an EKG and chest x ray within the last year. Pt denies recent labs. Pt stated that MD made her aware to stop taking  Aspirin,  Vitamins, fish oil and herbal medications. Do not take any NSAIDs ie: Ibuprofen, Advil, Naproxen or any medication containing Aspirin. Pt verbalized understanding of all pre-op instructions.

## 2018-02-20 ENCOUNTER — Encounter (HOSPITAL_COMMUNITY): Payer: Self-pay | Admitting: Certified Registered Nurse Anesthetist

## 2018-02-20 ENCOUNTER — Ambulatory Visit (HOSPITAL_COMMUNITY): Payer: Medicare Other | Admitting: Certified Registered Nurse Anesthetist

## 2018-02-20 ENCOUNTER — Other Ambulatory Visit: Payer: Self-pay

## 2018-02-20 ENCOUNTER — Encounter (HOSPITAL_COMMUNITY)
Admission: RE | Disposition: A | Discharge status: Home / Self Care | Payer: Self-pay | Source: Ambulatory Visit | Attending: Ophthalmology

## 2018-02-20 ENCOUNTER — Ambulatory Visit (HOSPITAL_COMMUNITY)
Admission: RE | Admit: 2018-02-20 | Discharge: 2018-02-21 | Disposition: A | Discharge status: Home / Self Care | Payer: Medicare Other | Source: Ambulatory Visit | Attending: Ophthalmology | Admitting: Ophthalmology

## 2018-02-20 DIAGNOSIS — K219 Gastro-esophageal reflux disease without esophagitis: Secondary | ICD-10-CM | POA: Insufficient documentation

## 2018-02-20 DIAGNOSIS — Z79899 Other long term (current) drug therapy: Secondary | ICD-10-CM | POA: Insufficient documentation

## 2018-02-20 DIAGNOSIS — C50511 Malignant neoplasm of lower-outer quadrant of right female breast: Secondary | ICD-10-CM | POA: Diagnosis not present

## 2018-02-20 DIAGNOSIS — I10 Essential (primary) hypertension: Secondary | ICD-10-CM | POA: Insufficient documentation

## 2018-02-20 DIAGNOSIS — Z87891 Personal history of nicotine dependence: Secondary | ICD-10-CM | POA: Diagnosis not present

## 2018-02-20 DIAGNOSIS — H35341 Macular cyst, hole, or pseudohole, right eye: Secondary | ICD-10-CM | POA: Diagnosis not present

## 2018-02-20 DIAGNOSIS — Z853 Personal history of malignant neoplasm of breast: Secondary | ICD-10-CM | POA: Diagnosis not present

## 2018-02-20 DIAGNOSIS — M8580 Other specified disorders of bone density and structure, unspecified site: Secondary | ICD-10-CM | POA: Diagnosis not present

## 2018-02-20 HISTORY — PX: 25 GAUGE PARS PLANA VITRECTOMY WITH 20 GAUGE MVR PORT FOR MACULAR HOLE: SHX6096

## 2018-02-20 HISTORY — DX: Pneumonia, unspecified organism: J18.9

## 2018-02-20 HISTORY — PX: GAS/FLUID EXCHANGE: SHX5334

## 2018-02-20 HISTORY — DX: Macular cyst, hole, or pseudohole, right eye: H35.341

## 2018-02-20 HISTORY — PX: MEMBRANE PEEL: SHX5967

## 2018-02-20 LAB — BASIC METABOLIC PANEL
Anion gap: 13 (ref 5–15)
BUN: 13 mg/dL (ref 6–20)
CALCIUM: 9.2 mg/dL (ref 8.9–10.3)
CO2: 21 mmol/L — AB (ref 22–32)
CREATININE: 1 mg/dL (ref 0.44–1.00)
Chloride: 106 mmol/L (ref 101–111)
GFR calc non Af Amer: 58 mL/min — ABNORMAL LOW (ref >60)
Glucose, Bld: 95 mg/dL (ref 65–99)
Potassium: 3.5 mmol/L (ref 3.5–5.1)
SODIUM: 140 mmol/L (ref 135–145)

## 2018-02-20 LAB — CBC
HEMATOCRIT: 39 % (ref 36.0–46.0)
Hemoglobin: 12.8 g/dL (ref 12.0–15.0)
MCH: 28.4 pg (ref 26.0–34.0)
MCHC: 32.8 g/dL (ref 30.0–36.0)
MCV: 86.7 fL (ref 78.0–100.0)
Platelets: 269 10*3/uL (ref 150–400)
RBC: 4.5 MIL/uL (ref 3.87–5.11)
RDW: 14.5 % (ref 11.5–15.5)
WBC: 6.3 10*3/uL (ref 4.0–10.5)

## 2018-02-20 LAB — AUTOLOGOUS SERUM PATCH PREP

## 2018-02-20 SURGERY — 25 GAUGE PARS PLANA VITRECTOMY WITH 20 GAUGE MVR PORT FOR MACULAR HOLE
Anesthesia: General | Site: Eye | Laterality: Right

## 2018-02-20 MED ORDER — GATIFLOXACIN 0.5 % OP SOLN
1.0000 [drp] | Freq: Four times a day (QID) | OPHTHALMIC | Status: DC
  Filled 2018-02-20: qty 2.5

## 2018-02-20 MED ORDER — 0.9 % SODIUM CHLORIDE (POUR BTL) OPTIME
TOPICAL | Status: DC | PRN
  Administered 2018-02-20: 1000 mL

## 2018-02-20 MED ORDER — DEXAMETHASONE SODIUM PHOSPHATE 10 MG/ML IJ SOLN
INTRAMUSCULAR | Status: AC
  Filled 2018-02-20: qty 1

## 2018-02-20 MED ORDER — LIDOCAINE HCL (CARDIAC) 20 MG/ML IV SOLN
INTRAVENOUS | Status: AC
  Filled 2018-02-20: qty 5

## 2018-02-20 MED ORDER — GATIFLOXACIN 0.5 % OP SOLN
1.0000 [drp] | OPHTHALMIC | Status: AC | PRN
  Administered 2018-02-20 (×3): 1 [drp] via OPHTHALMIC
  Filled 2018-02-20: qty 2.5

## 2018-02-20 MED ORDER — SUGAMMADEX SODIUM 200 MG/2ML IV SOLN
INTRAVENOUS | Status: DC | PRN
  Administered 2018-02-20: 200 mg via INTRAVENOUS

## 2018-02-20 MED ORDER — BSS IO SOLN
INTRAOCULAR | Status: DC | PRN
  Administered 2018-02-20: 15 mL via INTRAOCULAR

## 2018-02-20 MED ORDER — ATROPINE SULFATE 1 % OP SOLN
OPHTHALMIC | Status: AC
  Filled 2018-02-20: qty 5

## 2018-02-20 MED ORDER — SODIUM CHLORIDE 0.9 % IJ SOLN
INTRAMUSCULAR | Status: AC
  Filled 2018-02-20: qty 10

## 2018-02-20 MED ORDER — LIDOCAINE HCL 2 % IJ SOLN
INTRAMUSCULAR | Status: AC
  Filled 2018-02-20: qty 20

## 2018-02-20 MED ORDER — TROPICAMIDE 1 % OP SOLN
1.0000 [drp] | OPHTHALMIC | Status: AC | PRN
  Administered 2018-02-20 (×3): 1 [drp] via OPHTHALMIC
  Filled 2018-02-20: qty 15

## 2018-02-20 MED ORDER — CYCLOPENTOLATE HCL 1 % OP SOLN
1.0000 [drp] | OPHTHALMIC | Status: AC | PRN
  Administered 2018-02-20 (×3): 1 [drp] via OPHTHALMIC
  Filled 2018-02-20: qty 2

## 2018-02-20 MED ORDER — DORZOLAMIDE HCL 2 % OP SOLN
1.0000 [drp] | Freq: Three times a day (TID) | OPHTHALMIC | Status: DC
  Filled 2018-02-20: qty 10

## 2018-02-20 MED ORDER — DEXAMETHASONE SODIUM PHOSPHATE 10 MG/ML IJ SOLN
INTRAMUSCULAR | Status: DC | PRN
  Administered 2018-02-20: 10 mg

## 2018-02-20 MED ORDER — SUGAMMADEX SODIUM 200 MG/2ML IV SOLN
INTRAVENOUS | Status: AC
  Filled 2018-02-20: qty 2

## 2018-02-20 MED ORDER — MORPHINE SULFATE (PF) 4 MG/ML IV SOLN: 1.0000 mg | INTRAVENOUS | Status: DC | PRN

## 2018-02-20 MED ORDER — BUPIVACAINE HCL (PF) 0.75 % IJ SOLN
INTRAMUSCULAR | Status: DC | PRN
  Administered 2018-02-20: 20 mL

## 2018-02-20 MED ORDER — LIDOCAINE 2% (20 MG/ML) 5 ML SYRINGE
INTRAMUSCULAR | Status: DC | PRN
Start: 2018-02-20 — End: 2018-02-20
  Administered 2018-02-20: 80 mg via INTRAVENOUS

## 2018-02-20 MED ORDER — PROPOFOL 10 MG/ML IV BOLUS
INTRAVENOUS | Status: AC
  Filled 2018-02-20: qty 20

## 2018-02-20 MED ORDER — BACITRACIN-POLYMYXIN B 500-10000 UNIT/GM OP OINT
TOPICAL_OINTMENT | OPHTHALMIC | Status: AC
  Filled 2018-02-20: qty 3.5

## 2018-02-20 MED ORDER — STERILE WATER FOR INJECTION IJ SOLN
INTRAMUSCULAR | Status: AC
  Filled 2018-02-20: qty 20

## 2018-02-20 MED ORDER — BRIMONIDINE TARTRATE 0.2 % OP SOLN
1.0000 [drp] | Freq: Two times a day (BID) | OPHTHALMIC | Status: DC
  Filled 2018-02-20: qty 5

## 2018-02-20 MED ORDER — ONDANSETRON HCL 4 MG/2ML IJ SOLN
4.0000 mg | Freq: Four times a day (QID) | INTRAMUSCULAR | Status: DC | PRN
Start: 2018-02-20 — End: 2018-02-21

## 2018-02-20 MED ORDER — EPINEPHRINE PF 1 MG/ML IJ SOLN
INTRAMUSCULAR | Status: DC | PRN
  Administered 2018-02-20: .3 mL

## 2018-02-20 MED ORDER — LACTATED RINGERS IV SOLN: INTRAVENOUS | Status: DC | PRN

## 2018-02-20 MED ORDER — CEFTAZIDIME 1 G IJ SOLR
INTRAMUSCULAR | Status: AC
  Filled 2018-02-20: qty 1

## 2018-02-20 MED ORDER — HYDROCODONE-ACETAMINOPHEN 5-325 MG PO TABS
1.0000 | ORAL_TABLET | ORAL | Status: DC | PRN
  Filled 2018-02-20: qty 1

## 2018-02-20 MED ORDER — ROCURONIUM BROMIDE 10 MG/ML (PF) SYRINGE
PREFILLED_SYRINGE | INTRAVENOUS | Status: AC
  Filled 2018-02-20: qty 5

## 2018-02-20 MED ORDER — SIMVASTATIN 20 MG PO TABS
20.0000 mg | ORAL_TABLET | Freq: Every day | ORAL | Status: DC
  Administered 2018-02-20: 20 mg via ORAL
  Filled 2018-02-20: qty 1

## 2018-02-20 MED ORDER — BACITRACIN-POLYMYXIN B 500-10000 UNIT/GM OP OINT
1.0000 "application " | TOPICAL_OINTMENT | Freq: Three times a day (TID) | OPHTHALMIC | Status: DC
  Filled 2018-02-20: qty 3.5

## 2018-02-20 MED ORDER — BACITRACIN-POLYMYXIN B 500-10000 UNIT/GM OP OINT
TOPICAL_OINTMENT | OPHTHALMIC | Status: DC | PRN
  Administered 2018-02-20: 1 via OPHTHALMIC

## 2018-02-20 MED ORDER — ATROPINE SULFATE 1 % OP SOLN
OPHTHALMIC | Status: DC | PRN
  Administered 2018-02-20: 1 [drp] via OPHTHALMIC

## 2018-02-20 MED ORDER — BUPIVACAINE-EPINEPHRINE (PF) 0.25% -1:200000 IJ SOLN
INTRAMUSCULAR | Status: AC
  Filled 2018-02-20: qty 30

## 2018-02-20 MED ORDER — SODIUM HYALURONATE 10 MG/ML IO SOLN
INTRAOCULAR | Status: DC | PRN
  Administered 2018-02-20: 0.85 mL via INTRAOCULAR

## 2018-02-20 MED ORDER — HYPROMELLOSE (GONIOSCOPIC) 2.5 % OP SOLN
OPHTHALMIC | Status: AC
  Filled 2018-02-20: qty 15

## 2018-02-20 MED ORDER — BSS PLUS IO SOLN
INTRAOCULAR | Status: AC
  Filled 2018-02-20: qty 500

## 2018-02-20 MED ORDER — BSS IO SOLN
INTRAOCULAR | Status: AC
  Filled 2018-02-20: qty 15

## 2018-02-20 MED ORDER — EPINEPHRINE PF 1 MG/ML IJ SOLN
INTRAMUSCULAR | Status: AC
  Filled 2018-02-20: qty 1

## 2018-02-20 MED ORDER — CEFAZOLIN SODIUM-DEXTROSE 2-4 GM/100ML-% IV SOLN
INTRAVENOUS | Status: AC
  Filled 2018-02-20: qty 100

## 2018-02-20 MED ORDER — ONDANSETRON HCL 4 MG/2ML IJ SOLN
INTRAMUSCULAR | Status: DC | PRN
Start: 2018-02-20 — End: 2018-02-20
  Administered 2018-02-20: 4 mg via INTRAVENOUS

## 2018-02-20 MED ORDER — LATANOPROST 0.005 % OP SOLN: 1.0000 [drp] | Freq: Every day | OPHTHALMIC | Status: DC

## 2018-02-20 MED ORDER — TAMOXIFEN CITRATE 10 MG PO TABS
20.0000 mg | ORAL_TABLET | Freq: Every day | ORAL | Status: DC
  Administered 2018-02-20: 20 mg via ORAL
  Filled 2018-02-20 (×2): qty 2

## 2018-02-20 MED ORDER — LACTATED RINGERS IV SOLN
INTRAVENOUS | Status: DC | PRN
  Administered 2018-02-20: 12:00:00 via INTRAVENOUS

## 2018-02-20 MED ORDER — DEXTROSE 5 % IV SOLN
INTRAVENOUS | Status: DC | PRN
  Administered 2018-02-20: 15 ug/min via INTRAVENOUS

## 2018-02-20 MED ORDER — ONDANSETRON HCL 4 MG/2ML IJ SOLN
INTRAMUSCULAR | Status: AC
  Filled 2018-02-20: qty 2

## 2018-02-20 MED ORDER — TEMAZEPAM 15 MG PO CAPS
15.0000 mg | ORAL_CAPSULE | Freq: Every evening | ORAL | Status: DC | PRN
  Administered 2018-02-20: 15 mg via ORAL
  Filled 2018-02-20: qty 1

## 2018-02-20 MED ORDER — TRIAMCINOLONE ACETONIDE 40 MG/ML IJ SUSP
INTRAMUSCULAR | Status: AC
  Filled 2018-02-20: qty 5

## 2018-02-20 MED ORDER — MIDAZOLAM HCL 2 MG/2ML IJ SOLN
INTRAMUSCULAR | Status: AC
  Filled 2018-02-20: qty 2

## 2018-02-20 MED ORDER — DEXAMETHASONE SODIUM PHOSPHATE 10 MG/ML IJ SOLN
INTRAMUSCULAR | Status: DC | PRN
Start: 2018-02-20 — End: 2018-02-20
  Administered 2018-02-20: 10 mg via INTRAVENOUS

## 2018-02-20 MED ORDER — ACETAMINOPHEN 325 MG PO TABS: 325.0000 mg | ORAL_TABLET | ORAL | Status: DC | PRN

## 2018-02-20 MED ORDER — PHENYLEPHRINE HCL 2.5 % OP SOLN
1.0000 [drp] | OPHTHALMIC | Status: AC | PRN
  Administered 2018-02-20 (×3): 1 [drp] via OPHTHALMIC
  Filled 2018-02-20: qty 2

## 2018-02-20 MED ORDER — TETRACAINE HCL 0.5 % OP SOLN
2.0000 [drp] | Freq: Once | OPHTHALMIC | Status: DC
  Filled 2018-02-20: qty 4

## 2018-02-20 MED ORDER — CEFAZOLIN SODIUM-DEXTROSE 2-4 GM/100ML-% IV SOLN
2.0000 g | INTRAVENOUS | Status: AC
  Administered 2018-02-20: 2 g via INTRAVENOUS

## 2018-02-20 MED ORDER — SODIUM CHLORIDE 0.9 % IV SOLN
INTRAVENOUS | Status: DC
  Administered 2018-02-20: 10:00:00 via INTRAVENOUS

## 2018-02-20 MED ORDER — HYDROCODONE-ACETAMINOPHEN 5-325 MG PO TABS
ORAL_TABLET | ORAL | Status: AC
  Administered 2018-02-20: 1
  Filled 2018-02-20: qty 1

## 2018-02-20 MED ORDER — PREDNISOLONE ACETATE 1 % OP SUSP
1.0000 [drp] | Freq: Four times a day (QID) | OPHTHALMIC | Status: DC
  Filled 2018-02-20: qty 5

## 2018-02-20 MED ORDER — FENTANYL CITRATE (PF) 250 MCG/5ML IJ SOLN
INTRAMUSCULAR | Status: AC
  Filled 2018-02-20: qty 5

## 2018-02-20 MED ORDER — MIDAZOLAM HCL 2 MG/2ML IJ SOLN
INTRAMUSCULAR | Status: DC | PRN
Start: 2018-02-20 — End: 2018-02-20
  Administered 2018-02-20: 2 mg via INTRAVENOUS

## 2018-02-20 MED ORDER — FENTANYL CITRATE (PF) 100 MCG/2ML IJ SOLN
INTRAMUSCULAR | Status: DC | PRN
  Administered 2018-02-20 (×3): 50 ug via INTRAVENOUS

## 2018-02-20 MED ORDER — CEFTAZIDIME 1 G IJ SOLR
INTRAMUSCULAR | Status: DC | PRN
  Administered 2018-02-20: 20 mL

## 2018-02-20 MED ORDER — HYALURONIDASE HUMAN 150 UNIT/ML IJ SOLN
INTRAMUSCULAR | Status: AC
  Filled 2018-02-20: qty 1

## 2018-02-20 MED ORDER — PROPOFOL 10 MG/ML IV BOLUS
INTRAVENOUS | Status: DC | PRN
Start: 2018-02-20 — End: 2018-02-20
  Administered 2018-02-20: 150 mg via INTRAVENOUS

## 2018-02-20 MED ORDER — LATANOPROST 0.005 % OP SOLN
1.0000 [drp] | Freq: Every day | OPHTHALMIC | Status: DC
  Filled 2018-02-20: qty 2.5

## 2018-02-20 MED ORDER — SODIUM HYALURONATE 10 MG/ML IO SOLN
INTRAOCULAR | Status: AC
  Filled 2018-02-20: qty 0.85

## 2018-02-20 MED ORDER — MAGNESIUM HYDROXIDE 400 MG/5ML PO SUSP: 15.0000 mL | Freq: Four times a day (QID) | ORAL | Status: DC | PRN

## 2018-02-20 MED ORDER — STERILE WATER FOR IRRIGATION IR SOLN
Status: DC | PRN
  Administered 2018-02-20: 1000 mL

## 2018-02-20 MED ORDER — BSS PLUS IO SOLN
INTRAOCULAR | Status: DC | PRN
  Administered 2018-02-20: 1 via INTRAOCULAR

## 2018-02-20 MED ORDER — BUPIVACAINE HCL (PF) 0.75 % IJ SOLN
INTRAMUSCULAR | Status: AC
  Filled 2018-02-20: qty 10

## 2018-02-20 MED ORDER — ROCURONIUM BROMIDE 50 MG/5ML IV SOSY
PREFILLED_SYRINGE | INTRAVENOUS | Status: DC | PRN
  Administered 2018-02-20: 40 mg via INTRAVENOUS
  Administered 2018-02-20: 20 mg via INTRAVENOUS

## 2018-02-20 MED ORDER — POLYMYXIN B SULFATE 500000 UNITS IJ SOLR
INTRAMUSCULAR | Status: AC
  Filled 2018-02-20: qty 500000

## 2018-02-20 MED ORDER — SODIUM CHLORIDE 0.45 % IV SOLN
INTRAVENOUS | Status: DC
  Administered 2018-02-20: 18:00:00 via INTRAVENOUS

## 2018-02-20 MED ORDER — ACETAZOLAMIDE SODIUM 500 MG IJ SOLR
500.0000 mg | Freq: Once | INTRAMUSCULAR | Status: AC
  Administered 2018-02-21: 500 mg via INTRAVENOUS
  Filled 2018-02-20: qty 500

## 2018-02-20 MED ORDER — ACETAZOLAMIDE SODIUM 500 MG IJ SOLR
INTRAMUSCULAR | Status: AC
  Filled 2018-02-20: qty 500

## 2018-02-20 SURGICAL SUPPLY — 66 items
BALL CTTN LRG ABS STRL LF (GAUZE/BANDAGES/DRESSINGS) ×3
BLADE EYE CATARACT 19 1.4 BEAV (BLADE) IMPLANT
BLADE MVR KNIFE 19G (BLADE) IMPLANT
BLADE MVR KNIFE 20G (BLADE) ×2 IMPLANT
CANNULA VLV SOFT TIP 25G (OPHTHALMIC) ×1 IMPLANT
CANNULA VLV SOFT TIP 25GA (OPHTHALMIC) ×2 IMPLANT
CORDS BIPOLAR (ELECTRODE) ×2 IMPLANT
COTTONBALL LRG STERILE PKG (GAUZE/BANDAGES/DRESSINGS) ×6 IMPLANT
COVER MAYO STAND STRL (DRAPES) IMPLANT
DRAPE INCISE 51X51 W/FILM STRL (DRAPES) IMPLANT
DRAPE OPHTHALMIC 77X100 STRL (CUSTOM PROCEDURE TRAY) ×2 IMPLANT
ERASER HMR WETFIELD 23G BP (MISCELLANEOUS) ×2 IMPLANT
FILTER BLUE MILLIPORE (MISCELLANEOUS) IMPLANT
FILTER STRAW FLUID ASPIR (MISCELLANEOUS) ×2 IMPLANT
FORCEPS GRIESHABER ILM 25G A (INSTRUMENTS) IMPLANT
GAS AUTO FILL CONSTEL (OPHTHALMIC) ×4
GAS AUTO FILL CONSTELLATION (OPHTHALMIC) ×1 IMPLANT
GLOVE SS BIOGEL STRL SZ 6.5 (GLOVE) ×1 IMPLANT
GLOVE SS BIOGEL STRL SZ 7 (GLOVE) ×1 IMPLANT
GLOVE SUPERSENSE BIOGEL SZ 6.5 (GLOVE) ×3
GLOVE SUPERSENSE BIOGEL SZ 7 (GLOVE) ×1
GLOVE SURG 8.5 LATEX PF (GLOVE) ×2 IMPLANT
GOWN STRL REUS W/ TWL LRG LVL3 (GOWN DISPOSABLE) ×3 IMPLANT
GOWN STRL REUS W/TWL LRG LVL3 (GOWN DISPOSABLE) ×6
HANDLE PNEUMATIC FOR CONSTEL (OPHTHALMIC) IMPLANT
KIT BASIN OR (CUSTOM PROCEDURE TRAY) ×2 IMPLANT
KNIFE GRIESHABER SHARP 2.5MM (MISCELLANEOUS) IMPLANT
MICROPICK 25G (MISCELLANEOUS) ×2
NDL 18GX1X1/2 (RX/OR ONLY) (NEEDLE) ×1 IMPLANT
NDL 25GX 5/8IN NON SAFETY (NEEDLE) ×1 IMPLANT
NDL FILTER BLUNT 18X1 1/2 (NEEDLE) IMPLANT
NDL HYPO 30X.5 LL (NEEDLE) IMPLANT
NEEDLE 18GX1X1/2 (RX/OR ONLY) (NEEDLE) ×2 IMPLANT
NEEDLE 25GX 5/8IN NON SAFETY (NEEDLE) ×2 IMPLANT
NEEDLE FILTER BLUNT 18X 1/2SAF (NEEDLE)
NEEDLE FILTER BLUNT 18X1 1/2 (NEEDLE) IMPLANT
NEEDLE HYPO 30X.5 LL (NEEDLE) IMPLANT
NS IRRIG 1000ML POUR BTL (IV SOLUTION) ×2 IMPLANT
PACK FRAGMATOME (OPHTHALMIC) IMPLANT
PACK VITRECTOMY CUSTOM (CUSTOM PROCEDURE TRAY) ×2 IMPLANT
PAD ARMBOARD 7.5X6 YLW CONV (MISCELLANEOUS) ×4 IMPLANT
PAK PIK VITRECTOMY CVS 25GA (OPHTHALMIC) ×2 IMPLANT
PIC ILLUMINATED 25G (OPHTHALMIC) ×2
PICK MICROPICK 25G (MISCELLANEOUS) IMPLANT
PIK ILLUMINATED 25G (OPHTHALMIC) ×1 IMPLANT
PROBE LASER ILLUM FLEX CVD 25G (OPHTHALMIC) IMPLANT
REPL STRA BRUSH NDL (NEEDLE) ×1 IMPLANT
REPL STRA BRUSH NEEDLE (NEEDLE) ×2 IMPLANT
RESERVOIR BACK FLUSH (MISCELLANEOUS) ×2 IMPLANT
ROLLS DENTAL (MISCELLANEOUS) ×4 IMPLANT
SCRAPER DIAMOND 25GA (OPHTHALMIC RELATED) ×1 IMPLANT
SCRAPER DIAMOND DUST MEMBRANE (MISCELLANEOUS) ×2 IMPLANT
SPONGE SURGIFOAM ABS GEL 12-7 (HEMOSTASIS) ×2 IMPLANT
STOPCOCK 4 WAY LG BORE MALE ST (IV SETS) IMPLANT
SUT CHROMIC 7 0 TG140 8 (SUTURE) IMPLANT
SUT ETHILON 9 0 TG140 8 (SUTURE) ×2 IMPLANT
SUT POLY NON ABSORB 10-0 8 STR (SUTURE) IMPLANT
SUT SILK 4 0 RB 1 (SUTURE) IMPLANT
SYR 10ML LL (SYRINGE) ×3 IMPLANT
SYR 20CC LL (SYRINGE) ×2 IMPLANT
SYR 5ML LL (SYRINGE) IMPLANT
SYR BULB 3OZ (MISCELLANEOUS) ×2 IMPLANT
SYR TB 1ML LUER SLIP (SYRINGE) ×2 IMPLANT
TUBING HIGH PRESS EXTEN 6IN (TUBING) IMPLANT
WATER STERILE IRR 1000ML POUR (IV SOLUTION) ×2 IMPLANT
WIPE INSTRUMENT VISIWIPE 73X73 (MISCELLANEOUS) IMPLANT

## 2018-02-20 NOTE — Anesthesia Procedure Notes (Signed)
Procedure Name: Intubation Date/Time: 02/20/2018 11:55 AM Performed by: Genelle Bal, CRNA Pre-anesthesia Checklist: Patient identified, Emergency Drugs available, Suction available and Patient being monitored Patient Re-evaluated:Patient Re-evaluated prior to induction Oxygen Delivery Method: Circle system utilized Preoxygenation: Pre-oxygenation with 100% oxygen Induction Type: IV induction Ventilation: Mask ventilation without difficulty Laryngoscope Size: Miller and 2 Tube type: Oral Tube size: 7.0 mm Number of attempts: 2 Airway Equipment and Method: Stylet and Bougie stylet Placement Confirmation: ETT inserted through vocal cords under direct vision,  positive ETCO2 and breath sounds checked- equal and bilateral Secured at: 22 cm Tube secured with: Tape Dental Injury: Teeth and Oropharynx as per pre-operative assessment  Difficulty Due To: Difficulty was unanticipated and Difficult Airway- due to anterior larynx Future Recommendations: Recommend- induction with short-acting agent, and alternative techniques readily available Comments: DL x1 by CRNA Mil 2 grade 3 view, attempted to pass Bougie unsuccessfully, DL x 1 by MDA Houser, grade 3 view, Bougie x1, 7.0 ETT passed over bougie, +BBS/etCO2 confirmation. Secured @ 22cm @ the lip.

## 2018-02-20 NOTE — Transfer of Care (Signed)
Immediate Anesthesia Transfer of Care Note  Patient: Anita Randolph  Procedure(s) Performed: 25 GAUGE PARS PLANA VITRECTOMY WITH 20 GAUGE MVR PORT WITH SERUM PACTCH FOR MACULAR HOLE, GAS INJECTION, MEMBRANE PEEL RIGHT EYE (Right Eye)  Patient Location: PACU  Anesthesia Type:General  Level of Consciousness: awake, alert  and oriented  Airway & Oxygen Therapy: Patient Spontanous Breathing and Patient connected to face mask oxygen  Post-op Assessment: Report given to RN and Post -op Vital signs reviewed and stable  Post vital signs: Reviewed and stable  Last Vitals:  Vitals Value Taken Time  BP    Temp    Pulse 93 02/20/2018  1:25 PM  Resp 16 02/20/2018  1:25 PM  SpO2 95 % 02/20/2018  1:25 PM  Vitals shown include unvalidated device data.  Last Pain:  Vitals:   02/20/18 0940  TempSrc:   PainSc: 0-No pain      Patients Stated Pain Goal: 7 (74/25/95 [id number])  Complications: No apparent anesthesia complications

## 2018-02-20 NOTE — Brief Op Note (Signed)
02/20/2018  1:20 PM  PATIENT:  Anita Randolph  67 y.o. female  PRE-OPERATIVE DIAGNOSIS:  macular hole right eye  POST-OPERATIVE DIAGNOSIS:  macular hole right eye  PROCEDURE:  Procedure(s): 25 GAUGE PARS PLANA VITRECTOMY WITH 20 GAUGE MVR PORT WITH SERUM PACTCH FOR MACULAR HOLE, GAS INJECTION, MEMBRANE PEEL RIGHT EYE (Right)  SURGEON:  Surgeon(s) and Role:    Hayden Pedro, MD - Primary  Brief Operative note   Preoperative diagnosis:  macular hole right eye Postoperative diagnosis  * No Diagnosis Codes entered *  Procedures: Pars plana vitrectomy, ILM peel, serum patch, laser, gas injection right eye  Surgeon:  Hayden Pedro, MD...  Assistant:  Deatra Ina SA    Anesthesia: General  Specimen: none  Estimated blood loss:  1cc  Complications: none  Patient sent to PACU in good condition  Composed by Hayden Pedro MD  Dictation number: [phone]

## 2018-02-20 NOTE — Anesthesia Preprocedure Evaluation (Addendum)
Anesthesia Evaluation  Patient identified by MRN, date of birth, ID band Patient awake    Reviewed: Allergy & Precautions, NPO status , Patient's Chart, lab work & pertinent test results  Airway Mallampati: II  TM Distance: >3 FB Neck ROM: Full    Dental no notable dental hx.    Pulmonary neg pulmonary ROS, former smoker,    Pulmonary exam normal breath sounds clear to auscultation       Cardiovascular Exercise Tolerance: Good hypertension, negative cardio ROS Normal cardiovascular exam Rhythm:Regular Rate:Normal     Neuro/Psych    GI/Hepatic negative GI ROS,   Endo/Other    Renal/GU      Musculoskeletal   Abdominal   Peds  Hematology   Anesthesia Other Findings   Reproductive/Obstetrics                            Anesthesia Physical Anesthesia Plan  ASA: III  Anesthesia Plan: General   Post-op Pain Management:    Induction: Intravenous  PONV Risk Score and Plan: Treatment may vary due to age or medical condition and Ondansetron  Airway Management Planned: Oral ETT  Additional Equipment:   Intra-op Plan:   Post-operative Plan:   Informed Consent: I have reviewed the patients History and Physical, chart, labs and discussed the procedure including the risks, benefits and alternatives for the proposed anesthesia with the patient or authorized representative who has indicated his/her understanding and acceptance.     Plan Discussed with: CRNA  Anesthesia Plan Comments:        Anesthesia Quick Evaluation

## 2018-02-20 NOTE — H&P (Signed)
I examined the patient today and there is no change in the medical status 

## 2018-02-20 NOTE — Anesthesia Postprocedure Evaluation (Signed)
Anesthesia Post Note  Patient: Anita Randolph  Procedure(s) Performed: 25 GAUGE PARS PLANA VITRECTOMY WITH 20 GAUGE MVR PORT WITH SERUM PACTCH FOR MACULAR HOLE, LASER, C3F8 GAS INJECTION RIGHT EYE (Right Eye) MEMBRANE PEEL (Right Eye) GAS/FLUID EXCHANGE (Right Eye)     Patient location during evaluation: PACU Anesthesia Type: General Level of consciousness: awake and alert Pain management: pain level controlled Vital Signs Assessment: post-procedure vital signs reviewed and stable Respiratory status: spontaneous breathing, nonlabored ventilation, respiratory function stable and patient connected to nasal cannula oxygen Cardiovascular status: blood pressure returned to baseline and stable Postop Assessment: no apparent nausea or vomiting Anesthetic complications: no    Last Vitals:  Vitals:   02/20/18 1415 02/20/18 1443  BP:  123/64  Pulse:  84  Resp:  12  Temp: 36.5 C 36.5 C  SpO2:  95%    Last Pain:  Vitals:   02/20/18 1524  TempSrc:   PainSc: 0-No pain                 Barnet Glasgow

## 2018-02-21 ENCOUNTER — Encounter (HOSPITAL_COMMUNITY): Payer: Self-pay | Admitting: Ophthalmology

## 2018-02-21 DIAGNOSIS — I10 Essential (primary) hypertension: Secondary | ICD-10-CM | POA: Diagnosis not present

## 2018-02-21 DIAGNOSIS — Z79899 Other long term (current) drug therapy: Secondary | ICD-10-CM | POA: Diagnosis not present

## 2018-02-21 DIAGNOSIS — Z853 Personal history of malignant neoplasm of breast: Secondary | ICD-10-CM | POA: Diagnosis not present

## 2018-02-21 DIAGNOSIS — H35341 Macular cyst, hole, or pseudohole, right eye: Secondary | ICD-10-CM | POA: Diagnosis not present

## 2018-02-21 DIAGNOSIS — K219 Gastro-esophageal reflux disease without esophagitis: Secondary | ICD-10-CM | POA: Diagnosis not present

## 2018-02-21 DIAGNOSIS — M8580 Other specified disorders of bone density and structure, unspecified site: Secondary | ICD-10-CM | POA: Diagnosis not present

## 2018-02-21 MED ORDER — SIMVASTATIN 20 MG PO TABS: 20.0000 mg | ORAL_TABLET | Freq: Every day | ORAL | 0 refills | Status: DC

## 2018-02-21 MED ORDER — HYDROCODONE-ACETAMINOPHEN 5-325 MG PO TABS: 1.0000 | ORAL_TABLET | ORAL | 0 refills | Status: DC | PRN

## 2018-02-21 MED ORDER — GATIFLOXACIN 0.5 % OP SOLN: 1.0000 [drp] | Freq: Four times a day (QID) | OPHTHALMIC | Status: DC

## 2018-02-21 MED ORDER — BACITRACIN-POLYMYXIN B 500-10000 UNIT/GM OP OINT: 1.0000 "application " | TOPICAL_OINTMENT | Freq: Three times a day (TID) | OPHTHALMIC | 0 refills | Status: DC

## 2018-02-21 MED ORDER — PREDNISOLONE ACETATE 1 % OP SUSP: 1.0000 [drp] | Freq: Four times a day (QID) | OPHTHALMIC | 0 refills | Status: DC

## 2018-02-21 NOTE — Op Note (Signed)
NAMEDELESA, KAWA               ACCOUNT NO.:  [id number]  MEDICAL RECORD NO.:  [id number]  LOCATION:                                 FACILITY:  PHYSICIAN:  Karman Veney D. Zigmund Daniel, M.D.      DATE OF BIRTH:  DATE OF PROCEDURE:  02/20/2018 DATE OF DISCHARGE:                              OPERATIVE REPORT   ADMISSION DIAGNOSIS:  Macular hole, right eye.  PROCEDURES: 1. Pars plana vitrectomy, right eye. 2. Retinal photocoagulation, right eye. 3. Membrane peel, right eye. 4. Serum patch, right eye. 5. Gas-fluid exchange, right eye.  SURGEON:  Chrystie Nose. Zigmund Daniel, M.D.  ASSISTANT:  Deatra Ina, SA.  ANESTHESIA:  General.  DETAILS:  Usual prep and drape.  The indirect ophthalmoscope laser was moved into place.  614 burns were placed in 2 rows around the retinal periphery in weak areas of the retina.  The power was between 400-800 mW, 1000 microns each and 0.1 seconds each.  The attention was carried to the pars plana area where 25-gauge trocars were placed at 8 and 10 o'clock, conjunctival incision at 2 o'clock with a 3-layered scleral incision with the diamond knife at 2 mm back from the limbus, MVR incision 4 mm back from the limbus through the scleral opening.  The contact lens ring was anchored into place at 6 and 12 o'clock.  Provisc placed on the corneal surface and the flat contact lens ring was placed. Pars plana vitrectomy was begun just behind the cataractous lens.  The vitrectomy was carried to the midvitreous where white membranes were encountered.  These were carefully removed under low suction and rapid cutting.  The vitrectomy was carried posteriorly where additional white membranes were seen.  The core vitrectomy was carried out centrally. The macular hole jiggled and moved as the posterior membranes were removed.  The silicone tip suction line was brought into the eye and drawn down towards the macular hole.  A fish strike sign occurred and posterior membranes were  stripped with the vitreous pick and the silicone tip suction line.  The cutter was reintroduced in the eye and additional vitreous was removed.  A 30-degree prismatic lens was exchanged for the flat contact lens for optimum viewing of the periphery.  The vitrectomy was carried into the mid periphery and then into the far periphery out down to the vitreous base for 360 degrees. All vitreous was removed.  Once this was accomplished, the methylcellulose or Provisc was placed on the corneal surface and the magnifying contact lens was placed.  The diamond-dusted membrane scraper, 20-gauge, was used to engage the internal-limiting membrane and peel it from its attachment to the edges of the macular hole.  The macular hole was freed and the excess internal-limiting membrane fragments were removed with the vitreous cutter.  A total gas-fluid exchange was carried out at this point and sufficient time was allowed to pass, so the fluid would track down the walls of the eye and collect in the posterior segment.  Serum patch was prepared during this time and C3F8 was prepared to a 14% concentration.  The posterior fluid was removed with the [place] Ophthalmic brush.  Serum patch was delivered.  Extra serum was then removed with the [place] Ophthalmic brush.  C3F8 was exchanged for intravitreal gas and the instruments were removed from the eye.  9-0 nylon was used to close the sclerotomy site at 2 o'clock.  The conjunctiva was closed with wet-field cautery.  The 25-gauge trocars were removed individually.  The wounds were tested and found to be secure.  The conjunctiva was closed with wet-field cautery at 2 o'clock. Polymyxin and ceftazidime were rinsed around the globe for antibiotic coverage.  Decadron 10 mg was injected into the lower subconjunctival space.  Atropine solution was applied.  Marcaine was injected around the globe for postop pain.  Closing pressure was 10 with a [place] tonometer.   Polysporin ophthalmic ointment, patch, and shield were placed.  COMPLICATIONS:  None.  DURATION:  1 hour 30 minutes.  The patient was awakened, taken to recovery in satisfactory condition.     Chrystie Nose. Zigmund Daniel, M.D.   ______________________________ Chrystie Nose. Zigmund Daniel, M.D.    JDM/MEDQ  D:  02/20/2018  T:  02/21/2018  Job:  [id number]

## 2018-02-21 NOTE — Progress Notes (Signed)
02/21/2018, 6:26 AM  Mental Status:  Awake, Alert, Oriented  Anterior segment: Cornea  Clear    Anterior Chamber Clear    Lens:   Cataract  Intra Ocular Pressure 19 mmHg with Tonopen  Vitreous: Clear 99%gas bubble   Retina:  Attached Good laser reaction   Impression: Excellent result Retina attached   Final Diagnosis: Active Problems:   Macular hole, right eye   Plan: start post operative eye drops.  Discharge to home.  Give post operative instructions  Hayden Pedro 02/21/2018, 6:26 AM

## 2018-02-21 NOTE — Discharge Summary (Signed)
Discharge summary not needed on [facility] patients per medical records. 

## 2018-02-27 ENCOUNTER — Encounter (INDEPENDENT_AMBULATORY_CARE_PROVIDER_SITE_OTHER): Payer: Medicare Other | Admitting: Ophthalmology

## 2018-02-27 DIAGNOSIS — H35341 Macular cyst, hole, or pseudohole, right eye: Secondary | ICD-10-CM

## 2018-03-01 DIAGNOSIS — H33029 Retinal detachment with multiple breaks, unspecified eye: Secondary | ICD-10-CM | POA: Diagnosis present

## 2018-03-01 NOTE — H&P (Signed)
Anita Randolph is an 67 y.o. female.   Chief Complaint: retinal detachment right eye HPI:  Had macular hole surgery3-26-19.  Retinal detachment on 02-27-2018  Past Medical History:  Diagnosis Date  . Anemia    s/p chemotherapy  . Breast cancer ([place]) 11/15/12   Right invasive ductal ca  . Cancer [facility])    breast  . 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 Fibroid 2007   largest 5x5x5cm  . GERD (gastroesophageal reflux disease)    only during chemotherapy  . Hypertension   . Macular hole of right eye   . Microscopic hematuria    negative w/u  . Neuromuscular disorder ([facility])    tingling hands and feet from chemo-getting better  . Osteopenia due to cancer therapy   . Personal history of chemotherapy   . Personal history of radiation therapy   . Pneumonia   . Radiation 06/06/13-07/22/13   Right breast    Past Surgical History:  Procedure Laterality Date  . [place] VITRECTOMY WITH 20 GAUGE MVR PORT FOR MACULAR HOLE Right 02/20/2018  . 25 GAUGE PARS PLANA VITRECTOMY WITH 20 GAUGE MVR PORT FOR MACULAR HOLE Right 02/20/2018   Procedure: 25 GAUGE PARS PLANA VITRECTOMY WITH 20 GAUGE MVR PORT WITH SERUM PACTCH FOR MACULAR HOLE, LASER, C3F8 GAS INJECTION RIGHT EYE;  Surgeon: Hayden Pedro, MD;  Location: [place];  Service: Ophthalmology;  Laterality: Right;  . ARM SKIN LESION BIOPSY / EXCISION  2011   nodular faciitis lt arm  . BREAST LUMPECTOMY    . BREAST LUMPECTOMY WITH NEEDLE LOCALIZATION AND AXILLARY SENTINEL LYMPH NODE BX Right 05/07/2013   Procedure: NEEDLE LOCALIZATION RIGHT BREAST LUMPECTOMY AND   SENTINEL LYMPH NODE  ;  Surgeon: Haywood Lasso, MD;  Location: [place];  Service: General;  Laterality: Right;  . BREAST SURGERY    . COLONOSCOPY    . GAS/FLUID EXCHANGE Right 02/20/2018   Procedure: GAS/FLUID EXCHANGE;  Surgeon: Hayden Pedro, MD;  Location: [place];  Service: Ophthalmology;  Laterality: Right;  . LIPOMA EXCISION     -right back(age 44)  . MEMBRANE PEEL Right 02/20/2018   Procedure: MEMBRANE PEEL;  Surgeon: Hayden Pedro, MD;  Location: [place];  Service: Ophthalmology;  Laterality: Right;  . PORTACATH PLACEMENT  12/20/2012   Procedure: INSERTION PORT-A-CATH;  Surgeon: Haywood Lasso, MD;  Location: [facility];  Service: General;  Laterality: N/A;  . TONSILLECTOMY      Family History  Problem Relation Age of Onset  . Breast cancer Mother 8  . Osteoporosis Mother   . Diabetes Mother   . Thyroid disease Mother   . Heart disease Father   . Bladder Cancer Father 28  . Breast cancer Maternal Aunt        diagnosed in her 28s  . Multiple sclerosis Paternal Aunt   . Heart disease Maternal Grandmother   . Diabetes Maternal Grandmother   . Mental retardation Cousin        maternal cousins; brothers, sons of aunt with breast cancer   Social History:  reports that she quit smoking about 43 years ago. Her smoking use included cigarettes. She has a 4.00 pack-year smoking history. She has never used smokeless tobacco. She reports that she drinks about 1.0 oz of alcohol per week. She reports that she does not use drugs.  Allergies:  Allergies  Allergen Reactions  . Augmentin [Amoxicillin-Pot Clavulanate] Nausea And Vomiting  Upset stomach  . Sulfa Antibiotics Hives, Itching, Rash and Other (See Comments)    High fever    No medications prior to admission.    Review of systems otherwise negative  Last menstrual period 11/28/2004.  Physical exam: Mental status: oriented x3. Eyes: See eye exam associated with this date of surgery in media tab.  Scanned in by scanning center Ears, Nose, Throat: within normal limits Neck: Within Normal limits General: within normal limits Chest: Within normal limits Breast: deferred Heart: Within normal limits Abdomen: Within normal limits GU: deferred Extremities: within normal limits Skin: within normal limits  Assessment/Plan Rhegmatogenous retinal  detachment right eye Plan: To [facility] for Scleral buckle, laser, gas injection, possible pars plana vitrectomy right eye  Hayden Pedro 03/01/2018, 7:41 AM

## 2018-03-05 ENCOUNTER — Encounter (INDEPENDENT_AMBULATORY_CARE_PROVIDER_SITE_OTHER): Payer: Medicare Other | Admitting: Ophthalmology

## 2018-03-05 ENCOUNTER — Other Ambulatory Visit: Payer: Self-pay

## 2018-03-05 ENCOUNTER — Encounter (HOSPITAL_COMMUNITY): Payer: Self-pay | Admitting: *Deleted

## 2018-03-05 DIAGNOSIS — H338 Other retinal detachments: Secondary | ICD-10-CM

## 2018-03-05 NOTE — Progress Notes (Signed)
Pt denies SOB, chest pain, and being under the care of a cardiologist. Pt denies having a stress test and cardiac cath.  Pt stated that MD made her aware to stop taking  Aspirin,  Vitamins, fish oil and herbal medications. Do not take any NSAIDs ie: Ibuprofen, Advil, Naproxen or any medication containing Aspirin. Pt verbalized understanding of all pre-op instructions.

## 2018-03-06 ENCOUNTER — Ambulatory Visit (HOSPITAL_COMMUNITY): Payer: Medicare Other | Admitting: Anesthesiology

## 2018-03-06 ENCOUNTER — Encounter (HOSPITAL_COMMUNITY): Payer: Self-pay | Admitting: *Deleted

## 2018-03-06 ENCOUNTER — Ambulatory Visit (HOSPITAL_COMMUNITY)
Admission: RE | Admit: 2018-03-06 | Discharge: 2018-03-07 | Disposition: A | Discharge status: Home / Self Care | Payer: Medicare Other | Source: Ambulatory Visit | Attending: Ophthalmology | Admitting: Ophthalmology

## 2018-03-06 ENCOUNTER — Encounter (HOSPITAL_COMMUNITY)
Admission: RE | Disposition: A | Discharge status: Home / Self Care | Payer: Self-pay | Source: Ambulatory Visit | Attending: Ophthalmology

## 2018-03-06 DIAGNOSIS — Z882 Allergy status to sulfonamides status: Secondary | ICD-10-CM | POA: Diagnosis not present

## 2018-03-06 DIAGNOSIS — I10 Essential (primary) hypertension: Secondary | ICD-10-CM | POA: Insufficient documentation

## 2018-03-06 DIAGNOSIS — H33001 Unspecified retinal detachment with retinal break, right eye: Secondary | ICD-10-CM | POA: Diagnosis not present

## 2018-03-06 DIAGNOSIS — K219 Gastro-esophageal reflux disease without esophagitis: Secondary | ICD-10-CM | POA: Insufficient documentation

## 2018-03-06 DIAGNOSIS — H33029 Retinal detachment with multiple breaks, unspecified eye: Secondary | ICD-10-CM

## 2018-03-06 DIAGNOSIS — Z881 Allergy status to other antibiotic agents status: Secondary | ICD-10-CM | POA: Insufficient documentation

## 2018-03-06 DIAGNOSIS — Z9221 Personal history of antineoplastic chemotherapy: Secondary | ICD-10-CM | POA: Insufficient documentation

## 2018-03-06 DIAGNOSIS — H3321 Serous retinal detachment, right eye: Secondary | ICD-10-CM | POA: Diagnosis not present

## 2018-03-06 DIAGNOSIS — H35341 Macular cyst, hole, or pseudohole, right eye: Secondary | ICD-10-CM | POA: Diagnosis not present

## 2018-03-06 DIAGNOSIS — Z87891 Personal history of nicotine dependence: Secondary | ICD-10-CM | POA: Diagnosis not present

## 2018-03-06 DIAGNOSIS — Z853 Personal history of malignant neoplasm of breast: Secondary | ICD-10-CM | POA: Diagnosis not present

## 2018-03-06 DIAGNOSIS — Z923 Personal history of irradiation: Secondary | ICD-10-CM | POA: Diagnosis not present

## 2018-03-06 DIAGNOSIS — H338 Other retinal detachments: Secondary | ICD-10-CM | POA: Diagnosis not present

## 2018-03-06 HISTORY — PX: LASER PHOTO ABLATION: SHX5942

## 2018-03-06 HISTORY — PX: GAS INSERTION: SHX5336

## 2018-03-06 HISTORY — PX: SCLERAL BUCKLE WITH POSSIBLE 25 GAUGE PARS PLANA VITRECTOMY: SHX6206

## 2018-03-06 HISTORY — DX: Unspecified retinal detachment with retinal break, right eye: H33.001

## 2018-03-06 HISTORY — PX: MEMBRANE PEEL: SHX5967

## 2018-03-06 LAB — AUTOLOGOUS SERUM PATCH PREP

## 2018-03-06 SURGERY — SCLERAL BUCKLE WITH POSSIBLE 25 GAUGE PARS PLANA VITRECTOMY
Anesthesia: General | Site: Eye | Laterality: Right

## 2018-03-06 MED ORDER — EPINEPHRINE PF 1 MG/ML IJ SOLN
INTRAMUSCULAR | Status: AC
  Filled 2018-03-06: qty 1

## 2018-03-06 MED ORDER — TAMOXIFEN CITRATE 10 MG PO TABS
20.0000 mg | ORAL_TABLET | Freq: Every day | ORAL | Status: DC
  Administered 2018-03-06: 20 mg via ORAL
  Filled 2018-03-06 (×2): qty 2

## 2018-03-06 MED ORDER — SIMVASTATIN 20 MG PO TABS
20.0000 mg | ORAL_TABLET | Freq: Every day | ORAL | Status: DC
  Administered 2018-03-06: 20 mg via ORAL
  Filled 2018-03-06: qty 1

## 2018-03-06 MED ORDER — LIDOCAINE 2% (20 MG/ML) 5 ML SYRINGE
INTRAMUSCULAR | Status: AC
  Filled 2018-03-06: qty 5

## 2018-03-06 MED ORDER — FENTANYL CITRATE (PF) 100 MCG/2ML IJ SOLN
INTRAMUSCULAR | Status: DC | PRN
  Administered 2018-03-06: 100 ug via INTRAVENOUS
  Administered 2018-03-06: 50 ug via INTRAVENOUS
  Administered 2018-03-06: 100 ug via INTRAVENOUS

## 2018-03-06 MED ORDER — BUPIVACAINE HCL (PF) 0.75 % IJ SOLN
INTRAMUSCULAR | Status: DC | PRN
  Administered 2018-03-06: 10 mL

## 2018-03-06 MED ORDER — ONDANSETRON HCL 4 MG/2ML IJ SOLN: 4.0000 mg | Freq: Four times a day (QID) | INTRAMUSCULAR | Status: DC | PRN

## 2018-03-06 MED ORDER — TRIAMCINOLONE ACETONIDE 40 MG/ML IJ SUSP
INTRAMUSCULAR | Status: AC
  Filled 2018-03-06: qty 5

## 2018-03-06 MED ORDER — LIDOCAINE HCL 2 % IJ SOLN
INTRAMUSCULAR | Status: AC
  Filled 2018-03-06: qty 20

## 2018-03-06 MED ORDER — POLYMYXIN B SULFATE 500000 UNITS IJ SOLR
INTRAMUSCULAR | Status: AC
  Filled 2018-03-06: qty 500000

## 2018-03-06 MED ORDER — DEXAMETHASONE SODIUM PHOSPHATE 10 MG/ML IJ SOLN
INTRAMUSCULAR | Status: DC | PRN
  Administered 2018-03-06: 10 mg

## 2018-03-06 MED ORDER — FENTANYL CITRATE (PF) 250 MCG/5ML IJ SOLN
INTRAMUSCULAR | Status: AC
  Filled 2018-03-06: qty 5

## 2018-03-06 MED ORDER — DORZOLAMIDE HCL 2 % OP SOLN
1.0000 [drp] | Freq: Three times a day (TID) | OPHTHALMIC | Status: DC
  Filled 2018-03-06: qty 10

## 2018-03-06 MED ORDER — SODIUM CHLORIDE 0.45 % IV SOLN
INTRAVENOUS | Status: DC
  Administered 2018-03-06: 20:00:00 via INTRAVENOUS

## 2018-03-06 MED ORDER — HYPROMELLOSE (GONIOSCOPIC) 2.5 % OP SOLN
OPHTHALMIC | Status: AC
  Filled 2018-03-06: qty 15

## 2018-03-06 MED ORDER — SUGAMMADEX SODIUM 200 MG/2ML IV SOLN
INTRAVENOUS | Status: AC
  Filled 2018-03-06: qty 2

## 2018-03-06 MED ORDER — ONDANSETRON HCL 4 MG/2ML IJ SOLN
INTRAMUSCULAR | Status: AC
  Filled 2018-03-06: qty 2

## 2018-03-06 MED ORDER — PROPOFOL 10 MG/ML IV BOLUS
INTRAVENOUS | Status: DC | PRN
  Administered 2018-03-06: 150 mg via INTRAVENOUS

## 2018-03-06 MED ORDER — VITAMIN D 1000 UNITS PO TABS
1000.0000 [IU] | ORAL_TABLET | Freq: Every day | ORAL | Status: DC
  Administered 2018-03-06: 1000 [IU] via ORAL
  Filled 2018-03-06: qty 1

## 2018-03-06 MED ORDER — FENTANYL CITRATE (PF) 100 MCG/2ML IJ SOLN
25.0000 ug | INTRAMUSCULAR | Status: DC | PRN
  Administered 2018-03-06 (×2): 50 ug via INTRAVENOUS

## 2018-03-06 MED ORDER — BSS PLUS IO SOLN
INTRAOCULAR | Status: DC | PRN
  Administered 2018-03-06: 14:00:00

## 2018-03-06 MED ORDER — PROPOFOL 10 MG/ML IV BOLUS
INTRAVENOUS | Status: AC
  Filled 2018-03-06: qty 40

## 2018-03-06 MED ORDER — HYDROCODONE-ACETAMINOPHEN 5-325 MG PO TABS: 1.0000 | ORAL_TABLET | ORAL | Status: DC | PRN

## 2018-03-06 MED ORDER — SODIUM HYALURONATE 10 MG/ML IO SOLN
INTRAOCULAR | Status: AC
  Filled 2018-03-06: qty 0.85

## 2018-03-06 MED ORDER — BSS PLUS IO SOLN
INTRAOCULAR | Status: AC
  Filled 2018-03-06: qty 500

## 2018-03-06 MED ORDER — DEXAMETHASONE SODIUM PHOSPHATE 10 MG/ML IJ SOLN
INTRAMUSCULAR | Status: DC | PRN
  Administered 2018-03-06: 10 mg via INTRAVENOUS

## 2018-03-06 MED ORDER — MIDAZOLAM HCL 5 MG/5ML IJ SOLN
INTRAMUSCULAR | Status: DC | PRN
  Administered 2018-03-06: 2 mg via INTRAVENOUS

## 2018-03-06 MED ORDER — OXYCODONE HCL 5 MG/5ML PO SOLN: 5.0000 mg | Freq: Once | ORAL | Status: DC | PRN

## 2018-03-06 MED ORDER — TROPICAMIDE 1 % OP SOLN
1.0000 [drp] | OPHTHALMIC | Status: AC
  Administered 2018-03-06 (×3): 1 [drp] via OPHTHALMIC
  Filled 2018-03-06: qty 15

## 2018-03-06 MED ORDER — HYDROCODONE-ACETAMINOPHEN 5-325 MG PO TABS
1.0000 | ORAL_TABLET | ORAL | Status: DC | PRN
  Administered 2018-03-06: 2 via ORAL
  Filled 2018-03-06: qty 2

## 2018-03-06 MED ORDER — LABETALOL HCL 5 MG/ML IV SOLN
INTRAVENOUS | Status: DC | PRN
  Administered 2018-03-06: 5 mg via INTRAVENOUS

## 2018-03-06 MED ORDER — DEXAMETHASONE SODIUM PHOSPHATE 10 MG/ML IJ SOLN
INTRAMUSCULAR | Status: AC
  Filled 2018-03-06: qty 1

## 2018-03-06 MED ORDER — BSS IO SOLN
INTRAOCULAR | Status: AC
  Filled 2018-03-06: qty 15

## 2018-03-06 MED ORDER — ROCURONIUM BROMIDE 10 MG/ML (PF) SYRINGE
PREFILLED_SYRINGE | INTRAVENOUS | Status: AC
  Filled 2018-03-06: qty 10

## 2018-03-06 MED ORDER — TETRACAINE HCL 0.5 % OP SOLN
2.0000 [drp] | Freq: Once | OPHTHALMIC | Status: DC
  Filled 2018-03-06: qty 4

## 2018-03-06 MED ORDER — SIMVASTATIN 20 MG PO TABS: 20.0000 mg | ORAL_TABLET | Freq: Every day | ORAL | Status: DC

## 2018-03-06 MED ORDER — BACITRACIN-POLYMYXIN B 500-10000 UNIT/GM OP OINT
TOPICAL_OINTMENT | OPHTHALMIC | Status: DC | PRN
  Administered 2018-03-06: 1 via OPHTHALMIC

## 2018-03-06 MED ORDER — FENTANYL CITRATE (PF) 100 MCG/2ML IJ SOLN
INTRAMUSCULAR | Status: AC
  Filled 2018-03-06: qty 2

## 2018-03-06 MED ORDER — CEFTAZIDIME 1 G IJ SOLR
INTRAMUSCULAR | Status: AC
  Filled 2018-03-06: qty 1

## 2018-03-06 MED ORDER — PREDNISOLONE ACETATE 1 % OP SUSP
1.0000 [drp] | Freq: Four times a day (QID) | OPHTHALMIC | Status: DC
  Filled 2018-03-06: qty 5

## 2018-03-06 MED ORDER — OXYCODONE HCL 5 MG PO TABS: 5.0000 mg | ORAL_TABLET | Freq: Once | ORAL | Status: DC | PRN

## 2018-03-06 MED ORDER — BUPIVACAINE HCL (PF) 0.75 % IJ SOLN
INTRAMUSCULAR | Status: AC
  Filled 2018-03-06: qty 10

## 2018-03-06 MED ORDER — ATROPINE SULFATE 1 % OP SOLN
OPHTHALMIC | Status: AC
  Filled 2018-03-06: qty 5

## 2018-03-06 MED ORDER — MORPHINE SULFATE (PF) 4 MG/ML IV SOLN: 1.0000 mg | INTRAVENOUS | Status: DC | PRN

## 2018-03-06 MED ORDER — SODIUM CHLORIDE 0.9 % IJ SOLN
INTRAMUSCULAR | Status: AC
  Filled 2018-03-06: qty 10

## 2018-03-06 MED ORDER — BACITRACIN-POLYMYXIN B 500-10000 UNIT/GM OP OINT
1.0000 "application " | TOPICAL_OINTMENT | Freq: Three times a day (TID) | OPHTHALMIC | Status: DC
  Filled 2018-03-06: qty 3.5

## 2018-03-06 MED ORDER — GATIFLOXACIN 0.5 % OP SOLN: 1.0000 [drp] | Freq: Four times a day (QID) | OPHTHALMIC | Status: DC

## 2018-03-06 MED ORDER — STERILE WATER FOR INJECTION IJ SOLN
INTRAMUSCULAR | Status: DC | PRN
  Administered 2018-03-06: 20 mL

## 2018-03-06 MED ORDER — MAGNESIUM HYDROXIDE 400 MG/5ML PO SUSP: 15.0000 mL | Freq: Four times a day (QID) | ORAL | Status: DC | PRN

## 2018-03-06 MED ORDER — BACITRACIN-POLYMYXIN B 500-10000 UNIT/GM OP OINT
TOPICAL_OINTMENT | OPHTHALMIC | Status: AC
  Filled 2018-03-06: qty 3.5

## 2018-03-06 MED ORDER — LABETALOL HCL 5 MG/ML IV SOLN
INTRAVENOUS | Status: AC
  Filled 2018-03-06: qty 4

## 2018-03-06 MED ORDER — DENOSUMAB 60 MG/ML ~~LOC~~ SOLN: 60.0000 mg | SUBCUTANEOUS | Status: DC

## 2018-03-06 MED ORDER — SODIUM CHLORIDE 0.9 % IV SOLN
INTRAVENOUS | Status: DC
Start: 2018-03-06 — End: 2018-03-06
  Administered 2018-03-06 (×2): via INTRAVENOUS

## 2018-03-06 MED ORDER — LATANOPROST 0.005 % OP SOLN
1.0000 [drp] | Freq: Every day | OPHTHALMIC | Status: DC
  Filled 2018-03-06: qty 2.5

## 2018-03-06 MED ORDER — ATROPINE SULFATE 1 % OP SOLN
OPHTHALMIC | Status: DC | PRN
  Administered 2018-03-06: 1 [drp] via OPHTHALMIC

## 2018-03-06 MED ORDER — DORZOLAMIDE HCL-TIMOLOL MAL 2-0.5 % OP SOLN
1.0000 [drp] | Freq: Once | OPHTHALMIC | Status: AC
Start: 2018-03-06 — End: 2018-03-06
  Administered 2018-03-06: 1 [drp] via OPHTHALMIC
  Filled 2018-03-06: qty 10

## 2018-03-06 MED ORDER — CYCLOPENTOLATE HCL 1 % OP SOLN
1.0000 [drp] | OPHTHALMIC | Status: AC
  Administered 2018-03-06 (×3): 1 [drp] via OPHTHALMIC
  Filled 2018-03-06: qty 2

## 2018-03-06 MED ORDER — TEMAZEPAM 15 MG PO CAPS
15.0000 mg | ORAL_CAPSULE | Freq: Every evening | ORAL | Status: DC | PRN
  Administered 2018-03-06: 15 mg via ORAL
  Filled 2018-03-06: qty 1

## 2018-03-06 MED ORDER — GATIFLOXACIN 0.5 % OP SOLN
1.0000 [drp] | OPHTHALMIC | Status: AC
  Administered 2018-03-06 (×3): 1 [drp] via OPHTHALMIC
  Filled 2018-03-06: qty 2.5

## 2018-03-06 MED ORDER — CEFAZOLIN SODIUM-DEXTROSE 2-4 GM/100ML-% IV SOLN
2.0000 g | INTRAVENOUS | Status: AC
  Administered 2018-03-06: 2 g via INTRAVENOUS
  Filled 2018-03-06: qty 100

## 2018-03-06 MED ORDER — SUGAMMADEX SODIUM 200 MG/2ML IV SOLN
INTRAVENOUS | Status: DC | PRN
  Administered 2018-03-06: 143.4 mg via INTRAVENOUS

## 2018-03-06 MED ORDER — ACETAMINOPHEN 325 MG PO TABS
325.0000 mg | ORAL_TABLET | ORAL | Status: DC | PRN
  Administered 2018-03-07: 650 mg via ORAL
  Filled 2018-03-06: qty 2

## 2018-03-06 MED ORDER — HEMOSTATIC AGENTS (NO CHARGE) OPTIME
TOPICAL | Status: DC | PRN
  Administered 2018-03-06: 1 via TOPICAL

## 2018-03-06 MED ORDER — BRIMONIDINE TARTRATE 0.2 % OP SOLN
1.0000 [drp] | Freq: Two times a day (BID) | OPHTHALMIC | Status: DC
  Filled 2018-03-06: qty 5

## 2018-03-06 MED ORDER — BACITRACIN-POLYMYXIN B 500-10000 UNIT/GM OP OINT: 1.0000 "application " | TOPICAL_OINTMENT | Freq: Three times a day (TID) | OPHTHALMIC | Status: DC

## 2018-03-06 MED ORDER — ACETAZOLAMIDE SODIUM 500 MG IJ SOLR
500.0000 mg | Freq: Once | INTRAMUSCULAR | Status: AC
  Administered 2018-03-07: 500 mg via INTRAVENOUS
  Filled 2018-03-06: qty 500

## 2018-03-06 MED ORDER — MIDAZOLAM HCL 2 MG/2ML IJ SOLN
INTRAMUSCULAR | Status: AC
  Filled 2018-03-06: qty 2

## 2018-03-06 MED ORDER — PHENYLEPHRINE HCL 2.5 % OP SOLN
1.0000 [drp] | OPHTHALMIC | Status: AC
  Administered 2018-03-06 (×3): 1 [drp] via OPHTHALMIC
  Filled 2018-03-06: qty 2

## 2018-03-06 MED ORDER — ACETAZOLAMIDE SODIUM 500 MG IJ SOLR
INTRAMUSCULAR | Status: AC
  Filled 2018-03-06: qty 500

## 2018-03-06 MED ORDER — PREDNISOLONE ACETATE 1 % OP SUSP: 1.0000 [drp] | Freq: Four times a day (QID) | OPHTHALMIC | Status: DC

## 2018-03-06 MED ORDER — LIDOCAINE HCL (CARDIAC) 20 MG/ML IV SOLN
INTRAVENOUS | Status: DC | PRN
  Administered 2018-03-06: 30 mg via INTRAVENOUS

## 2018-03-06 MED ORDER — CALCIUM CARBONATE-VITAMIN D 500-200 MG-UNIT PO TABS
1.0000 | ORAL_TABLET | Freq: Two times a day (BID) | ORAL | Status: DC
  Administered 2018-03-06: 1 via ORAL
  Filled 2018-03-06 (×3): qty 1

## 2018-03-06 MED ORDER — GATIFLOXACIN 0.5 % OP SOLN
1.0000 [drp] | Freq: Four times a day (QID) | OPHTHALMIC | Status: DC
  Filled 2018-03-06: qty 2.5

## 2018-03-06 MED ORDER — ONDANSETRON HCL 4 MG/2ML IJ SOLN
INTRAMUSCULAR | Status: DC | PRN
  Administered 2018-03-06: 4 mg via INTRAVENOUS

## 2018-03-06 MED ORDER — ROCURONIUM BROMIDE 100 MG/10ML IV SOLN
INTRAVENOUS | Status: DC | PRN
  Administered 2018-03-06: 10 mg via INTRAVENOUS
  Administered 2018-03-06: 50 mg via INTRAVENOUS
  Administered 2018-03-06: 10 mg via INTRAVENOUS
  Administered 2018-03-06: 5 mg via INTRAVENOUS

## 2018-03-06 MED ORDER — STERILE WATER FOR INJECTION IJ SOLN
INTRAMUSCULAR | Status: AC
  Filled 2018-03-06: qty 20

## 2018-03-06 MED ORDER — SODIUM HYALURONATE 10 MG/ML IO SOLN
INTRAOCULAR | Status: DC | PRN
  Administered 2018-03-06: 0.85 mL via INTRAOCULAR

## 2018-03-06 SURGICAL SUPPLY — 81 items
APL SRG 3 HI ABS STRL LF PLS (MISCELLANEOUS) ×10
APPLICATOR DR MATTHEWS STRL (MISCELLANEOUS) ×18 IMPLANT
BALL CTTN LRG ABS STRL LF (GAUZE/BANDAGES/DRESSINGS) ×3
BAND SCLERAL BUCKLING TYPE 240 (Ophthalmic Related) ×1 IMPLANT
BANDAGE EYE OVAL (MISCELLANEOUS) IMPLANT
BLADE EYE CATARACT 19 1.4 BEAV (BLADE) IMPLANT
BLADE MVR KNIFE 19G (BLADE) IMPLANT
CANNULA ANT CHAM MAIN (OPHTHALMIC RELATED) ×1 IMPLANT
CANNULA DUAL BORE 23G (CANNULA) ×1 IMPLANT
CANNULA VLV SOFT TIP 25G (OPHTHALMIC) IMPLANT
CANNULA VLV SOFT TIP 25GA (OPHTHALMIC) ×2 IMPLANT
CORDS BIPOLAR (ELECTRODE) IMPLANT
COTTONBALL LRG STERILE PKG (GAUZE/BANDAGES/DRESSINGS) ×6 IMPLANT
COVER SURGICAL LIGHT HANDLE (MISCELLANEOUS) ×2 IMPLANT
DRAPE OPHTHALMIC 77X100 STRL (CUSTOM PROCEDURE TRAY) ×2 IMPLANT
ERASER HMR WETFIELD 23G BP (MISCELLANEOUS) IMPLANT
FILTER BLUE MILLIPORE (MISCELLANEOUS) ×4 IMPLANT
FILTER STRAW FLUID ASPIR (MISCELLANEOUS) IMPLANT
FORCEPS GRIESHABER ILM 25G A (INSTRUMENTS) IMPLANT
GAS OPHTHALMIC (MISCELLANEOUS) IMPLANT
GLOVE SS BIOGEL STRL SZ 6.5 (GLOVE) ×1 IMPLANT
GLOVE SS BIOGEL STRL SZ 7 (GLOVE) ×1 IMPLANT
GLOVE SUPERSENSE BIOGEL SZ 6.5 (GLOVE) ×1
GLOVE SUPERSENSE BIOGEL SZ 7 (GLOVE) ×1
GLOVE SURG 8.5 LATEX PF (GLOVE) ×2 IMPLANT
GOWN STRL REUS W/ TWL LRG LVL3 (GOWN DISPOSABLE) ×2 IMPLANT
GOWN STRL REUS W/TWL LRG LVL3 (GOWN DISPOSABLE) ×4
HANDLE PNEUMATIC FOR CONSTEL (OPHTHALMIC) IMPLANT
IMPLANT SILICONE (Ophthalmic Related) ×2 IMPLANT
KIT BASIN OR (CUSTOM PROCEDURE TRAY) ×2 IMPLANT
KIT PERFLUORON PROCEDURE 5ML (MISCELLANEOUS) ×1 IMPLANT
KIT TURNOVER KIT B (KITS) ×2 IMPLANT
KNIFE CRESCENT 1.75 EDGEAHEAD (BLADE) IMPLANT
KNIFE GRIESHABER SHARP 2.5MM (MISCELLANEOUS) ×6 IMPLANT
MICROPICK 25G (MISCELLANEOUS)
NDL 18GX1X1/2 (RX/OR ONLY) (NEEDLE) ×1 IMPLANT
NDL 25GX 5/8IN NON SAFETY (NEEDLE) IMPLANT
NDL FILTER BLUNT 18X1 1/2 (NEEDLE) ×1 IMPLANT
NDL HYPO 30X.5 LL (NEEDLE) ×2 IMPLANT
NDL PRECISIONGLIDE 27X1.5 (NEEDLE) IMPLANT
NEEDLE 18GX1X1/2 (RX/OR ONLY) (NEEDLE) ×4 IMPLANT
NEEDLE 25GX 5/8IN NON SAFETY (NEEDLE) IMPLANT
NEEDLE FILTER BLUNT 18X 1/2SAF (NEEDLE) ×1
NEEDLE FILTER BLUNT 18X1 1/2 (NEEDLE) ×1 IMPLANT
NEEDLE HYPO 30X.5 LL (NEEDLE) ×4 IMPLANT
NEEDLE PRECISIONGLIDE 27X1.5 (NEEDLE) IMPLANT
NS IRRIG 1000ML POUR BTL (IV SOLUTION) ×2 IMPLANT
PACK VITRECTOMY CUSTOM (CUSTOM PROCEDURE TRAY) ×2 IMPLANT
PAD ARMBOARD 7.5X6 YLW CONV (MISCELLANEOUS) ×4 IMPLANT
PAK VITRECTOMY PIK 25 GA (OPHTHALMIC RELATED) IMPLANT
PIC ILLUMINATED 25G (OPHTHALMIC)
PICK MICROPICK 25G (MISCELLANEOUS) IMPLANT
PIK ILLUMINATED 25G (OPHTHALMIC) IMPLANT
PROBE LASER ILLUM FLEX CVD 25G (OPHTHALMIC) ×1 IMPLANT
REPL STRA BRUSH NDL (NEEDLE) IMPLANT
REPL STRA BRUSH NEEDLE (NEEDLE) IMPLANT
RESERVOIR BACK FLUSH (MISCELLANEOUS) IMPLANT
ROLLS DENTAL (MISCELLANEOUS) ×4 IMPLANT
SET FLUID INJECTOR (SET/KITS/TRAYS/PACK) IMPLANT
SLEEVE SCLERAL TYPE 270 (Ophthalmic Related) ×1 IMPLANT
SPEAR EYE SURG WECK-CEL (MISCELLANEOUS) ×6 IMPLANT
SPONGE SURGIFOAM ABS GEL 12-7 (HEMOSTASIS) ×1 IMPLANT
STOPCOCK 4 WAY LG BORE MALE ST (IV SETS) IMPLANT
STRIP CLOSURE SKIN 1/2X4 (GAUZE/BANDAGES/DRESSINGS) ×1 IMPLANT
SUT CHROMIC 7 0 TG140 8 (SUTURE) ×2 IMPLANT
SUT ETHILON 9 0 TG140 8 (SUTURE) IMPLANT
SUT MERSILENE 4 0 RV 2 (SUTURE) ×4 IMPLANT
SUT SILK 2 0 (SUTURE) ×2
SUT SILK 2-0 18XBRD TIE 12 (SUTURE) ×1 IMPLANT
SUT SILK 4 0 RB 1 (SUTURE) ×2 IMPLANT
SUT VICRYL 7 0 TG140 8 (SUTURE) IMPLANT
SYR 10ML LL (SYRINGE) ×2 IMPLANT
SYR 20CC LL (SYRINGE) ×2 IMPLANT
SYR 5ML LL (SYRINGE) ×2 IMPLANT
SYR BULB 3OZ (MISCELLANEOUS) ×2 IMPLANT
SYR TB 1ML LUER SLIP (SYRINGE) IMPLANT
TIRE RTNL 2.5XGRV CNCV 9X (Ophthalmic Related) IMPLANT
TOWEL OR 17X24 6PK STRL BLUE (TOWEL DISPOSABLE) ×6 IMPLANT
TUBING HIGH PRESS EXTEN 6IN (TUBING) ×1 IMPLANT
WATER STERILE IRR 1000ML POUR (IV SOLUTION) ×2 IMPLANT
WIPE INSTRUMENT VISIWIPE 73X73 (MISCELLANEOUS) ×2 IMPLANT

## 2018-03-06 NOTE — Brief Op Note (Signed)
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 C3F8 injection right eye   Surgeon:  Hayden Pedro, MD...  Assistant:  Deatra Ina SA  Anesthesia: General  Specimen: none  Estimated blood loss:  1cc  Complications: none  Patient sent to PACU in good condition  Composed by Hayden Pedro MD  Dictation number: [id number]

## 2018-03-06 NOTE — Anesthesia Preprocedure Evaluation (Signed)
Anesthesia Evaluation  Patient identified by MRN, date of birth, ID band Patient awake    Reviewed: Allergy & Precautions, H&P , NPO status , Patient's Chart, lab work & pertinent test results  Airway Mallampati: II   Neck ROM: full    Dental   Pulmonary former smoker,    breath sounds clear to auscultation       Cardiovascular hypertension,  Rhythm:regular Rate:Normal     Neuro/Psych  Neuromuscular disease    GI/Hepatic GERD  ,  Endo/Other    Renal/GU      Musculoskeletal   Abdominal   Peds  Hematology   Anesthesia Other Findings   Reproductive/Obstetrics                             Anesthesia Physical Anesthesia Plan  ASA: II  Anesthesia Plan: General   Post-op Pain Management:    Induction: Intravenous  PONV Risk Score and Plan: 3 and Ondansetron, Dexamethasone and Treatment may vary due to age or medical condition  Airway Management Planned: Oral ETT  Additional Equipment:   Intra-op Plan:   Post-operative Plan: Extubation in OR  Informed Consent: I have reviewed the patients History and Physical, chart, labs and discussed the procedure including the risks, benefits and alternatives for the proposed anesthesia with the patient or authorized representative who has indicated his/her understanding and acceptance.     Plan Discussed with: CRNA, Anesthesiologist and Surgeon  Anesthesia Plan Comments:         Anesthesia Quick Evaluation

## 2018-03-06 NOTE — Transfer of Care (Signed)
Immediate Anesthesia Transfer of Care Note  Patient: Anita Randolph  Procedure(s) Performed: SCLERAL BUCKLE WITH 25 GAUGE PARS PLANA VITRECTOMY PERFLUORON INJECTION AND REMOVAL (Right Eye) INSERTION OF GAS (Right Eye) MEMBRANE PEEL (Right Eye) LASER PHOTO ABLATION (Right Eye)  Patient Location: PACU  Anesthesia Type:General  Level of Consciousness: awake and alert   Airway & Oxygen Therapy: Patient Spontanous Breathing and Patient connected to nasal cannula oxygen  Post-op Assessment: Report given to RN and Post -op Vital signs reviewed and stable  Post vital signs: Reviewed and stable  Last Vitals:  Vitals Value Taken Time  BP 129/78 03/06/2018  6:18 PM  Temp 36.7 C 03/06/2018  6:15 PM  Pulse 89 03/06/2018  6:18 PM  Resp 13 03/06/2018  6:18 PM  SpO2 98 % 03/06/2018  6:18 PM    Last Pain:  Vitals:   03/06/18 1815  TempSrc:   PainSc: Asleep         Complications: No apparent anesthesia complications

## 2018-03-06 NOTE — Anesthesia Procedure Notes (Signed)
Procedure Name: Intubation Date/Time: 03/06/2018 1:52 PM Performed by: Eligha Bridegroom, CRNA Pre-anesthesia Checklist: Patient identified, Emergency Drugs available, Suction available, Patient being monitored and Timeout performed Patient Re-evaluated:Patient Re-evaluated prior to induction Oxygen Delivery Method: Circle system utilized Preoxygenation: Pre-oxygenation with 100% oxygen Induction Type: IV induction Ventilation: Mask ventilation without difficulty and Oral airway inserted - appropriate to patient size Laryngoscope Size: Mac and 3 Grade View: Grade II Tube type: Oral Tube size: 7.0 mm Number of attempts: 1 Airway Equipment and Method: Stylet Placement Confirmation: ETT inserted through vocal cords under direct vision,  positive ETCO2 and breath sounds checked- equal and bilateral Secured at: 22 cm Tube secured with: Tape Dental Injury: Teeth and Oropharynx as per pre-operative assessment

## 2018-03-06 NOTE — H&P (Signed)
I examined the patient today and there is no change in the medical status 

## 2018-03-06 NOTE — Anesthesia Postprocedure Evaluation (Signed)
Anesthesia Post Note  Patient: Anita Randolph  Procedure(s) Performed: SCLERAL BUCKLE WITH 25 GAUGE PARS PLANA VITRECTOMY PERFLUORON INJECTION AND REMOVAL (Right Eye) INSERTION OF GAS (Right Eye) MEMBRANE PEEL (Right Eye) LASER PHOTO ABLATION (Right Eye)     Patient location during evaluation: PACU Anesthesia Type: General Level of consciousness: sedated Pain management: pain level controlled Vital Signs Assessment: post-procedure vital signs reviewed and stable Respiratory status: spontaneous breathing and respiratory function stable Cardiovascular status: stable Postop Assessment: no apparent nausea or vomiting Anesthetic complications: no    Last Vitals:  Vitals:   03/06/18 1745 03/06/18 1747  BP:  (!) 125/55  Pulse: 79 77  Resp: 11 13  Temp:    SpO2: 99% 98%    Last Pain:  Vitals:   03/06/18 1745  TempSrc:   PainSc: Asleep                 Maciah Feeback DANIEL

## 2018-03-07 ENCOUNTER — Encounter (HOSPITAL_COMMUNITY): Payer: Self-pay | Admitting: Ophthalmology

## 2018-03-07 DIAGNOSIS — H33001 Unspecified retinal detachment with retinal break, right eye: Secondary | ICD-10-CM | POA: Diagnosis not present

## 2018-03-07 DIAGNOSIS — I10 Essential (primary) hypertension: Secondary | ICD-10-CM | POA: Diagnosis not present

## 2018-03-07 DIAGNOSIS — K219 Gastro-esophageal reflux disease without esophagitis: Secondary | ICD-10-CM | POA: Diagnosis not present

## 2018-03-07 DIAGNOSIS — Z923 Personal history of irradiation: Secondary | ICD-10-CM | POA: Diagnosis not present

## 2018-03-07 DIAGNOSIS — Z853 Personal history of malignant neoplasm of breast: Secondary | ICD-10-CM | POA: Diagnosis not present

## 2018-03-07 DIAGNOSIS — Z9221 Personal history of antineoplastic chemotherapy: Secondary | ICD-10-CM | POA: Diagnosis not present

## 2018-03-07 MED ORDER — GATIFLOXACIN 0.5 % OP SOLN: 1.0000 [drp] | Freq: Four times a day (QID) | OPHTHALMIC | Status: DC

## 2018-03-07 MED ORDER — PREDNISOLONE ACETATE 1 % OP SUSP: 1.0000 [drp] | Freq: Four times a day (QID) | OPHTHALMIC | 0 refills | Status: DC

## 2018-03-07 MED ORDER — BACITRACIN-POLYMYXIN B 500-10000 UNIT/GM OP OINT: 1.0000 "application " | TOPICAL_OINTMENT | Freq: Three times a day (TID) | OPHTHALMIC | 0 refills | Status: DC

## 2018-03-07 MED ORDER — BRIMONIDINE TARTRATE 0.2 % OP SOLN: 1.0000 [drp] | Freq: Two times a day (BID) | OPHTHALMIC | 12 refills | Status: DC

## 2018-03-07 NOTE — Progress Notes (Signed)
03/07/2018, 6:25 AM  Mental Status:  Awake, Alert, Oriented  Anterior segment: Cornea  Clear    Anterior Chamber Small hyphema/blood spot    Lens:   Cataract  Intra Ocular Pressure 14 mmHg with Tonopen  Vitreous: Clear 95%gas bubble   Retina:  Superior and inferiorly retina attached.  White retina with folding on the lower temporal buckle.   Impression: Retina attached with folding on the temporal buckle. View is hazy due to cataract  Final Diagnosis: Principal Problem:   Retinal detachment with multiple breaks Active Problems:   Rhegmatogenous retinal detachment of right eye   Plan: start post operative eye drops.  Position down during the day and to lie on left side to sleep.  Discharge to home.  Give post operative instructions  Anita Randolph 03/07/2018, 6:25 AM

## 2018-03-07 NOTE — Op Note (Signed)
NAME:  Anita Randolph, Anita Randolph                    ACCOUNT NO.:  MEDICAL RECORD NO.:  [id number]  LOCATION:                                 FACILITY:  PHYSICIAN:  Harlo Jaso D. Zigmund Daniel, M.D.      DATE OF BIRTH:  DATE OF PROCEDURE:  03/06/2018 DATE OF DISCHARGE:                              OPERATIVE REPORT   ADMISSION DIAGNOSIS:  Rhegmatogenous retinal detachment in the right eye.  PROCEDURES:  Scleral buckle, pars plana vitrectomy, Perfluoron infusion, Perfluoron removal, gas fluid exchange, peripheral membrane peel, and C3F8 injection.  SURGEON:  Chrystie Nose. Zigmund Daniel, M.D.  ASSISTANT:  Deatra Ina, SA.  ANESTHESIA:  General.  DETAILS:  Usual prep and drape, 360 degree limbal peritomy, isolation of 4 rectus muscles on 2-0 silk, scleral dissection for 360 degrees to admit a #279 intrascleral implant.  Diathermy placed in the bed.  A 279 buckle was placed around the eye with 2 mm trim from the posterior edge. A 240 band was placed around the eye with a 270 sleeve.  The joint of the buckle and the 270 sleeve were placed at 2 o'clock.  Two sutures per quadrant for a total of 8 scleral sutures were placed in the scleral flaps.  The temporal sutures were positioned with the knots posterior. The perforation site was chosen at 7 o'clock in the posterior aspect of the bed.  A large amount of clear colorless subretinal fluid came forth in a controlled manner.  Once the fluid stopped, the buckle element was placed against the drain.  The scleral flaps were drawn securely to cause a proper indentation of the globe.  The buckle was adjusted and trimmed.  The band was adjusted and trimmed.  The scleral sutures were drawn securely knotted and the free ends removed.  Indirect ophthalmoscopy showed the retina attached superiorly and inferiorly with some folding on the scleral buckle.  There was some fluid posterior. Decision was made for a vitrectomy at this point, 25-gauge trocars were placed at 8, 10, and  2 o'clock.  A Provisc was placed on the corneal surface and the BIOM viewing system was brought into place.  Pars plana vitrectomy was begun with removal of peeling of vitreous membranes. Once these were peeled, there was some posterior fluid.  Perfluoron 4.5 mL was injected into the vitreous cavity to reattach the inferior retina.  The retina was nicely reattached with this maneuver.  The Endo laser was positioned in the eye and 223 burns were placed on the scleral buckle and the power was 400 mW, 1000 microns each, and 0.1 seconds each.  The gas fluid exchange was begun at this point with removal of BSS over the PFO.  The peripheral retina was sandwiched, so that the retina was reattached anterior to the PFO and posterior to the PFO.  The view became hazy because of the lens turning cloudy.  The PFO was removed and a sterile room air was injected to replace the PFO.  All PFO was removed at this point.  C3F8 in a 14% concentration was prepared. This C3F8 was then exchanged for intravitreal gas.  Again, the view was somewhat dim at this  point, but all fluid was removed and C3F8 was exchanged for intravitreal gas.  The trocars were removed from the eye. The wounds were found to be secure.  The conjunctiva was reposited with 7-0 chromic suture.  Polymyxin and ceftazidime were injected around the globe.  Decadron 10 mg was injected around the globe.  Atropine solution was applied.  Marcaine was injected around the globe.  Polysporin ophthalmic ointment, a patch and shield were placed.  The closing pressure was 10 with [place].  COMPLICATIONS:  None.  DURATION:  3 hours.  The patient was awakened and taken to Recovery in satisfactory condition.     Chrystie Nose. Zigmund Daniel, M.D.     JDM/MEDQ  D:  03/06/2018  T:  03/07/2018  Job:  [id number]

## 2018-03-07 NOTE — Progress Notes (Signed)
Pt was discharged to go home.  Prescriptions and post operative instructions given by MD.  Reinforcement of  diischarge instructions given.

## 2018-03-07 NOTE — Discharge Summary (Signed)
Discharge summary not needed on [facility] patients per medical records. 

## 2018-03-09 ENCOUNTER — Encounter (INDEPENDENT_AMBULATORY_CARE_PROVIDER_SITE_OTHER): Payer: Medicare Other | Admitting: Ophthalmology

## 2018-03-09 DIAGNOSIS — H338 Other retinal detachments: Secondary | ICD-10-CM

## 2018-03-13 ENCOUNTER — Ambulatory Visit: Payer: Medicare Other | Admitting: Oncology

## 2018-03-13 ENCOUNTER — Encounter (INDEPENDENT_AMBULATORY_CARE_PROVIDER_SITE_OTHER): Payer: Medicare Other | Admitting: Ophthalmology

## 2018-03-13 ENCOUNTER — Other Ambulatory Visit: Payer: Self-pay

## 2018-03-13 DIAGNOSIS — H338 Other retinal detachments: Secondary | ICD-10-CM

## 2018-03-21 ENCOUNTER — Ambulatory Visit: Payer: Managed Care, Other (non HMO) | Admitting: Oncology

## 2018-03-21 ENCOUNTER — Other Ambulatory Visit: Payer: Managed Care, Other (non HMO)

## 2018-03-25 ENCOUNTER — Other Ambulatory Visit: Payer: Self-pay | Admitting: Hematology and Oncology

## 2018-03-26 ENCOUNTER — Encounter (INDEPENDENT_AMBULATORY_CARE_PROVIDER_SITE_OTHER): Payer: Medicare Other | Admitting: Ophthalmology

## 2018-03-26 DIAGNOSIS — H338 Other retinal detachments: Secondary | ICD-10-CM

## 2018-03-26 NOTE — Progress Notes (Signed)
Patient ID: Anita Randolph, female   DOB: [birth year], 67 y.o.   MRN: [id number] ID: PENI RUPARD   DOB: 08-26-1951  MR#: [id number]  [facility]U#:[id number]  PCP: [name] GYN: Carilyn Goodpasture SU: Gerald Stabs Streck], [name] OTHER MD: Glori Bickers, Amy Martinique, Rick Cornella, John Matthews  CHIEF COMPLAINT: Estrogen receptor positive breast cancer  CURRENT TREATMENT: Completing 5 years of tamoxifen   HISTORY OF PRESENT ILLNESS: From the original intake note:  Anita Randolph herself palpated a mass in her right breast 11/09/2012. She brought this to Dr. Julieta Bellini attention and mammography was obtained at [facility], (I do not have that report today) confirming a mass in the lower inner quadrant of the right breast. 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  Bilateral breast MRI 11/22/2012 showed a far posterior right breast mass measuring 3.2 cm with no discernible fat plane between the mass and the underlying pectoralis major. There was extension of irregular enhancement into the superficial aspect of the cholesterol is muscle underlying the mass, but there was no evidence for chest wall involvement, internal mammary artery chain or axillary lymph node involvement, or anything else in the breast or the contralateral breast.  The patient's subsequent history is as detailed below  INTERVAL HISTORY: Earl returns today for follow up and treatment of her estrogen receptor positive breast cancer. She continues on tamoxifen, with good tolerance. She is no longer having hot flashes, and increased vaginal wetness or dryness is not an issue.  At her last visit here our APP Mendel Ryder because he noted some changes in the right axilla.  The patient underwent diagnostic unilateral right breast mammography with CAD and tomography  and right ultrasonography on 07/07/2017 at [place] showing: breast density category B. There was no mammographic or sonographic evidence of malignancy.  On 03/06/2018 she underwent surgery for right retinal detachment. She reports that she had a macular hole, and she is unsure of the cause. She had surgery on 02/20/2018 to correct the issue, but after a week, the right retina had detatched. She is now 4 weeks out from her 2nd eye surgery, and she is being followed up by Dr. Zigmund Daniel. She currently has a gas bubble in the right eye, but this is improving. Due to this, she isn't able to fly on an airplane, so she was able to drive to [place]. She also has a cataract in the right eye.   She will complete 5 years of tamoxifen November of this year. There patient is ready to "graduate" today.   REVIEW OF SYSTEMS: Tierra reports that other than her recent right eye complications, she is doing great.  She says her husband retired in 2018. He lost some weight and started doing more outdoor exercise activities and volunteering. Anita Randolph expects for her eye sight to improve within the next 3 months. She denies unusual headaches, nausea, vomiting, or dizziness. There has been no unusual cough, phlegm production, or pleurisy. This been no change in bowel or bladder habits. She denies unexplained fatigue or unexplained weight loss, bleeding, rash, or fever. A detailed review of systems was otherwise stable.     PAST MEDICAL HISTORY: Past Medical History:  Diagnosis Date  . Anemia    s/p chemotherapy  . Breast cancer ([place]) 11/15/12   Right invasive ductal ca  . Cancer ([place])  breast  . 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 Fibroid 2007   largest 5x5x5cm  . GERD (gastroesophageal reflux disease)    only during chemotherapy  . Hypertension   . Macular hole of right eye   . Microscopic hematuria    negative w/u  . Neuromuscular disorder ([facility])    tingling hands and feet from  chemo-getting better  . Osteopenia due to cancer therapy   . Personal history of chemotherapy   . Personal history of radiation therapy   . Pneumonia   . Radiation 06/06/13-07/22/13   Right breast  . Rhegmatogenous retinal detachment of right eye     PAST SURGICAL HISTORY: Past Surgical History:  Procedure Laterality Date  . [place] VITRECTOMY WITH 20 GAUGE MVR PORT FOR MACULAR HOLE Right 02/20/2018  . 25 GAUGE PARS PLANA VITRECTOMY WITH 20 GAUGE MVR PORT FOR MACULAR HOLE Right 02/20/2018   Procedure: 25 GAUGE PARS PLANA VITRECTOMY WITH 20 GAUGE MVR PORT WITH SERUM PACTCH FOR MACULAR HOLE, LASER, C3F8 GAS INJECTION RIGHT EYE;  Surgeon: Hayden Pedro, MD;  Location: [facility];  Service: Ophthalmology;  Laterality: Right;  . ARM SKIN LESION BIOPSY / EXCISION  2011   nodular faciitis lt arm  . BREAST LUMPECTOMY    . BREAST LUMPECTOMY WITH NEEDLE LOCALIZATION AND AXILLARY SENTINEL LYMPH NODE BX Right 05/07/2013   Procedure: NEEDLE LOCALIZATION RIGHT BREAST LUMPECTOMY AND   SENTINEL LYMPH NODE  ;  Surgeon: Haywood Lasso, MD;  Location: [facility];  Service: General;  Laterality: Right;  . BREAST SURGERY    . COLONOSCOPY    . GAS INSERTION Right 03/06/2018   Procedure: INSERTION OF GAS;  Surgeon: Hayden Pedro, MD;  Location: [facility];  Service: Ophthalmology;  Laterality: Right;  . GAS/FLUID EXCHANGE Right 02/20/2018   Procedure: GAS/FLUID EXCHANGE;  Surgeon: Hayden Pedro, MD;  Location: [facility];  Service: Ophthalmology;  Laterality: Right;  . LASER PHOTO ABLATION Right 03/06/2018   Procedure: LASER PHOTO ABLATION;  Surgeon: Hayden Pedro, MD;  Location: [facility];  Service: Ophthalmology;  Laterality: Right;  . LIPOMA EXCISION     -right back(age 69)  . MEMBRANE PEEL Right 02/20/2018   Procedure: MEMBRANE PEEL;  Surgeon: Hayden Pedro, MD;  Location: [facility];  Service: Ophthalmology;  Laterality: Right;  . MEMBRANE PEEL Right 03/06/2018   Procedure: MEMBRANE PEEL;   Surgeon: Hayden Pedro, MD;  Location: [facility];  Service: Ophthalmology;  Laterality: Right;  . PORTACATH PLACEMENT  12/20/2012   Procedure: INSERTION PORT-A-CATH;  Surgeon: Haywood Lasso, MD;  Location: [facility];  Service: General;  Laterality: N/A;  . SCLERAL BUCKLE WITH POSSIBLE 25 GAUGE PARS PLANA VITRECTOMY Right 03/06/2018   Procedure: SCLERAL BUCKLE WITH 25 GAUGE PARS PLANA VITRECTOMY PERFLUORON INJECTION AND REMOVAL;  Surgeon: Hayden Pedro, MD;  Location: [facility];  Service: Ophthalmology;  Laterality: Right;  . TONSILLECTOMY      FAMILY HISTORY Family History  Problem Relation Age of Onset  . Breast cancer Mother 53  . Osteoporosis Mother   . Diabetes Mother   . Thyroid disease Mother   . Heart disease Father   . Bladder Cancer Father 40  . Breast cancer Maternal Aunt        diagnosed in her 48s  . Multiple sclerosis Paternal Aunt   . Heart disease Maternal Grandmother   . Diabetes Maternal Grandmother   . Mental retardation Cousin  maternal cousins; brothers, sons of aunt with breast cancer   the patient's father immigrated from [place] 1938, living in [place] for about 10 years. He had significant coronary artery disease, bladder cancer, and a ruptured aneurysm, but lived to be 30. The patient's mother is living at age 62. The patient has one brother, no sisters. The patient's mother had breast cancer diagnosed at age 43. The patient is of a Ashkenazi Jewish descent. She will be meeting with our genetics counselor today  GYNECOLOGIC HISTORY: Menarche age 31, first live birth age 7, she is [place] P2, menopause 2006, received hormone replacement until approximately 2010.  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 Her husband [name] had a management position for [employer].  He retired in 2017.Marland Kitchen Son [name] lives in [place] and is a sports Catering manager; he will marry Estill Bamberg) in [place] 2016. Daughter  [name] lives in [place], where she is a Sport and exercise psychologist in international developments and is also getting an MPH in Physicist, medical. The patient has no grandchildren.   ADVANCED DIRECTIVES: In place  HEALTH MAINTENANCE: Social History   Tobacco Use  . Smoking status: Former Smoker    Packs/day: 1.00    Years: 4.00    Pack years: 4.00    Types: Cigarettes    Last attempt to quit: 11/30/1974    Years since quitting: 43.3  . Smokeless tobacco: Never Used  Substance Use Topics  . Alcohol use: Yes    Alcohol/week: 1.0 oz    Types: 2 Standard drinks or equivalent per week    Comment: 1-2 glasses of wine per week  . Drug use: No   Marcele exercises 3-4 times a week, doing weights, walking, and other structures exercises.   Colonoscopy: 2009/ [facility]  PAP: 2013  Bone density: AUG 2016 shows think osteopenia , Improved   Allergies  Allergen Reactions  . Augmentin [Amoxicillin-Pot Clavulanate] Nausea And Vomiting    Upset stomach  . Sulfa Antibiotics Hives, Itching, Rash and Other (See Comments)    High fever    Current Outpatient Medications  Medication Sig Dispense Refill  . brimonidine (ALPHAGAN) 0.2 % ophthalmic solution Place 1 drop into the right eye 2 (two) times daily. 5 mL 12  . Calcium Carbonate-Vitamin D 600-400 MG-UNIT tablet Take 1 tablet by mouth 2 (two) times daily.     . cholecalciferol (VITAMIN D) 1000 units tablet Take 1 tablet (1,000 Units total) by mouth daily. 100 tablet 4  . prednisoLONE acetate (PRED FORTE) 1 % ophthalmic suspension Place 1 drop into the right eye 4 (four) times daily. 5 mL 0  . prednisoLONE acetate (PRED FORTE) 1 % ophthalmic suspension Place 1 drop into the right eye 4 (four) times daily. 5 mL 0  . PROLIA 60 MG/ML SOLN injection Inject 60 mg into the skin every 6 (six) months.  0  . simvastatin (ZOCOR) 20 MG tablet Take 20 mg by mouth daily.    . simvastatin (ZOCOR) 20 MG tablet Take 1 tablet (20 mg total) by mouth daily at 6 PM. 30  tablet 0  . tamoxifen (NOLVADEX) 20 MG tablet TAKE 1 TABLET BY MOUTH EVERY DAY 30 tablet 1   No current facility-administered medications for this visit.     OBJECTIVE: Middle-aged white woman who appears well  Vitals:   04/02/18 0835  BP: 122/82  Pulse: 78  Resp: 18  Temp: 98.5 F (36.9 C)  SpO2: 100%  Body mass index is 27.76 kg/m.    ECOG FS: 0 Filed Weights   04/02/18 0835  Weight: 161 lb 11.2 oz (73.3 kg)   Sclerae unicteric, EOMs intact Oropharynx clear and moist No cervical or supraclavicular adenopathy Lungs no rales or rhonchi Heart regular rate and rhythm Abd soft, nontender, positive bowel sounds MSK no focal spinal tenderness, no upper extremity lymphedema Neuro: nonfocal, well oriented, appropriate affect Breasts: The right breast is status post lumpectomy and radiation.  There is no evidence of local recurrence.  Left breast is benign.  Both axillae are benign.  LAB RESULTS: CBC    Component Value Date/Time   WBC 6.1 04/02/2018 0828   RBC 4.67 04/02/2018 0828   HGB 13.0 04/02/2018 0828   HGB 13.0 02/13/2017 1142   HCT 40.0 04/02/2018 0828   HCT 38.5 02/13/2017 1142   PLT 247 04/02/2018 0828   PLT 278 02/13/2017 1142   MCV 85.7 04/02/2018 0828   MCV 83.5 02/13/2017 1142   MCH 27.8 04/02/2018 0828   MCHC 32.5 04/02/2018 0828   RDW 14.5 04/02/2018 0828   RDW 14.9 (H) 02/13/2017 1142   LYMPHSABS 2.0 04/02/2018 0828   LYMPHSABS 2.1 02/13/2017 1142   MONOABS 0.4 04/02/2018 0828   MONOABS 0.5 02/13/2017 1142   EOSABS 0.2 04/02/2018 0828   EOSABS 0.1 02/13/2017 1142   BASOSABS 0.0 04/02/2018 0828   BASOSABS 0.1 02/13/2017 1142       Chemistry      Component Value Date/Time   NA 140 02/20/2018 0934   NA 140 02/13/2017 1142   K 3.5 02/20/2018 0934   K 3.9 02/13/2017 1142   CL 106 02/20/2018 0934   CL 109 (H) 05/02/2013 1043   CO2 21 (L) 02/20/2018 0934   CO2 23 02/13/2017 1142   BUN 13 02/20/2018 0934   BUN 20.5 02/13/2017 1142    CREATININE 1.00 02/20/2018 0934   CREATININE 1.1 02/13/2017 1142      Component Value Date/Time   CALCIUM 9.2 02/20/2018 0934   CALCIUM 9.2 02/13/2017 1142        STUDIES: Since her last visit, she underwent diagnostic unilateral right breast mammography with CAD and tomography and right ultrasonography on 07/07/2017 at [facility] showing: breast density category B. There was no mammographic or sonographic evidence of malignancy.  ASSESSMENT: 67 y.o.  woman   (1)  s/p right breast biopsy 11/15/2012 for a clinical T2 N0, stage IIA invasive ductal carcinoma, grade 3, with superficial involvement of the pectoralis muscle, estrogen receptor 29% positive, progesterone receptor negative, with an Mib-1 of 79% and HER-2 amplification by CISH with a ratio of 4.39  (2) treated in the neoadjuvant setting, completing 6 cycles of docetaxel/ carboplatin/ trastuzumab 04/11/2013  (3) trastuzumab continued for total of one year (through January 2015).-- Final echo 01/21/2014 showed a well-preserved ejecton fraction  (4) right lumpectomy and sentinel lymph node sampling 05/07/2013 showed a pathologic complete response  (5) adjuvant radiation completed 07/22/2013  (6) started tamoxifen 08/20/2013, discontinued December 2016 with poor tolerance  (7) osteopenia-- T score -1.9 on August 2014;   (a) denosumab/Prolia twice a year started April 2015, last dose 09/19/2016  (b) repeat bone density study 07/28/2015 shows an improved T score at -1.4  (c) repeat bone density under Dr. Orvan Seen obtained 01/10/2017 showed a T score of -1.4  (8) tamoxifen resumed February 2017, completing 5 years of antiestrogens November 2019  PLAN:  Mireya is now just about 5 years out from definitive  surgery for her breast cancer with no evidence of disease recurrence.  This is very favorable.  She is tolerating tamoxifen well.  The plan is to continue that for a total of 5 years which will take [place] to November  of this year.  We discussed the data comparing 10 years of tamoxifen with 5.  In some cases there is a slight further decrease in the risk of recurrence, and the 2% range.  I usually reserve that for patients who had positive lymph nodes or other high risk features.  In Amarissa's case I am comfortable with her discontinuing tamoxifen after 5 years and so she  Accordingly I releasing her to the care of her primary care physician.  All she will need in terms of breast cancer follow-up is yearly mammography and a yearly physician breast exam.  I will be glad to see Vicenta again at any point in the future if and when the need arises but as of now are making no further routine appointments for her here.   Bereket Gernert, Virgie Dad, MD  04/02/18 8:53 AM Medical Oncology and Hematology [facility] [address] [place], [place] [address] Tel. [phone]    Fax. [phone]  This document serves as a record of services personally performed by Lurline Del, MD. It was created on his behalf by Sheron Nightingale, a trained medical scribe. The creation of this record is based on the scribe's personal observations and the provider's statements to them.   I have reviewed the above documentation for accuracy and completeness, and I agree with the above.

## 2018-03-30 DIAGNOSIS — R799 Abnormal finding of blood chemistry, unspecified: Secondary | ICD-10-CM | POA: Diagnosis not present

## 2018-04-02 ENCOUNTER — Inpatient Hospital Stay: Payer: Medicare Other | Attending: Oncology

## 2018-04-02 ENCOUNTER — Inpatient Hospital Stay (HOSPITAL_BASED_OUTPATIENT_CLINIC_OR_DEPARTMENT_OTHER): Payer: Medicare Other | Admitting: Oncology

## 2018-04-02 VITALS — BP 122/82 | HR 78 | Temp 98.5°F | Resp 18 | Ht 64.0 in | Wt 161.7 lb

## 2018-04-02 DIAGNOSIS — C50511 Malignant neoplasm of lower-outer quadrant of right female breast: Secondary | ICD-10-CM | POA: Diagnosis not present

## 2018-04-02 DIAGNOSIS — M8580 Other specified disorders of bone density and structure, unspecified site: Secondary | ICD-10-CM | POA: Insufficient documentation

## 2018-04-02 DIAGNOSIS — Z170 Estrogen receptor positive status [ER+]: Secondary | ICD-10-CM

## 2018-04-02 LAB — COMPREHENSIVE METABOLIC PANEL
ALBUMIN: 3.6 g/dL (ref 3.5–5.0)
ALT: 12 U/L (ref 0–55)
ANION GAP: 7 (ref 3–11)
AST: 19 U/L (ref 5–34)
Alkaline Phosphatase: 57 U/L (ref 40–150)
BUN: 23 mg/dL (ref 7–26)
CO2: 27 mmol/L (ref 22–29)
Calcium: 9.6 mg/dL (ref 8.4–10.4)
Chloride: 106 mmol/L (ref 98–109)
Creatinine, Ser: 1.26 mg/dL — ABNORMAL HIGH (ref 0.60–1.10)
GFR calc non Af Amer: 44 mL/min — ABNORMAL LOW (ref >60)
GFR, EST AFRICAN AMERICAN: 51 mL/min — AB (ref >60)
GLUCOSE: 109 mg/dL (ref 70–140)
POTASSIUM: 4 mmol/L (ref 3.5–5.1)
SODIUM: 140 mmol/L (ref 136–145)
Total Bilirubin: 0.5 mg/dL (ref 0.2–1.2)
Total Protein: 6.9 g/dL (ref 6.4–8.3)

## 2018-04-02 LAB — CBC WITH DIFFERENTIAL/PLATELET
BASOS PCT: 1 %
Basophils Absolute: 0 10*3/uL (ref 0.0–0.1)
EOS PCT: 3 %
Eosinophils Absolute: 0.2 10*3/uL (ref 0.0–0.5)
HCT: 40 % (ref 34.8–46.6)
Hemoglobin: 13 g/dL (ref 11.6–15.9)
LYMPHS ABS: 2 10*3/uL (ref 0.9–3.3)
Lymphocytes Relative: 32 %
MCH: 27.8 pg (ref 25.1–34.0)
MCHC: 32.5 g/dL (ref 31.5–36.0)
MCV: 85.7 fL (ref 79.5–101.0)
MONO ABS: 0.4 10*3/uL (ref 0.1–0.9)
MONOS PCT: 6 %
Neutro Abs: 3.5 10*3/uL (ref 1.5–6.5)
Neutrophils Relative %: 58 %
Platelets: 247 10*3/uL (ref 145–400)
RBC: 4.67 MIL/uL (ref 3.70–5.45)
RDW: 14.5 % (ref 11.2–14.5)
WBC: 6.1 10*3/uL (ref 3.9–10.3)

## 2018-04-02 MED ORDER — TAMOXIFEN CITRATE 20 MG PO TABS: 20.0000 mg | ORAL_TABLET | Freq: Every day | ORAL | 2 refills | Status: DC

## 2018-04-03 ENCOUNTER — Telehealth: Payer: Self-pay | Admitting: Oncology

## 2018-04-03 NOTE — Telephone Encounter (Signed)
Per 5/6 no los °

## 2018-04-09 ENCOUNTER — Other Ambulatory Visit: Payer: Self-pay | Admitting: Obstetrics and Gynecology

## 2018-04-09 ENCOUNTER — Encounter (INDEPENDENT_AMBULATORY_CARE_PROVIDER_SITE_OTHER): Payer: Medicare Other | Admitting: Ophthalmology

## 2018-04-09 DIAGNOSIS — H338 Other retinal detachments: Secondary | ICD-10-CM

## 2018-04-09 DIAGNOSIS — Z853 Personal history of malignant neoplasm of breast: Secondary | ICD-10-CM

## 2018-04-12 ENCOUNTER — Ambulatory Visit
Admission: RE | Admit: 2018-04-12 | Discharge: 2018-04-12 | Disposition: A | Discharge status: Home / Self Care | Payer: Medicare Other | Source: Ambulatory Visit | Attending: Obstetrics and Gynecology | Admitting: Obstetrics and Gynecology

## 2018-04-12 DIAGNOSIS — M8588 Other specified disorders of bone density and structure, other site: Secondary | ICD-10-CM | POA: Diagnosis not present

## 2018-04-12 DIAGNOSIS — R928 Other abnormal and inconclusive findings on diagnostic imaging of breast: Secondary | ICD-10-CM | POA: Diagnosis not present

## 2018-04-12 DIAGNOSIS — Z853 Personal history of malignant neoplasm of breast: Secondary | ICD-10-CM

## 2018-05-01 ENCOUNTER — Encounter (INDEPENDENT_AMBULATORY_CARE_PROVIDER_SITE_OTHER): Payer: Medicare Other | Admitting: Ophthalmology

## 2018-05-01 DIAGNOSIS — H338 Other retinal detachments: Secondary | ICD-10-CM

## 2018-05-08 ENCOUNTER — Encounter (INDEPENDENT_AMBULATORY_CARE_PROVIDER_SITE_OTHER): Payer: Medicare Other | Admitting: Ophthalmology

## 2018-05-08 DIAGNOSIS — H338 Other retinal detachments: Secondary | ICD-10-CM

## 2018-05-28 ENCOUNTER — Encounter (INDEPENDENT_AMBULATORY_CARE_PROVIDER_SITE_OTHER): Payer: Medicare Other | Admitting: Ophthalmology

## 2018-05-28 DIAGNOSIS — H338 Other retinal detachments: Secondary | ICD-10-CM

## 2018-08-06 ENCOUNTER — Encounter (INDEPENDENT_AMBULATORY_CARE_PROVIDER_SITE_OTHER): Payer: Medicare Other | Admitting: Ophthalmology

## 2018-08-08 ENCOUNTER — Encounter (INDEPENDENT_AMBULATORY_CARE_PROVIDER_SITE_OTHER): Payer: Medicare Other | Admitting: Ophthalmology

## 2018-08-08 DIAGNOSIS — H43812 Vitreous degeneration, left eye: Secondary | ICD-10-CM

## 2018-08-08 DIAGNOSIS — H35341 Macular cyst, hole, or pseudohole, right eye: Secondary | ICD-10-CM | POA: Diagnosis not present

## 2018-08-08 DIAGNOSIS — H338 Other retinal detachments: Secondary | ICD-10-CM

## 2018-08-08 DIAGNOSIS — H43813 Vitreous degeneration, bilateral: Secondary | ICD-10-CM

## 2018-08-23 ENCOUNTER — Ambulatory Visit (INDEPENDENT_AMBULATORY_CARE_PROVIDER_SITE_OTHER): Payer: Medicare Other | Admitting: Licensed Clinical Social Worker

## 2018-08-23 DIAGNOSIS — F419 Anxiety disorder, unspecified: Secondary | ICD-10-CM | POA: Diagnosis not present

## 2018-08-30 DIAGNOSIS — Z23 Encounter for immunization: Secondary | ICD-10-CM | POA: Diagnosis not present

## 2018-09-07 ENCOUNTER — Other Ambulatory Visit: Payer: Self-pay | Admitting: Oncology

## 2018-09-10 ENCOUNTER — Ambulatory Visit: Payer: Medicare Other | Admitting: Licensed Clinical Social Worker

## 2018-09-19 ENCOUNTER — Ambulatory Visit (INDEPENDENT_AMBULATORY_CARE_PROVIDER_SITE_OTHER): Payer: Medicare Other | Admitting: Licensed Clinical Social Worker

## 2018-09-19 DIAGNOSIS — F419 Anxiety disorder, unspecified: Secondary | ICD-10-CM

## 2018-09-20 DIAGNOSIS — H43822 Vitreomacular adhesion, left eye: Secondary | ICD-10-CM | POA: Diagnosis not present

## 2018-09-20 DIAGNOSIS — H2512 Age-related nuclear cataract, left eye: Secondary | ICD-10-CM | POA: Diagnosis not present

## 2018-09-20 DIAGNOSIS — H35341 Macular cyst, hole, or pseudohole, right eye: Secondary | ICD-10-CM | POA: Diagnosis not present

## 2018-09-20 DIAGNOSIS — H33051 Total retinal detachment, right eye: Secondary | ICD-10-CM | POA: Diagnosis not present

## 2018-09-20 DIAGNOSIS — H25811 Combined forms of age-related cataract, right eye: Secondary | ICD-10-CM | POA: Diagnosis not present

## 2018-09-24 ENCOUNTER — Ambulatory Visit (INDEPENDENT_AMBULATORY_CARE_PROVIDER_SITE_OTHER): Payer: Medicare Other | Admitting: Licensed Clinical Social Worker

## 2018-09-24 DIAGNOSIS — F419 Anxiety disorder, unspecified: Secondary | ICD-10-CM | POA: Diagnosis not present

## 2018-10-03 ENCOUNTER — Ambulatory Visit: Payer: Medicare Other | Admitting: Licensed Clinical Social Worker

## 2018-10-15 DIAGNOSIS — H25811 Combined forms of age-related cataract, right eye: Secondary | ICD-10-CM | POA: Diagnosis not present

## 2018-10-15 DIAGNOSIS — H2511 Age-related nuclear cataract, right eye: Secondary | ICD-10-CM | POA: Diagnosis not present

## 2018-10-19 ENCOUNTER — Ambulatory Visit (INDEPENDENT_AMBULATORY_CARE_PROVIDER_SITE_OTHER): Payer: Medicare Other | Admitting: Licensed Clinical Social Worker

## 2018-10-19 DIAGNOSIS — F419 Anxiety disorder, unspecified: Secondary | ICD-10-CM

## 2018-10-30 ENCOUNTER — Ambulatory Visit (INDEPENDENT_AMBULATORY_CARE_PROVIDER_SITE_OTHER): Payer: Medicare Other | Admitting: Licensed Clinical Social Worker

## 2018-10-30 DIAGNOSIS — F419 Anxiety disorder, unspecified: Secondary | ICD-10-CM

## 2018-11-04 DIAGNOSIS — J0100 Acute maxillary sinusitis, unspecified: Secondary | ICD-10-CM | POA: Diagnosis not present

## 2018-11-13 ENCOUNTER — Ambulatory Visit (INDEPENDENT_AMBULATORY_CARE_PROVIDER_SITE_OTHER): Payer: Medicare Other | Admitting: Licensed Clinical Social Worker

## 2018-11-13 DIAGNOSIS — F419 Anxiety disorder, unspecified: Secondary | ICD-10-CM

## 2018-12-11 DIAGNOSIS — E559 Vitamin D deficiency, unspecified: Secondary | ICD-10-CM | POA: Diagnosis not present

## 2018-12-11 DIAGNOSIS — Z79899 Other long term (current) drug therapy: Secondary | ICD-10-CM | POA: Diagnosis not present

## 2018-12-11 DIAGNOSIS — E7800 Pure hypercholesterolemia, unspecified: Secondary | ICD-10-CM | POA: Diagnosis not present

## 2018-12-13 DIAGNOSIS — H33029 Retinal detachment with multiple breaks, unspecified eye: Secondary | ICD-10-CM | POA: Diagnosis not present

## 2018-12-13 DIAGNOSIS — Z0000 Encounter for general adult medical examination without abnormal findings: Secondary | ICD-10-CM | POA: Diagnosis not present

## 2018-12-13 DIAGNOSIS — Z853 Personal history of malignant neoplasm of breast: Secondary | ICD-10-CM | POA: Diagnosis not present

## 2018-12-13 DIAGNOSIS — E7800 Pure hypercholesterolemia, unspecified: Secondary | ICD-10-CM | POA: Diagnosis not present

## 2018-12-13 DIAGNOSIS — H35341 Macular cyst, hole, or pseudohole, right eye: Secondary | ICD-10-CM | POA: Diagnosis not present

## 2018-12-13 DIAGNOSIS — R252 Cramp and spasm: Secondary | ICD-10-CM | POA: Diagnosis not present

## 2018-12-18 ENCOUNTER — Ambulatory Visit (INDEPENDENT_AMBULATORY_CARE_PROVIDER_SITE_OTHER): Payer: Medicare Other | Admitting: Licensed Clinical Social Worker

## 2018-12-18 DIAGNOSIS — F419 Anxiety disorder, unspecified: Secondary | ICD-10-CM

## 2019-01-01 DIAGNOSIS — H26491 Other secondary cataract, right eye: Secondary | ICD-10-CM | POA: Diagnosis not present

## 2019-01-24 DIAGNOSIS — M8588 Other specified disorders of bone density and structure, other site: Secondary | ICD-10-CM | POA: Diagnosis not present

## 2019-01-24 DIAGNOSIS — N958 Other specified menopausal and perimenopausal disorders: Secondary | ICD-10-CM | POA: Diagnosis not present

## 2019-02-06 ENCOUNTER — Encounter (INDEPENDENT_AMBULATORY_CARE_PROVIDER_SITE_OTHER): Payer: Medicare Other | Admitting: Ophthalmology

## 2019-02-06 ENCOUNTER — Other Ambulatory Visit: Payer: Self-pay

## 2019-02-06 DIAGNOSIS — H43812 Vitreous degeneration, left eye: Secondary | ICD-10-CM

## 2019-02-06 DIAGNOSIS — H35341 Macular cyst, hole, or pseudohole, right eye: Secondary | ICD-10-CM | POA: Diagnosis not present

## 2019-02-06 DIAGNOSIS — H338 Other retinal detachments: Secondary | ICD-10-CM | POA: Diagnosis not present

## 2019-02-06 DIAGNOSIS — D3131 Benign neoplasm of right choroid: Secondary | ICD-10-CM

## 2019-02-06 DIAGNOSIS — H2512 Age-related nuclear cataract, left eye: Secondary | ICD-10-CM | POA: Diagnosis not present

## 2019-02-19 ENCOUNTER — Ambulatory Visit: Payer: Medicare Other | Admitting: Licensed Clinical Social Worker

## 2019-05-20 ENCOUNTER — Other Ambulatory Visit: Payer: Self-pay

## 2019-05-20 ENCOUNTER — Ambulatory Visit (INDEPENDENT_AMBULATORY_CARE_PROVIDER_SITE_OTHER): Payer: Medicare Other | Admitting: Podiatry

## 2019-05-20 ENCOUNTER — Encounter: Payer: Self-pay | Admitting: Podiatry

## 2019-05-20 VITALS — BP 141/86 | HR 89 | Temp 97.7°F | Resp 16

## 2019-05-20 DIAGNOSIS — L03031 Cellulitis of right toe: Secondary | ICD-10-CM | POA: Diagnosis not present

## 2019-05-20 DIAGNOSIS — L600 Ingrowing nail: Secondary | ICD-10-CM | POA: Diagnosis not present

## 2019-05-20 NOTE — Progress Notes (Signed)
   Subjective:    Patient ID: Anita Randolph, female    DOB: 25-Dec-1950, 68 y.o.   MRN: [id number]  HPI    Review of Systems  All other systems reviewed and are negative.      Objective:   Physical Exam        Assessment & Plan:

## 2019-05-20 NOTE — Patient Instructions (Signed)

## 2019-05-21 NOTE — Progress Notes (Signed)
Subjective:   Patient ID: Anita Randolph, female   DOB: 68 y.o.   MRN: [id number]   HPI Patient presents stating she is had a painful ingrown toenail that she is tried to work on herself and states that it has been recent and she never had it before.  Patient does not smoke and likes to be active   Review of Systems  All other systems reviewed and are negative.       Objective:  Physical Exam Vitals signs and nursing note reviewed.  Constitutional:      Appearance: She is well-developed.  Pulmonary:     Effort: Pulmonary effort is normal.  Musculoskeletal: Normal range of motion.  Skin:    General: Skin is warm.  Neurological:     Mental Status: She is alert.     Neurovascular status intact muscle strength was found to be adequate range of motion within normal limits.  Patient is found to have on the right hallux lateral border there is some redness and distal drainage with pain of the nail corner and moderate structural changes to the nailbed itself    Assessment:  Paronychia infection of the right hallux with distal drainage irritation present     Plan:  H&P condition reviewed and recommended correction of the nail corner.  I do think we can avoid permanent procedure and at this time I infiltrated the right hallux 60 mg like a Marcaine mixture sterile prep applied to the toe and using sterile instrumentation I remove the lateral border I removed proud flesh abscess and necrotic tissue and flushed the wound and applied sterile dressing.  Gave instructions for soaks and patient will be seen back and may require permanent treatment at one point in future

## 2019-05-23 ENCOUNTER — Encounter: Payer: Self-pay | Admitting: Podiatry

## 2019-06-20 ENCOUNTER — Ambulatory Visit (INDEPENDENT_AMBULATORY_CARE_PROVIDER_SITE_OTHER): Payer: Medicare Other | Admitting: Licensed Clinical Social Worker

## 2019-06-20 DIAGNOSIS — F419 Anxiety disorder, unspecified: Secondary | ICD-10-CM

## 2019-06-24 ENCOUNTER — Other Ambulatory Visit: Payer: Self-pay | Admitting: Obstetrics and Gynecology

## 2019-06-24 DIAGNOSIS — Z1231 Encounter for screening mammogram for malignant neoplasm of breast: Secondary | ICD-10-CM

## 2019-06-26 DIAGNOSIS — Z124 Encounter for screening for malignant neoplasm of cervix: Secondary | ICD-10-CM | POA: Diagnosis not present

## 2019-06-26 DIAGNOSIS — Z6829 Body mass index (BMI) 29.0-29.9, adult: Secondary | ICD-10-CM | POA: Diagnosis not present

## 2019-06-26 DIAGNOSIS — E559 Vitamin D deficiency, unspecified: Secondary | ICD-10-CM | POA: Diagnosis not present

## 2019-06-26 DIAGNOSIS — M8580 Other specified disorders of bone density and structure, unspecified site: Secondary | ICD-10-CM | POA: Diagnosis not present

## 2019-06-28 ENCOUNTER — Other Ambulatory Visit: Payer: Self-pay

## 2019-06-28 ENCOUNTER — Ambulatory Visit
Admission: RE | Admit: 2019-06-28 | Discharge: 2019-06-28 | Disposition: A | Discharge status: Home / Self Care | Payer: Medicare Other | Source: Ambulatory Visit | Attending: Obstetrics and Gynecology | Admitting: Obstetrics and Gynecology

## 2019-06-28 DIAGNOSIS — Z1231 Encounter for screening mammogram for malignant neoplasm of breast: Secondary | ICD-10-CM | POA: Diagnosis not present

## 2019-07-16 ENCOUNTER — Ambulatory Visit (INDEPENDENT_AMBULATORY_CARE_PROVIDER_SITE_OTHER): Payer: Medicare Other | Admitting: Licensed Clinical Social Worker

## 2019-07-16 DIAGNOSIS — F419 Anxiety disorder, unspecified: Secondary | ICD-10-CM

## 2019-08-09 ENCOUNTER — Encounter (INDEPENDENT_AMBULATORY_CARE_PROVIDER_SITE_OTHER): Payer: Medicare Other | Admitting: Ophthalmology

## 2019-08-13 ENCOUNTER — Encounter (INDEPENDENT_AMBULATORY_CARE_PROVIDER_SITE_OTHER): Payer: Medicare Other | Admitting: Ophthalmology

## 2019-08-14 DIAGNOSIS — Z23 Encounter for immunization: Secondary | ICD-10-CM | POA: Diagnosis not present

## 2019-08-20 ENCOUNTER — Other Ambulatory Visit: Payer: Self-pay

## 2019-08-20 ENCOUNTER — Encounter (INDEPENDENT_AMBULATORY_CARE_PROVIDER_SITE_OTHER): Payer: Medicare Other | Admitting: Ophthalmology

## 2019-08-20 DIAGNOSIS — H26491 Other secondary cataract, right eye: Secondary | ICD-10-CM

## 2019-08-20 DIAGNOSIS — H43822 Vitreomacular adhesion, left eye: Secondary | ICD-10-CM | POA: Diagnosis not present

## 2019-08-20 DIAGNOSIS — H2512 Age-related nuclear cataract, left eye: Secondary | ICD-10-CM

## 2019-08-20 DIAGNOSIS — H338 Other retinal detachments: Secondary | ICD-10-CM | POA: Diagnosis not present

## 2019-08-20 DIAGNOSIS — H43812 Vitreous degeneration, left eye: Secondary | ICD-10-CM

## 2019-08-20 DIAGNOSIS — D3131 Benign neoplasm of right choroid: Secondary | ICD-10-CM

## 2019-08-20 DIAGNOSIS — H35341 Macular cyst, hole, or pseudohole, right eye: Secondary | ICD-10-CM | POA: Diagnosis not present

## 2019-09-05 DIAGNOSIS — Z20828 Contact with and (suspected) exposure to other viral communicable diseases: Secondary | ICD-10-CM | POA: Diagnosis not present

## 2019-09-22 ENCOUNTER — Other Ambulatory Visit: Payer: Self-pay | Admitting: Oncology

## 2019-10-02 DIAGNOSIS — Z20828 Contact with and (suspected) exposure to other viral communicable diseases: Secondary | ICD-10-CM | POA: Diagnosis not present

## 2019-10-15 DIAGNOSIS — Z20828 Contact with and (suspected) exposure to other viral communicable diseases: Secondary | ICD-10-CM | POA: Diagnosis not present

## 2019-11-04 DIAGNOSIS — R0609 Other forms of dyspnea: Secondary | ICD-10-CM | POA: Diagnosis not present

## 2019-11-07 ENCOUNTER — Encounter: Payer: Self-pay | Admitting: Cardiology

## 2019-11-07 DIAGNOSIS — R0602 Shortness of breath: Secondary | ICD-10-CM | POA: Insufficient documentation

## 2019-11-07 NOTE — Progress Notes (Signed)
Virtual Visit via Video Note   This visit type was conducted due to national recommendations for restrictions regarding the COVID-19 Pandemic (e.g. social distancing) in an effort to limit this patient's exposure and mitigate transmission in our community.  Due to her co-morbid illnesses, this patient is at least at moderate risk for complications without adequate follow up.  This format is felt to be most appropriate for this patient at this time.  All issues noted in this document were discussed and addressed.  A limited physical exam was performed with this format.  Please refer to the patient's chart for her consent to telehealth for [facility].   Date:  11/08/2019   ID:  Anita Randolph, DOB 1951-08-02, MRN [id number]  Patient Location: Home Provider Location: Home  PCP:  [name], MD  Cardiologist:  Minus Breeding, MD  Electrophysiologist:  None   Evaluation Performed:  New Patient Evaluation  Chief Complaint:  SOB  History of Present Illness:    Anita Randolph is a 68 y.o. female who is referred by [name], MD for evaluation of SOB.  He has been treated with Herceptin for breast cancer.  She has been having increasing shortness of breath.  She has had breast cancer to include treatment that included Herceptin.  She had echocardiograms to follow up the therapy.  The last echo in 2015 demonstrated an EF of 55 - 60%.  She never had any cardiomyopathy related to this.  She tolerated her chemotherapy and finished it.  She reports that she has been getting slowly more short of breath.  She tries to walk.  It used to be that she can get her heart rate up to 126 and not be particularly short of breath but now it is.  She recovers fairly quickly when she stops what she is doing.  She is not describing resting shortness of breath, PND or orthopnea.  She is not been having any chest pressure, neck or arm discomfort.  She does not really notice any palpitations, presyncope or  syncope.  Her weights been slowly up but she is not having any swelling or edema.  She tries to be good about salt.  The patient does not have symptoms concerning for COVID-19 infection (fever, chills, cough, or new shortness of breath).    Past Medical History:  Diagnosis Date  . Anemia    s/p chemotherapy  . Breast cancer ([place]) 11/15/12   Right invasive ductal ca  . 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 Fibroid 2007   largest 5x5x5cm  . GERD (gastroesophageal reflux disease)    only during chemotherapy  . Hypertension   . Macular hole of right eye   . Microscopic hematuria    negative w/u  . Neuromuscular disorder (HCC)    tingling hands and feet from chemo-getting better  . Osteopenia due to cancer therapy   . Personal history of chemotherapy   . Personal history of radiation therapy   . Pneumonia   . Radiation 06/06/13-07/22/13   Right breast  . Rhegmatogenous retinal detachment of right eye    Past Surgical History:  Procedure Laterality Date  . [place] VITRECTOMY WITH 20 GAUGE MVR PORT FOR MACULAR HOLE Right 02/20/2018  . 25 GAUGE PARS PLANA VITRECTOMY WITH 20 GAUGE MVR PORT FOR MACULAR HOLE Right 02/20/2018   Procedure: 25 GAUGE PARS PLANA VITRECTOMY WITH 20 GAUGE MVR PORT WITH SERUM PACTCH FOR MACULAR HOLE, LASER, C3F8 GAS  INJECTION RIGHT EYE;  Surgeon: Hayden Pedro, MD;  Location: [place];  Service: Ophthalmology;  Laterality: Right;  . ARM SKIN LESION BIOPSY / EXCISION  2011   nodular faciitis lt arm  . BREAST LUMPECTOMY    . BREAST LUMPECTOMY WITH NEEDLE LOCALIZATION AND AXILLARY SENTINEL LYMPH NODE BX Right 05/07/2013   Procedure: NEEDLE LOCALIZATION RIGHT BREAST LUMPECTOMY AND   SENTINEL LYMPH NODE  ;  Surgeon: Haywood Lasso, MD;  Location: [place];  Service: General;  Laterality: Right;  . BREAST SURGERY    . COLONOSCOPY    . GAS INSERTION Right 03/06/2018   Procedure: INSERTION OF GAS;  Surgeon: Hayden Pedro, MD;  Location: [place];  Service: Ophthalmology;  Laterality: Right;  . GAS/FLUID EXCHANGE Right 02/20/2018   Procedure: GAS/FLUID EXCHANGE;  Surgeon: Hayden Pedro, MD;  Location: [place];  Service: Ophthalmology;  Laterality: Right;  . LASER PHOTO ABLATION Right 03/06/2018   Procedure: LASER PHOTO ABLATION;  Surgeon: Hayden Pedro, MD;  Location: [place];  Service: Ophthalmology;  Laterality: Right;  . LIPOMA EXCISION     -right back(age 87)  . MEMBRANE PEEL Right 02/20/2018   Procedure: MEMBRANE PEEL;  Surgeon: Hayden Pedro, MD;  Location: [place];  Service: Ophthalmology;  Laterality: Right;  . MEMBRANE PEEL Right 03/06/2018   Procedure: MEMBRANE PEEL;  Surgeon: Hayden Pedro, MD;  Location: [place];  Service: Ophthalmology;  Laterality: Right;  . PORTACATH PLACEMENT  12/20/2012   Procedure: INSERTION PORT-A-CATH;  Surgeon: Haywood Lasso, MD;  Location: [place];  Service: General;  Laterality: N/A;  . SCLERAL BUCKLE WITH POSSIBLE 25 GAUGE PARS PLANA VITRECTOMY Right 03/06/2018   Procedure: SCLERAL BUCKLE WITH 25 GAUGE PARS PLANA VITRECTOMY PERFLUORON INJECTION AND REMOVAL;  Surgeon: Hayden Pedro, MD;  Location: [place];  Service: Ophthalmology;  Laterality: Right;  . TONSILLECTOMY       Current Meds  Medication Sig  . Calcium Carbonate-Vitamin D 600-400 MG-UNIT tablet Take 1 tablet by mouth 2 (two) times daily.   . CVS D3 25 MCG (1000 UT) capsule TAKE 1 CAPSULE BY MOUTH EVERY DAY  . simvastatin (ZOCOR) 20 MG tablet Take 1 tablet (20 mg total) by mouth daily at 6 PM.     Allergies:   Augmentin [amoxicillin-pot clavulanate] and Sulfa antibiotics   Social History   Tobacco Use  . Smoking status: Former Smoker    Packs/day: 1.00    Years: 4.00    Pack years: 4.00    Types: Cigarettes    Quit date: 11/30/1974    Years since quitting: 44.9  . Smokeless tobacco: Never Used  Substance Use Topics  . Alcohol use: Yes    Alcohol/week: 2.0 standard drinks    Types: 2 Standard  drinks or equivalent per week    Comment: 1-2 glasses of wine per week  . Drug use: No     Family Hx: The patient's family history includes Bladder Cancer (age of onset: 69) in her father; Breast cancer in her maternal aunt; Breast cancer (age of onset: 64) in her mother; Diabetes in her maternal grandmother and mother; Heart disease in her maternal grandmother; Heart disease (age of onset: 20) in her father; Mental retardation in her cousin; Multiple sclerosis in her paternal aunt; Osteoporosis in her mother; Thyroid disease in her mother.  ROS:   Please see the history of present illness.    None All other systems reviewed and are negative.  Prior CV studies:   The following studies were reviewed today:    Labs/Other Tests and Data Reviewed:    EKG:  An ECG dated 02/20/18 was personally reviewed today and demonstrated:  NSR, rate 86, axis within normal limits, intervals within normal limits, no acute ST-T wave changes.  Recent Labs: No results found for requested labs within last 8760 hours.   Recent Lipid Panel No results found for: CHOL, TRIG, HDL, CHOLHDL, LDLCALC, LDLDIRECT  Wt Readings from Last 3 Encounters:  04/02/18 161 lb 11.2 oz (73.3 kg)  03/06/18 158 lb (71.7 kg)  02/20/18 158 lb (71.7 kg)     Objective:    Vital Signs:  BP 130/87   Pulse 76   LMP 11/28/2004    GEN:  no acute distress EYES:  sclerae anicteric, EOMI - Extraocular Movements Intact NEURO:  alert and oriented x 3, no obvious focal deficit PSYCH:  normal affect  ASSESSMENT & PLAN:    SOB: Her shortness of breath is probably multifactorial.  It may be weight and deconditioning.  I am going to have her get a BNP level with her primary provider.  Because of her family history I like to bring her back and screen her with a POET (Plain Old Exercise Treadmill) sometime next month.  I would consider coronary calcium scoring as well because of family history.  If these come back normal then we would  concentrate on weight loss and exercise.  WEIGHT: She wants consultation with a nutritionist and I will plan this to discuss the Mediterranean diet.  COVID-19 Education: The signs and symptoms of COVID-19 were discussed with the patient and how to seek care for testing (follow up with PCP or arrange E-visit).  We talked about the vaccine The importance of social distancing was discussed today.  Time:   Today, I have spent 35 minutes with the patient with telehealth technology discussing the above problems.     Medication Adjustments/Labs and Tests Ordered: Current medicines are reviewed at length with the patient today.  Concerns regarding medicines are outlined above.   Tests Ordered: No orders of the defined types were placed in this encounter.   Medication Changes: No orders of the defined types were placed in this encounter.   Follow Up:  In Person in February  Signed, Minus Breeding, MD  11/08/2019 10:29 AM    [place]

## 2019-11-08 ENCOUNTER — Telehealth (INDEPENDENT_AMBULATORY_CARE_PROVIDER_SITE_OTHER): Payer: Medicare Other | Admitting: Cardiology

## 2019-11-08 ENCOUNTER — Encounter: Payer: Self-pay | Admitting: Cardiology

## 2019-11-08 ENCOUNTER — Telehealth: Payer: Self-pay

## 2019-11-08 ENCOUNTER — Other Ambulatory Visit: Payer: Self-pay

## 2019-11-08 VITALS — BP 130/87 | HR 76

## 2019-11-08 DIAGNOSIS — R0602 Shortness of breath: Secondary | ICD-10-CM

## 2019-11-08 DIAGNOSIS — Z713 Dietary counseling and surveillance: Secondary | ICD-10-CM

## 2019-11-08 NOTE — Patient Instructions (Addendum)
Medication Instructions:  Your physician recommends that you continue on your current medications as directed. Please refer to the Current Medication list given to you today.  *If you need a refill on your cardiac medications before your next appointment, please call your pharmacy*  Lab Work: none If you have labs (blood work) drawn today and your tests are completely normal, you will receive your results only by: Marland Kitchen MyChart Message (if you have MyChart) OR . A paper copy in the mail If you have any lab test that is abnormal or we need to change your treatment, we will call you to review the results.  Testing/Procedures: Your physician has requested that you have an exercise tolerance test. For further information please visit [email or web address].  DUE AT THE END OF January 2021.  YOU WILL BE CONTACTED BY A SCHEDULER TO SET UP THIS APPOINTMENT.  YOU WILL NEED A COVID-19 TEST SCHEDULED 3-4 DAYS PRIOR TO YOUR EXERCISE TOLERANCE TEST. YOU WILL BE CONTACTED TO SET UP YOUR COVID-19 TEST APPOINTMENT AS WELL.  Please also follow instruction sheet, as given.   You are scheduled for an Exercise Stress Test on ______ at _______ (TO BE DETERMINED)  Please arrive 15 minutes prior to your appointment time for registration and insurance purposes.  The test will take approximately 45 minutes to complete.  How to prepare for your Exercise Stress Test: . Do bring a list of your current medications with you.  If not listed below, you may take your medications as normal. . Do wear comfortable clothes (no dresses or overalls) and walking shoes, tennis shoes preferred (no heels or open toed shoes are allowed) . Do Not wear cologne, perfume, aftershave or lotions (deodorant is allowed). . Please report to [address], [address] for your test.  If these instructions are not followed, your test will have to be rescheduled.  If you have questions or concerns about your appointment, you can call  the Stress Lab at [phone].  If you cannot keep your appointment, please provide 24 hours notification to the Stress Lab, to avoid a possible $50 charge to your account.  Follow-Up: At [facility], you and your health needs are our priority.  As part of our continuing mission to provide you with exceptional heart care, we have created designated Provider Care Teams.  These Care Teams include your primary Cardiologist (physician) and Advanced Practice Providers (APPs -  Physician Assistants and Nurse Practitioners) who all work together to provide you with the care you need, when you need it.  Your next appointment:   2 month(s) February 2021  The format for your next appointment:   In Person  Provider:   Minus Breeding, MD  Other Instructions REFERRAL TO NUTRITIONIST/DIETICIAN  nutrition consult for mediterraean (spelling) die

## 2019-11-08 NOTE — Telephone Encounter (Signed)
Discussed Dr. Lolly Mustache orders for 12/11 with pt.  Pt aware 12/11 AVS able to be viewed on mychart

## 2019-11-10 DIAGNOSIS — E782 Mixed hyperlipidemia: Secondary | ICD-10-CM | POA: Insufficient documentation

## 2019-11-10 DIAGNOSIS — Z860101 Personal history of adenomatous and serrated colon polyps: Secondary | ICD-10-CM | POA: Insufficient documentation

## 2019-11-10 DIAGNOSIS — Z86010 Personal history of colonic polyps: Secondary | ICD-10-CM | POA: Insufficient documentation

## 2019-11-11 DIAGNOSIS — Z853 Personal history of malignant neoplasm of breast: Secondary | ICD-10-CM | POA: Diagnosis not present

## 2019-11-11 DIAGNOSIS — K635 Polyp of colon: Secondary | ICD-10-CM | POA: Diagnosis not present

## 2019-11-11 DIAGNOSIS — Z86010 Personal history of colonic polyps: Secondary | ICD-10-CM | POA: Diagnosis not present

## 2019-11-11 DIAGNOSIS — Z1211 Encounter for screening for malignant neoplasm of colon: Secondary | ICD-10-CM | POA: Diagnosis not present

## 2019-11-12 DIAGNOSIS — H0015 Chalazion left lower eyelid: Secondary | ICD-10-CM | POA: Diagnosis not present

## 2019-11-27 ENCOUNTER — Encounter: Payer: Self-pay | Admitting: Cardiology

## 2019-12-09 ENCOUNTER — Other Ambulatory Visit (HOSPITAL_COMMUNITY)
Admission: RE | Admit: 2019-12-09 | Discharge: 2019-12-09 | Disposition: A | Discharge status: Home / Self Care | Payer: Medicare Other | Source: Ambulatory Visit | Attending: Cardiology | Admitting: Cardiology

## 2019-12-09 DIAGNOSIS — Z20822 Contact with and (suspected) exposure to covid-19: Secondary | ICD-10-CM | POA: Diagnosis not present

## 2019-12-09 DIAGNOSIS — Z01812 Encounter for preprocedural laboratory examination: Secondary | ICD-10-CM | POA: Insufficient documentation

## 2019-12-10 ENCOUNTER — Telehealth (HOSPITAL_COMMUNITY): Payer: Self-pay

## 2019-12-10 LAB — NOVEL CORONAVIRUS, NAA (HOSP ORDER, SEND-OUT TO REF LAB; TAT 18-24 HRS): SARS-CoV-2, NAA: NOT DETECTED

## 2019-12-10 NOTE — Telephone Encounter (Signed)
Encounter complete. 

## 2019-12-12 ENCOUNTER — Ambulatory Visit (HOSPITAL_COMMUNITY)
Admission: RE | Admit: 2019-12-12 | Discharge: 2019-12-12 | Disposition: A | Discharge status: Home / Self Care | Payer: Medicare Other | Source: Ambulatory Visit | Attending: Cardiology | Admitting: Cardiology

## 2019-12-12 ENCOUNTER — Other Ambulatory Visit: Payer: Self-pay

## 2019-12-12 DIAGNOSIS — R0602 Shortness of breath: Secondary | ICD-10-CM | POA: Insufficient documentation

## 2019-12-12 LAB — EXERCISE TOLERANCE TEST
Estimated workload: 7.9 METS
Exercise duration (min): 6 min
Exercise duration (sec): 36 s
MPHR: 153 {beats}/min
Peak HR: 169 {beats}/min
Percent HR: 110 %
RPE: 17
Rest HR: 93 {beats}/min

## 2019-12-16 NOTE — Progress Notes (Signed)
Cardiology Office Note   Date:  12/17/2019   ID:  Anita Randolph, DOB [birth year], MRN [id number]  PCP:  [name], MD  Cardiologist:   Minus Breeding, MD   Chief Complaint  Patient presents with  . Shortness of Breath      History of Present Illness: Anita Randolph is a 69 y.o. female who was referred by [name], MD for evaluation of SOB.  She has been treated with Herceptin for breast cancer. She had echocardiograms to follow up the therapy.  The last echo in 2015 demonstrated an EF of 55 - 60%.    Because of the SOB I sent her for a POET (Plain Old Exercise Treadmill).  This demonstrated no evidence of ischemia.    She came back today to discuss this.  She again just says that she get more short of breath walking up an incline and has to shorten her walk.  This is been slowly progressive and she thinks is out of proportion to her age or level of fitness.  She has not gained a lot of weight.  She does bring blood work today and I know that her BNP is normal.    Past Medical History:  Diagnosis Date  . Anemia    s/p chemotherapy  . Breast cancer ([place]) 11/15/12   Right invasive ductal ca  . 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 Fibroid 2007   largest 5x5x5cm  . GERD (gastroesophageal reflux disease)    only during chemotherapy  . Hypertension   . Macular hole of right eye   . Microscopic hematuria    negative w/u  . Neuromuscular disorder (HCC)    tingling hands and feet from chemo-getting better  . Osteopenia due to cancer therapy   . Personal history of chemotherapy   . Personal history of radiation therapy   . Pneumonia   . Radiation 06/06/13-07/22/13   Right breast  . Rhegmatogenous retinal detachment of right eye     Past Surgical History:  Procedure Laterality Date  . [place] VITRECTOMY WITH 20 GAUGE MVR PORT FOR MACULAR HOLE Right 02/20/2018  . 25 GAUGE PARS PLANA VITRECTOMY WITH 20 GAUGE MVR PORT FOR MACULAR  HOLE Right 02/20/2018   Procedure: 25 GAUGE PARS PLANA VITRECTOMY WITH 20 GAUGE MVR PORT WITH SERUM PACTCH FOR MACULAR HOLE, LASER, C3F8 GAS INJECTION RIGHT EYE;  Surgeon: Hayden Pedro, MD;  Location: [place];  Service: Ophthalmology;  Laterality: Right;  . ARM SKIN LESION BIOPSY / EXCISION  2011   nodular faciitis lt arm  . BREAST LUMPECTOMY    . BREAST LUMPECTOMY WITH NEEDLE LOCALIZATION AND AXILLARY SENTINEL LYMPH NODE BX Right 05/07/2013   Procedure: NEEDLE LOCALIZATION RIGHT BREAST LUMPECTOMY AND   SENTINEL LYMPH NODE  ;  Surgeon: Haywood Lasso, MD;  Location: [place];  Service: General;  Laterality: Right;  . BREAST SURGERY    . COLONOSCOPY    . GAS INSERTION Right 03/06/2018   Procedure: INSERTION OF GAS;  Surgeon: Hayden Pedro, MD;  Location: [place];  Service: Ophthalmology;  Laterality: Right;  . GAS/FLUID EXCHANGE Right 02/20/2018   Procedure: GAS/FLUID EXCHANGE;  Surgeon: Hayden Pedro, MD;  Location: [place];  Service: Ophthalmology;  Laterality: Right;  . LASER PHOTO ABLATION Right 03/06/2018   Procedure: LASER PHOTO ABLATION;  Surgeon: Hayden Pedro, MD;  Location: [place];  Service: Ophthalmology;  Laterality: Right;  .  LIPOMA EXCISION     -right back(age 25)  . MEMBRANE PEEL Right 02/20/2018   Procedure: MEMBRANE PEEL;  Surgeon: Hayden Pedro, MD;  Location: [place];  Service: Ophthalmology;  Laterality: Right;  . MEMBRANE PEEL Right 03/06/2018   Procedure: MEMBRANE PEEL;  Surgeon: Hayden Pedro, MD;  Location: [place];  Service: Ophthalmology;  Laterality: Right;  . PORTACATH PLACEMENT  12/20/2012   Procedure: INSERTION PORT-A-CATH;  Surgeon: Haywood Lasso, MD;  Location: [place];  Service: General;  Laterality: N/A;  . SCLERAL BUCKLE WITH POSSIBLE 25 GAUGE PARS PLANA VITRECTOMY Right 03/06/2018   Procedure: SCLERAL BUCKLE WITH 25 GAUGE PARS PLANA VITRECTOMY PERFLUORON INJECTION AND REMOVAL;  Surgeon: Hayden Pedro, MD;  Location: [place];  Service:  Ophthalmology;  Laterality: Right;  . TONSILLECTOMY       Current Outpatient Medications  Medication Sig Dispense Refill  . Calcium Carbonate-Vitamin D 600-400 MG-UNIT tablet Take 1 tablet by mouth 2 (two) times daily.     . CVS D3 25 MCG (1000 UT) capsule TAKE 1 CAPSULE BY MOUTH EVERY DAY 90 capsule 6  . simvastatin (ZOCOR) 20 MG tablet Take 1 tablet (20 mg total) by mouth daily at 6 PM. 30 tablet 0   No current facility-administered medications for this visit.    Allergies:   Augmentin [amoxicillin-pot clavulanate] and Sulfa antibiotics    ROS:  Please see the history of present illness.   Otherwise, review of systems are positive for none.   All other systems are reviewed and negative.    PHYSICAL EXAM: VS:  BP 140/89   Pulse 85   Temp (!) 96.8 F (36 C)   Ht 5\' 3"  (1.6 m)   Wt 171 lb (77.6 kg)   LMP 11/28/2004   SpO2 98%   BMI 30.29 kg/m  , BMI Body mass index is 30.29 kg/m. GENERAL:  Well appearing NECK:  No jugular venous distention, waveform within normal limits, carotid upstroke brisk and symmetric, no bruits, no thyromegaly LUNGS:  Clear to auscultation bilaterally CHEST:  Unremarkable HEART:  PMI not displaced or sustained,S1 and S2 within normal limits, no S3, no S4, no clicks, no rubs, no murmurs ABD:  Flat, positive bowel sounds normal in frequency in pitch, no bruits, no rebound, no guarding, no midline pulsatile mass, no hepatomegaly, no splenomegaly EXT:  2 plus pulses throughout, no edema, no cyanosis no clubbing   EKG:  EKG is ordered today. The ekg ordered today demonstrates sinus rhythm, rate 85, axis within normal limits, intervals within normal limits, no acute ST-T wave changes.   Recent Labs: No results found for requested labs within last 8760 hours.    Lipid Panel No results found for: CHOL, TRIG, HDL, CHOLHDL, VLDL, LDLCALC, LDLDIRECT    Wt Readings from Last 3 Encounters:  12/17/19 171 lb (77.6 kg)  04/02/18 161 lb 11.2 oz (73.3 kg)    03/06/18 158 lb (71.7 kg)      Other studies Reviewed: Additional studies/ records that were reviewed today include: Office records. Review of the above records demonstrates:  Please see elsewhere in the note.     ASSESSMENT AND PLAN:  SOB:  At this point with a normal BNP, negative treadmill test I think it is unlikely that there is a cardiac etiology.  Her exam is unremarkable.  I do not suspect structural heart disease including valvular abnormalities or heart failure.  I am going to order a coronary calcium score given the strong family history.  However, have asked her to go back to [name], MD to discuss other possible etiologies other than cardiac.  Perhaps she will need pulmonary evaluation.    Current medicines are reviewed at length with the patient today.  The patient does not have concerns regarding medicines.  The following changes have been made:  no change  Labs/ tests ordered today include:   Orders Placed This Encounter  Procedures  . CT CARDIAC SCORING  . EKG 12-Lead     Disposition:   FU with me as needed.     Signed, Minus Breeding, MD  12/17/2019 2:19 PM    [facility]

## 2019-12-17 ENCOUNTER — Other Ambulatory Visit: Payer: Self-pay

## 2019-12-17 ENCOUNTER — Encounter: Payer: Self-pay | Admitting: Cardiology

## 2019-12-17 ENCOUNTER — Ambulatory Visit (INDEPENDENT_AMBULATORY_CARE_PROVIDER_SITE_OTHER): Payer: Medicare Other | Admitting: Cardiology

## 2019-12-17 VITALS — BP 140/89 | HR 85 | Temp 96.8°F | Ht 63.0 in | Wt 171.0 lb

## 2019-12-17 DIAGNOSIS — R0602 Shortness of breath: Secondary | ICD-10-CM | POA: Diagnosis not present

## 2019-12-17 NOTE — Patient Instructions (Signed)
Medication Instructions:  No changes *If you need a refill on your cardiac medications before your next appointment, please call your pharmacy*  Lab Work: None  Testing/Procedures: Coronary Calcium Scoring at [place]: At [facility], you and your health needs are our priority.  As part of our continuing mission to provide you with exceptional heart care, we have created designated Provider Care Teams.  These Care Teams include your primary Cardiologist (physician) and Advanced Practice Providers (APPs -  Physician Assistants and Nurse Practitioners) who all work together to provide you with the care you need, when you need it.  Other Instructions Follow up as needed

## 2019-12-18 ENCOUNTER — Telehealth: Payer: Self-pay | Admitting: Cardiology

## 2019-12-18 NOTE — Telephone Encounter (Signed)
New Message  Pt is calling back for results   Please call  

## 2019-12-18 NOTE — Telephone Encounter (Signed)
Left message to call back  

## 2019-12-18 NOTE — Telephone Encounter (Signed)
-----   Message from Minus Breeding, MD sent at 12/16/2019  7:26 AM EST ----- POET (Plain Old Exercise Treadmill) was negative and I would like to get a coronary calcium score.  Call Ms. Neidhardt with the results and send results to [name], MD

## 2019-12-19 NOTE — Telephone Encounter (Signed)
Spoke with patient and she was actually seen in office this week and had calcium score scheduled

## 2019-12-29 ENCOUNTER — Ambulatory Visit: Payer: Medicare Other

## 2020-01-03 ENCOUNTER — Ambulatory Visit (INDEPENDENT_AMBULATORY_CARE_PROVIDER_SITE_OTHER)
Admission: RE | Admit: 2020-01-03 | Discharge: 2020-01-03 | Disposition: A | Discharge status: Home / Self Care | Payer: Self-pay | Source: Ambulatory Visit | Attending: Cardiology | Admitting: Cardiology

## 2020-01-03 ENCOUNTER — Other Ambulatory Visit: Payer: Self-pay

## 2020-01-03 DIAGNOSIS — R0602 Shortness of breath: Secondary | ICD-10-CM

## 2020-01-04 ENCOUNTER — Ambulatory Visit: Payer: Medicare Other

## 2020-01-09 ENCOUNTER — Ambulatory Visit: Payer: Medicare Other

## 2020-01-17 ENCOUNTER — Other Ambulatory Visit: Payer: Self-pay

## 2020-01-17 MED ORDER — SIMVASTATIN 40 MG PO TABS: 40.0000 mg | ORAL_TABLET | Freq: Every day | ORAL | 3 refills | Status: DC

## 2020-01-17 NOTE — Progress Notes (Signed)
Spoke with patient. Dr. Percival Spanish recommended increasing Zocor to 40mg  daily or changing to Lipitor 20mg . Patient chose to increase Zocor to 40mg  daily. Patient will follow up with PCP for future lipid recheck and recommendations. Prescription sent to preferred pharmacy.

## 2020-01-17 NOTE — Addendum Note (Signed)
Addended by: Sherrie Mustache on: 01/17/2020 01:35 PM   Modules accepted: Orders

## 2020-01-22 DIAGNOSIS — D485 Neoplasm of uncertain behavior of skin: Secondary | ICD-10-CM | POA: Diagnosis not present

## 2020-01-22 DIAGNOSIS — D2271 Melanocytic nevi of right lower limb, including hip: Secondary | ICD-10-CM | POA: Diagnosis not present

## 2020-01-22 DIAGNOSIS — D2262 Melanocytic nevi of left upper limb, including shoulder: Secondary | ICD-10-CM | POA: Diagnosis not present

## 2020-01-22 DIAGNOSIS — D2272 Melanocytic nevi of left lower limb, including hip: Secondary | ICD-10-CM | POA: Diagnosis not present

## 2020-01-22 DIAGNOSIS — L821 Other seborrheic keratosis: Secondary | ICD-10-CM | POA: Diagnosis not present

## 2020-01-22 DIAGNOSIS — D2261 Melanocytic nevi of right upper limb, including shoulder: Secondary | ICD-10-CM | POA: Diagnosis not present

## 2020-01-28 DIAGNOSIS — Z79899 Other long term (current) drug therapy: Secondary | ICD-10-CM | POA: Diagnosis not present

## 2020-01-28 DIAGNOSIS — R7303 Prediabetes: Secondary | ICD-10-CM | POA: Diagnosis not present

## 2020-01-28 DIAGNOSIS — E7800 Pure hypercholesterolemia, unspecified: Secondary | ICD-10-CM | POA: Diagnosis not present

## 2020-01-28 DIAGNOSIS — E559 Vitamin D deficiency, unspecified: Secondary | ICD-10-CM | POA: Diagnosis not present

## 2020-01-29 DIAGNOSIS — Z6829 Body mass index (BMI) 29.0-29.9, adult: Secondary | ICD-10-CM | POA: Diagnosis not present

## 2020-01-29 DIAGNOSIS — E7800 Pure hypercholesterolemia, unspecified: Secondary | ICD-10-CM | POA: Diagnosis not present

## 2020-01-29 DIAGNOSIS — R7303 Prediabetes: Secondary | ICD-10-CM | POA: Diagnosis not present

## 2020-01-30 ENCOUNTER — Ambulatory Visit (INDEPENDENT_AMBULATORY_CARE_PROVIDER_SITE_OTHER): Payer: Medicare Other | Admitting: Licensed Clinical Social Worker

## 2020-01-30 DIAGNOSIS — Z79899 Other long term (current) drug therapy: Secondary | ICD-10-CM | POA: Diagnosis not present

## 2020-01-30 DIAGNOSIS — Z0000 Encounter for general adult medical examination without abnormal findings: Secondary | ICD-10-CM | POA: Diagnosis not present

## 2020-01-30 DIAGNOSIS — F419 Anxiety disorder, unspecified: Secondary | ICD-10-CM | POA: Diagnosis not present

## 2020-01-30 DIAGNOSIS — I10 Essential (primary) hypertension: Secondary | ICD-10-CM | POA: Diagnosis not present

## 2020-01-30 DIAGNOSIS — E559 Vitamin D deficiency, unspecified: Secondary | ICD-10-CM | POA: Diagnosis not present

## 2020-01-30 DIAGNOSIS — E7800 Pure hypercholesterolemia, unspecified: Secondary | ICD-10-CM | POA: Diagnosis not present

## 2020-01-30 DIAGNOSIS — R7303 Prediabetes: Secondary | ICD-10-CM | POA: Diagnosis not present

## 2020-02-10 ENCOUNTER — Ambulatory Visit (INDEPENDENT_AMBULATORY_CARE_PROVIDER_SITE_OTHER): Payer: Medicare Other | Admitting: Podiatrist

## 2020-02-10 ENCOUNTER — Other Ambulatory Visit: Payer: Self-pay

## 2020-02-10 ENCOUNTER — Encounter: Payer: Self-pay | Admitting: Podiatrist

## 2020-02-10 VITALS — Temp 97.3°F

## 2020-02-10 DIAGNOSIS — L600 Ingrowing nail: Secondary | ICD-10-CM

## 2020-02-11 ENCOUNTER — Encounter: Payer: Self-pay | Admitting: Podiatrist

## 2020-02-11 NOTE — Progress Notes (Signed)
Chief Complaint  Patient presents with  . Nail Problem    Right foot; Hallux-lateral side; pt stated, "Wants nail checked; feels tender to the touch; soaking in epsom salt-helping"; x1 week     HPI: Patient is 69 y.o. female who presents today for pain right hallux lateral side.  She relates she has been using Epsom salts for the past week and has been helping.  Denies any active drainage at this time.   Allergies  Allergen Reactions  . Augmentin [Amoxicillin-Pot Clavulanate] Nausea And Vomiting    Upset stomach  . Sulfa Antibiotics Hives, Itching, Rash and Other (See Comments)    High fever    Review of systems is reviewed and negative.   Physical Exam  Patient is awake, alert, and oriented x 3.  In no acute distress.    Vascular status is intact with palpable pedal pulses DP and PT bilateral and capillary refill time less than 3 seconds bilateral.  No edema or erythema noted.  Neurological exam reveals epicritic and protective sensation grossly intact bilateral.  Dermatological exam reveals skin is supple and dry to bilateral feet.  No open lesions present.   Musculoskeletal exam: Musculature intact with dorsiflexion, plantarflexion, inversion, eversion. Ankle and First MPJ joint range of motion normal.   Right hallux lateral nail border is mildly tender to touch.  No redness, no swelling, no drainage, no active sign of infection present.  Assessment: Minimal ingrowing right hallux nail lateral nail border  Plan: Discussed treatment options and alternatives.  At this time patient was given an option for a permanent phenol matrixectomy and she would like to hold off and try conservative treatment to see if soaks will remedy the nail problem.  Patient will call if further treatment is required.  Soaking instructions in Epsom salt were dispensed for the patient.

## 2020-02-11 NOTE — Patient Instructions (Signed)
Epsom salt soaks as discussed with patient

## 2020-02-17 ENCOUNTER — Other Ambulatory Visit: Payer: Self-pay

## 2020-02-17 ENCOUNTER — Encounter (INDEPENDENT_AMBULATORY_CARE_PROVIDER_SITE_OTHER): Payer: Medicare Other | Admitting: Ophthalmology

## 2020-02-17 DIAGNOSIS — H26491 Other secondary cataract, right eye: Secondary | ICD-10-CM

## 2020-02-17 DIAGNOSIS — H338 Other retinal detachments: Secondary | ICD-10-CM | POA: Diagnosis not present

## 2020-02-17 DIAGNOSIS — H35341 Macular cyst, hole, or pseudohole, right eye: Secondary | ICD-10-CM

## 2020-02-17 DIAGNOSIS — D3131 Benign neoplasm of right choroid: Secondary | ICD-10-CM

## 2020-02-17 DIAGNOSIS — H2512 Age-related nuclear cataract, left eye: Secondary | ICD-10-CM | POA: Diagnosis not present

## 2020-02-17 DIAGNOSIS — H43813 Vitreous degeneration, bilateral: Secondary | ICD-10-CM | POA: Diagnosis not present

## 2020-02-19 ENCOUNTER — Other Ambulatory Visit: Payer: Self-pay

## 2020-02-19 ENCOUNTER — Encounter (INDEPENDENT_AMBULATORY_CARE_PROVIDER_SITE_OTHER): Payer: Self-pay | Admitting: Bariatrics

## 2020-02-19 ENCOUNTER — Ambulatory Visit (INDEPENDENT_AMBULATORY_CARE_PROVIDER_SITE_OTHER): Payer: Medicare Other | Admitting: Bariatrics

## 2020-02-19 VITALS — BP 133/71 | HR 93 | Temp 98.2°F | Ht 63.0 in | Wt 168.0 lb

## 2020-02-19 DIAGNOSIS — Z1331 Encounter for screening for depression: Secondary | ICD-10-CM

## 2020-02-19 DIAGNOSIS — R0602 Shortness of breath: Secondary | ICD-10-CM

## 2020-02-19 DIAGNOSIS — E669 Obesity, unspecified: Secondary | ICD-10-CM | POA: Insufficient documentation

## 2020-02-19 DIAGNOSIS — R7303 Prediabetes: Secondary | ICD-10-CM

## 2020-02-19 DIAGNOSIS — E538 Deficiency of other specified B group vitamins: Secondary | ICD-10-CM | POA: Diagnosis not present

## 2020-02-19 DIAGNOSIS — R5383 Other fatigue: Secondary | ICD-10-CM | POA: Diagnosis not present

## 2020-02-19 DIAGNOSIS — E7849 Other hyperlipidemia: Secondary | ICD-10-CM

## 2020-02-19 DIAGNOSIS — E6609 Other obesity due to excess calories: Secondary | ICD-10-CM | POA: Diagnosis not present

## 2020-02-19 DIAGNOSIS — Z6830 Body mass index (BMI) 30.0-30.9, adult: Secondary | ICD-10-CM | POA: Diagnosis not present

## 2020-02-19 DIAGNOSIS — Z0289 Encounter for other administrative examinations: Secondary | ICD-10-CM

## 2020-02-19 NOTE — Progress Notes (Signed)
Dear Dr. [name],   Thank you for referring Anita Randolph to our clinic. The following note includes my evaluation and treatment recommendations.  Chief Complaint:   OBESITY Anita Randolph (MR# [id number]) is a 69 y.o. female who presents for evaluation and treatment of obesity and related comorbidities. Current BMI is Body mass index is 29.76 kg/m.Anita Randolph has been struggling with her weight for many years and has been unsuccessful in either losing weight, maintaining weight loss, or reaching her healthy weight goal.  Anita Randolph is currently in the action stage of change and ready to dedicate time achieving and maintaining a healthier weight. Anita Randolph is interested in becoming our patient and working on intensive lifestyle modifications including (but not limited to) diet and exercise for weight loss.  Anita Randolph likes to cook, but not everyday. She states that she craves sweets - primarily chocolate.  Anita Randolph's habits were reviewed today and are as follows: Her family eats meals together, her desired weight loss is 23 lbs, she has been heavy most of her life, she started gaining weight after having babies a long time ago; added 10 lbs over the past 10 years, her heaviest weight ever was 168 pounds, she craves sweets - primarily chocolate, she frequently eats larger portions than normal and she struggles with emotional eating.  Depression Screen Anita Randolph's Food and Mood (modified PHQ-9) score was 4.  Depression screen [facility] 2/9 02/19/2020  Decreased Interest 1  Down, Depressed, Hopeless 1  PHQ - 2 Score 2  Altered sleeping 0  Tired, decreased energy 1  Change in appetite 1  Feeling bad or failure about yourself  0  Trouble concentrating 0  Moving slowly or fidgety/restless 0  Suicidal thoughts 0  PHQ-9 Score 4  Some recent data might be hidden   Subjective:   Other fatigue. [name] denies daytime somnolence and denies waking up still tired. Anita Randolph generally gets 7-8 hours of sleep per night,  and states that she has generally restful sleep. Snoring is present. Apneic episodes are not present. Epworth Sleepiness Score is 4.  Shortness of breath on exertion. Anita Randolph notes increasing shortness of breath with certain activities and seems to be worsening over time with weight gain. She notes getting out of breath sooner with activity than she used to. This has gotten worse recently. Anita Randolph denies shortness of breath at rest or orthopnea.  Other hyperlipidemia. [name] is taking Zocor. Cholesterol 213, LDL 127, HDL 58.  No results found for: CHOL, HDL, LDLCALC, LDLDIRECT, TRIG, CHOLHDL Lab Results  Component Value Date   ALT 12 04/02/2018   AST 19 04/02/2018   ALKPHOS 57 04/02/2018   BILITOT 0.5 04/02/2018   The ASCVD Risk score [name], et al., 2013) failed to calculate for the following reasons:   Cannot find a previous HDL lab   Cannot find a previous total cholesterol lab  Prediabetes. Anita Randolph has a diagnosis of prediabetes based on her elevated HgA1c and was informed this puts her at greater risk of developing diabetes. She continues to work on diet and exercise to decrease her risk of diabetes. She denies nausea or hypoglycemia. She endorses polyphagia.  No results found for: HGBA1C No results found for: INSULIN  B12 nutritional deficiency. Anita Randolph is not on supplementation.  Depression screening. [name] had a negative depression screen with a PHQ-9 score of 4.  Assessment/Plan:   Other fatigue. [name] does feel that her weight is causing her energy to be lower than it should be. Fatigue  may be related to obesity, depression or many other causes. Labs will be ordered, and in the meanwhile, Anita Randolph will focus on self care including making healthy food choices, increasing physical activity and focusing on stress reduction.  EKG 12-Lead performed.  Shortness of breath on exertion. Anita Randolph does feel that she gets out of breath more easily that she used to when she exercises. Anita Randolph's  shortness of breath appears to be obesity related and exercise induced. She has agreed to work on weight loss and gradually increase exercise to treat her exercise induced shortness of breath. Will continue to monitor closely.  Other hyperlipidemia. Cardiovascular risk and specific lipid/LDL goals reviewed.  We discussed several lifestyle modifications today and Anita Randolph will continue to work on diet, exercise and weight loss efforts. Orders and follow up as documented in patient record. Anita Randolph will continue taking Zocor as directed.  Counseling Intensive lifestyle modifications are the first line treatment for this issue. . Dietary changes: Increase soluble fiber. Decrease simple carbohydrates. . Exercise changes: Moderate to vigorous-intensity aerobic activity 150 minutes per week if tolerated.  . Lipid-lowering medications: see documented in medical record.  Prediabetes. Anita Randolph will continue to work on weight loss, exercise, increasing healthy fats and protein, and decreasing simple carbohydrates to help decrease the risk of diabetes. Insulin, random, level ordered.  B12 nutritional deficiency. The diagnosis was reviewed with the patient. Counseling provided today, see below. We will continue to monitor. Orders and follow up as documented in patient record. Vitamin B12, Folate levels ordered.  Counseling . The body needs vitamin B12: to make red blood cells; to make DNA; and to help the nerves work properly so they can carry messages from the brain to the body.  . The main causes of vitamin B12 deficiency include dietary deficiency, digestive diseases, pernicious anemia, and having a surgery in which part of the stomach or small intestine is removed.  . Certain medicines can make it harder for the body to absorb vitamin B12. These medicines include: heartburn medications; some antibiotics; some medications used to treat diabetes, gout, and high cholesterol.  . In some cases, there are no symptoms of  this condition. If the condition leads to anemia or nerve damage, various symptoms can occur, such as weakness or fatigue, shortness of breath, and numbness or tingling in your hands and feet.   . Treatment:  o May include taking vitamin B12 supplements.  o Avoid alcohol.  o Eat lots of healthy foods that contain vitamin B12: - Beef, pork, chicken, [place], and organ meats, such as liver.  - Seafood: This includes clams, rainbow trout, salmon, tuna, and haddock.  - Eggs.  - Cereal and dairy products that are fortified: This means that vitamin B12 has been added to the food.     Depression screening. Meaghen had a negative depression screening.  Class 1 obesity with serious comorbidity and body mass index (BMI) of 30.0 to 30.9 in adult, unspecified obesity type.  Cheril is currently in the action stage of change and her goal is to continue with weight loss efforts. I recommend Vianna begin the structured treatment plan as follows:  She has agreed to the Category 2 Plan.  She will work on meal planning and decreasing portion size.  We reviewed labs from 04/02/2018 with the patient including CMP, CBC, and glucose.   Exercise goals: Jalanie walks 45-60 minutes 4-5 times per week and works with a Physiological scientist.   Behavioral modification strategies: increasing lean protein intake, decreasing simple carbohydrates,  increasing vegetables, increasing water intake, decreasing eating out, no skipping meals, meal planning and cooking strategies, keeping healthy foods in the home and planning for success.  She was informed of the importance of frequent follow-up visits to maximize her success with intensive lifestyle modifications for her multiple health conditions. She was informed we would discuss her lab results at her next visit unless there is a critical issue that needs to be addressed sooner. Dejon agreed to keep her next visit at the agreed upon time to discuss these results.  Objective:   Blood  pressure 133/71, pulse 93, temperature 98.2 F (36.8 C), height 5\' 3"  (1.6 m), weight 168 lb (76.2 kg), last menstrual period 11/28/2004, SpO2 98 %. Body mass index is 29.76 kg/m.  EKG: Sinus  Rhythm with a rate of 90 BPM. Old anterior infarct. Nonspecific T-abnormality. Abnormal.    Indirect Calorimeter completed today shows a VO2 of 243 and a REE of 1688.  Her calculated basal metabolic rate is [id number] thus her basal metabolic rate is better than expected.  General: Cooperative, alert, well developed, in no acute distress. HEENT: Conjunctivae and lids unremarkable. Cardiovascular: Regular rhythm.  Lungs: Normal work of breathing. Neurologic: No focal deficits.   Lab Results  Component Value Date   CREATININE 1.26 (H) 04/02/2018   BUN 23 04/02/2018   NA 140 04/02/2018   K 4.0 04/02/2018   CL 106 04/02/2018   CO2 27 04/02/2018   Lab Results  Component Value Date   ALT 12 04/02/2018   AST 19 04/02/2018   ALKPHOS 57 04/02/2018   BILITOT 0.5 04/02/2018   No results found for: HGBA1C No results found for: INSULIN No results found for: TSH No results found for: CHOL, HDL, LDLCALC, LDLDIRECT, TRIG, CHOLHDL Lab Results  Component Value Date   WBC 6.1 04/02/2018   HGB 13.0 04/02/2018   HCT 40.0 04/02/2018   MCV 85.7 04/02/2018   PLT 247 04/02/2018   No results found for: IRON, TIBC, FERRITIN  Obesity Behavioral Intervention Visit Documentation for Insurance:   Approximately 15 minutes were spent on the discussion below.  ASK: We discussed the diagnosis of obesity with [place] today and Jakyra agreed to give [place] permission to discuss obesity behavioral modification therapy today.  ASSESS: Ayliah has the diagnosis of obesity and her BMI today is 29.8. Valisa is in the action stage of change.   ADVISE: Heli was educated on the multiple health risks of obesity as well as the benefit of weight loss to improve her health. She was advised of the need for long term treatment and the  importance of lifestyle modifications to improve her current health and to decrease her risk of future health problems.  AGREE: Multiple dietary modification options and treatment options were discussed and Rokhaya agreed to follow the recommendations documented in the above note.  ARRANGE: Rosell was educated on the importance of frequent visits to treat obesity as outlined per CMS and USPSTF guidelines and agreed to schedule her next follow up appointment today.  Attestation Statements:   Reviewed by clinician on day of visit: allergies, medications, problem list, medical history, surgical history, family history, social history, and previous encounter notes.  [name], am acting as Location manager for [employer], DO   I have reviewed the above documentation for accuracy and completeness, and I agree with the above. Jearld Lesch, DO

## 2020-02-20 LAB — FOLATE: Folate: 7.1 ng/mL (ref >3.0)

## 2020-02-20 LAB — VITAMIN B12: Vitamin B-12: 510 pg/mL (ref 232–1245)

## 2020-02-20 LAB — INSULIN, RANDOM: INSULIN: 15 u[IU]/mL (ref 2.6–24.9)

## 2020-02-26 ENCOUNTER — Ambulatory Visit (INDEPENDENT_AMBULATORY_CARE_PROVIDER_SITE_OTHER): Payer: Medicare Other | Admitting: Licensed Clinical Social Worker

## 2020-02-26 DIAGNOSIS — F419 Anxiety disorder, unspecified: Secondary | ICD-10-CM | POA: Diagnosis not present

## 2020-03-01 ENCOUNTER — Encounter (INDEPENDENT_AMBULATORY_CARE_PROVIDER_SITE_OTHER): Payer: Self-pay | Admitting: Bariatrics

## 2020-03-04 ENCOUNTER — Other Ambulatory Visit: Payer: Self-pay

## 2020-03-04 ENCOUNTER — Encounter (INDEPENDENT_AMBULATORY_CARE_PROVIDER_SITE_OTHER): Payer: Self-pay | Admitting: Bariatrics

## 2020-03-04 ENCOUNTER — Ambulatory Visit (INDEPENDENT_AMBULATORY_CARE_PROVIDER_SITE_OTHER): Payer: Medicare Other | Admitting: Bariatrics

## 2020-03-04 VITALS — BP 128/88 | HR 71 | Temp 97.6°F | Ht 62.0 in | Wt 163.0 lb

## 2020-03-04 DIAGNOSIS — E7849 Other hyperlipidemia: Secondary | ICD-10-CM

## 2020-03-04 DIAGNOSIS — Z6830 Body mass index (BMI) 30.0-30.9, adult: Secondary | ICD-10-CM | POA: Diagnosis not present

## 2020-03-04 DIAGNOSIS — E669 Obesity, unspecified: Secondary | ICD-10-CM | POA: Diagnosis not present

## 2020-03-04 DIAGNOSIS — R7303 Prediabetes: Secondary | ICD-10-CM | POA: Diagnosis not present

## 2020-03-04 NOTE — Progress Notes (Signed)
Chief Complaint:   OBESITY Anita Randolph is here to discuss her progress with her obesity treatment plan along with follow-up of her obesity related diagnoses. Anita Randolph is on the Category 2 Plan and states she is following her eating plan approximately 99% of the time. Anita Randolph states she is working with weights 14 minutes 3 times per week and walking 2-3 miles 7 times per week.  Today's visit was #: 2 Starting weight: 168 lbs Starting date: 02/19/2020 Today's weight: 163 lbs Today's date: 03/04/2020 Total lbs lost to date: 5 Total lbs lost since last in-office visit: 5  Interim History: Anita Randolph is down 5 lbs. She had difficulty with eating so much meat.  Subjective:   Prediabetes. Anita Randolph has a diagnosis of prediabetes based on her elevated HgA1c and was informed this puts her at greater risk of developing diabetes. She continues to work on diet and exercise to decrease her risk of diabetes. She denies nausea or hypoglycemia. A1c 5.8.  No results found for: HGBA1C Lab Results  Component Value Date   INSULIN 15.0 02/19/2020   Other hyperlipidemia. Anita Randolph has hyperlipidemia and has been trying to improve her cholesterol levels with intensive lifestyle modification including a low saturated fat diet, exercise and weight loss. She denies any chest pain, claudication or myalgias. Anita Randolph is taking Zocor.  Lab Results  Component Value Date   ALT 12 04/02/2018   AST 19 04/02/2018   ALKPHOS 57 04/02/2018   BILITOT 0.5 04/02/2018   No results found for: CHOL, HDL, LDLCALC, LDLDIRECT, TRIG, CHOLHDL   Assessment/Plan:   Prediabetes. Anita Randolph will continue to work on weight loss, exercise, and decreasing simple carbohydrates to help decrease the risk of diabetes. We discussed Insulin Resistance/Prediabetes. Will follow insulin and A1c.  Other hyperlipidemia. Cardiovascular risk and specific lipid/LDL goals reviewed.  We discussed several lifestyle modifications today and Anita Randolph will continue to work  on diet, exercise and weight loss efforts. Orders and follow up as documented in patient record. [name] will continue Zocor as directed. She will read labels (decreasing saturated fats, avoiding trans fats, and increasing MUFA's and PUFA's).  Counseling Intensive lifestyle modifications are the first line treatment for this issue. . Dietary changes: Increase soluble fiber. Decrease simple carbohydrates. . Exercise changes: Moderate to vigorous-intensity aerobic activity 150 minutes per week if tolerated. . Lipid-lowering medications: see documented in medical record.  Class 1 obesity with serious comorbidity and body mass index (BMI) of 30.0 to 30.9 in adult, unspecified obesity type - BMI greater than 30 at start of program.  Anita Randolph is currently in the action stage of change. As such, her goal is to continue with weight loss efforts. She has agreed to the Category 2 Plan and will journal 200-300 calories and 20+ grams of protein at breakfast.   She will work on meal planning and mindful eating. She was given handout on Eating Out.  We independently reviewed with the patient labs from 02/19/2020 including folate, B12, and insulin.  Exercise goals: Older adults should follow the adult guidelines. When older adults cannot meet the adult guidelines, they should be as physically active as their abilities and conditions will allow. Anita Randolph will continue her current activities.  Behavioral modification strategies: increasing lean protein intake, decreasing simple carbohydrates, increasing vegetables, increasing water intake, decreasing eating out, no skipping meals, meal planning and cooking strategies, keeping healthy foods in the home and planning for success.  Anita Randolph has agreed to follow-up with our clinic in 3 weeks. She was  informed of the importance of frequent follow-up visits to maximize her success with intensive lifestyle modifications for her multiple health conditions.   Objective:   Blood  pressure 128/88, pulse 71, temperature 97.6 F (36.4 C), height 5\' 2"  (1.575 m), weight 163 lb (73.9 kg), last menstrual period 11/28/2004, SpO2 99 %. Body mass index is 29.81 kg/m.  General: Cooperative, alert, well developed, in no acute distress. HEENT: Conjunctivae and lids unremarkable. Cardiovascular: Regular rhythm.  Lungs: Normal work of breathing. Neurologic: No focal deficits.   Lab Results  Component Value Date   CREATININE 1.26 (H) 04/02/2018   BUN 23 04/02/2018   NA 140 04/02/2018   K 4.0 04/02/2018   CL 106 04/02/2018   CO2 27 04/02/2018   Lab Results  Component Value Date   ALT 12 04/02/2018   AST 19 04/02/2018   ALKPHOS 57 04/02/2018   BILITOT 0.5 04/02/2018   No results found for: HGBA1C Lab Results  Component Value Date   INSULIN 15.0 02/19/2020   No results found for: TSH No results found for: CHOL, HDL, LDLCALC, LDLDIRECT, TRIG, CHOLHDL Lab Results  Component Value Date   WBC 6.1 04/02/2018   HGB 13.0 04/02/2018   HCT 40.0 04/02/2018   MCV 85.7 04/02/2018   PLT 247 04/02/2018   No results found for: IRON, TIBC, FERRITIN  Attestation Statements:   Reviewed by clinician on day of visit: allergies, medications, problem list, medical history, surgical history, family history, social history, and previous encounter notes.  Time spent on visit including pre-visit chart review and post-visit charting and care was 30 minutes.   [name], am acting as Location manager for [employer], DO   I have reviewed the above documentation for accuracy and completeness, and I agree with the above. Jearld Lesch, DO

## 2020-03-12 ENCOUNTER — Other Ambulatory Visit: Payer: Self-pay

## 2020-03-12 ENCOUNTER — Encounter (INDEPENDENT_AMBULATORY_CARE_PROVIDER_SITE_OTHER): Payer: Medicare Other | Admitting: Ophthalmology

## 2020-03-12 DIAGNOSIS — H2701 Aphakia, right eye: Secondary | ICD-10-CM

## 2020-03-24 ENCOUNTER — Other Ambulatory Visit: Payer: Self-pay

## 2020-03-24 ENCOUNTER — Ambulatory Visit (INDEPENDENT_AMBULATORY_CARE_PROVIDER_SITE_OTHER): Payer: Medicare Other | Admitting: Psychology

## 2020-03-24 ENCOUNTER — Encounter (INDEPENDENT_AMBULATORY_CARE_PROVIDER_SITE_OTHER): Payer: Self-pay | Admitting: Bariatrics

## 2020-03-24 ENCOUNTER — Ambulatory Visit (INDEPENDENT_AMBULATORY_CARE_PROVIDER_SITE_OTHER): Payer: Medicare Other | Admitting: Bariatrics

## 2020-03-24 VITALS — BP 135/90 | HR 79 | Temp 97.6°F | Ht 62.0 in | Wt 160.0 lb

## 2020-03-24 DIAGNOSIS — F419 Anxiety disorder, unspecified: Secondary | ICD-10-CM

## 2020-03-24 DIAGNOSIS — E669 Obesity, unspecified: Secondary | ICD-10-CM

## 2020-03-24 DIAGNOSIS — E7849 Other hyperlipidemia: Secondary | ICD-10-CM

## 2020-03-24 DIAGNOSIS — Z6830 Body mass index (BMI) 30.0-30.9, adult: Secondary | ICD-10-CM | POA: Diagnosis not present

## 2020-03-24 DIAGNOSIS — M8580 Other specified disorders of bone density and structure, unspecified site: Secondary | ICD-10-CM

## 2020-03-25 NOTE — Progress Notes (Signed)
Chief Complaint:   OBESITY Anita Randolph is here to discuss Anita Randolph progress with Anita Randolph obesity treatment plan along with follow-up of Anita Randolph obesity related diagnoses. Anita Randolph is on the Category 2 Plan and states she is following Anita Randolph eating plan approximately 40% of the time. Anita Randolph states she is walking 12,000 steps 7 times per week.  Today's visit was #: 3 Starting weight: 168 lbs Starting date: 02/19/2020 Today's weight: 160 lbs Today's date: 03/24/2020 Total lbs lost to date: 8 Total lbs lost since last in-office visit: 3  Interim History: Anita Randolph is down 3 lbs. She was on a trip and still managed to lose weight. She states she did get in Anita Randolph protein.  Subjective:   Other hyperlipidemia. Anita Randolph is taking Zocor.   No results found for: CHOL, HDL, LDLCALC, LDLDIRECT, TRIG, CHOLHDL Lab Results  Component Value Date   ALT 12 04/02/2018   AST 19 04/02/2018   ALKPHOS 57 04/02/2018   BILITOT 0.5 04/02/2018   The ASCVD Risk score Anita Randolph DC Jr., et al., 2013) failed to calculate for the following reasons:   Cannot find a previous HDL lab   Cannot find a previous total cholesterol lab  Osteopenia, unspecified location. Anita Randolph is taking calcium carbonate and Vitamin D.  Assessment/Plan:   Other hyperlipidemia. Cardiovascular risk and specific lipid/LDL goals reviewed.  We discussed several lifestyle modifications today and Anita Randolph will continue to work on diet, exercise and weight loss efforts. Orders and follow up as documented in patient record. She will continue Anita Randolph Zocor as directed and will decrease saturated fats.  Counseling Intensive lifestyle modifications are the first line treatment for this issue. . Dietary changes: Increase soluble fiber. Decrease simple carbohydrates. . Exercise changes: Moderate to vigorous-intensity aerobic activity 150 minutes per week if tolerated. . Lipid-lowering medications: see documented in medical record.  Osteopenia, unspecified location. Anita Randolph will  continue Anita Randolph medication as directed. She will do cardio and resistance training.  Class 1 obesity with serious comorbidity and body mass index (BMI) of 30.0 to 30.9 in adult, unspecified obesity type - starting BMI was greater than 30.  Anita Randolph is currently in the action stage of change. As such, Anita Randolph goal is to continue with weight loss efforts. She has agreed to the Category 2 Plan.   She will work on meal planning and mindful eating.  Exercise goals: Older adults should follow the adult guidelines. When older adults cannot meet the adult guidelines, they should be as physically active as their abilities and conditions will allow.   Behavioral modification strategies: increasing lean protein intake, decreasing simple carbohydrates, increasing vegetables, increasing water intake, decreasing eating out, no skipping meals, meal planning and cooking strategies, keeping healthy foods in the home and avoiding temptations.  Anita Randolph has agreed to follow-up with our clinic in 2 weeks. She was informed of the importance of frequent follow-up visits to maximize Anita Randolph success with intensive lifestyle modifications for Anita Randolph multiple health conditions.   Objective:   Blood pressure 135/90, pulse 79, temperature 97.6 F (36.4 C), height 5\' 2"  (1.575 m), weight 160 lb (72.6 kg), last menstrual period 11/28/2004, SpO2 98 %. Body mass index is 29.26 kg/m.  General: Cooperative, alert, well developed, in no acute distress. HEENT: Conjunctivae and lids unremarkable. Cardiovascular: Regular rhythm.  Lungs: Normal work of breathing. Neurologic: No focal deficits.   Lab Results  Component Value Date   CREATININE 1.26 (H) 04/02/2018   BUN 23 04/02/2018   NA 140 04/02/2018   K 4.0  04/02/2018   CL 106 04/02/2018   CO2 27 04/02/2018   Lab Results  Component Value Date   ALT 12 04/02/2018   AST 19 04/02/2018   ALKPHOS 57 04/02/2018   BILITOT 0.5 04/02/2018   No results found for: HGBA1C Lab Results    Component Value Date   INSULIN 15.0 02/19/2020   No results found for: TSH No results found for: CHOL, HDL, LDLCALC, LDLDIRECT, TRIG, CHOLHDL Lab Results  Component Value Date   WBC 6.1 04/02/2018   HGB 13.0 04/02/2018   HCT 40.0 04/02/2018   MCV 85.7 04/02/2018   PLT 247 04/02/2018   No results found for: IRON, TIBC, FERRITIN  Obesity Behavioral Intervention Documentation for Insurance:   Approximately 15 minutes were spent on the discussion below.  ASK: We discussed the diagnosis of obesity with Anita Randolph today and Anita Randolph agreed to give [place] permission to discuss obesity behavioral modification therapy today.  ASSESS: Anita Randolph has the diagnosis of obesity and Anita Randolph BMI today is 29.3. Anita Randolph is in the action stage of change.   ADVISE: Anita Randolph was educated on the multiple health risks of obesity as well as the benefit of weight loss to improve Anita Randolph health. She was advised of the need for long term treatment and the importance of lifestyle modifications to improve Anita Randolph current health and to decrease Anita Randolph risk of future health problems.  AGREE: Multiple dietary modification options and treatment options were discussed and Anita Randolph agreed to follow the recommendations documented in the above note.  ARRANGE: Anita Randolph was educated on the importance of frequent visits to treat obesity as outlined per CMS and USPSTF guidelines and agreed to schedule Anita Randolph next follow up appointment today.  Attestation Statements:   Reviewed by clinician on day of visit: allergies, medications, problem list, medical history, surgical history, family history, social history, and previous encounter notes.  Anita Randolph, am acting as Location manager for [employer], DO   I have reviewed the above documentation for accuracy and completeness, and I agree with the above. Anita Lesch, DO

## 2020-03-26 ENCOUNTER — Encounter (INDEPENDENT_AMBULATORY_CARE_PROVIDER_SITE_OTHER): Payer: Self-pay | Admitting: Bariatrics

## 2020-03-27 ENCOUNTER — Encounter: Payer: Self-pay | Admitting: Podiatry

## 2020-03-27 ENCOUNTER — Ambulatory Visit (INDEPENDENT_AMBULATORY_CARE_PROVIDER_SITE_OTHER): Payer: Medicare Other | Admitting: Podiatry

## 2020-03-27 ENCOUNTER — Other Ambulatory Visit: Payer: Self-pay

## 2020-03-27 DIAGNOSIS — L600 Ingrowing nail: Secondary | ICD-10-CM | POA: Diagnosis not present

## 2020-03-27 DIAGNOSIS — B351 Tinea unguium: Secondary | ICD-10-CM

## 2020-03-27 DIAGNOSIS — M79674 Pain in right toe(s): Secondary | ICD-10-CM

## 2020-03-27 NOTE — Progress Notes (Signed)
Subjective:   Patient ID: Anita Randolph, female   DOB: 69 y.o.   MRN: [id number]   HPI Patient states she has been having trouble with this ingrown toenail and also there is some thickness in her nailbed and it gets sore.  States that it is become gradually more of an issue for her and she wants to know about the fungus and also about ingrown toenail   ROS      Objective:  Physical Exam  Neurovascular status intact with patient found to have an incurvated right hallux nail that is thick and it gets irritated more on the medial versus lateral corner and there is dystrophic changes within the nailbed with pain     Assessment:  Several problems with 1 being a chronic ingrown toenail of the corner and the other being mycotic nail infection of nailbeds H     Plan:  P reviewed conditions and discussed.  Reviewed the pros and cons of permanent nail matrixectomy and also fungal care and at this time we will just get a focus on nail debridement with sterile instrumentation used today and carefully remove the medial lateral corners allow channels for drainage.  Patient will be seen back for [place] to recheck again and I advised her on permanent procedure with education given today

## 2020-04-09 ENCOUNTER — Ambulatory Visit (INDEPENDENT_AMBULATORY_CARE_PROVIDER_SITE_OTHER): Payer: Medicare Other | Admitting: Bariatrics

## 2020-04-09 ENCOUNTER — Encounter (INDEPENDENT_AMBULATORY_CARE_PROVIDER_SITE_OTHER): Payer: Self-pay | Admitting: Bariatrics

## 2020-04-09 ENCOUNTER — Other Ambulatory Visit: Payer: Self-pay

## 2020-04-09 VITALS — BP 129/84 | HR 74 | Temp 98.2°F | Ht 62.0 in | Wt 159.0 lb

## 2020-04-09 DIAGNOSIS — E7849 Other hyperlipidemia: Secondary | ICD-10-CM

## 2020-04-09 DIAGNOSIS — Z6830 Body mass index (BMI) 30.0-30.9, adult: Secondary | ICD-10-CM | POA: Diagnosis not present

## 2020-04-09 DIAGNOSIS — E559 Vitamin D deficiency, unspecified: Secondary | ICD-10-CM

## 2020-04-09 DIAGNOSIS — E669 Obesity, unspecified: Secondary | ICD-10-CM | POA: Diagnosis not present

## 2020-04-13 NOTE — Progress Notes (Signed)
Chief Complaint:   OBESITY Anita Randolph is here to discuss her progress with her obesity treatment plan along with follow-up of her obesity related diagnoses. Anita Randolph is on the Category 2 Plan. Anita Randolph states she is walking 10,000 steps 3 times per week and working with weights 40 minutes 7 times per week.  Today's visit was #: 4 Starting weight: 168 lbs Starting date: 02/19/2020 Today's weight: 159 lbs Today's date: 04/09/2020 Total lbs lost to date: 9 Total lbs lost since last in-office visit: 1  Interim History: Anita Randolph is down 1 lb. She had trouble getting back on plan and still struggles with protein.  Subjective:   Other hyperlipidemia. Anita Randolph is taking Zocor.   No results found for: CHOL, HDL, LDLCALC, LDLDIRECT, TRIG, CHOLHDL Lab Results  Component Value Date   ALT 12 04/02/2018   AST 19 04/02/2018   ALKPHOS 57 04/02/2018   BILITOT 0.5 04/02/2018   The ASCVD Risk score [name], et al., 2013) failed to calculate for the following reasons:   Cannot find a previous HDL lab   Cannot find a previous total cholesterol lab  Vitamin D deficiency. Anita Randolph is taking Vitamin D OTC.  Assessment/Plan:   Other hyperlipidemia. Cardiovascular risk and specific lipid/LDL goals reviewed.  We discussed several lifestyle modifications today and Anita Randolph will continue to work on diet, exercise and weight loss efforts. Orders and follow up as documented in patient record. She will continue Zocor as directed.  Counseling Intensive lifestyle modifications are the first line treatment for this issue. . Dietary changes: Increase soluble fiber. Decrease simple carbohydrates. . Exercise changes: Moderate to vigorous-intensity aerobic activity 150 minutes per week if tolerated. . Lipid-lowering medications: see documented in medical record.  Vitamin D deficiency. Low Vitamin D level contributes to fatigue and are associated with obesity, breast, and colon cancer. She agrees to continue to take  OTC Vitamin D and will follow-up for routine testing of Vitamin D, at least 2-3 times per year to avoid over-replacement.  Class 1 obesity with serious comorbidity and body mass index (BMI) of 30.0 to 30.9 in adult, unspecified obesity type - starting BMA > than 30.  Anita Randolph is currently in the action stage of change. As such, her goal is to continue with weight loss efforts. She has agreed to the Category 2 Plan.   She will work on meal planning, intentional eating, and will increase her protein intake.  Exercise goals: Anita Randolph will continue walking 10,000 steps and continue weights and resistance.  Behavioral modification strategies: increasing lean protein intake, decreasing simple carbohydrates, increasing vegetables, increasing water intake, decreasing eating out, no skipping meals, meal planning and cooking strategies, keeping healthy foods in the home and planning for success.  Anita Randolph has agreed to follow-up with our clinic in 2 weeks. She was informed of the importance of frequent follow-up visits to maximize her success with intensive lifestyle modifications for her multiple health conditions.   Objective:   Blood pressure 129/84, pulse 74, temperature 98.2 F (36.8 C), last menstrual period 11/28/2004, SpO2 97 %. There is no height or weight on file to calculate BMI.  General: Cooperative, alert, well developed, in no acute distress. HEENT: Conjunctivae and lids unremarkable. Cardiovascular: Regular rhythm.  Lungs: Normal work of breathing. Neurologic: No focal deficits.   Lab Results  Component Value Date   CREATININE 1.26 (H) 04/02/2018   BUN 23 04/02/2018   NA 140 04/02/2018   K 4.0 04/02/2018   CL 106 04/02/2018  CO2 27 04/02/2018   Lab Results  Component Value Date   ALT 12 04/02/2018   AST 19 04/02/2018   ALKPHOS 57 04/02/2018   BILITOT 0.5 04/02/2018   No results found for: HGBA1C Lab Results  Component Value Date   INSULIN 15.0 02/19/2020   No results  found for: TSH No results found for: CHOL, HDL, LDLCALC, LDLDIRECT, TRIG, CHOLHDL Lab Results  Component Value Date   WBC 6.1 04/02/2018   HGB 13.0 04/02/2018   HCT 40.0 04/02/2018   MCV 85.7 04/02/2018   PLT 247 04/02/2018   No results found for: IRON, TIBC, FERRITIN  Obesity Behavioral Intervention Documentation for Insurance:   Approximately 15 minutes were spent on the discussion below.  ASK: We discussed the diagnosis of obesity with Anita Randolph today and Anita Randolph agreed to give [place] permission to discuss obesity behavioral modification therapy today.  ASSESS: Anita Randolph has the diagnosis of obesity and her BMI today is 29.2. [place] is in the action stage of change.   ADVISE: Neely was educated on the multiple health risks of obesity as well as the benefit of weight loss to improve her health. She was advised of the need for long term treatment and the importance of lifestyle modifications to improve her current health and to decrease her risk of future health problems.  AGREE: Multiple dietary modification options and treatment options were discussed and Manessa agreed to follow the recommendations documented in the above note.  ARRANGE: Leairah was educated on the importance of frequent visits to treat obesity as outlined per CMS and USPSTF guidelines and agreed to schedule her next follow up appointment today.  Attestation Statements:   Reviewed by clinician on day of visit: allergies, medications, problem list, medical history, surgical history, family history, social history, and previous encounter notes.  [name], am acting as Location manager for [employer], DO   I have reviewed the above documentation for accuracy and completeness, and I agree with the above. Jearld Lesch, DO

## 2020-04-14 ENCOUNTER — Ambulatory Visit: Payer: Medicare Other | Admitting: Psychology

## 2020-04-30 ENCOUNTER — Ambulatory Visit (INDEPENDENT_AMBULATORY_CARE_PROVIDER_SITE_OTHER): Payer: Medicare Other | Admitting: Bariatrics

## 2020-04-30 ENCOUNTER — Other Ambulatory Visit: Payer: Self-pay

## 2020-04-30 ENCOUNTER — Encounter (INDEPENDENT_AMBULATORY_CARE_PROVIDER_SITE_OTHER): Payer: Self-pay | Admitting: Bariatrics

## 2020-04-30 VITALS — BP 109/75 | HR 78 | Temp 97.8°F | Ht 62.0 in | Wt 158.0 lb

## 2020-04-30 DIAGNOSIS — E669 Obesity, unspecified: Secondary | ICD-10-CM

## 2020-04-30 DIAGNOSIS — E7849 Other hyperlipidemia: Secondary | ICD-10-CM

## 2020-04-30 DIAGNOSIS — Z6830 Body mass index (BMI) 30.0-30.9, adult: Secondary | ICD-10-CM

## 2020-04-30 DIAGNOSIS — E559 Vitamin D deficiency, unspecified: Secondary | ICD-10-CM | POA: Diagnosis not present

## 2020-04-30 NOTE — Progress Notes (Signed)
Chief Complaint:   OBESITY Anita Randolph is here to discuss her progress with her obesity treatment plan along with follow-up of her obesity related diagnoses. Anita Randolph is on the Category 2 Plan and states she is following her eating plan approximately 40% of the time. Anita Randolph states she is walking 10,000 steps 7 times per week.  Today's visit was #: 5 Starting weight: 168 lbs Starting date: 02/19/2020 Today's weight: 158 lbs Today's date: 04/30/2020 Total lbs lost to date: 10 Total lbs lost since last in-office visit: 1  Interim History: Anita Randolph is down 1 lb. She continues to travel and will continue to travel.  Subjective:   Other hyperlipidemia. Anita Randolph is taking Zocor.   No results found for: CHOL, HDL, LDLCALC, LDLDIRECT, TRIG, CHOLHDL Lab Results  Component Value Date   ALT 12 04/02/2018   AST 19 04/02/2018   ALKPHOS 57 04/02/2018   BILITOT 0.5 04/02/2018   The ASCVD Risk score [name], et al., 2013) failed to calculate for the following reasons:   Cannot find a previous HDL lab   Cannot find a previous total cholesterol lab  Vitamin D deficiency. Anita Randolph is taking Vitamin D supplementation. Last Vitamin D was 27.4 on 01/05/2016.  Assessment/Plan:   Other hyperlipidemia. Cardiovascular risk and specific lipid/LDL goals reviewed.  We discussed several lifestyle modifications today and Anita Randolph will continue to work on diet, exercise and weight loss efforts. Orders and follow up as documented in patient record. She will continue Zocor as directed.  Counseling Intensive lifestyle modifications are the first line treatment for this issue. . Dietary changes: Increase soluble fiber. Decrease simple carbohydrates. . Exercise changes: Moderate to vigorous-intensity aerobic activity 150 minutes per week if tolerated. . Lipid-lowering medications: see documented in medical record.  Vitamin D deficiency. Low Vitamin D level contributes to fatigue and are associated with obesity,  breast, and colon cancer. She agrees to continue to take OTC Vitamin D and will follow-up for routine testing of Vitamin D, at least 2-3 times per year to avoid over-replacement.  Class 1 obesity with serious comorbidity and body mass index (BMI) of 30.0 to 30.9 in adult, unspecified obesity type - BMI greater than 30 at start.  Anita Randolph is currently in the action stage of change. As such, her goal is to continue with weight loss efforts. She has agreed to the Category 2 Plan.   She will work on meal planning and intentional eating.   Exercise goals: Anita Randolph will continue walking for exercise.  Behavioral modification strategies: increasing lean protein intake, decreasing simple carbohydrates, increasing vegetables, increasing water intake, decreasing eating out, no skipping meals, meal planning and cooking strategies, keeping healthy foods in the home and planning for success.  Anita Randolph has agreed to follow-up with our clinic in 4-5 weeks. She was informed of the importance of frequent follow-up visits to maximize her success with intensive lifestyle modifications for her multiple health conditions.   Objective:   Blood pressure 109/75, pulse 78, temperature 97.8 F (36.6 C), height 5\' 2"  (1.575 m), weight 158 lb (71.7 kg), last menstrual period 11/28/2004, SpO2 98 %. Body mass index is 28.9 kg/m.  General: Cooperative, alert, well developed, in no acute distress. HEENT: Conjunctivae and lids unremarkable. Cardiovascular: Regular rhythm.  Lungs: Normal work of breathing. Neurologic: No focal deficits.   Lab Results  Component Value Date   CREATININE 1.26 (H) 04/02/2018   BUN 23 04/02/2018   NA 140 04/02/2018   K 4.0 04/02/2018  CL 106 04/02/2018   CO2 27 04/02/2018   Lab Results  Component Value Date   ALT 12 04/02/2018   AST 19 04/02/2018   ALKPHOS 57 04/02/2018   BILITOT 0.5 04/02/2018   No results found for: HGBA1C Lab Results  Component Value Date   INSULIN 15.0 02/19/2020    No results found for: TSH No results found for: CHOL, HDL, LDLCALC, LDLDIRECT, TRIG, CHOLHDL Lab Results  Component Value Date   WBC 6.1 04/02/2018   HGB 13.0 04/02/2018   HCT 40.0 04/02/2018   MCV 85.7 04/02/2018   PLT 247 04/02/2018   No results found for: IRON, TIBC, FERRITIN  Obesity Behavioral Intervention Documentation for Insurance:   Approximately 15 minutes were spent on the discussion below.  ASK: We discussed the diagnosis of obesity with Anita Randolph today and Anita Randolph agreed to give [place] permission to discuss obesity behavioral modification therapy today.  ASSESS: Anita Randolph has the diagnosis of obesity and her BMI today is 28.9. Anita Randolph is in the action stage of change.   ADVISE: Anita Randolph was educated on the multiple health risks of obesity as well as the benefit of weight loss to improve her health. She was advised of the need for long term treatment and the importance of lifestyle modifications to improve her current health and to decrease her risk of future health problems.  AGREE: Multiple dietary modification options and treatment options were discussed and Anita Randolph agreed to follow the recommendations documented in the above note.  ARRANGE: Anita Randolph was educated on the importance of frequent visits to treat obesity as outlined per CMS and USPSTF guidelines and agreed to schedule her next follow up appointment today.  Attestation Statements:   Reviewed by clinician on day of visit: allergies, medications, problem list, medical history, surgical history, family history, social history, and previous encounter notes.  [name], am acting as Location manager for [employer], DO   I have reviewed the above documentation for accuracy and completeness, and I agree with the above. Jearld Lesch, DO

## 2020-05-05 ENCOUNTER — Ambulatory Visit: Payer: Medicare Other | Admitting: Psychology

## 2020-05-08 ENCOUNTER — Encounter (INDEPENDENT_AMBULATORY_CARE_PROVIDER_SITE_OTHER): Payer: Self-pay | Admitting: Bariatrics

## 2020-05-14 DIAGNOSIS — Z20828 Contact with and (suspected) exposure to other viral communicable diseases: Secondary | ICD-10-CM | POA: Diagnosis not present

## 2020-05-14 DIAGNOSIS — Z1159 Encounter for screening for other viral diseases: Secondary | ICD-10-CM | POA: Diagnosis not present

## 2020-05-14 DIAGNOSIS — Z03818 Encounter for observation for suspected exposure to other biological agents ruled out: Secondary | ICD-10-CM | POA: Diagnosis not present

## 2020-05-28 ENCOUNTER — Ambulatory Visit (INDEPENDENT_AMBULATORY_CARE_PROVIDER_SITE_OTHER): Payer: Medicare Other | Admitting: Bariatrics

## 2020-05-28 ENCOUNTER — Encounter (INDEPENDENT_AMBULATORY_CARE_PROVIDER_SITE_OTHER): Payer: Self-pay | Admitting: Bariatrics

## 2020-05-28 ENCOUNTER — Other Ambulatory Visit: Payer: Self-pay

## 2020-05-28 VITALS — BP 136/91 | HR 84 | Temp 97.8°F | Ht 62.0 in | Wt 156.0 lb

## 2020-05-28 DIAGNOSIS — E559 Vitamin D deficiency, unspecified: Secondary | ICD-10-CM | POA: Diagnosis not present

## 2020-05-28 DIAGNOSIS — E669 Obesity, unspecified: Secondary | ICD-10-CM

## 2020-05-28 DIAGNOSIS — R5383 Other fatigue: Secondary | ICD-10-CM | POA: Diagnosis not present

## 2020-05-28 DIAGNOSIS — R7309 Other abnormal glucose: Secondary | ICD-10-CM

## 2020-05-28 DIAGNOSIS — E7849 Other hyperlipidemia: Secondary | ICD-10-CM

## 2020-05-28 DIAGNOSIS — Z6830 Body mass index (BMI) 30.0-30.9, adult: Secondary | ICD-10-CM

## 2020-05-28 NOTE — Progress Notes (Signed)
Chief Complaint:   OBESITY Anita Randolph is here to discuss her progress with her obesity treatment plan along with follow-up of her obesity related diagnoses. Anita Randolph is on the Category 2 Plan and states she is following her eating plan approximately 50% of the time. Anita Randolph states she is walking 60 minutes 5 times per week.  Today's visit was #: 6 Starting weight: 168 lbs Starting date: 02/19/2020 Today's weight: 156 lbs Today's date: 05/28/2020 Total lbs lost to date: 12 Total lbs lost since last in-office visit: 2  Interim History: Anita Randolph is down an additional 2 lbs. She has been on a trip.  Subjective:   Vitamin D deficiency. Anita Randolph is taking OTC Vitamin D supplementation.   Other hyperlipidemia. Anita Randolph is taking Zocor.   No results found for: CHOL, HDL, LDLCALC, LDLDIRECT, TRIG, CHOLHDL Lab Results  Component Value Date   ALT 12 04/02/2018   AST 19 04/02/2018   ALKPHOS 57 04/02/2018   BILITOT 0.5 04/02/2018   The ASCVD Risk score Anita Bussing DC Jr., Anita al., Anita Randolph) failed to calculate for the following reasons:   Cannot find a previous HDL lab   Cannot find a previous total cholesterol lab  Elevated glucose. Anita Randolph reports having a normal appetite.  Other fatigue. Anita Randolph endorses fatigue with certain activities.  Assessment/Plan:   Vitamin D deficiency. Low Vitamin D level contributes to fatigue and are associated with obesity, breast, and colon cancer. She agrees to continue to take OTC Vitamin D and VITAMIN D 25 Hydroxy (Vit-D Deficiency, Fractures) level was ordered today.  Other hyperlipidemia. Cardiovascular risk and specific lipid/LDL goals reviewed.  We discussed several lifestyle modifications today and Anita Randolph will continue to work on diet, exercise and weight loss efforts. Orders and follow up as documented in patient record. Anita Randolph will continue her medication as directed. Insulin, random, Lipid Panel With LDL/HDL Ratio labs ordered today.   Counseling Intensive  lifestyle modifications are the first line treatment for this issue. . Dietary changes: Increase soluble fiber. Decrease simple carbohydrates. . Exercise changes: Moderate to vigorous-intensity aerobic activity 150 minutes per week if tolerated. . Lipid-lowering medications: see documented in medical record.   Elevated glucose.  Hemoglobin A1c ordered today.  Other fatigue. Fatigue may be related to obesity, depression or many other causes. Labs will be ordered, and in the meanwhile, Anita Randolph will focus on self care including making healthy food choices, increasing physical activity and focusing on stress reduction. T3, T4, free, TSH ordered today.  Class 1 obesity with serious comorbidity and body mass index (BMI) of 30.0 to 30.9 in adult, unspecified obesity type - BMI greater than 30 at start.  Anita Randolph is currently in the action stage of change. As such, her goal is to continue with weight loss efforts. She has agreed to the Category 2 Plan alternating with the [place].   She will work on meal planning and intentional eating.   Handout was provided on Eating Out.  Exercise goals: Anita Randolph will continue walking for exercise.  Behavioral modification strategies: increasing lean protein intake, decreasing simple carbohydrates, increasing vegetables, increasing water intake, decreasing eating out, no skipping meals, meal planning and cooking strategies, keeping healthy foods in the home and planning for success.  Anita Randolph has agreed to follow-up with our clinic in 2 weeks. She was informed of the importance of frequent follow-up visits to maximize her success with intensive lifestyle modifications for her multiple health conditions.   Anita Randolph was informed we would discuss her lab results  at her next visit unless there is a critical issue that needs to be addressed sooner. Anita Randolph agreed to keep her next visit at the agreed upon time to discuss these results.  Objective:   Blood pressure (!)  136/91, pulse 84, temperature 97.8 F (36.6 C), height 5\' 2"  (1.575 m), weight 156 lb (70.8 kg), last menstrual period 11/28/2004, SpO2 98 %. Body mass index is 28.53 kg/m.  General: Cooperative, alert, well developed, in no acute distress. HEENT: Conjunctivae and lids unremarkable. Cardiovascular: Regular rhythm.  Lungs: Normal work of breathing. Neurologic: No focal deficits.   Lab Results  Component Value Date   CREATININE 1.26 (H) 04/02/2018   BUN 23 04/02/2018   NA 140 04/02/2018   K 4.0 04/02/2018   CL 106 04/02/2018   CO2 27 04/02/2018   Lab Results  Component Value Date   ALT 12 04/02/2018   AST 19 04/02/2018   ALKPHOS 57 04/02/2018   BILITOT 0.5 04/02/2018   No results found for: HGBA1C Lab Results  Component Value Date   INSULIN 15.0 02/19/2020   No results found for: TSH No results found for: CHOL, HDL, LDLCALC, LDLDIRECT, TRIG, CHOLHDL Lab Results  Component Value Date   WBC 6.1 04/02/2018   HGB 13.0 04/02/2018   HCT 40.0 04/02/2018   MCV 85.7 04/02/2018   PLT 247 04/02/2018   No results found for: IRON, TIBC, FERRITIN  Obesity Behavioral Intervention Documentation for Insurance:   Approximately 15 minutes were spent on the discussion below.  ASK: We discussed the diagnosis of obesity with Anita Randolph today and Anita Randolph agreed to give [place] permission to discuss obesity behavioral modification therapy today.  ASSESS: Anita Randolph has the diagnosis of obesity and her BMI today is 28.6. Anita Randolph is in the action stage of change.   ADVISE: Anita Randolph was educated on the multiple health risks of obesity as well as the benefit of weight loss to improve her health. She was advised of the need for long term treatment and the importance of lifestyle modifications to improve her current health and to decrease her risk of future health problems.  AGREE: Multiple dietary modification options and treatment options were discussed and Anita Randolph agreed to follow the recommendations documented  in the above note.  ARRANGE: Anita Randolph was educated on the importance of frequent visits to treat obesity as outlined per CMS and USPSTF guidelines and agreed to schedule her next follow up appointment today.  Attestation Statements:   Reviewed by clinician on day of visit: allergies, medications, problem list, medical history, surgical history, family history, social history, and previous encounter notes.  [name], am acting as Location manager for [employer], DO   I have reviewed the above documentation for accuracy and completeness, and I agree with the above. Jearld Lesch, DO

## 2020-05-29 ENCOUNTER — Other Ambulatory Visit: Payer: Self-pay | Admitting: Obstetrics and Gynecology

## 2020-05-29 DIAGNOSIS — Z1231 Encounter for screening mammogram for malignant neoplasm of breast: Secondary | ICD-10-CM

## 2020-05-29 LAB — HEMOGLOBIN A1C
Est. average glucose Bld gHb Est-mCnc: 117 mg/dL
Hgb A1c MFr Bld: 5.7 % — ABNORMAL HIGH (ref 4.8–5.6)

## 2020-05-29 LAB — COMPREHENSIVE METABOLIC PANEL
ALT: 21 IU/L (ref 0–32)
AST: 27 IU/L (ref 0–40)
Albumin/Globulin Ratio: 1.6 (ref 1.2–2.2)
Albumin: 4.4 g/dL (ref 3.8–4.8)
Alkaline Phosphatase: 90 IU/L (ref 48–121)
BUN/Creatinine Ratio: 20 (ref 12–28)
BUN: 23 mg/dL (ref 8–27)
Bilirubin Total: 0.4 mg/dL (ref 0.0–1.2)
CO2: 22 mmol/L (ref 20–29)
Calcium: 9.7 mg/dL (ref 8.7–10.3)
Chloride: 103 mmol/L (ref 96–106)
Creatinine, Ser: 1.13 mg/dL — ABNORMAL HIGH (ref 0.57–1.00)
GFR calc Af Amer: 58 mL/min/{1.73_m2} — ABNORMAL LOW (ref >59)
GFR calc non Af Amer: 50 mL/min/{1.73_m2} — ABNORMAL LOW (ref >59)
Globulin, Total: 2.8 g/dL (ref 1.5–4.5)
Glucose: 88 mg/dL (ref 65–99)
Potassium: 4.1 mmol/L (ref 3.5–5.2)
Sodium: 140 mmol/L (ref 134–144)
Total Protein: 7.2 g/dL (ref 6.0–8.5)

## 2020-05-29 LAB — TSH: TSH: 1.33 u[IU]/mL (ref 0.450–4.500)

## 2020-05-29 LAB — INSULIN, RANDOM: INSULIN: 8.9 u[IU]/mL (ref 2.6–24.9)

## 2020-05-29 LAB — LIPID PANEL WITH LDL/HDL RATIO
Cholesterol, Total: 201 mg/dL — ABNORMAL HIGH (ref 100–199)
HDL: 62 mg/dL (ref >39)
LDL Chol Calc (NIH): 119 mg/dL — ABNORMAL HIGH (ref 0–99)
LDL/HDL Ratio: 1.9 ratio (ref 0.0–3.2)
Triglycerides: 112 mg/dL (ref 0–149)
VLDL Cholesterol Cal: 20 mg/dL (ref 5–40)

## 2020-05-29 LAB — T4, FREE: Free T4: 1.05 ng/dL (ref 0.82–1.77)

## 2020-05-29 LAB — VITAMIN D 25 HYDROXY (VIT D DEFICIENCY, FRACTURES): Vit D, 25-Hydroxy: 50.3 ng/mL (ref 30.0–100.0)

## 2020-05-29 LAB — T3: T3, Total: 102 ng/dL (ref 71–180)

## 2020-06-02 ENCOUNTER — Encounter (INDEPENDENT_AMBULATORY_CARE_PROVIDER_SITE_OTHER): Payer: Self-pay | Admitting: Bariatrics

## 2020-06-02 DIAGNOSIS — R7303 Prediabetes: Secondary | ICD-10-CM | POA: Insufficient documentation

## 2020-06-11 ENCOUNTER — Ambulatory Visit (INDEPENDENT_AMBULATORY_CARE_PROVIDER_SITE_OTHER): Payer: Medicare Other | Admitting: Family Medicine

## 2020-06-11 ENCOUNTER — Encounter (INDEPENDENT_AMBULATORY_CARE_PROVIDER_SITE_OTHER): Payer: Self-pay | Admitting: Family Medicine

## 2020-06-11 ENCOUNTER — Other Ambulatory Visit: Payer: Self-pay

## 2020-06-11 VITALS — BP 107/74 | HR 76 | Temp 97.6°F | Ht 62.0 in | Wt 157.0 lb

## 2020-06-11 DIAGNOSIS — E7849 Other hyperlipidemia: Secondary | ICD-10-CM | POA: Diagnosis not present

## 2020-06-11 DIAGNOSIS — Z6830 Body mass index (BMI) 30.0-30.9, adult: Secondary | ICD-10-CM

## 2020-06-11 DIAGNOSIS — R7303 Prediabetes: Secondary | ICD-10-CM

## 2020-06-11 DIAGNOSIS — E669 Obesity, unspecified: Secondary | ICD-10-CM

## 2020-06-11 DIAGNOSIS — N1831 Chronic kidney disease, stage 3a: Secondary | ICD-10-CM | POA: Diagnosis not present

## 2020-06-17 ENCOUNTER — Encounter (INDEPENDENT_AMBULATORY_CARE_PROVIDER_SITE_OTHER): Payer: Self-pay | Admitting: Family Medicine

## 2020-06-17 DIAGNOSIS — N1831 Chronic kidney disease, stage 3a: Secondary | ICD-10-CM | POA: Insufficient documentation

## 2020-06-17 NOTE — Progress Notes (Signed)
Chief Complaint:   OBESITY Anita Randolph is here to discuss her progress with her obesity treatment plan along with follow-up of her obesity related diagnoses. Anita Randolph is on the Category 2 Plan or the [place] and states she is following her eating plan approximately 65-70% of the time. Anita Randolph states she is walking for 60 minutes 6 times per week.  Today's visit was #: 7 Starting weight: 168 lbs Starting date: 02/19/2020 Today's weight: 157 lbs Today's date: 06/11/2020 Total lbs lost to date: 11 Total lbs lost since last in-office visit: 0  Interim History: Anita Randolph notes she has been somewhat off the plan due to grazing. She is on the plan for meals but then tends to snack too much. She is not weighing her protein.  Subjective:   1. Other hyperlipidemia Anita Randolph has hyperlipidemia and has been trying to improve her cholesterol levels with intensive lifestyle modification including a low saturated fat diet, exercise and weight loss. Last LDL was sat 119 (down from 127 at her primary care physician's office in March 2021). She is not on statin. Last HDL and triglycerides were within normal limits.   Lab Results  Component Value Date   ALT 21 05/28/2020   AST 27 05/28/2020   ALKPHOS 90 05/28/2020   BILITOT 0.4 05/28/2020   Lab Results  Component Value Date   CHOL 201 (H) 05/28/2020   HDL 62 05/28/2020   LDLCALC 119 (H) 05/28/2020   TRIG 112 05/28/2020   2. Pre-diabetes Anita Randolph has a diagnosis of pre-diabetes based on her elevated Hgb A1c and was informed this puts her at greater risk of developing diabetes. Last A1c was down to 5.7 from 5.8 at primary care physician's office. Fasting insulin is much better. She has a strong family history of diabetes mellitus. She continues to work on diet and exercise to decrease her risk of diabetes. She denies nausea or hypoglycemia.  Lab Results  Component Value Date   HGBA1C 5.7 (H) 05/28/2020   Lab Results  Component Value Date   INSULIN 8.9  05/28/2020   INSULIN 15.0 02/19/2020   3. Stage 3a chronic kidney disease Anita Randolph is aware of her CKD. Her GFR is 50 (up from 44).  Assessment/Plan:   1. Other hyperlipidemia Cardiovascular risk and specific lipid/LDL goals reviewed. We discussed several lifestyle modifications today. Anita Randolph will continue her meal plan, and will continue to work on exercise and weight loss efforts.   2. Pre-diabetes Anita Randolph will continue her meal plan, and will continue to work on weight loss, exercise, and decreasing simple carbohydrates to help decrease the risk of diabetes.   3. Stage 3a chronic kidney disease Anita Randolph is to avoid NSAIDS and increase her hydration.  4. Class 1 obesity with serious comorbidity and body mass index (BMI) of 30.0 to 30.9 in adult, unspecified obesity type Anita Randolph is currently in the action stage of change. As such, her goal is to continue with weight loss efforts. She has agreed to the Category 2 Plan.   Anita Randolph was given additional lunch options. She is to count extra calories when snacking.  Exercise goals: As is.  Behavioral modification strategies: decreasing simple carbohydrates and planning for success.  Anita Randolph has agreed to follow-up with our clinic in 2 to 3 weeks. She was informed of the importance of frequent follow-up visits to maximize her success with intensive lifestyle modifications for her multiple health conditions.   Objective:   Blood pressure 107/74, pulse 76, temperature 97.6 F (36.4 C), temperature  source Oral, height 5\' 2"  (1.575 m), weight 157 lb (71.2 kg), last menstrual period 11/28/2004, SpO2 97 %. Body mass index is 28.72 kg/m.  General: Cooperative, alert, well developed, in no acute distress. HEENT: Conjunctivae and lids unremarkable. Cardiovascular: Regular rhythm.  Lungs: Normal work of breathing. Neurologic: No focal deficits.   Lab Results  Component Value Date   CREATININE 1.13 (H) 05/28/2020   BUN 23 05/28/2020   NA 140 05/28/2020     K 4.1 05/28/2020   CL 103 05/28/2020   CO2 22 05/28/2020   Lab Results  Component Value Date   ALT 21 05/28/2020   AST 27 05/28/2020   ALKPHOS 90 05/28/2020   BILITOT 0.4 05/28/2020   Lab Results  Component Value Date   HGBA1C 5.7 (H) 05/28/2020   Lab Results  Component Value Date   INSULIN 8.9 05/28/2020   INSULIN 15.0 02/19/2020   Lab Results  Component Value Date   TSH 1.330 05/28/2020   Lab Results  Component Value Date   CHOL 201 (H) 05/28/2020   HDL 62 05/28/2020   LDLCALC 119 (H) 05/28/2020   TRIG 112 05/28/2020   Lab Results  Component Value Date   WBC 6.1 04/02/2018   HGB 13.0 04/02/2018   HCT 40.0 04/02/2018   MCV 85.7 04/02/2018   PLT 247 04/02/2018   No results found for: IRON, TIBC, FERRITIN  Attestation Statements:   Reviewed by clinician on day of visit: allergies, medications, problem list, medical history, surgical history, family history, social history, and previous encounter notes.   Wilhemena Durie, am acting as Location manager for [employer], FNP-C.  I have reviewed the above documentation for accuracy and completeness, and I agree with the above. -  Georgianne Fick, FNP

## 2020-06-28 ENCOUNTER — Encounter (INDEPENDENT_AMBULATORY_CARE_PROVIDER_SITE_OTHER): Payer: Self-pay | Admitting: Bariatrics

## 2020-06-29 ENCOUNTER — Other Ambulatory Visit: Payer: Self-pay

## 2020-06-29 ENCOUNTER — Ambulatory Visit
Admission: RE | Admit: 2020-06-29 | Discharge: 2020-06-29 | Disposition: A | Discharge status: Home / Self Care | Payer: Medicare Other | Source: Ambulatory Visit | Attending: Obstetrics and Gynecology | Admitting: Obstetrics and Gynecology

## 2020-06-29 DIAGNOSIS — Z1231 Encounter for screening mammogram for malignant neoplasm of breast: Secondary | ICD-10-CM

## 2020-06-30 ENCOUNTER — Ambulatory Visit (INDEPENDENT_AMBULATORY_CARE_PROVIDER_SITE_OTHER): Payer: Medicare Other | Admitting: Bariatrics

## 2020-07-05 ENCOUNTER — Encounter (INDEPENDENT_AMBULATORY_CARE_PROVIDER_SITE_OTHER): Payer: Self-pay | Admitting: Bariatrics

## 2020-07-07 ENCOUNTER — Ambulatory Visit (INDEPENDENT_AMBULATORY_CARE_PROVIDER_SITE_OTHER): Payer: Medicare Other | Admitting: Family Medicine

## 2020-07-08 ENCOUNTER — Ambulatory Visit (INDEPENDENT_AMBULATORY_CARE_PROVIDER_SITE_OTHER): Payer: Medicare Other | Admitting: Adult Health

## 2020-07-13 ENCOUNTER — Ambulatory Visit (INDEPENDENT_AMBULATORY_CARE_PROVIDER_SITE_OTHER): Payer: Medicare Other | Admitting: Adult Health

## 2020-07-13 ENCOUNTER — Encounter (INDEPENDENT_AMBULATORY_CARE_PROVIDER_SITE_OTHER): Payer: Self-pay | Admitting: Adult Health

## 2020-07-13 ENCOUNTER — Other Ambulatory Visit: Payer: Self-pay

## 2020-07-13 VITALS — BP 141/85 | HR 65 | Temp 97.5°F | Ht 62.0 in | Wt 155.0 lb

## 2020-07-13 DIAGNOSIS — R030 Elevated blood-pressure reading, without diagnosis of hypertension: Secondary | ICD-10-CM

## 2020-07-13 DIAGNOSIS — E669 Obesity, unspecified: Secondary | ICD-10-CM | POA: Diagnosis not present

## 2020-07-13 DIAGNOSIS — E7849 Other hyperlipidemia: Secondary | ICD-10-CM

## 2020-07-13 DIAGNOSIS — Z6830 Body mass index (BMI) 30.0-30.9, adult: Secondary | ICD-10-CM | POA: Diagnosis not present

## 2020-07-14 DIAGNOSIS — R030 Elevated blood-pressure reading, without diagnosis of hypertension: Secondary | ICD-10-CM | POA: Insufficient documentation

## 2020-07-14 NOTE — Progress Notes (Signed)
Chief Complaint:   OBESITY Anita Randolph is here to discuss her progress with her obesity treatment plan along with follow-up of her obesity related diagnoses. Anita Randolph is on the Category 2 Plan and states she is following her eating plan approximately 65% of the time. Anita Randolph states she is walking 60 minutes 6 times per week.  Today's visit was #: 8 Starting weight: 168 lbs Starting date: 02/19/2020 Today's weight: 155 lbs Today's date: 07/13/2020 Total lbs lost to date: 13 Total lbs lost since last in-office visit: 2  Interim History: Anita Randolph continues to enjoy the foods and structure of the Category 2 meal plan. She will experience afternoon polyphagia and often chooses a higher protein snack to address hunger. Tomorrow is her birthday and she plans on celebrating at [employer] at lunch. She will fly to [place] the following day for 24 hours of celebration.  Subjective:   Other hyperlipidemia. Addysyn is on simvastatin 40 mg, which was increased from 20 mg in March 2021. She denies myalgias.  Lab Results  Component Value Date   CHOL 201 (H) 05/28/2020   HDL 62 05/28/2020   LDLCALC 119 (H) 05/28/2020   TRIG 112 05/28/2020   Lab Results  Component Value Date   ALT 21 05/28/2020   AST 27 05/28/2020   ALKPHOS 90 05/28/2020   BILITOT 0.4 05/28/2020   The 10-year ASCVD risk score Anita Bussing DC Jr., et al., 2013) is: 8.9%   Values used to calculate the score:     Age: 24 years     Sex: Female     Is Non-Hispanic African American: No     Diabetic: No     Tobacco smoker: No     Systolic Blood Pressure: 622 mmHg     Is BP treated: No     HDL Cholesterol: 62 mg/dL     Total Cholesterol: 201 mg/dL  Elevated blood-pressure reading without diagnosis of hypertension. Blood pressure is slightly elevated today at today's office visit. She had a full cardiac workup last year with negative results. She denies cardiac symptoms.  BP Readings from Last 3 Encounters:  07/13/20 (!) 141/85    06/11/20 107/74  05/28/20 (!) 136/91   Assessment/Plan:   Other hyperlipidemia. Cardiovascular risk and specific lipid/LDL goals reviewed.  We discussed several lifestyle modifications today and [name] will continue to work on diet, exercise and weight loss efforts. Orders and follow up as documented in patient record. She will continue her statin therapy as directed. Labs will be checked every 3 months.  Counseling Intensive lifestyle modifications are the first line treatment for this issue.  Dietary changes: Increase soluble fiber. Decrease simple carbohydrates.  Exercise changes: Moderate to vigorous-intensity aerobic activity 150 minutes per week if tolerated.  Lipid-lowering medications: see documented in medical record.  Elevated blood-pressure reading without diagnosis of hypertension. Anita Randolph will continue to follow the Category 2 meal plan and will monitor her blood pressure.  Class 1 obesity with serious comorbidity and body mass index (BMI) of 30.0 to 30.9 in adult, unspecified obesity type - BMI greater than 30 at the start of the program.  Anita Randolph is currently in the action stage of change. As such, her goal is to continue with weight loss efforts. She has agreed to the Category 2 Plan.   Handout was provided on High Protein/Low Calorie Foods.  Exercise goals: Anita Randolph will continue her current exercise regimen.   Behavioral modification strategies: increasing lean protein intake, meal planning and cooking  strategies, celebration eating strategies and planning for success.  Anita Randolph has agreed to follow-up with our clinic in 2-3 weeks. She was informed of the importance of frequent follow-up visits to maximize her success with intensive lifestyle modifications for her multiple health conditions.   Objective:   Blood pressure (!) 141/85, pulse 65, temperature (!) 97.5 F (36.4 C), temperature source Oral, height 5\' 2"  (1.575 m), weight 155 lb (70.3 kg), last menstrual period  11/28/2004, SpO2 99 %. Body mass index is 28.35 kg/m.  General: Cooperative, alert, well developed, in no acute distress. HEENT: Conjunctivae and lids unremarkable. Cardiovascular: Regular rhythm.  Lungs: Normal work of breathing. Neurologic: No focal deficits.   Lab Results  Component Value Date   CREATININE 1.13 (H) 05/28/2020   BUN 23 05/28/2020   NA 140 05/28/2020   K 4.1 05/28/2020   CL 103 05/28/2020   CO2 22 05/28/2020   Lab Results  Component Value Date   ALT 21 05/28/2020   AST 27 05/28/2020   ALKPHOS 90 05/28/2020   BILITOT 0.4 05/28/2020   Lab Results  Component Value Date   HGBA1C 5.7 (H) 05/28/2020   Lab Results  Component Value Date   INSULIN 8.9 05/28/2020   INSULIN 15.0 02/19/2020   Lab Results  Component Value Date   TSH 1.330 05/28/2020   Lab Results  Component Value Date   CHOL 201 (H) 05/28/2020   HDL 62 05/28/2020   LDLCALC 119 (H) 05/28/2020   TRIG 112 05/28/2020   Lab Results  Component Value Date   WBC 6.1 04/02/2018   HGB 13.0 04/02/2018   HCT 40.0 04/02/2018   MCV 85.7 04/02/2018   PLT 247 04/02/2018   No results found for: IRON, TIBC, FERRITIN  Attestation Statements:   Reviewed by clinician on day of visit: allergies, medications, problem list, medical history, surgical history, family history, social history, and previous encounter notes.  Time spent on visit including pre-visit chart review and post-visit charting and care was 27 minutes.   I, Michaelene Song, am acting as Location manager for [employer], NP-C   I have reviewed the above documentation for accuracy and completeness, and I agree with the above. -  Esaw Grandchild, NP

## 2020-07-22 DIAGNOSIS — M8580 Other specified disorders of bone density and structure, unspecified site: Secondary | ICD-10-CM | POA: Diagnosis not present

## 2020-07-22 DIAGNOSIS — Z01419 Encounter for gynecological examination (general) (routine) without abnormal findings: Secondary | ICD-10-CM | POA: Diagnosis not present

## 2020-07-22 DIAGNOSIS — Z6827 Body mass index (BMI) 27.0-27.9, adult: Secondary | ICD-10-CM | POA: Diagnosis not present

## 2020-07-28 ENCOUNTER — Other Ambulatory Visit: Payer: Self-pay

## 2020-07-28 ENCOUNTER — Ambulatory Visit (INDEPENDENT_AMBULATORY_CARE_PROVIDER_SITE_OTHER): Payer: Medicare Other | Admitting: Adult Health

## 2020-07-28 ENCOUNTER — Encounter (INDEPENDENT_AMBULATORY_CARE_PROVIDER_SITE_OTHER): Payer: Self-pay | Admitting: Adult Health

## 2020-07-28 VITALS — BP 114/80 | HR 88 | Temp 97.9°F | Ht 62.0 in | Wt 155.0 lb

## 2020-07-28 DIAGNOSIS — M8580 Other specified disorders of bone density and structure, unspecified site: Secondary | ICD-10-CM | POA: Diagnosis not present

## 2020-07-28 DIAGNOSIS — Z6830 Body mass index (BMI) 30.0-30.9, adult: Secondary | ICD-10-CM | POA: Diagnosis not present

## 2020-07-28 DIAGNOSIS — E782 Mixed hyperlipidemia: Secondary | ICD-10-CM

## 2020-07-28 DIAGNOSIS — R7303 Prediabetes: Secondary | ICD-10-CM | POA: Diagnosis not present

## 2020-07-28 DIAGNOSIS — E66811 Obesity, class 1: Secondary | ICD-10-CM

## 2020-07-28 DIAGNOSIS — E669 Obesity, unspecified: Secondary | ICD-10-CM

## 2020-07-28 NOTE — Progress Notes (Signed)
Chief Complaint:   OBESITY Anita Randolph is here to discuss her progress with her obesity treatment plan along with follow-up of her obesity related diagnoses. [name] is on the Category 2 Plan and states she is following her eating plan approximately 50% of the time. [name] states she is walking 3-4 miles ~60 minutes 6 times per week.  Today's visit was #: 9 Starting weight: 168 lbs Starting date: 02/19/2020 Today's weight: 155 lbs Today's date: 07/28/2020 Total lbs lost to date: 13 Total lbs lost since last in-office visit: 0  Interim History: [name] states over the last 2 weeks she has enjoyed frequent celebration eating with her birthday and a wedding in [place] last weekend. She stayed on track with PC/[place] and remained well hydrated. This weekend she will travel to [facility] with friends and will stay on plan with protein and vegetables at each meal, maintaining proper hydration, and frequent walking/resistance training.  Subjective:   Prediabetes. Anita Randolph has a diagnosis of prediabetes based on her elevated HgA1c and was informed this puts her at greater risk of developing diabetes. She continues to work on diet and exercise to decrease her risk of diabetes. She denies nausea or hypoglycemia. Labs from 05/28/2020 demonstrated elevated A1c and insulin. Anita Randolph is not on metformin and denies polyphagia.  Lab Results  Component Value Date   HGBA1C 5.7 (H) 05/28/2020   Lab Results  Component Value Date   INSULIN 8.9 05/28/2020   INSULIN 15.0 02/19/2020   Mixed hyperlipidemia. Lipid panel on 05/28/2020 showed total and LDL levels to be elevated. [name] is on simvastatin 40 mg daily and denies myalgias.   Lab Results  Component Value Date   CHOL 201 (H) 05/28/2020   HDL 62 05/28/2020   LDLCALC 119 (H) 05/28/2020   TRIG 112 05/28/2020   Lab Results  Component Value Date   ALT 21 05/28/2020   AST 27 05/28/2020   ALKPHOS 90 05/28/2020   BILITOT 0.4 05/28/2020   The  10-year ASCVD risk score [name], et al., 2013) is: 6%   Values used to calculate the score:     Age: 7 years     Sex: Female     Is Non-Hispanic African American: No     Diabetic: No     Tobacco smoker: No     Systolic Blood Pressure: 301 mmHg     Is BP treated: No     HDL Cholesterol: 62 mg/dL     Total Cholesterol: 201 mg/dL  Osteopenia, unspecified location. Anita Randolph had DEXA and bone density imaging in 2016, which was updated in February, 2020. She is on alendronate 70 mg once weekly, calcium carbonate, and Vitamin D supplementation. Her GYN started her on alendronate last week.  Assessment/Plan:   Prediabetes. [name] will continue to work on weight loss, exercise, and decreasing simple carbohydrates to help decrease the risk of diabetes. She will continue to follow the Category 2 meal plan and labs will be checked every 3 months.   Mixed hyperlipidemia. Cardiovascular risk and specific lipid/LDL goals reviewed.  We discussed several lifestyle modifications today and [name] will continue to work on diet, exercise and weight loss efforts. Orders and follow up as documented in patient record. She will continue statin therapy as directed and will continue to follow the Category 2 meal plan.  Counseling Intensive lifestyle modifications are the first line treatment for this issue. . Dietary changes: Increase soluble fiber. Decrease simple carbohydrates. . Exercise changes: Moderate  to vigorous-intensity aerobic activity 150 minutes per week if tolerated. . Lipid-lowering medications: see documented in medical record.  Osteopenia, unspecified location. Anita Randolph will continue weightbearing exercises and current prescription/supplement plan as directed. She will have bone density repeated in February, 2022.  Class 1 obesity with serious comorbidity and body mass index (BMI) of 30.0 to 30.9 in adult, unspecified obesity type.  [name] is currently in the action stage of change. As such, her  goal is to continue with weight loss efforts. She has agreed to the Category 2 Plan.   Exercise goals: Anita Randolph will continue walking 60 minutes 6 days a week.  Behavioral modification strategies: increasing lean protein intake, meal planning and cooking strategies, travel eating strategies, celebration eating strategies and planning for success.  [name] has agreed to follow-up with our clinic in 2-3 weeks. She was informed of the importance of frequent follow-up visits to maximize her success with intensive lifestyle modifications for her multiple health conditions.   Objective:   Blood pressure 114/80, pulse 88, temperature 97.9 F (36.6 C), temperature source Oral, height 5\' 2"  (1.575 m), weight 155 lb (70.3 kg), last menstrual period 11/28/2004, SpO2 97 %. Body mass index is 28.35 kg/m.  General: Cooperative, alert, well developed, in no acute distress. HEENT: Conjunctivae and lids unremarkable. Cardiovascular: Regular rhythm.  Lungs: Normal work of breathing. Neurologic: No focal deficits.   Lab Results  Component Value Date   CREATININE 1.13 (H) 05/28/2020   BUN 23 05/28/2020   NA 140 05/28/2020   K 4.1 05/28/2020   CL 103 05/28/2020   CO2 22 05/28/2020   Lab Results  Component Value Date   ALT 21 05/28/2020   AST 27 05/28/2020   ALKPHOS 90 05/28/2020   BILITOT 0.4 05/28/2020   Lab Results  Component Value Date   HGBA1C 5.7 (H) 05/28/2020   Lab Results  Component Value Date   INSULIN 8.9 05/28/2020   INSULIN 15.0 02/19/2020   Lab Results  Component Value Date   TSH 1.330 05/28/2020   Lab Results  Component Value Date   CHOL 201 (H) 05/28/2020   HDL 62 05/28/2020   LDLCALC 119 (H) 05/28/2020   TRIG 112 05/28/2020   Lab Results  Component Value Date   WBC 6.1 04/02/2018   HGB 13.0 04/02/2018   HCT 40.0 04/02/2018   MCV 85.7 04/02/2018   PLT 247 04/02/2018   No results found for: IRON, TIBC, FERRITIN  Attestation Statements:   Reviewed by clinician  on day of visit: allergies, medications, problem list, medical history, surgical history, family history, social history, and previous encounter notes.  Time spent on visit including pre-visit chart review and post-visit charting and care was 27 minutes.   I, Michaelene Song, am acting as Location manager for [employer], NP-C   I have reviewed the above documentation for accuracy and completeness, and I agree with the above. -  Esaw Grandchild, NP

## 2020-07-29 DIAGNOSIS — Z20822 Contact with and (suspected) exposure to covid-19: Secondary | ICD-10-CM | POA: Diagnosis not present

## 2020-08-17 DIAGNOSIS — Z23 Encounter for immunization: Secondary | ICD-10-CM | POA: Diagnosis not present

## 2020-08-19 ENCOUNTER — Ambulatory Visit (INDEPENDENT_AMBULATORY_CARE_PROVIDER_SITE_OTHER): Payer: Medicare Other | Admitting: Adult Health

## 2020-08-19 ENCOUNTER — Encounter (INDEPENDENT_AMBULATORY_CARE_PROVIDER_SITE_OTHER): Payer: Self-pay | Admitting: Adult Health

## 2020-08-19 ENCOUNTER — Other Ambulatory Visit: Payer: Self-pay

## 2020-08-19 VITALS — BP 121/83 | HR 95 | Temp 98.0°F | Ht 62.0 in | Wt 154.0 lb

## 2020-08-19 DIAGNOSIS — Z6830 Body mass index (BMI) 30.0-30.9, adult: Secondary | ICD-10-CM

## 2020-08-19 DIAGNOSIS — R7303 Prediabetes: Secondary | ICD-10-CM

## 2020-08-19 DIAGNOSIS — E669 Obesity, unspecified: Secondary | ICD-10-CM

## 2020-08-19 DIAGNOSIS — E782 Mixed hyperlipidemia: Secondary | ICD-10-CM | POA: Diagnosis not present

## 2020-08-20 NOTE — Progress Notes (Signed)
Chief Complaint:   OBESITY ALOMA BOCH is here to discuss her progress with her obesity treatment plan along with follow-up of her obesity related diagnoses. Shelagh is on the Category 2 Plan and states she is following her eating plan approximately 50% of the time. Oriah states she is walking 60 minutes 6 times per week.  Today's visit was #: 10 Starting weight: 168 lbs Starting date: 02/19/2020 Today's weight: 154 lbs Today's date: 08/19/2020 Total lbs lost to date: 14 Total lbs lost since last in-office visit: 1  Interim History: Gaylen continues travel frequently and has family visit often the last several weeks, which is her normal pattern. She really tries to stay on plan as consistently as she can even when traveling. Her ultimate goal is to lose down to 145 lbs/BMI 26.5.  Subjective:   Prediabetes. Clifton has a diagnosis of prediabetes based on her elevated HgA1c and was informed this puts her at greater risk of developing diabetes. She continues to work on diet and exercise to decrease her risk of diabetes. She denies nausea or hypoglycemia. 05/28/2020 blood glucose 88, A1c 5.7, insulin 8.9; previous insulin level 15.0 on 02/19/2020. Leannah is not on metformin.  Lab Results  Component Value Date   HGBA1C 5.7 (H) 05/28/2020   Lab Results  Component Value Date   INSULIN 8.9 05/28/2020   INSULIN 15.0 02/19/2020   Mixed hyperlipidemia. 05/28/2020 total and LDL were above goal. Sherrill is on simvastatin 40 mg every evening. She denies myalgias.   Lab Results  Component Value Date   CHOL 201 (H) 05/28/2020   HDL 62 05/28/2020   LDLCALC 119 (H) 05/28/2020   TRIG 112 05/28/2020   Lab Results  Component Value Date   ALT 21 05/28/2020   AST 27 05/28/2020   ALKPHOS 90 05/28/2020   BILITOT 0.4 05/28/2020   The 10-year ASCVD risk score [name], et al., 2013) is: 6.7%   Values used to calculate the score:     Age: 16 years     Sex: Female     Is Non-Hispanic African  American: No     Diabetic: No     Tobacco smoker: No     Systolic Blood Pressure: 259 mmHg     Is BP treated: No     HDL Cholesterol: 62 mg/dL     Total Cholesterol: 201 mg/dL  Assessment/Plan:   Prediabetes. Dell will continue to work on weight loss, exercise, and decreasing simple carbohydrates to help decrease the risk of diabetes. Labs will be checked at her next office visit.  Mixed hyperlipidemia. Cardiovascular risk and specific lipid/LDL goals reviewed.  We discussed several lifestyle modifications today and Shanyn will continue to work on diet, exercise and weight loss efforts. Orders and follow up as documented in patient record. She will continue statin therapy as directed. Labs will be checked at her next office visit.  Counseling Intensive lifestyle modifications are the first line treatment for this issue. . Dietary changes: Increase soluble fiber. Decrease simple carbohydrates. . Exercise changes: Moderate to vigorous-intensity aerobic activity 150 minutes per week if tolerated. . Lipid-lowering medications: see documented in medical record.  Class 1 obesity with serious comorbidity and body mass index (BMI) of 30.0 to 30.9 in adult, unspecified obesity type - BMI greater than 30 at start of program.  Leontina is currently in the action stage of change. As such, her goal is to continue with weight loss efforts. She has agreed to keeping  a food journal and adhering to recommended goals of 1100-1200 calories and 85 grams of protein daily.   Handouts were provided on Eating Out and Recipe Guide.  Exercise goals: Elana will continue her current exercise regimen.   Behavioral modification strategies: increasing lean protein intake, decreasing eating out, meal planning and cooking strategies, planning for success and keeping a strict food journal.  Jadasia has agreed to follow-up with our clinic fasting in 2-3 weeks. She was informed of the importance of frequent follow-up visits to  maximize her success with intensive lifestyle modifications for her multiple health conditions.   Objective:   Blood pressure 121/83, pulse 95, temperature 98 F (36.7 C), height 5\' 2"  (1.575 m), weight 154 lb (69.9 kg), last menstrual period 11/28/2004, SpO2 98 %. Body mass index is 28.17 kg/m.  General: Cooperative, alert, well developed, in no acute distress. HEENT: Conjunctivae and lids unremarkable. Cardiovascular: Regular rhythm.  Lungs: Normal work of breathing. Neurologic: No focal deficits.   Lab Results  Component Value Date   CREATININE 1.13 (H) 05/28/2020   BUN 23 05/28/2020   NA 140 05/28/2020   K 4.1 05/28/2020   CL 103 05/28/2020   CO2 22 05/28/2020   Lab Results  Component Value Date   ALT 21 05/28/2020   AST 27 05/28/2020   ALKPHOS 90 05/28/2020   BILITOT 0.4 05/28/2020   Lab Results  Component Value Date   HGBA1C 5.7 (H) 05/28/2020   Lab Results  Component Value Date   INSULIN 8.9 05/28/2020   INSULIN 15.0 02/19/2020   Lab Results  Component Value Date   TSH 1.330 05/28/2020   Lab Results  Component Value Date   CHOL 201 (H) 05/28/2020   HDL 62 05/28/2020   LDLCALC 119 (H) 05/28/2020   TRIG 112 05/28/2020   Lab Results  Component Value Date   WBC 6.1 04/02/2018   HGB 13.0 04/02/2018   HCT 40.0 04/02/2018   MCV 85.7 04/02/2018   PLT 247 04/02/2018   No results found for: IRON, TIBC, FERRITIN  Attestation Statements:   Reviewed by clinician on day of visit: allergies, medications, problem list, medical history, surgical history, family history, social history, and previous encounter notes.  Time spent on visit including pre-visit chart review and post-visit charting and care was 25 minutes.   I, Michaelene Song, am acting as Location manager for [employer], NP-C   I have reviewed the above documentation for accuracy and completeness, and I agree with the above. -  Esaw Grandchild, NP

## 2020-09-02 ENCOUNTER — Other Ambulatory Visit: Payer: Self-pay

## 2020-09-02 ENCOUNTER — Ambulatory Visit (INDEPENDENT_AMBULATORY_CARE_PROVIDER_SITE_OTHER): Payer: Medicare Other | Admitting: Adult Health

## 2020-09-02 ENCOUNTER — Encounter (INDEPENDENT_AMBULATORY_CARE_PROVIDER_SITE_OTHER): Payer: Self-pay | Admitting: Adult Health

## 2020-09-02 VITALS — BP 123/68 | HR 72 | Temp 97.7°F | Ht 62.0 in | Wt 155.0 lb

## 2020-09-02 DIAGNOSIS — E782 Mixed hyperlipidemia: Secondary | ICD-10-CM | POA: Diagnosis not present

## 2020-09-02 DIAGNOSIS — E669 Obesity, unspecified: Secondary | ICD-10-CM

## 2020-09-02 DIAGNOSIS — R7303 Prediabetes: Secondary | ICD-10-CM | POA: Diagnosis not present

## 2020-09-02 DIAGNOSIS — Z6830 Body mass index (BMI) 30.0-30.9, adult: Secondary | ICD-10-CM | POA: Diagnosis not present

## 2020-09-02 DIAGNOSIS — N1831 Chronic kidney disease, stage 3a: Secondary | ICD-10-CM | POA: Diagnosis not present

## 2020-09-02 NOTE — Progress Notes (Signed)
Chief Complaint:   OBESITY Anita Randolph is here to discuss her progress with her obesity treatment plan along with follow-up of her obesity related diagnoses. Anita Randolph is on the Category 2 Plan and states she is following her eating plan approximately 70% of the time. Anita Randolph states she is walking 60 minutes 5 times per week.  Today's visit was #: 11 Starting weight: 168 lbs Starting date: 02/19/2020 Today's weight: 155 lbs Today's date: 09/02/2020 Total lbs lost to date: 13 Total lbs lost since last in-office visit: 0  Interim History: Anita Randolph has continued to travel and/or visit with family/friends on a frequent basis. She and her husband will be traveling up the [facility] for greater than 1 week. She plans on bringing healthy foods/snacks during the road trip to stay on track.  Subjective:   Mixed hyperlipidemia. 05/28/2020 total cholesterol 201 with an LDL of 119, slightly above goal. Anita Randolph is on simvastatin 40 mg daily. She denies myalgias.   Lab Results  Component Value Date   CHOL 201 (H) 05/28/2020   HDL 62 05/28/2020   LDLCALC 119 (H) 05/28/2020   TRIG 112 05/28/2020   Lab Results  Component Value Date   ALT 21 05/28/2020   AST 27 05/28/2020   ALKPHOS 90 05/28/2020   BILITOT 0.4 05/28/2020   The 10-year ASCVD risk score Anita Bussing DC Jr., Anita al., Anita Randolph) is: 6.9%   Values used to calculate the score:     Age: 69 years     Sex: Female     Is Non-Hispanic African American: No     Diabetic: No     Tobacco smoker: No     Systolic Blood Pressure: 027 mmHg     Is BP treated: No     HDL Cholesterol: 62 mg/dL     Total Cholesterol: 201 mg/dL  Prediabetes. Anita Randolph has a diagnosis of prediabetes based on her elevated HgA1c and was informed this puts her at greater risk of developing diabetes. She continues to work on diet and exercise to decrease her risk of diabetes. She denies nausea or hypoglycemia. 05/28/2020 blood glucose 88, A1c 5.7 with an insulin level of 8.9, which is  improved from 15.0 on 02/19/2020. Anita Randolph is not on metformin.  Lab Results  Component Value Date   HGBA1C 5.7 (H) 05/28/2020   Lab Results  Component Value Date   INSULIN 8.9 05/28/2020   INSULIN 15.0 02/19/2020   Stage 3a chronic kidney disease (HCC) - BMI greater than 30 at the start of program. Blood pressure is well controlled without ACE or ARB.  Assessment/Plan:   Mixed hyperlipidemia. Cardiovascular risk and specific lipid/LDL goals reviewed.  We discussed several lifestyle modifications today and Anita Randolph will continue to work on diet, exercise and weight loss efforts. Orders and follow up as documented in patient record. She will remain well hydrated. Labs will be checked at her next office visit.  Counseling Intensive lifestyle modifications are the first line treatment for this issue.  Dietary changes: Increase soluble fiber. Decrease simple carbohydrates.  Exercise changes: Moderate to vigorous-intensity aerobic activity 150 minutes per week if tolerated.  Lipid-lowering medications: see documented in medical record.  Prediabetes. Anita Randolph will continue to work on weight loss, exercise, and decreasing simple carbohydrates to help decrease the risk of diabetes. Labs will be checked at her next office visit.  Stage 3a chronic kidney disease (HCC) - BMI greater than 30 at the start of program. Labs will be checked at her next  office visit.  Class 1 obesity with serious comorbidity and body mass index (BMI) of 30.0 to 30.9 in adult, unspecified obesity type.  Anita Randolph is currently in the action stage of change. As such, her goal is to continue with weight loss efforts. She has agreed to keeping a food journal and adhering to recommended goals of 1100-1200 calories and 85 grams of protein daily.   Labs will be checked at her next office visit.  Exercise goals: Anita Randolph will continue her current exercise regimen.   Behavioral modification strategies: increasing lean protein intake,  decreasing simple carbohydrates, no skipping meals, meal planning and cooking strategies, better snacking choices, planning for success and keeping a strict food journal.  Anita Randolph has agreed to follow-up with our clinic fasting in 2 weeks. She was informed of the importance of frequent follow-up visits to maximize her success with intensive lifestyle modifications for her multiple health conditions.   Objective:   Blood pressure 123/68, pulse 72, temperature 97.7 F (36.5 C), height 5\' 2"  (1.575 m), weight 155 lb (70.3 kg), last menstrual period 11/28/2004, SpO2 97 %. Body mass index is 28.35 kg/m.  General: Cooperative, alert, well developed, in no acute distress. HEENT: Conjunctivae and lids unremarkable. Cardiovascular: Regular rhythm.  Lungs: Normal work of breathing. Neurologic: No focal deficits.   Lab Results  Component Value Date   CREATININE 1.13 (H) 05/28/2020   BUN 23 05/28/2020   NA 140 05/28/2020   K 4.1 05/28/2020   CL 103 05/28/2020   CO2 22 05/28/2020   Lab Results  Component Value Date   ALT 21 05/28/2020   AST 27 05/28/2020   ALKPHOS 90 05/28/2020   BILITOT 0.4 05/28/2020   Lab Results  Component Value Date   HGBA1C 5.7 (H) 05/28/2020   Lab Results  Component Value Date   INSULIN 8.9 05/28/2020   INSULIN 15.0 02/19/2020   Lab Results  Component Value Date   TSH 1.330 05/28/2020   Lab Results  Component Value Date   CHOL 201 (H) 05/28/2020   HDL 62 05/28/2020   LDLCALC 119 (H) 05/28/2020   TRIG 112 05/28/2020   Lab Results  Component Value Date   WBC 6.1 04/02/2018   HGB 13.0 04/02/2018   HCT 40.0 04/02/2018   MCV 85.7 04/02/2018   PLT 247 04/02/2018   No results found for: IRON, TIBC, FERRITIN  Attestation Statements:   Reviewed by clinician on day of visit: allergies, medications, problem list, medical history, surgical history, family history, social history, and previous encounter notes.  Time spent on visit including pre-visit  chart review and post-visit charting and care was 32 minutes.   I, Michaelene Song, am acting as Location manager for [employer], NP-C   I have reviewed the above documentation for accuracy and completeness, and I agree with the above. -  Esaw Grandchild, NP

## 2020-09-05 ENCOUNTER — Encounter (INDEPENDENT_AMBULATORY_CARE_PROVIDER_SITE_OTHER): Payer: Self-pay | Admitting: Adult Health

## 2020-09-07 NOTE — Telephone Encounter (Signed)
[email or web address]

## 2020-09-17 ENCOUNTER — Ambulatory Visit (INDEPENDENT_AMBULATORY_CARE_PROVIDER_SITE_OTHER): Payer: Medicare Other | Admitting: Adult Health

## 2020-09-17 ENCOUNTER — Encounter (INDEPENDENT_AMBULATORY_CARE_PROVIDER_SITE_OTHER): Payer: Self-pay | Admitting: Adult Health

## 2020-09-17 ENCOUNTER — Other Ambulatory Visit: Payer: Self-pay

## 2020-09-17 VITALS — BP 96/65 | HR 70 | Temp 98.2°F | Ht 62.0 in | Wt 152.0 lb

## 2020-09-17 DIAGNOSIS — E669 Obesity, unspecified: Secondary | ICD-10-CM

## 2020-09-17 DIAGNOSIS — E782 Mixed hyperlipidemia: Secondary | ICD-10-CM | POA: Diagnosis not present

## 2020-09-17 DIAGNOSIS — E7849 Other hyperlipidemia: Secondary | ICD-10-CM

## 2020-09-17 DIAGNOSIS — R7303 Prediabetes: Secondary | ICD-10-CM | POA: Diagnosis not present

## 2020-09-17 DIAGNOSIS — Z6830 Body mass index (BMI) 30.0-30.9, adult: Secondary | ICD-10-CM

## 2020-09-17 DIAGNOSIS — N1831 Chronic kidney disease, stage 3a: Secondary | ICD-10-CM | POA: Diagnosis not present

## 2020-09-17 DIAGNOSIS — E66811 Obesity, class 1: Secondary | ICD-10-CM

## 2020-09-18 LAB — COMPREHENSIVE METABOLIC PANEL
ALT: 17 IU/L (ref 0–32)
AST: 20 IU/L (ref 0–40)
Albumin/Globulin Ratio: 1.4 (ref 1.2–2.2)
Albumin: 4.2 g/dL (ref 3.8–4.8)
Alkaline Phosphatase: 78 IU/L (ref 44–121)
BUN/Creatinine Ratio: 21 (ref 12–28)
BUN: 25 mg/dL (ref 8–27)
Bilirubin Total: 0.6 mg/dL (ref 0.0–1.2)
CO2: 23 mmol/L (ref 20–29)
Calcium: 9.9 mg/dL (ref 8.7–10.3)
Chloride: 103 mmol/L (ref 96–106)
Creatinine, Ser: 1.2 mg/dL — ABNORMAL HIGH (ref 0.57–1.00)
GFR calc Af Amer: 54 mL/min/{1.73_m2} — ABNORMAL LOW (ref >59)
GFR calc non Af Amer: 47 mL/min/{1.73_m2} — ABNORMAL LOW (ref >59)
Globulin, Total: 2.9 g/dL (ref 1.5–4.5)
Glucose: 88 mg/dL (ref 65–99)
Potassium: 4.5 mmol/L (ref 3.5–5.2)
Sodium: 139 mmol/L (ref 134–144)
Total Protein: 7.1 g/dL (ref 6.0–8.5)

## 2020-09-18 LAB — LIPID PANEL
Chol/HDL Ratio: 3.1 ratio (ref 0.0–4.4)
Cholesterol, Total: 208 mg/dL — ABNORMAL HIGH (ref 100–199)
HDL: 67 mg/dL (ref >39)
LDL Chol Calc (NIH): 126 mg/dL — ABNORMAL HIGH (ref 0–99)
Triglycerides: 83 mg/dL (ref 0–149)
VLDL Cholesterol Cal: 15 mg/dL (ref 5–40)

## 2020-09-18 LAB — HEMOGLOBIN A1C
Est. average glucose Bld gHb Est-mCnc: 114 mg/dL
Hgb A1c MFr Bld: 5.6 % (ref 4.8–5.6)

## 2020-09-18 LAB — INSULIN, RANDOM: INSULIN: 8.4 u[IU]/mL (ref 2.6–24.9)

## 2020-09-21 NOTE — Progress Notes (Signed)
Chief Complaint:   OBESITY MAKINSEY Randolph is here to discuss her progress with her obesity treatment plan along with follow-up of her obesity related diagnoses. Anita Randolph is keeping a food journal and adhering to recommended goals of 1100-1200 calories and 85+ grams of protein and states she is following her eating plan approximately 50% of the time. Anita Randolph states she is walking 120 minutes 7 times per week.  Today's visit was #: 12 Starting weight: 168 lbs Starting date: 02/19/2020 Today's weight: 152 lbs Today's date: 09/17/2020 Total lbs lost to date: 16 Total lbs lost since last in-office visit: 3  Interim History: Anita Randolph and her husband traveled up the [place] to visit friends/family and she stayed on plan with lean protein, portion control, and frequent walking. She continues to enjoy the flexibility of the journaling plan.  Subjective:   Mixed hyperlipidemia. Last lipid panel revealed total and LDL cholesterol levels slightly above goal. Anita Randolph is on simvastatin 40 mg daily.  Prediabetes. Anita Randolph has a diagnosis of prediabetes based on her elevated HgA1c and was informed this puts her at greater risk of developing diabetes. She continues to work on diet and exercise to decrease her risk of diabetes. She denies nausea or hypoglycemia. 05/28/2020 A1c 5.7. Anita Randolph is not on metformin and denies polyphagia. She has a family history of T2D.  Lab Results  Component Value Date   INSULIN 8.9 05/28/2020   INSULIN 15.0 02/19/2020   Stage 3a chronic kidney disease ([place]). GFR was less than 60 on several CMP results in Epic.  Assessment/Plan:   Mixed hyperlipidemia. Cardiovascular risk and specific lipid/LDL goals reviewed.  We discussed several lifestyle modifications today and Nan will continue to work on diet, exercise and weight loss efforts. Orders and follow up as documented in patient record. She will continue statin therapy as directed. Labs will be checked  today.  Counseling Intensive lifestyle modifications are the first line treatment for this issue.  Dietary changes: Increase soluble fiber. Decrease simple carbohydrates.  Exercise changes: Moderate to vigorous-intensity aerobic activity 150 minutes per week if tolerated.  Lipid-lowering medications: see documented in medical record.  Prediabetes. Anita Randolph will continue to work on weight loss, exercise, and decreasing simple carbohydrates to help decrease the risk of diabetes. Labs will be checked today.  Stage 3a chronic kidney disease ([place]). Labs will be checked today.  Class 1 obesity with serious comorbidity and body mass index (BMI) of 30.0 to 30.9 in adult, unspecified obesity type - BMI greater than 30 at start of program.  Anita Randolph is currently in the action stage of change. As such, her goal is to continue with weight loss efforts. She has agreed to keeping a food journal and adhering to recommended goals of 1100-1200 calories and 85 grams of protein daily.   Exercise goals: Anita Randolph will continue walking 120 minutes 7 times per week.  Behavioral modification strategies: increasing lean protein intake, meal planning and cooking strategies and planning for success.  Anita Randolph has agreed to follow-up with our clinic in 2 weeks. She was informed of the importance of frequent follow-up visits to maximize her success with intensive lifestyle modifications for her multiple health conditions.   Anita Randolph was informed we would discuss her lab results at her next visit unless there is a critical issue that needs to be addressed sooner. Anita Randolph agreed to keep her next visit at the agreed upon time to discuss these results.  Objective:   Blood pressure 96/65, pulse 70, temperature 98.2 F (  36.8 C), height 5\' 2"  (1.575 m), weight 152 lb (68.9 kg), last menstrual period 11/28/2004, SpO2 98 %. Body mass index is 27.8 kg/m.  General: Cooperative, alert, well developed, in no acute distress. HEENT:  Conjunctivae and lids unremarkable. Cardiovascular: Regular rhythm.  Lungs: Normal work of breathing. Neurologic: No focal deficits.   Lab Results  Component Value Date   HGBA1C 5.7 (H) 05/28/2020   Lab Results  Component Value Date   INSULIN 8.9 05/28/2020   INSULIN 15.0 02/19/2020   Lab Results  Component Value Date   TSH 1.330 05/28/2020   Lab Results  Component Value Date   WBC 6.1 04/02/2018   HGB 13.0 04/02/2018   HCT 40.0 04/02/2018   MCV 85.7 04/02/2018   PLT 247 04/02/2018   No results found for: IRON, TIBC, FERRITIN  Obesity Behavioral Intervention:   Approximately 15 minutes were spent on the discussion below.  ASK: We discussed the diagnosis of obesity with Anita Randolph today and Anita Randolph agreed to give [place] permission to discuss obesity behavioral modification therapy today.  ASSESS: Anita Randolph has the diagnosis of obesity and her BMI today is 27.9. Anita Randolph is in the action stage of change.   ADVISE: Anita Randolph was educated on the multiple health risks of obesity as well as the benefit of weight loss to improve her health. She was advised of the need for long term treatment and the importance of lifestyle modifications to improve her current health and to decrease her risk of future health problems.  AGREE: Multiple dietary modification options and treatment options were discussed and Anita Randolph agreed to follow the recommendations documented in the above note.  ARRANGE: Anita Randolph was educated on the importance of frequent visits to treat obesity as outlined per CMS and USPSTF guidelines and agreed to schedule her next follow up appointment today.  Attestation Statements:   Reviewed by clinician on day of visit: allergies, medications, problem list, medical history, surgical history, family history, social history, and previous encounter notes.  I, Anita Randolph, am acting as Location manager for [employer], NP-C   I have reviewed the above documentation for accuracy and  completeness, and I agree with the above. -  Anita Randolph d. Manuel Lawhead, NP-C

## 2020-09-25 ENCOUNTER — Other Ambulatory Visit: Payer: Self-pay | Admitting: Oncology

## 2020-09-30 ENCOUNTER — Other Ambulatory Visit: Payer: Self-pay

## 2020-09-30 ENCOUNTER — Ambulatory Visit (INDEPENDENT_AMBULATORY_CARE_PROVIDER_SITE_OTHER): Payer: Medicare Other | Admitting: Adult Health

## 2020-09-30 ENCOUNTER — Encounter (INDEPENDENT_AMBULATORY_CARE_PROVIDER_SITE_OTHER): Payer: Self-pay | Admitting: Adult Health

## 2020-09-30 VITALS — BP 119/74 | HR 75 | Temp 98.4°F | Ht 62.0 in | Wt 155.0 lb

## 2020-09-30 DIAGNOSIS — Z6830 Body mass index (BMI) 30.0-30.9, adult: Secondary | ICD-10-CM | POA: Diagnosis not present

## 2020-09-30 DIAGNOSIS — E669 Obesity, unspecified: Secondary | ICD-10-CM

## 2020-09-30 DIAGNOSIS — R7303 Prediabetes: Secondary | ICD-10-CM | POA: Diagnosis not present

## 2020-09-30 DIAGNOSIS — E782 Mixed hyperlipidemia: Secondary | ICD-10-CM

## 2020-09-30 DIAGNOSIS — N189 Chronic kidney disease, unspecified: Secondary | ICD-10-CM | POA: Diagnosis not present

## 2020-09-30 MED ORDER — ATORVASTATIN CALCIUM 40 MG PO TABS: 40.0000 mg | ORAL_TABLET | Freq: Every day | ORAL | 0 refills | Status: DC

## 2020-09-30 NOTE — Progress Notes (Signed)
Chief Complaint:   Anita Randolph is here to discuss her progress with her Anita treatment plan along with follow-up of her Anita related diagnoses. Anita Randolph is keeping a food journal and adhering to recommended goals of 1100-1200 calories and 85 grams of protein and states she is following her eating plan approximately 65-70% of the time. Anita Randolph states she is walking 60 minutes 7 times per week.  Today's visit was #: 75 Starting weight: 168 lbs Starting date: 02/19/2020 Today's weight: 155 lbs Today's date: 09/30/2020 Total lbs lost to date: 13 Total lbs lost since last in-office visit: 0  Interim History: When Anita Randolph reviewed her labs in Epic, she was discouraged and she "went off the rails." She has gotten back on track the last 3 days and is ready to refocus. She will be home the next 2 weeks.  Subjective:   Mixed hyperlipidemia. 09/17/2020 total and LDL levels have both worsened and are still above goal. ASCVD risk is 6.4%. Anita Randolph has been on simvastatin 40 mg,  increased from 20 mg by Cardiology on 12/2019.   Lab Results  Component Value Date   CHOL 208 (H) 09/17/2020   HDL 67 09/17/2020   LDLCALC 126 (H) 09/17/2020   TRIG 83 09/17/2020   CHOLHDL 3.1 09/17/2020   Lab Results  Component Value Date   ALT 17 09/17/2020   AST 20 09/17/2020   ALKPHOS 78 09/17/2020   BILITOT 0.6 09/17/2020   The 10-year ASCVD risk score Anita Bussing DC Jr., Anita al., Anita Randolph) is: 6.4%   Values used to calculate the score:     Age: 69 years     Sex: Female     Is Non-Hispanic African American: No     Diabetic: No     Tobacco smoker: No     Systolic Blood Pressure: 706 mmHg     Is BP treated: No     HDL Cholesterol: 67 mg/dL     Total Cholesterol: 208 mg/dL  Prediabetes. Anita Randolph has a diagnosis of prediabetes based on her elevated HgA1c and was informed this puts her at greater risk of developing diabetes. She continues to work on diet and exercise to decrease her risk of diabetes. She denies  nausea or hypoglycemia. 09/17/2020 A1c 5.6, improved from 5.7 on 05/28/2020; insulin level 8.4, also improved from 8.9 on 05/28/2020; blood glucose level within normal limits.  Lab Results  Component Value Date   HGBA1C 5.6 09/17/2020   Lab Results  Component Value Date   INSULIN 8.4 09/17/2020   INSULIN 8.9 05/28/2020   INSULIN 15.0 02/19/2020   Chronic kidney disease, unspecified CKD stage. 09/17/2020 GFR was 47, stable but did slightly worsen.  Assessment/Plan:   Mixed hyperlipidemia. Cardiovascular risk and specific lipid/LDL goals reviewed.  We discussed several lifestyle modifications today and Anita Randolph will continue to work on diet, exercise and weight loss efforts. Orders and follow up as documented in patient record. Anita Randolph was instructed to stop simvastatin and start atorvastatin 40 mg daily #90 with 0 refills. CMP will be checked in 6 weeks and lipids in 3 months.  Counseling Intensive lifestyle modifications are the first line treatment for this issue. . Dietary changes: Increase soluble fiber. Decrease simple carbohydrates. . Exercise changes: Moderate to vigorous-intensity aerobic activity 150 minutes per week if tolerated. . Lipid-lowering medications: see documented in medical record.  Prediabetes. Anita Randolph will continue to work on weight loss, exercise, and decreasing simple carbohydrates to help decrease the risk of diabetes. Labs will  be checked every 3 months.   Chronic kidney disease, unspecified CKD stage. CMP will be checked every 3 months.   Class 1 Anita with serious comorbidity and body mass index (BMI) of 30.0 to 30.9 in adult, unspecified Anita type - BMI greater than 30 at start of program.  Anita Randolph is currently in the action stage of change. As such, her goal is to continue with weight loss efforts. She has agreed to keeping a food journal and adhering to recommended goals of 1100-1200 calories and 85 grams of protein daily.   Exercise goals: Ayonna will  continue walking 60 minutes 7 times per week.  Behavioral modification strategies: increasing lean protein intake, decreasing simple carbohydrates, no skipping meals, meal planning and cooking strategies, planning for success and keeping a strict food journal.  Anita Randolph has agreed to follow-up with our clinic in 2 weeks. She was informed of the importance of frequent follow-up visits to maximize her success with intensive lifestyle modifications for her multiple health conditions.   Objective:   Blood pressure 119/74, pulse 75, temperature 98.4 F (36.9 C), height 5\' 2"  (1.575 m), weight 155 lb (70.3 kg), last menstrual period 11/28/2004, SpO2 100 %. Body mass index is 28.35 kg/m.  General: Cooperative, alert, well developed, in no acute distress. HEENT: Conjunctivae and lids unremarkable. Cardiovascular: Regular rhythm.  Lungs: Normal work of breathing. Neurologic: No focal deficits.   Lab Results  Component Value Date   CREATININE 1.20 (H) 09/17/2020   BUN 25 09/17/2020   NA 139 09/17/2020   K 4.5 09/17/2020   CL 103 09/17/2020   CO2 23 09/17/2020   Lab Results  Component Value Date   ALT 17 09/17/2020   AST 20 09/17/2020   ALKPHOS 78 09/17/2020   BILITOT 0.6 09/17/2020   Lab Results  Component Value Date   HGBA1C 5.6 09/17/2020   HGBA1C 5.7 (H) 05/28/2020   Lab Results  Component Value Date   INSULIN 8.4 09/17/2020   INSULIN 8.9 05/28/2020   INSULIN 15.0 02/19/2020   Lab Results  Component Value Date   TSH 1.330 05/28/2020   Lab Results  Component Value Date   CHOL 208 (H) 09/17/2020   HDL 67 09/17/2020   LDLCALC 126 (H) 09/17/2020   TRIG 83 09/17/2020   CHOLHDL 3.1 09/17/2020   Lab Results  Component Value Date   WBC 6.1 04/02/2018   HGB 13.0 04/02/2018   HCT 40.0 04/02/2018   MCV 85.7 04/02/2018   PLT 247 04/02/2018   No results found for: IRON, TIBC, FERRITIN  Anita Behavioral Intervention:   Approximately 15 minutes were spent on the  discussion below.  ASK: We discussed the diagnosis of Anita with Anita Randolph today and Anita Randolph agreed to give [place] permission to discuss Anita behavioral modification therapy today.  ASSESS: Anita Randolph has the diagnosis of Anita and her BMI today is 28.4. Anita Randolph is in the action stage of change.   ADVISE: Anita Randolph was educated on the multiple health risks of Anita as well as the benefit of weight loss to improve her health. She was advised of the need for long term treatment and the importance of lifestyle modifications to improve her current health and to decrease her risk of future health problems.  AGREE: Multiple dietary modification options and treatment options were discussed and Anita Randolph agreed to follow the recommendations documented in the above note.  ARRANGE: Anita Randolph was educated on the importance of frequent visits to treat Anita as outlined per CMS and USPSTF guidelines and  agreed to schedule her next follow up appointment today.  Attestation Statements:   Reviewed by clinician on day of visit: allergies, medications, problem list, medical history, surgical history, family history, social history, and previous encounter notes.  I, Michaelene Song, am acting as Location manager for [employer], NP-C   I have reviewed the above documentation for accuracy and completeness, and I agree with the above. - Sharlett Lienemann d. [place], NP-C

## 2020-10-08 DIAGNOSIS — R109 Unspecified abdominal pain: Secondary | ICD-10-CM | POA: Diagnosis not present

## 2020-10-08 DIAGNOSIS — R197 Diarrhea, unspecified: Secondary | ICD-10-CM | POA: Diagnosis not present

## 2020-10-10 ENCOUNTER — Emergency Department (HOSPITAL_COMMUNITY)
Admission: EM | Admit: 2020-10-10 | Discharge: 2020-10-10 | Disposition: A | Discharge status: Home / Self Care | Payer: Medicare Other | Attending: Emergency Medicine | Admitting: Emergency Medicine

## 2020-10-10 ENCOUNTER — Other Ambulatory Visit: Payer: Self-pay

## 2020-10-10 ENCOUNTER — Emergency Department (HOSPITAL_COMMUNITY): Payer: Medicare Other

## 2020-10-10 ENCOUNTER — Encounter (HOSPITAL_COMMUNITY): Payer: Self-pay

## 2020-10-10 DIAGNOSIS — Z79899 Other long term (current) drug therapy: Secondary | ICD-10-CM | POA: Diagnosis not present

## 2020-10-10 DIAGNOSIS — Z853 Personal history of malignant neoplasm of breast: Secondary | ICD-10-CM | POA: Diagnosis not present

## 2020-10-10 DIAGNOSIS — Z87891 Personal history of nicotine dependence: Secondary | ICD-10-CM | POA: Diagnosis not present

## 2020-10-10 DIAGNOSIS — I129 Hypertensive chronic kidney disease with stage 1 through stage 4 chronic kidney disease, or unspecified chronic kidney disease: Secondary | ICD-10-CM | POA: Insufficient documentation

## 2020-10-10 DIAGNOSIS — R109 Unspecified abdominal pain: Secondary | ICD-10-CM | POA: Insufficient documentation

## 2020-10-10 DIAGNOSIS — R7303 Prediabetes: Secondary | ICD-10-CM | POA: Diagnosis not present

## 2020-10-10 DIAGNOSIS — N1831 Chronic kidney disease, stage 3a: Secondary | ICD-10-CM | POA: Diagnosis not present

## 2020-10-10 LAB — URINALYSIS, ROUTINE W REFLEX MICROSCOPIC
Bacteria, UA: NONE SEEN
Bilirubin Urine: NEGATIVE
Glucose, UA: NEGATIVE mg/dL
Ketones, ur: 20 mg/dL — AB
Nitrite: NEGATIVE
Protein, ur: NEGATIVE mg/dL
Specific Gravity, Urine: 1.008 (ref 1.005–1.030)
pH: 5 (ref 5.0–8.0)

## 2020-10-10 LAB — COMPREHENSIVE METABOLIC PANEL
ALT: 15 U/L (ref 0–44)
AST: 19 U/L (ref 15–41)
Albumin: 3.9 g/dL (ref 3.5–5.0)
Alkaline Phosphatase: 57 U/L (ref 38–126)
Anion gap: 10 (ref 5–15)
BUN: 15 mg/dL (ref 8–23)
CO2: 21 mmol/L — ABNORMAL LOW (ref 22–32)
Calcium: 8.4 mg/dL — ABNORMAL LOW (ref 8.9–10.3)
Chloride: 106 mmol/L (ref 98–111)
Creatinine, Ser: 1.38 mg/dL — ABNORMAL HIGH (ref 0.44–1.00)
GFR, Estimated: 42 mL/min — ABNORMAL LOW (ref >60)
Glucose, Bld: 109 mg/dL — ABNORMAL HIGH (ref 70–99)
Potassium: 3.6 mmol/L (ref 3.5–5.1)
Sodium: 137 mmol/L (ref 135–145)
Total Bilirubin: 0.9 mg/dL (ref 0.3–1.2)
Total Protein: 7.6 g/dL (ref 6.5–8.1)

## 2020-10-10 LAB — CBC
HCT: 44.8 % (ref 36.0–46.0)
Hemoglobin: 14.8 g/dL (ref 12.0–15.0)
MCH: 27.8 pg (ref 26.0–34.0)
MCHC: 33 g/dL (ref 30.0–36.0)
MCV: 84.2 fL (ref 80.0–100.0)
Platelets: 274 10*3/uL (ref 150–400)
RBC: 5.32 MIL/uL — ABNORMAL HIGH (ref 3.87–5.11)
RDW: 14.9 % (ref 11.5–15.5)
WBC: 8.5 10*3/uL (ref 4.0–10.5)
nRBC: 0 % (ref 0.0–0.2)

## 2020-10-10 LAB — LIPASE, BLOOD: Lipase: 22 U/L (ref 11–51)

## 2020-10-10 LAB — POC OCCULT BLOOD, ED: Fecal Occult Bld: NEGATIVE

## 2020-10-10 MED ORDER — SODIUM CHLORIDE 0.9 % IV BOLUS
1000.0000 mL | Freq: Once | INTRAVENOUS | Status: AC
  Administered 2020-10-10: 1000 mL via INTRAVENOUS

## 2020-10-10 MED ORDER — ONDANSETRON 4 MG PO TBDP: 4.0000 mg | ORAL_TABLET | Freq: Three times a day (TID) | ORAL | 0 refills | Status: DC | PRN

## 2020-10-10 MED ORDER — IOHEXOL 300 MG/ML  SOLN
80.0000 mL | Freq: Once | INTRAMUSCULAR | Status: AC | PRN
  Administered 2020-10-10: 75 mL via INTRAVENOUS

## 2020-10-10 MED ORDER — ONDANSETRON HCL 4 MG/2ML IJ SOLN
4.0000 mg | Freq: Once | INTRAMUSCULAR | Status: AC
  Administered 2020-10-10: 4 mg via INTRAVENOUS
  Filled 2020-10-10: qty 2

## 2020-10-10 NOTE — ED Provider Notes (Signed)
[facility] DEPT Provider Note   CSN: [id number] Arrival date & time: 10/10/20  8101     History Chief Complaint  Patient presents with  . Abdominal Pain    Anita Randolph is a 69 y.o. female with past medical history significant for anemia, breast cancer currently in remission, fibroids, GERD, hypertension, hyperlipidemia, microscopic hematuria, prediabetes.  No abdominal surgical history. Not anticoagulated.  HPI Patient presents to emergency room today with chief complaint of progressively worsening abdominal pain x4 days.  Patient states the pain started near her umbilicus however over the last 2 days starting to radiate throughout entire abdomen.  She states her pain is intermittent.  At its worst she rates the pain 7 out of 10 in severity.  She describes the pain as an aching sensation.  She has had 10 episodes of diarrhea in the last 24 hours.  She describes stool as being yellow or brown in color.  Last night she noticed there was bright red blood in the stool.  She has tried taking Imodium without symptom improvement.  She endorses nausea without emesis.  She admits to decreased p.o. intake secondary to nausea.  Unsure if symptoms are exacerbated by food.  She did a work-up with her PCP day of symptom onset who recommended she go to the ER if pain worsened or developed fever.  She denies any fever, chills, shortness of breath, cough, chest pain, back pain, pelvic pain, urinary frequency, dysuria, black tarry stool.  Admits to drinking 1 to 2 glasses of wine per week.  Denies any drug use or recent NSAID use.   Had a colonoscopy in 2020 years that had benign polyps. Sees GI at [facility] Dr. Earlean Shawl.    Past Medical History:  Diagnosis Date  . Anemia    s/p chemotherapy  . Breast cancer ([place]) 11/15/12   Right invasive ductal ca  . 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 Fibroid 2007   largest 5x5x5cm  . GERD (gastroesophageal reflux  disease)    only during chemotherapy  . Hyperlipemia   . Hypertension   . Macular hole of right eye   . Microscopic hematuria    negative w/u  . Neuromuscular disorder (H[facility])    tingling hands and feet from chemo-getting better  . Osteopenia due to cancer therapy   . Personal history of chemotherapy   . Personal history of radiation therapy   . Pneumonia   . Pre-diabetes   . Radiation 06/06/13-07/22/13   Right breast  . Rhegmatogenous retinal detachment of right eye     Patient Active Problem List   Diagnosis Date Noted  . Elevated blood-pressure reading without diagnosis of hypertension 07/14/2020  . Stage 3a chronic kidney disease ([place]) 06/17/2020  . Prediabetes 06/02/2020  . Class 1 obesity with serious comorbidity and body mass index (BMI) of 30.0 to 30.9 in adult 02/19/2020  . History of adenomatous polyp of colon 11/10/2019  . Hyperlipidemia 11/10/2019  . SOB (shortness of breath) 11/07/2019  . Rhegmatogenous retinal detachment of right eye 03/06/2018  . Retinal detachment with multiple breaks 03/01/2018  . Macular hole, right eye 02/20/2018  . Hematuria 11/13/2015  . Osteopenia 12/23/2013  . Malignant neoplasm of lower-outer quadrant of right breast of female, estrogen receptor positive ([place]) 08/14/2013  . Edema, lower extremity 06/25/2013  . GERD (gastroesophageal reflux disease) 01/02/2013    Past Surgical History:  Procedure Laterality Date  . 25 GAUGE PARS PLANA VITRECTOMY WITH 20  GAUGE MVR PORT FOR MACULAR HOLE Right 02/20/2018  . 25 GAUGE PARS PLANA VITRECTOMY WITH 20 GAUGE MVR PORT FOR MACULAR HOLE Right 02/20/2018   Procedure: 25 GAUGE PARS PLANA VITRECTOMY WITH 20 GAUGE MVR PORT WITH SERUM PACTCH FOR MACULAR HOLE, LASER, C3F8 GAS INJECTION RIGHT EYE;  Surgeon: Hayden Pedro, MD;  Location: [facility];  Service: Ophthalmology;  Laterality: Right;  . ARM SKIN LESION BIOPSY / EXCISION  2011   nodular faciitis lt arm  . BREAST LUMPECTOMY    . BREAST LUMPECTOMY WITH  NEEDLE LOCALIZATION AND AXILLARY SENTINEL LYMPH NODE BX Right 05/07/2013   Procedure: NEEDLE LOCALIZATION RIGHT BREAST LUMPECTOMY AND   SENTINEL LYMPH NODE  ;  Surgeon: Haywood Lasso, MD;  Location: [facility];  Service: General;  Laterality: Right;  . BREAST SURGERY    . CATARACT EXTRACTION    . COLONOSCOPY    . GAS INSERTION Right 03/06/2018   Procedure: INSERTION OF GAS;  Surgeon: Hayden Pedro, MD;  Location: [facility];  Service: Ophthalmology;  Laterality: Right;  . GAS/FLUID EXCHANGE Right 02/20/2018   Procedure: GAS/FLUID EXCHANGE;  Surgeon: Hayden Pedro, MD;  Location: [facility];  Service: Ophthalmology;  Laterality: Right;  . LASER PHOTO ABLATION Right 03/06/2018   Procedure: LASER PHOTO ABLATION;  Surgeon: Hayden Pedro, MD;  Location: [facility];  Service: Ophthalmology;  Laterality: Right;  . LIPOMA EXCISION     -right back(age 64)  . MEMBRANE PEEL Right 02/20/2018   Procedure: MEMBRANE PEEL;  Surgeon: Hayden Pedro, MD;  Location: [facility];  Service: Ophthalmology;  Laterality: Right;  . MEMBRANE PEEL Right 03/06/2018   Procedure: MEMBRANE PEEL;  Surgeon: Hayden Pedro, MD;  Location: [facility];  Service: Ophthalmology;  Laterality: Right;  . PORTACATH PLACEMENT  12/20/2012   Procedure: INSERTION PORT-A-CATH;  Surgeon: Haywood Lasso, MD;  Location: [facility];  Service: General;  Laterality: N/A;  . SCLERAL BUCKLE WITH POSSIBLE 25 GAUGE PARS PLANA VITRECTOMY Right 03/06/2018   Procedure: SCLERAL BUCKLE WITH 25 GAUGE PARS PLANA VITRECTOMY PERFLUORON INJECTION AND REMOVAL;  Surgeon: Hayden Pedro, MD;  Location: [facility];  Service: Ophthalmology;  Laterality: Right;  . TONSILLECTOMY       OB History    Gravida  2   Para  2   Term  2   Preterm      AB      Living  2     SAB      TAB      Ectopic      Multiple      Live Births              Family History  Problem Relation Age of Onset  . Breast cancer Mother 6  . Osteoporosis Mother   . Diabetes  Mother   . Thyroid disease Mother   . Hypertension Mother   . Heart disease Father 79  . Bladder Cancer Father 22  . High Cholesterol Father   . Breast cancer Maternal Aunt        diagnosed in her 42s  . Multiple sclerosis Paternal Aunt   . Heart disease Maternal Grandmother   . Diabetes Maternal Grandmother   . Mental retardation Cousin        maternal cousins; brothers, sons of aunt with breast cancer    Social History   Tobacco Use  . Smoking status: Former Smoker    Packs/day: 1.00    Years: 4.00  Pack years: 4.00    Types: Cigarettes    Quit date: 11/30/1974    Years since quitting: 45.8  . Smokeless tobacco: Never Used  Vaping Use  . Vaping Use: Never used  Substance Use Topics  . Alcohol use: Yes    Alcohol/week: 2.0 standard drinks    Types: 2 Standard drinks or equivalent per week    Comment: 1-2 glasses of wine per week  . Drug use: No    Home Medications Prior to Admission medications   Medication Sig Start Date End Date Taking? Authorizing Provider  alendronate (FOSAMAX) 70 MG tablet Take 70 mg by mouth once a week. 07/22/20   [provider]  atorvastatin (LIPITOR) 40 MG tablet Take 1 tablet (40 mg total) by mouth daily. 09/30/20   Danford, Valetta Fuller D, NP  Calcium Carbonate-Vitamin D 600-400 MG-UNIT tablet Take 1 tablet by mouth 2 (two) times daily.     [provider]  [employer] D3 25 MCG (1000 UT) capsule TAKE 1 CAPSULE BY MOUTH EVERY DAY 09/25/20   Magrinat, Virgie Dad, MD  ondansetron (ZOFRAN ODT) 4 MG disintegrating tablet Take 1 tablet (4 mg total) by mouth every 8 (eight) hours as needed for nausea or vomiting. 10/10/20   Barrie Folk, PA-C    Allergies    Augmentin [amoxicillin-pot clavulanate] and Sulfa antibiotics  Review of Systems   Review of Systems All other systems are reviewed and are negative for acute change except as noted in the HPI.  Physical Exam Updated Vital Signs BP 128/88 (BP Location: Left Arm)   Pulse (!)  116   Temp 98.3 F (36.8 C) (Oral)   Resp 16   Ht 5\' 3"  (1.6 m)   Wt 69.4 kg   LMP 11/28/2004   SpO2 95%   BMI 27.10 kg/m   Physical Exam Vitals and nursing note reviewed.  Constitutional:      General: She is not in acute distress.    Appearance: She is not ill-appearing.  HENT:     Head: Normocephalic and atraumatic.     Right Ear: Tympanic membrane and external ear normal.     Left Ear: Tympanic membrane and external ear normal.     Nose: Nose normal.     Mouth/Throat:     Mouth: Mucous membranes are moist.     Pharynx: Oropharynx is clear.  Eyes:     General: No scleral icterus.       Right eye: No discharge.        Left eye: No discharge.     Extraocular Movements: Extraocular movements intact.     Conjunctiva/sclera: Conjunctivae normal.     Pupils: Pupils are equal, round, and reactive to light.  Neck:     Vascular: No JVD.  Cardiovascular:     Rate and Rhythm: Normal rate and regular rhythm.     Pulses: Normal pulses.          Radial pulses are 2+ on the right side and 2+ on the left side.     Heart sounds: Normal heart sounds.  Pulmonary:     Comments: Lungs clear to auscultation in all fields. Symmetric chest rise. No wheezing, rales, or rhonchi. Abdominal:     General: Bowel sounds are normal.     Tenderness: There is abdominal tenderness. There is no right CVA tenderness or left CVA tenderness.     Comments: Abdomen is soft, non-distended. Tender to palpation of periumbilical area. No rigidity, no guarding. No peritoneal  signs.  Genitourinary:    Comments: Chaperone Amanda RN present for exam. Digital Rectal Exam reveals sphincter with good tone. No external hemorrhoids. No masses or fissures. Stool color is brown with no overt blood. No gross melena.  Musculoskeletal:        General: Normal range of motion.     Cervical back: Normal range of motion.  Skin:    General: Skin is warm and dry.     Capillary Refill: Capillary refill takes less than 2  seconds.  Neurological:     Mental Status: She is oriented to person, place, and time.     GCS: GCS eye subscore is 4. GCS verbal subscore is 5. GCS motor subscore is 6.     Comments: Fluent speech, no facial droop.  Psychiatric:        Behavior: Behavior normal.     ED Results / Procedures / Treatments   Labs (all labs ordered are listed, but only abnormal results are displayed) Labs Reviewed  COMPREHENSIVE METABOLIC PANEL - Abnormal; Notable for the following components:      Result Value   CO2 21 (*)    Glucose, Bld 109 (*)    Creatinine, Ser 1.38 (*)    Calcium 8.4 (*)    GFR, Estimated 42 (*)    All other components within normal limits  CBC - Abnormal; Notable for the following components:   RBC 5.32 (*)    All other components within normal limits  URINALYSIS, ROUTINE W REFLEX MICROSCOPIC - Abnormal; Notable for the following components:   Hgb urine dipstick LARGE (*)    Ketones, ur 20 (*)    Leukocytes,Ua TRACE (*)    All other components within normal limits  GASTROINTESTINAL PANEL BY PCR, STOOL (REPLACES STOOL CULTURE)  C DIFFICILE QUICK SCREEN W PCR REFLEX  LIPASE, BLOOD  POC OCCULT BLOOD, ED    EKG None  Radiology CT ABDOMEN PELVIS W CONTRAST  Result Date: 10/10/2020 CLINICAL DATA:  Abdominal pain EXAM: CT ABDOMEN AND PELVIS WITH CONTRAST TECHNIQUE: Multidetector CT imaging of the abdomen and pelvis was performed using the standard protocol following bolus administration of intravenous contrast. CONTRAST:  58mL OMNIPAQUE IOHEXOL 300 MG/ML  SOLN COMPARISON:  PET-CT December 06, 2012. FINDINGS: Lower chest: There is slight atelectasis in the anterior right base. Lung bases otherwise are clear. Note that there is apparent postoperative change in the inferomedial right breast. Hepatobiliary: No focal liver lesions are appreciable. Gallbladder wall is not appreciably thickened. There is no biliary duct dilatation. Pancreas: There is no evident pancreatic mass or  inflammatory focus. Spleen: No splenic lesions are evident. Adrenals/Urinary Tract: Adrenals bilaterally appear normal. There is a cyst in the anterior upper pole left kidney region measuring 1.5 x 1.4 cm. There is a 4 mm apparent cyst in the mid right kidney anteriorly. There is no appreciable hydronephrosis on either side. There is no appreciable renal or ureteral calculus on either side. Urinary bladder is midline with wall thickness within normal limits. Stomach/Bowel: Colon is largely collapsed. Areas of apparent wall thickening are likely due to the collapsed colon state. A degree of superimposed nonspecific colitis involving the right and transverse colon regions is question given the overall appearance. There is no small bowel wall thickening or gastric wall thickening. No evident bowel obstruction. The terminal ileum appears normal. There is no evident free air or portal venous air. Vascular/Lymphatic: There is no abdominal aortic aneurysm. There are scattered foci of aortic atherosclerosis. Major venous structures appear  patent. There is no evident adenopathy or abscess in the abdomen or pelvis. There are several subcentimeter right-sided subcentimeter lymph nodes which must be considered nonspecific. Reproductive: The uterus is anteverted. The uterus is somewhat lobulated consistent with leiomyomatous change. A focal leiomyoma along the anterior rightward aspect of the uterus measures 3.9 x 3.1 cm. No adnexal region masses are evident. Other: The appendix appears normal. No evident abscess or ascites in the abdomen or pelvis. There is mild fat in the umbilicus. Musculoskeletal: There is degenerative change in the lower thoracic and lumbar regions. No blastic or lytic bone lesions. Degenerative changes also noted in the pubic symphysis region. No intramuscular lesions are evident. IMPRESSION: 1. Suspect a degree of nonspecific colitis involving much of the right and transverse colon regions. Note that  there is relative decompression of the colon which may contribute to relative wall thickening in these areas. Stomach and small bowel appear normal on this study. No evident bowel obstruction. 2. Several subcentimeter right abdominal mesenteric lymph nodes are noted. These lymph nodes may have reactive etiology due to suspected degree of colitis. In the appropriate clinical setting, these lymph nodes potentially could represent a degree of mesenteric adenitis. No adenopathy by size criteria is evident in the abdomen or pelvis. 3.  No abscess in the abdomen or pelvis.  Appendix appears normal. 4. No evident renal or ureteral calculus. No hydronephrosis. Urinary bladder wall thickness is normal. 5.  Leiomyomatous uterus. 6.  Aortic Atherosclerosis (ICD10-I70.0). 7. Apparent scarring and postoperative change in the inferomedial right breast. Electronically Signed   By: Lowella Grip III M.D.   On: 10/10/2020 11:27    Procedures Procedures (including critical care time)  Medications Ordered in ED Medications  sodium chloride 0.9 % bolus 1,000 mL (0 mLs Intravenous Stopped 10/10/20 1029)  iohexol (OMNIPAQUE) 300 MG/ML solution 80 mL (75 mLs Intravenous Contrast Given 10/10/20 1052)  ondansetron (ZOFRAN) injection 4 mg (4 mg Intravenous Given 10/10/20 1224)    ED Course  I have reviewed the triage vital signs and the nursing notes.  Pertinent labs & imaging results that were available during my care of the patient were reviewed by me and considered in my medical decision making (see chart for details).    MDM Rules/Calculators/A&P                          History provided by patient with additional history obtained from chart review.    Patient presents to the ED with complaints of abdominal pain. Patient nontoxic appearing, in no apparent distress.  Patient noted to be tachycardic in triage to 116 however during exam heart rate is in the 80s.  On exam patient tender to periumbilical area, no  peritoneal signs. Will evaluate with labs and CTAP because of tenderness on exam.  IV fluids and antiemetic ordered.  Patient denies need for analgesics.  Rectal exam performed with chaperone present.  No external hemorrhoids seen, no gross melena.  Fecal occult negative.  CBC shows no leukocytosis, no anemia.  CMP with slightly elevated creatinine at 1.38, this is compared to 3 weeks ago when it was 1.2. LFTs and lipase WNL.  UA shows no signs of infection, she does have ketones which is just dehydration.  She also has large hemoglobinuria which she has a history of and has been worked up for that without any diagnosis. PCR stool testing ordered, patient unable to provide sample here for orders canceled.  Imaging viewed  by me shows possible colitis without an obstruction.  Radiologist also comments on several subcentimeter right abdominal mesenteric lymph nodes are noted could be reactive related to the colitis.  On repeat abdominal exam patient remains without peritoneal signs, doubt cholecystitis, pancreatitis, diverticulitis, appendicitis, bowel obstruction/perforation. Patient tolerating PO in the emergency department. Will discharge home with supportive measures. I discussed results, treatment plan, need for PCP follow-up in 2 days for symptom recheck, and return precautions with the patient. Provided opportunity for questions, patient confirmed understanding and is in agreement with plan. The patient was discussed with and seen by Dr. Eulis Foster who agrees with the treatment plan.   Portions of this note were generated with [employer]. Dictation errors may occur despite best attempts at proofreading.   Final Clinical Impression(s) / ED Diagnoses Final diagnoses:  Abdominal pain, unspecified abdominal location    Rx / DC Orders ED Discharge Orders         Ordered    ondansetron (ZOFRAN ODT) 4 MG disintegrating tablet  Every 8 hours PRN        10/10/20 1213             Barrie Folk, PA-C 10/10/20 1357    Daleen Bo, MD 10/11/20 1506

## 2020-10-10 NOTE — Discharge Instructions (Addendum)
CT scan shows you have what looks to be colitis.  This can be caused by an viral infection or bacteria.  I have included information about colitis in your discharge paperwork so you can read more about it.  At this time we think it is caused by a virus.  Antibiotics are necessary.  He can continue to take Tylenol, ibuprofen and Imodium as needed.  Take as directed on the box.   Prescription for Zofran was sent to your pharmacy.  This is for nausea.  Take if needed.  You should eat a bland diet until your pain improves.  Follow-up with primary care provider in 2 days for symptom recheck.  Return to the emergency department for any new or worsening symptoms.  Thank you for allowing [place] to care for you today.

## 2020-10-10 NOTE — ED Notes (Signed)
Patient aware she needs a stool sample sent if possible

## 2020-10-10 NOTE — ED Notes (Signed)
Patient made aware we need urine sample

## 2020-10-10 NOTE — ED Triage Notes (Signed)
Patient reports abd pain centralized starting 4 days ago. Pain rated 7/10. Patient reports diarrhea and bright red blood in stool this morning. Denies vomiting.

## 2020-10-10 NOTE — ED Notes (Signed)
Husband to bedside

## 2020-10-10 NOTE — ED Provider Notes (Signed)
  Face-to-face evaluation   History: She presents for evaluation of abdominal pain associated with diarrhea and blood in stool.  She states that she feels better since arrival into the ED, and for help 1:30 PM.  She had a colonoscopy several years ago, as routine follow-up for polyps and no other abnormalities were encountered.  Patient feels like she can go home.  Physical exam: Alert elderly female, who is comfortable.  No respiratory distress.  She is lucid.  MDM: Nonspecific colitis, stable for outpatient management.   Medical screening examination/treatment/procedure(s) were conducted as a shared visit with non-physician practitioner(s) and myself.  I personally evaluated the patient during the encounter    Daleen Bo, MD 10/11/20 1506

## 2020-10-10 NOTE — ED Notes (Signed)
Pt given ginger ale.

## 2020-10-10 NOTE — ED Notes (Signed)
Patient to restroom.

## 2020-10-12 ENCOUNTER — Encounter (INDEPENDENT_AMBULATORY_CARE_PROVIDER_SITE_OTHER): Payer: Self-pay | Admitting: Adult Health

## 2020-10-15 ENCOUNTER — Ambulatory Visit (INDEPENDENT_AMBULATORY_CARE_PROVIDER_SITE_OTHER): Payer: Medicare Other | Admitting: Adult Health

## 2020-10-19 ENCOUNTER — Encounter (INDEPENDENT_AMBULATORY_CARE_PROVIDER_SITE_OTHER): Payer: Self-pay | Admitting: Adult Health

## 2020-10-25 IMAGING — MG DIGITAL SCREENING BILAT W/ TOMO W/ CAD
6 of 10 series · 6 of 30 positions shown · non-contrast
Comparison: Previous exam(s).

CLINICAL DATA: Screening.

EXAM:
DIGITAL SCREENING BILATERAL MAMMOGRAM WITH TOMO AND CAD

[L MLO synth-2D]
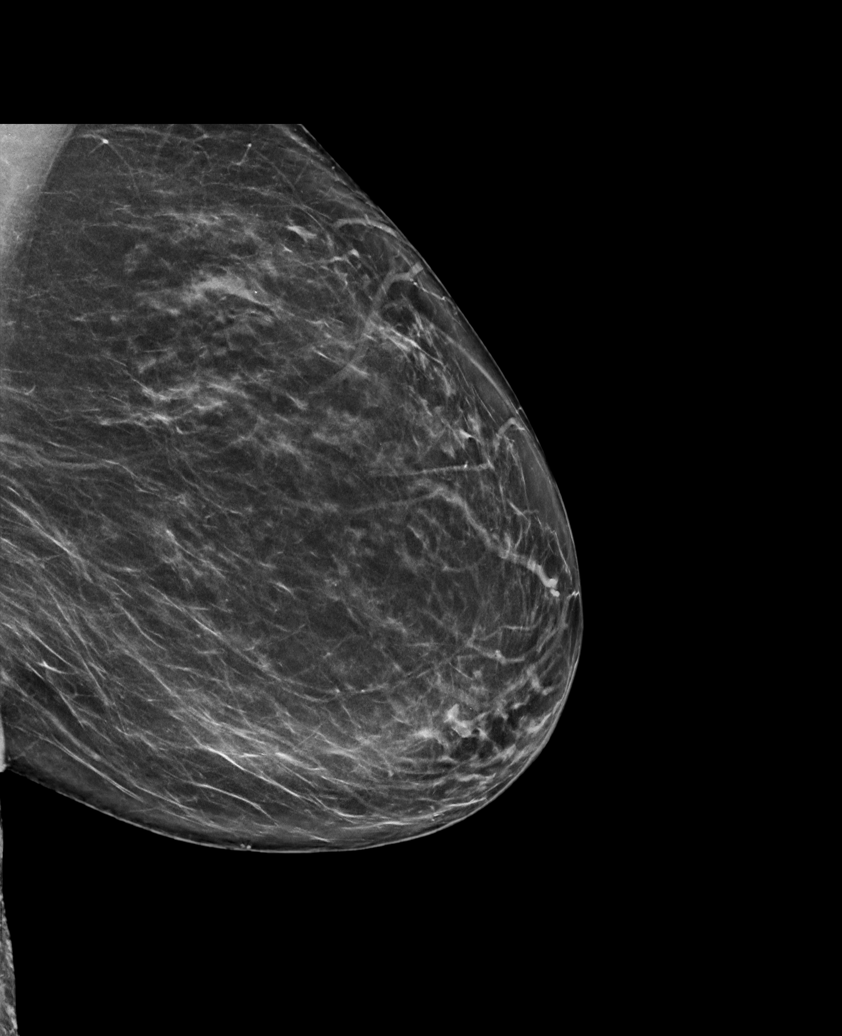

[R CC synth-2D (1 of 2)]
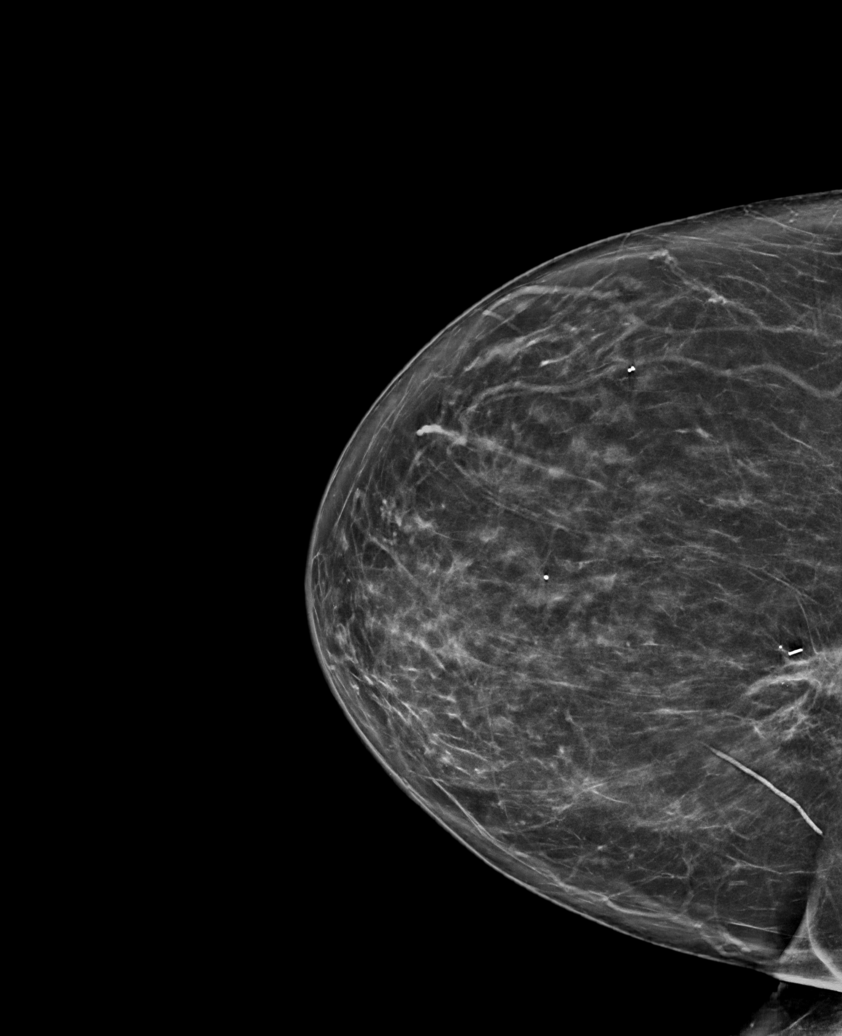

[L CC synth-2D]
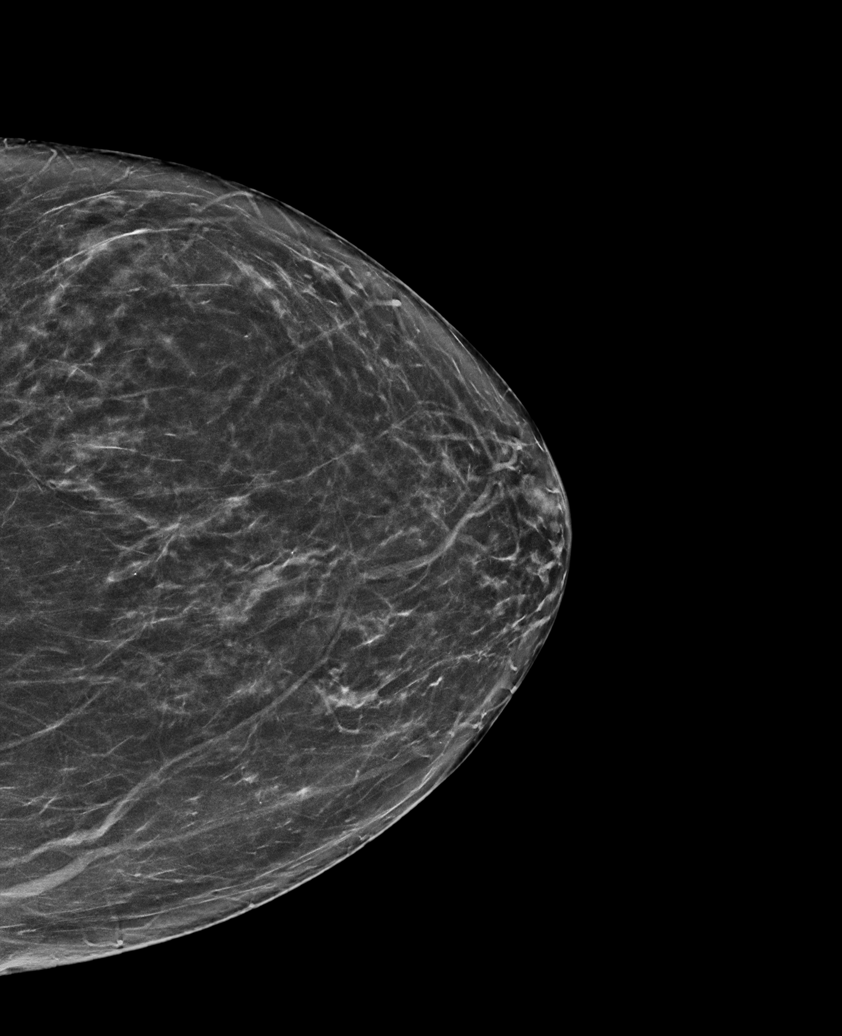

[R MLO synth-2D]
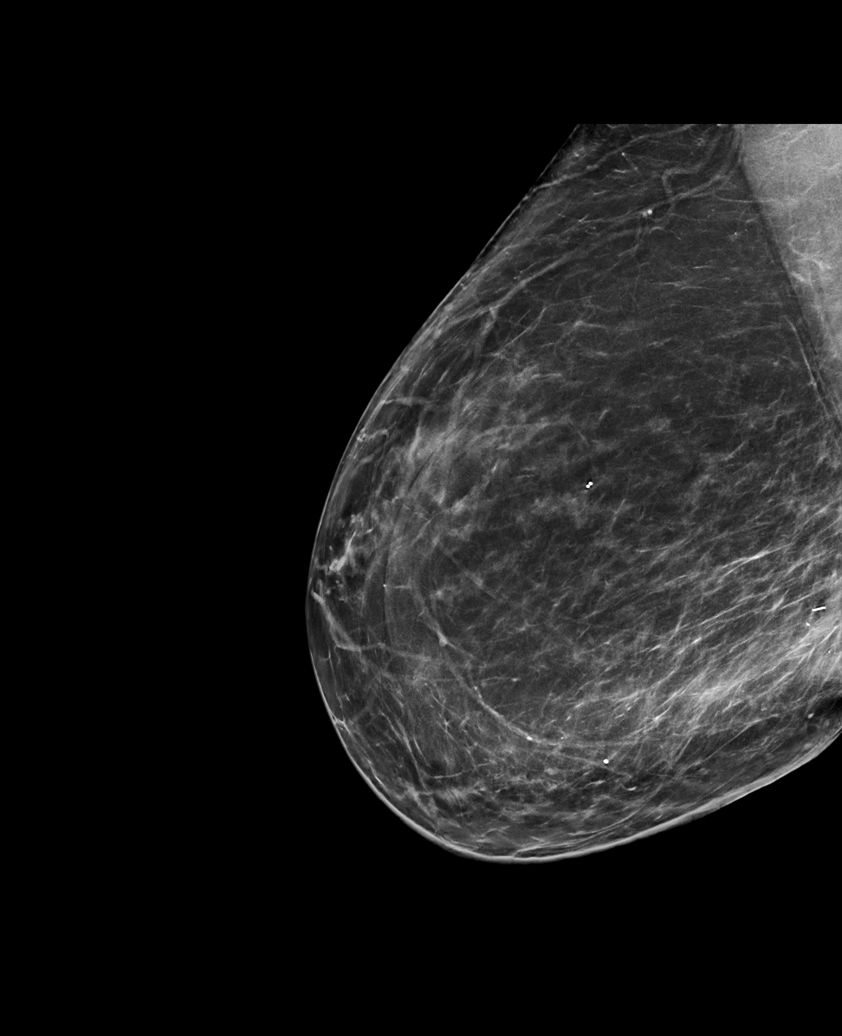

[R CC synth-2D (2 of 2)]
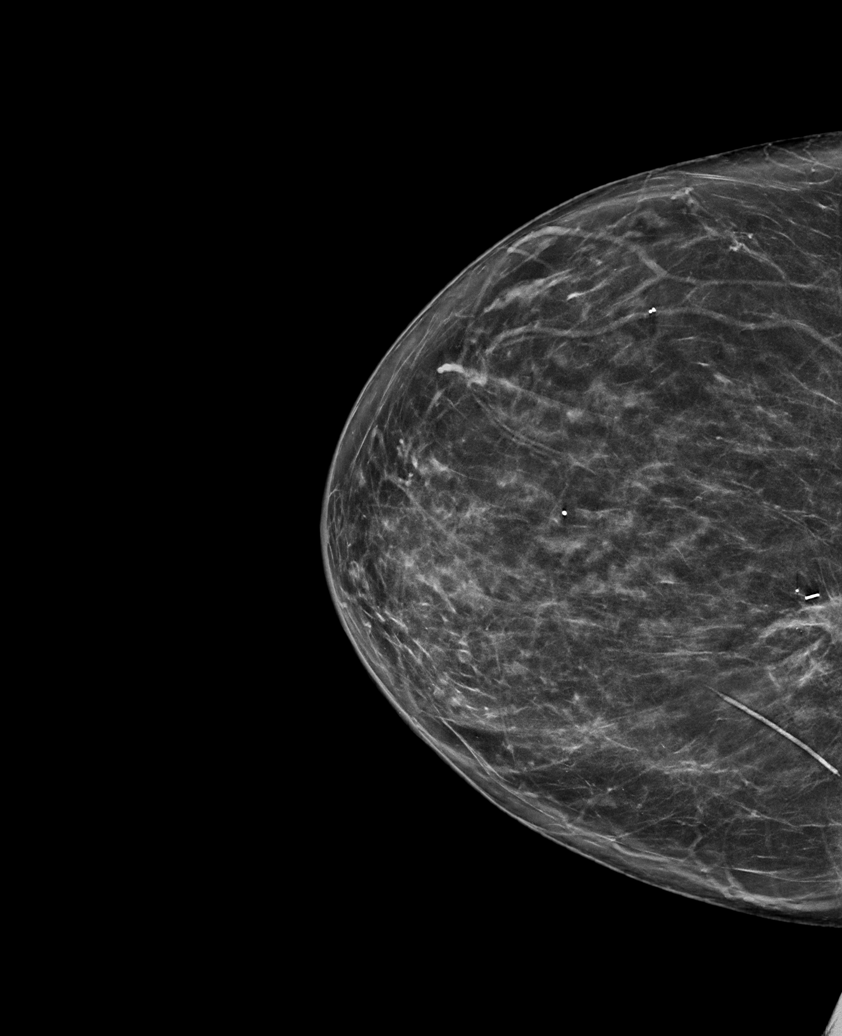

[R CC tomo · tomo slice 35/69.0]
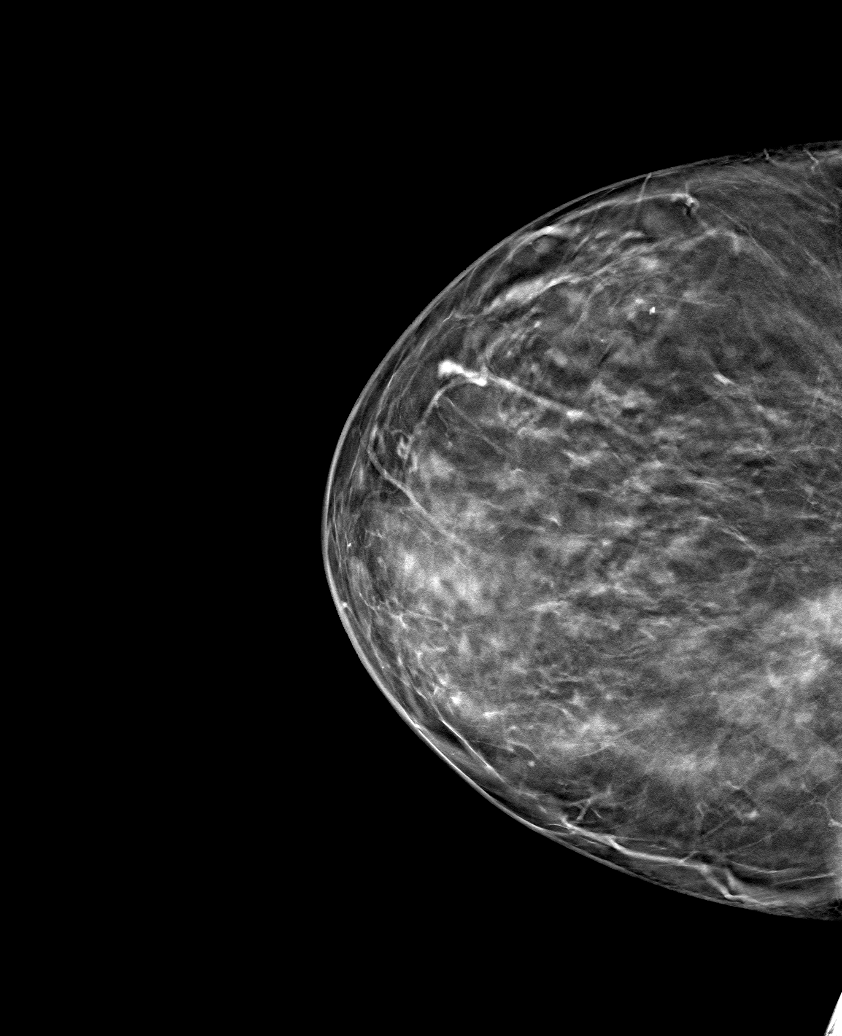

[6 of 30 positions shown; findings below may reference images not displayed]

ACR Breast Density Category b: There are scattered areas of
fibroglandular density.
FINDINGS: There are no findings suspicious for malignancy. Images were
processed with CAD.
IMPRESSION: No mammographic evidence of malignancy. A result letter of this
screening mammogram will be mailed directly to the patient.

RECOMMENDATION:
Screening mammogram in one year. (Code:[id number])

BI-RADS CATEGORY  1: Negative.

## 2020-11-02 DIAGNOSIS — I700 Atherosclerosis of aorta: Secondary | ICD-10-CM | POA: Diagnosis not present

## 2020-11-02 DIAGNOSIS — K529 Noninfective gastroenteritis and colitis, unspecified: Secondary | ICD-10-CM | POA: Diagnosis not present

## 2020-11-02 DIAGNOSIS — N390 Urinary tract infection, site not specified: Secondary | ICD-10-CM | POA: Diagnosis not present

## 2020-11-02 DIAGNOSIS — R109 Unspecified abdominal pain: Secondary | ICD-10-CM | POA: Diagnosis not present

## 2020-11-02 DIAGNOSIS — R590 Localized enlarged lymph nodes: Secondary | ICD-10-CM | POA: Diagnosis not present

## 2020-11-03 ENCOUNTER — Ambulatory Visit (INDEPENDENT_AMBULATORY_CARE_PROVIDER_SITE_OTHER): Payer: Medicare Other | Admitting: Adult Health

## 2020-11-03 ENCOUNTER — Other Ambulatory Visit: Payer: Self-pay

## 2020-11-03 ENCOUNTER — Encounter (INDEPENDENT_AMBULATORY_CARE_PROVIDER_SITE_OTHER): Payer: Self-pay | Admitting: Adult Health

## 2020-11-03 VITALS — BP 134/83 | HR 79 | Temp 97.3°F | Ht 62.0 in | Wt 152.0 lb

## 2020-11-03 DIAGNOSIS — Z6830 Body mass index (BMI) 30.0-30.9, adult: Secondary | ICD-10-CM | POA: Diagnosis not present

## 2020-11-03 DIAGNOSIS — E669 Obesity, unspecified: Secondary | ICD-10-CM | POA: Diagnosis not present

## 2020-11-03 DIAGNOSIS — K529 Noninfective gastroenteritis and colitis, unspecified: Secondary | ICD-10-CM | POA: Diagnosis not present

## 2020-11-03 NOTE — Progress Notes (Signed)
Chief Complaint:   OBESITY Anita Randolph is here to discuss her progress with her obesity treatment plan along with follow-up of her obesity related diagnoses. Anita Randolph is keeping a food journal and adhering to recommended goals of 1100-1200 calories and 85 grams of protein and states she is following her eating plan approximately 50% of the time. Anita Randolph states she is walking 30 minutes 2 times per week.  Today's visit was #: 14 Starting weight: 168 lbs Starting date: 02/19/2020 Today's weight: 152 lbs Today's date: 11/03/2020 Total lbs lost to date: 16 Total lbs lost since last in-office visit: 3  Interim History: Anita Randolph recently experienced abdominal pain with diarrhea and nausea without vomiting. Colonoscopy in 2020 by Dr. Earlean Shawl showed benign polyps. She was seen in the ED on 10/10/2020 and we reviewed these notes with the patient. She has a follow-up scheduled with Dr. Earlean Shawl on 11/05/2020.  Subjective:   Gastroenteritis. Anita Randolph has had abdominal pain with diarrhea and nausea without vomiting. She was treated and released from the ED on 10/10/2020. She has a follow-up appointment scheduled with Dr. Earlean Shawl of GI on 11/05/2020. Colonoscopy in 2020 showed benign polyps, which were removed.   Assessment/Plan:   Gastroenteritis. Anita Randolph will follow-up with Dr. Earlean Shawl on 11/05/2020 as scheduled. She will follow a BRAT diet and advance as tolerated.  Class 1 obesity with serious comorbidity and body mass index (BMI) of 30.0 to 30.9 in adult, unspecified obesity type - BMI greater than 30 at start of program.  Anita Randolph is currently in the action stage of change. As such, her goal is to continue with weight loss efforts. She has agreed to keeping a food journal and adhering to recommended goals of 1100-1200 calories and 85 grams of protein daily.  Handouts were provided on Holiday Strategies and Holiday Recipes.   Exercise goals: Anita Randolph will slowly increase exercise as tolerated.  Behavioral  modification strategies: increasing lean protein intake, no skipping meals, meal planning and cooking strategies and planning for success.  Anita Randolph has agreed to follow-up with our clinic in 4 weeks. She was informed of the importance of frequent follow-up visits to maximize her success with intensive lifestyle modifications for her multiple health conditions.   Objective:   Blood pressure 134/83, pulse 79, temperature (!) 97.3 F (36.3 C), height 5\' 2"  (1.575 m), weight 152 lb (68.9 kg), last menstrual period 11/28/2004, SpO2 98 %. Body mass index is 27.8 kg/m.  General: Cooperative, alert, well developed, in no acute distress. HEENT: Conjunctivae and lids unremarkable. Cardiovascular: Regular rhythm.  Lungs: Normal work of breathing. Neurologic: No focal deficits.   Lab Results  Component Value Date   CREATININE 1.38 (H) 10/10/2020   BUN 15 10/10/2020   NA 137 10/10/2020   K 3.6 10/10/2020   CL 106 10/10/2020   CO2 21 (L) 10/10/2020   Lab Results  Component Value Date   ALT 15 10/10/2020   AST 19 10/10/2020   ALKPHOS 57 10/10/2020   BILITOT 0.9 10/10/2020   Lab Results  Component Value Date   HGBA1C 5.6 09/17/2020   HGBA1C 5.7 (H) 05/28/2020   Lab Results  Component Value Date   INSULIN 8.4 09/17/2020   INSULIN 8.9 05/28/2020   INSULIN 15.0 02/19/2020   Lab Results  Component Value Date   TSH 1.330 05/28/2020   Lab Results  Component Value Date   CHOL 208 (H) 09/17/2020   HDL 67 09/17/2020   LDLCALC 126 (H) 09/17/2020   TRIG 83 09/17/2020  CHOLHDL 3.1 09/17/2020   Lab Results  Component Value Date   WBC 8.5 10/10/2020   HGB 14.8 10/10/2020   HCT 44.8 10/10/2020   MCV 84.2 10/10/2020   PLT 274 10/10/2020   No results found for: IRON, TIBC, FERRITIN  Attestation Statements:   Reviewed by clinician on day of visit: allergies, medications, problem list, medical history, surgical history, family history, social history, and previous encounter  notes.  Time spent on visit including pre-visit chart review and post-visit charting and care was 29 minutes.   I, Michaelene Song, am acting as Location manager for [employer], NP-C   I have reviewed the above documentation for accuracy and completeness, and I agree with the above. -  Anita Randolph d. Anita Snooks, NP-C

## 2020-11-04 ENCOUNTER — Ambulatory Visit: Payer: Medicare Other | Admitting: Podiatry

## 2020-11-05 DIAGNOSIS — K529 Noninfective gastroenteritis and colitis, unspecified: Secondary | ICD-10-CM | POA: Insufficient documentation

## 2020-11-05 DIAGNOSIS — A084 Viral intestinal infection, unspecified: Secondary | ICD-10-CM | POA: Diagnosis not present

## 2020-11-15 ENCOUNTER — Encounter (INDEPENDENT_AMBULATORY_CARE_PROVIDER_SITE_OTHER): Payer: Self-pay | Admitting: Adult Health

## 2020-11-16 DIAGNOSIS — Z20822 Contact with and (suspected) exposure to covid-19: Secondary | ICD-10-CM | POA: Diagnosis not present

## 2020-12-01 ENCOUNTER — Ambulatory Visit (INDEPENDENT_AMBULATORY_CARE_PROVIDER_SITE_OTHER): Payer: Medicare Other | Admitting: Adult Health

## 2020-12-09 ENCOUNTER — Other Ambulatory Visit: Payer: Self-pay

## 2020-12-09 ENCOUNTER — Encounter (INDEPENDENT_AMBULATORY_CARE_PROVIDER_SITE_OTHER): Payer: Self-pay | Admitting: Physician Assistant

## 2020-12-09 ENCOUNTER — Ambulatory Visit (INDEPENDENT_AMBULATORY_CARE_PROVIDER_SITE_OTHER): Payer: Medicare Other | Admitting: Physician Assistant

## 2020-12-09 VITALS — BP 131/89 | HR 84 | Temp 98.6°F | Ht 62.0 in | Wt 154.0 lb

## 2020-12-09 DIAGNOSIS — E7849 Other hyperlipidemia: Secondary | ICD-10-CM

## 2020-12-09 DIAGNOSIS — E559 Vitamin D deficiency, unspecified: Secondary | ICD-10-CM

## 2020-12-09 DIAGNOSIS — Z6830 Body mass index (BMI) 30.0-30.9, adult: Secondary | ICD-10-CM | POA: Diagnosis not present

## 2020-12-09 DIAGNOSIS — E669 Obesity, unspecified: Secondary | ICD-10-CM

## 2020-12-10 NOTE — Progress Notes (Signed)
Chief Complaint:   OBESITY Naiomy is here to discuss her progress with her obesity treatment plan along with follow-up of her obesity related diagnoses. Lannah is on keeping a food journal and adhering to recommended goals of 1100-1200 calories and 85 grams of protein daily and states she is following her eating plan approximately 25% of the time. Brittny states she is walking 60,000-70,000 steps per week.   Today's visit was #: 15 Starting weight: 168 lbs Starting date: 02/19/2020 Today's weight: 154 lbs Today's date: 12/09/2020 Total lbs lost to date: 14 Total lbs lost since last in-office visit: 0  Interim History: Bristol reports that she was off track over the holidays. She did a lot of traveling and was not focused on her plan. Her exercise decreased as well. She is ready to restart and plans to increase her exercise.  Subjective:   1. Other hyperlipidemia Anwitha is on atorvastatin, and she denies chest pain or myalgias. She is managed by her primary care physician, but she saw Cardiology last year is due for a follow up.  2. Vitamin D deficiency Denetra is on Vit D, and her last Vit D level was 50.3. Her energy reported to be good.  Assessment/Plan:   1. Other hyperlipidemia Cardiovascular risk and specific lipid/LDL goals reviewed. We discussed several lifestyle modifications today. We will recheck labs at her next visit. Shakirra will follow up with her primary care physician, and will continue her medications and meal plan. She will continue to work on exercise and weight loss efforts. Orders and follow up as documented in patient record.   Counseling Intensive lifestyle modifications are the first line treatment for this issue. . Dietary changes: Increase soluble fiber. Decrease simple carbohydrates. . Exercise changes: Moderate to vigorous-intensity aerobic activity 150 minutes per week if tolerated. . Lipid-lowering medications: see documented in medical record.  2. Vitamin D  deficiency Low Vitamin D level contributes to fatigue and are associated with obesity, breast, and colon cancer. Monai agreed to continue taking Vitamin D and will follow-up for routine testing of Vitamin D, at least 2-3 times per year to avoid over-replacement.  3. Class 1 obesity with serious comorbidity and body mass index (BMI) of 30.0 to 30.9 in adult, unspecified obesity type Jillane is currently in the action stage of change. As such, her goal is to continue with weight loss efforts. She has agreed to keeping a food journal and adhering to recommended goals of 1100-1200 calories and 85 grams of protein daily.   We will recheck labs at her next visit.  Exercise goals: As is.  Behavioral modification strategies: meal planning and cooking strategies and keeping healthy foods in the home.  Jacquiline has agreed to follow-up with our clinic in 2 to 3 weeks. She was informed of the importance of frequent follow-up visits to maximize her success with intensive lifestyle modifications for her multiple health conditions.   Objective:   Blood pressure 131/89, pulse 84, temperature 98.6 F (37 C), height 5\' 2"  (1.575 m), weight 154 lb (69.9 kg), last menstrual period 11/28/2004, SpO2 98 %. Body mass index is 28.17 kg/m.  General: Cooperative, alert, well developed, in no acute distress. HEENT: Conjunctivae and lids unremarkable. Cardiovascular: Regular rhythm.  Lungs: Normal work of breathing. Neurologic: No focal deficits.   Lab Results  Component Value Date   CREATININE 1.38 (H) 10/10/2020   BUN 15 10/10/2020   NA 137 10/10/2020   K 3.6 10/10/2020   CL 106 10/10/2020  CO2 21 (L) 10/10/2020   Lab Results  Component Value Date   ALT 15 10/10/2020   AST 19 10/10/2020   ALKPHOS 57 10/10/2020   BILITOT 0.9 10/10/2020   Lab Results  Component Value Date   HGBA1C 5.6 09/17/2020   HGBA1C 5.7 (H) 05/28/2020   Lab Results  Component Value Date   INSULIN 8.4 09/17/2020   INSULIN 8.9  05/28/2020   INSULIN 15.0 02/19/2020   Lab Results  Component Value Date   TSH 1.330 05/28/2020   Lab Results  Component Value Date   CHOL 208 (H) 09/17/2020   HDL 67 09/17/2020   LDLCALC 126 (H) 09/17/2020   TRIG 83 09/17/2020   CHOLHDL 3.1 09/17/2020   Lab Results  Component Value Date   WBC 8.5 10/10/2020   HGB 14.8 10/10/2020   HCT 44.8 10/10/2020   MCV 84.2 10/10/2020   PLT 274 10/10/2020   No results found for: IRON, TIBC, FERRITIN  Obesity Behavioral Intervention:   Approximately 15 minutes were spent on the discussion below.  ASK: We discussed the diagnosis of obesity with Noelle today and Shaquanna agreed to give [place] permission to discuss obesity behavioral modification therapy today.  ASSESS: Lia has the diagnosis of obesity and her BMI today is 28.16. Aloria is in the action stage of change.   ADVISE: Mekesha was educated on the multiple health risks of obesity as well as the benefit of weight loss to improve her health. She was advised of the need for long term treatment and the importance of lifestyle modifications to improve her current health and to decrease her risk of future health problems.  AGREE: Multiple dietary modification options and treatment options were discussed and Jalexis agreed to follow the recommendations documented in the above note.  ARRANGE: Abria was educated on the importance of frequent visits to treat obesity as outlined per CMS and USPSTF guidelines and agreed to schedule her next follow up appointment today.  Attestation Statements:   Reviewed by clinician on day of visit: allergies, medications, problem list, medical history, surgical history, family history, social history, and previous encounter notes.   Wilhemena Durie, am acting as transcriptionist for [employer], PA-C.  I have reviewed the above documentation for accuracy and completeness, and I agree with the above. Abby Potash, PA-C

## 2020-12-15 DIAGNOSIS — D1801 Hemangioma of skin and subcutaneous tissue: Secondary | ICD-10-CM | POA: Diagnosis not present

## 2020-12-15 DIAGNOSIS — L814 Other melanin hyperpigmentation: Secondary | ICD-10-CM | POA: Diagnosis not present

## 2020-12-15 DIAGNOSIS — L820 Inflamed seborrheic keratosis: Secondary | ICD-10-CM | POA: Diagnosis not present

## 2020-12-15 DIAGNOSIS — D225 Melanocytic nevi of trunk: Secondary | ICD-10-CM | POA: Diagnosis not present

## 2020-12-15 DIAGNOSIS — D2272 Melanocytic nevi of left lower limb, including hip: Secondary | ICD-10-CM | POA: Diagnosis not present

## 2020-12-15 DIAGNOSIS — D2262 Melanocytic nevi of left upper limb, including shoulder: Secondary | ICD-10-CM | POA: Diagnosis not present

## 2020-12-15 DIAGNOSIS — L821 Other seborrheic keratosis: Secondary | ICD-10-CM | POA: Diagnosis not present

## 2020-12-23 ENCOUNTER — Other Ambulatory Visit: Payer: Self-pay

## 2020-12-23 ENCOUNTER — Ambulatory Visit (INDEPENDENT_AMBULATORY_CARE_PROVIDER_SITE_OTHER): Payer: Medicare Other | Admitting: Adult Health

## 2020-12-23 ENCOUNTER — Encounter (INDEPENDENT_AMBULATORY_CARE_PROVIDER_SITE_OTHER): Payer: Self-pay | Admitting: Adult Health

## 2020-12-23 VITALS — BP 118/79 | HR 77 | Temp 97.5°F | Ht 62.0 in | Wt 153.0 lb

## 2020-12-23 DIAGNOSIS — E7849 Other hyperlipidemia: Secondary | ICD-10-CM | POA: Diagnosis not present

## 2020-12-23 DIAGNOSIS — N189 Chronic kidney disease, unspecified: Secondary | ICD-10-CM | POA: Diagnosis not present

## 2020-12-23 DIAGNOSIS — E669 Obesity, unspecified: Secondary | ICD-10-CM

## 2020-12-23 DIAGNOSIS — R7303 Prediabetes: Secondary | ICD-10-CM

## 2020-12-23 DIAGNOSIS — Z6830 Body mass index (BMI) 30.0-30.9, adult: Secondary | ICD-10-CM | POA: Diagnosis not present

## 2020-12-24 LAB — COMPREHENSIVE METABOLIC PANEL
ALT: 17 IU/L (ref 0–32)
AST: 20 IU/L (ref 0–40)
Albumin/Globulin Ratio: 1.8 (ref 1.2–2.2)
Albumin: 4.6 g/dL (ref 3.8–4.8)
Alkaline Phosphatase: 74 IU/L (ref 44–121)
BUN/Creatinine Ratio: 16 (ref 12–28)
BUN: 17 mg/dL (ref 8–27)
Bilirubin Total: 0.7 mg/dL (ref 0.0–1.2)
CO2: 25 mmol/L (ref 20–29)
Calcium: 9.9 mg/dL (ref 8.7–10.3)
Chloride: 103 mmol/L (ref 96–106)
Creatinine, Ser: 1.09 mg/dL — ABNORMAL HIGH (ref 0.57–1.00)
GFR calc Af Amer: 60 mL/min/{1.73_m2} (ref >59)
GFR calc non Af Amer: 52 mL/min/{1.73_m2} — ABNORMAL LOW (ref >59)
Globulin, Total: 2.5 g/dL (ref 1.5–4.5)
Glucose: 106 mg/dL — ABNORMAL HIGH (ref 65–99)
Potassium: 4.6 mmol/L (ref 3.5–5.2)
Sodium: 142 mmol/L (ref 134–144)
Total Protein: 7.1 g/dL (ref 6.0–8.5)

## 2020-12-24 LAB — INSULIN, RANDOM: INSULIN: 9.2 u[IU]/mL (ref 2.6–24.9)

## 2020-12-24 LAB — LIPID PANEL
Chol/HDL Ratio: 3.6 ratio (ref 0.0–4.4)
Cholesterol, Total: 208 mg/dL — ABNORMAL HIGH (ref 100–199)
HDL: 58 mg/dL (ref >39)
LDL Chol Calc (NIH): 124 mg/dL — ABNORMAL HIGH (ref 0–99)
Triglycerides: 145 mg/dL (ref 0–149)
VLDL Cholesterol Cal: 26 mg/dL (ref 5–40)

## 2020-12-24 LAB — HEMOGLOBIN A1C
Est. average glucose Bld gHb Est-mCnc: 120 mg/dL
Hgb A1c MFr Bld: 5.8 % — ABNORMAL HIGH (ref 4.8–5.6)

## 2020-12-24 NOTE — Progress Notes (Signed)
Chief Complaint:   OBESITY Anita Randolph is here to discuss her progress with her obesity treatment plan along with follow-up of her obesity related diagnoses. Anita Randolph is on keeping a food journal and adhering to recommended goals of 1100-1200 calories and 85 g protein and states she is following her eating plan approximately 50% of the time. Anita Randolph states she is walking 40,000 steps a week.  Today's visit was #: 52 Starting weight: 168 lbs Starting date: 02/19/2020 Today's weight: 153 lbs Today's date: 12/23/2020 Total lbs lost to date: 15 lbs Total lbs lost since last in-office visit: 1 lb  Interim History: Pt woke up Monday morning with lumbar back muscle stiffness/pain. She denies acute injury or accident prior to onset of pain. She has been treating symptoms with rest, heating pad, stretching, and regular massage. She feels stalled weight loss and has been unable to walk her typical robust 50,000-75,000 steps per week due to inclement weather and acute back pain.  Subjective:   1. Other hyperlipidemia Pt is on atorvastatin 40 mg daily and tolerating it well. 09/17/2020 Lipid panel resulted total cholesterol and LDL both above goal.    Ref. Range 09/17/2020 10:02  Total CHOL/HDL Ratio Latest Ref Range: 0.0 - 4.4 ratio 3.1  Cholesterol, Total Latest Ref Range: 100 - 199 mg/dL 208 (H)  HDL Cholesterol Latest Ref Range: >39 mg/dL 67  Triglycerides Latest Ref Range: 0 - 149 mg/dL 83  VLDL Cholesterol Cal Latest Ref Range: 5 - 40 mg/dL 15  LDL Chol Calc (NIH) Latest Ref Range: 0 - 99 mg/dL 126 (H)    2. Pre-diabetes A1c has been as high as 5.7 on 05/28/2020. Pt is not on any anti-diabetic meds, as she is reducing blood glucose and insulin with lifestyle modifications.   Ref. Range 09/17/2020 10:02  Glucose Latest Ref Range: 65 - 99 mg/dL 88  Hemoglobin A1C Latest Ref Range: 4.8 - 5.6 % 5.6  Est. average glucose Bld gHb Est-mCnc Latest Units: mg/dL 114    Lab Results  Component Value  Date   INSULIN 9.2 12/23/2020   INSULIN 8.4 09/17/2020   INSULIN 8.9 05/28/2020   INSULIN 15.0 02/19/2020     Assessment/Plan:   1. Other hyperlipidemia Cardiovascular risk and specific lipid/LDL goals reviewed.  We discussed several lifestyle modifications today and Namira will continue to work on diet, exercise and weight loss efforts. Orders and follow up as documented in patient record. Check labs today.  Counseling Intensive lifestyle modifications are the first line treatment for this issue. . Dietary changes: Increase soluble fiber. Decrease simple carbohydrates. . Exercise changes: Moderate to vigorous-intensity aerobic activity 150 minutes per week if tolerated. . Lipid-lowering medications: see documented in medical record.  - Comprehensive metabolic panel - Lipid panel  2. Pre-diabetes Kaydee will continue to work on weight loss, exercise, and decreasing simple carbohydrates to help decrease the risk of diabetes. Check labs today.  - Comprehensive metabolic panel - Lipid panel - Hemoglobin A1c - Insulin, random  3. Class 1 obesity with serious comorbidity and body mass index (BMI) of 30.0 to 30.9 in adult, unspecified obesity type Anita Randolph is currently in the action stage of change. As such, her goal is to continue with weight loss efforts. She has agreed to keeping a food journal and adhering to recommended goals of 1100/1200 calories and 85 g protein.   Change lunch and dinner meal times. Handout provided: Simple Fre[employer] forwarder.   Exercise goals: As is  Behavioral modification strategies: increasing  lean protein intake, meal planning and cooking strategies and planning for success.  Anita Randolph has agreed to follow-up with our clinic in 2 weeks. She was informed of the importance of frequent follow-up visits to maximize her success with intensive lifestyle modifications for her multiple health conditions.   Anita Randolph was informed we would discuss her lab results at her next  visit unless there is a critical issue that needs to be addressed sooner. Anita Randolph agreed to keep her next visit at the agreed upon time to discuss these results.  Objective:   Blood pressure 118/79, pulse 77, temperature (!) 97.5 F (36.4 C), height 5\' 2"  (1.575 m), weight 153 lb (69.4 kg), last menstrual period 11/28/2004, SpO2 98 %. Body mass index is 27.98 kg/m.  General: Cooperative, alert, well developed, in no acute distress. HEENT: Conjunctivae and lids unremarkable. Cardiovascular: Regular rhythm.  Lungs: Normal work of breathing. Neurologic: No focal deficits.   Lab Results  Component Value Date   CREATININE 1.09 (H) 12/23/2020   BUN 17 12/23/2020   NA 142 12/23/2020   K 4.6 12/23/2020   CL 103 12/23/2020   CO2 25 12/23/2020   Lab Results  Component Value Date   ALT 17 12/23/2020   AST 20 12/23/2020   ALKPHOS 74 12/23/2020   BILITOT 0.7 12/23/2020   Lab Results  Component Value Date   HGBA1C 5.8 (H) 12/23/2020   HGBA1C 5.6 09/17/2020   HGBA1C 5.7 (H) 05/28/2020   Lab Results  Component Value Date   INSULIN 9.2 12/23/2020   INSULIN 8.4 09/17/2020   INSULIN 8.9 05/28/2020   INSULIN 15.0 02/19/2020   Lab Results  Component Value Date   TSH 1.330 05/28/2020   Lab Results  Component Value Date   CHOL 208 (H) 12/23/2020   HDL 58 12/23/2020   LDLCALC 124 (H) 12/23/2020   TRIG 145 12/23/2020   CHOLHDL 3.6 12/23/2020   Lab Results  Component Value Date   WBC 8.5 10/10/2020   HGB 14.8 10/10/2020   HCT 44.8 10/10/2020   MCV 84.2 10/10/2020   PLT 274 10/10/2020   No results found for: IRON, TIBC, FERRITIN  Obesity Behavioral Intervention:   Approximately 15 minutes were spent on the discussion below.  ASK: We discussed the diagnosis of obesity with Sharia today and Tunya agreed to give [place] permission to discuss obesity behavioral modification therapy today.  ASSESS: Evelisse has the diagnosis of obesity and her BMI today is 28.0. Yoselyn is in the action  stage of change.   ADVISE: Tomeka was educated on the multiple health risks of obesity as well as the benefit of weight loss to improve her health. She was advised of the need for long term treatment and the importance of lifestyle modifications to improve her current health and to decrease her risk of future health problems.  AGREE: Multiple dietary modification options and treatment options were discussed and Jacari agreed to follow the recommendations documented in the above note.  ARRANGE: Avonna was educated on the importance of frequent visits to treat obesity as outlined per CMS and USPSTF guidelines and agreed to schedule her next follow up appointment today.  Attestation Statements:   Reviewed by clinician on day of visit: allergies, medications, problem list, medical history, surgical history, family history, social history, and previous encounter notes.  Coral Ceo, am acting as Location manager for Mina Marble, NP.  I have reviewed the above documentation for accuracy and completeness, and I agree with the above. -  Dezi Schaner d.  Briunna Leicht, NP-C

## 2021-01-04 ENCOUNTER — Ambulatory Visit (INDEPENDENT_AMBULATORY_CARE_PROVIDER_SITE_OTHER): Payer: Medicare Other | Admitting: Podiatry

## 2021-01-04 ENCOUNTER — Other Ambulatory Visit: Payer: Self-pay

## 2021-01-04 ENCOUNTER — Encounter: Payer: Self-pay | Admitting: Podiatry

## 2021-01-04 DIAGNOSIS — L600 Ingrowing nail: Secondary | ICD-10-CM

## 2021-01-04 NOTE — Patient Instructions (Signed)

## 2021-01-04 NOTE — Progress Notes (Signed)
Subjective:   Patient ID: Anita Randolph, female   DOB: 70 y.o.   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  Understands risk and today I infiltrated 60 mg like Marcaine mixture sterile prep done and using sterile instrumentation remove the lateral border exposed matrix applied phenol 3 applications 30 seconds followed by alcohol lavage sterile dressing gave instructions on soaks and to leave dressing on 24 hours but take it off earlier if any throbbing were to occur.  Patient is encouraged to call questions concerns

## 2021-01-06 ENCOUNTER — Ambulatory Visit (INDEPENDENT_AMBULATORY_CARE_PROVIDER_SITE_OTHER): Payer: Medicare Other | Admitting: Adult Health

## 2021-01-06 ENCOUNTER — Other Ambulatory Visit: Payer: Self-pay

## 2021-01-06 ENCOUNTER — Encounter (INDEPENDENT_AMBULATORY_CARE_PROVIDER_SITE_OTHER): Payer: Self-pay | Admitting: Adult Health

## 2021-01-06 VITALS — BP 140/77 | HR 88 | Temp 98.0°F | Ht 62.0 in | Wt 152.0 lb

## 2021-01-06 DIAGNOSIS — N189 Chronic kidney disease, unspecified: Secondary | ICD-10-CM | POA: Diagnosis not present

## 2021-01-06 DIAGNOSIS — Z9189 Other specified personal risk factors, not elsewhere classified: Secondary | ICD-10-CM

## 2021-01-06 DIAGNOSIS — E7849 Other hyperlipidemia: Secondary | ICD-10-CM | POA: Diagnosis not present

## 2021-01-06 DIAGNOSIS — Z6830 Body mass index (BMI) 30.0-30.9, adult: Secondary | ICD-10-CM | POA: Diagnosis not present

## 2021-01-06 DIAGNOSIS — R7303 Prediabetes: Secondary | ICD-10-CM

## 2021-01-06 DIAGNOSIS — E669 Obesity, unspecified: Secondary | ICD-10-CM | POA: Diagnosis not present

## 2021-01-06 MED ORDER — ATORVASTATIN CALCIUM 40 MG PO TABS
40.0000 mg | ORAL_TABLET | Freq: Every day | ORAL | 0 refills | Status: DC
Start: 2021-01-06 — End: 2021-04-14

## 2021-01-07 DIAGNOSIS — N189 Chronic kidney disease, unspecified: Secondary | ICD-10-CM | POA: Insufficient documentation

## 2021-01-07 NOTE — Progress Notes (Signed)
Chief Complaint:   OBESITY Anita Randolph is here to discuss her progress with her obesity treatment plan along with follow-up of her obesity related diagnoses. Anita Randolph is on keeping a food journal and adhering to recommended goals of 1100-1200 calories and 85 g protein and states she is following her eating plan approximately 77% of the time. Anita Randolph states she is doing 0 minutes 0 times per week.  Today's visit was #: 63 Starting weight: 168 lbs Starting date: 02/19/2020 Today's weight: 152 lbs Today's date: 01/06/2021 Total lbs lost to date: 16 lbs Total lbs lost since last in-office visit: 1 lb  Interim History: Pt's daughter became recently engaged and the wedding is set for Sept 2022. She has been unable to walk as frequently as she would like due to back pain (pain is lessening).  Interval goal: Limit diet soda and increase daily water intake. Increase walking- strive to hit 70,000 steps a week.  Subjective:   1. Chronic kidney disease, unspecified CKD stage Discussed labs with patient today. 12/23/2020 CMP showed improved Kidney function, with creatinine 1.09 and GFR 52.  Lab Results  Component Value Date   CREATININE 1.09 (H) 12/23/2020   CREATININE 1.38 (H) 10/10/2020   CREATININE 1.20 (H) 09/17/2020   Lab Results  Component Value Date   CREATININE 1.09 (H) 12/23/2020   BUN 17 12/23/2020   NA 142 12/23/2020   K 4.6 12/23/2020   CL 103 12/23/2020   CO2 25 12/23/2020    2. Other hyperlipidemia pure Discussed labs with patient today. 12/23/2020- Lipid panel resulted total and LDL slightly above goal. Pt was previously on simvastatin 40 mg and changed to atorvastatin 40 mg- 3 months ago. ASCVD 6.6%.   Lab Results  Component Value Date   ALT 17 12/23/2020   AST 20 12/23/2020   ALKPHOS 74 12/23/2020   BILITOT 0.7 12/23/2020   Lab Results  Component Value Date   CHOL 208 (H) 12/23/2020   HDL 58 12/23/2020   LDLCALC 124 (H) 12/23/2020   TRIG 145 12/23/2020   CHOLHDL 3.6  12/23/2020    The 10-year ASCVD risk score Anita Bussing DC Jr., et al., 2013) is: 9.1%   Values used to calculate the score:     Age: 70 years     Sex: Female     Is Non-Hispanic African American: No     Diabetic: No     Tobacco smoker: No     Systolic Blood Pressure: 462 mmHg     Is BP treated: No     HDL Cholesterol: 58 mg/dL     Total Cholesterol: 208 mg/dL   3. Pre-diabetes Discussed labs with patient today. 12/23/2020 A1c worsening 5.8, with a stable blood glucose and insulin level. Pt reports increased carb and sugar intake over the last few months. She denies polyphagia.  Lab Results  Component Value Date   HGBA1C 5.8 (H) 12/23/2020   Lab Results  Component Value Date   INSULIN 9.2 12/23/2020   INSULIN 8.4 09/17/2020   INSULIN 8.9 05/28/2020   INSULIN 15.0 02/19/2020    Assessment/Plan:   1. Chronic kidney disease, unspecified CKD stage Lab results and trends reviewed. We discussed several lifestyle modifications today and she will continue to work on diet, exercise and weight loss efforts. Avoid nephrotoxic medications. Orders and follow up as documented in patient record. Avoid NSAIDS. Monitor labs.  Counseling . Chronic kidney disease (CKD) happens when the kidneys are damaged over a long period of time. Marland Kitchen  Most of the time, this condition does not go away, but it can usually be controlled. Steps must be taken to slow down the kidney damage or to stop it from getting worse. . Intensive lifestyle modifications are the first line treatment for this issue.  Marland Kitchen Avoid buying foods that are: processed, frozen, or prepackaged to avoid excess salt.   2. Other hyperlipidemia pure Cardiovascular risk and specific lipid/LDL goals reviewed.  We discussed several lifestyle modifications today and Anita Randolph will continue to work on diet, exercise and weight loss efforts. Orders and follow up as documented in patient record. Consider dose adjustment after next lipid panel if LDL has not  reduced.  Counseling Intensive lifestyle modifications are the first line treatment for this issue. . Dietary changes: Increase soluble fiber. Decrease simple carbohydrates. . Exercise changes: Moderate to vigorous-intensity aerobic activity 150 minutes per week if tolerated. . Lipid-lowering medications: see documented in medical record.   3. Pre-diabetes Anita Randolph will continue to work on weight loss, exercise, and decreasing simple carbohydrates to help decrease the risk of diabetes. Increase protein, reduce simple carbohydrates, and increase daily walking.  4. Class 1 obesity with serious comorbidity and body mass index (BMI) of 30.0 to 30.9 in adult, unspecified obesity type Anita Randolph is currently in the action stage of change. As such, her goal is to continue with weight loss efforts. She has agreed to keeping a food journal and adhering to recommended goals of 1100-1200 calories and 85 g protein.   Exercise goals: Increase activity as tolerated. Increase daily walking.  Behavioral modification strategies: increasing lean protein intake, decreasing simple carbohydrates, meal planning and cooking strategies, better snacking choices, planning for success and keeping a strict food journal.  Anita Randolph has agreed to follow-up with our clinic in 2 weeks. She was informed of the importance of frequent follow-up visits to maximize her success with intensive lifestyle modifications for her multiple health conditions.   Objective:   Blood pressure 140/77, pulse 88, temperature 98 F (36.7 C), height 5\' 2"  (1.575 m), weight 152 lb (68.9 kg), last menstrual period 11/28/2004, SpO2 99 %. Body mass index is 27.8 kg/m.  General: Cooperative, alert, well developed, in no acute distress. HEENT: Conjunctivae and lids unremarkable. Cardiovascular: Regular rhythm.  Lungs: Normal work of breathing. Neurologic: No focal deficits.   Lab Results  Component Value Date   CREATININE 1.09 (H) 12/23/2020   BUN 17  12/23/2020   NA 142 12/23/2020   K 4.6 12/23/2020   CL 103 12/23/2020   CO2 25 12/23/2020   Lab Results  Component Value Date   ALT 17 12/23/2020   AST 20 12/23/2020   ALKPHOS 74 12/23/2020   BILITOT 0.7 12/23/2020   Lab Results  Component Value Date   HGBA1C 5.8 (H) 12/23/2020   HGBA1C 5.6 09/17/2020   HGBA1C 5.7 (H) 05/28/2020   Lab Results  Component Value Date   INSULIN 9.2 12/23/2020   INSULIN 8.4 09/17/2020   INSULIN 8.9 05/28/2020   INSULIN 15.0 02/19/2020   Lab Results  Component Value Date   TSH 1.330 05/28/2020   Lab Results  Component Value Date   CHOL 208 (H) 12/23/2020   HDL 58 12/23/2020   LDLCALC 124 (H) 12/23/2020   TRIG 145 12/23/2020   CHOLHDL 3.6 12/23/2020   Lab Results  Component Value Date   WBC 8.5 10/10/2020   HGB 14.8 10/10/2020   HCT 44.8 10/10/2020   MCV 84.2 10/10/2020   PLT 274 10/10/2020   No results  found for: IRON, TIBC, FERRITIN   Attestation Statements:   Reviewed by clinician on day of visit: allergies, medications, problem list, medical history, surgical history, family history, social history, and previous encounter notes.  Coral Ceo, am acting as Location manager for Mina Marble, NP.  I have reviewed the above documentation for accuracy and completeness, and I agree with the above. -  Anita Randolph d. Anita Dinkel, NP-C

## 2021-01-21 ENCOUNTER — Other Ambulatory Visit: Payer: Self-pay

## 2021-01-21 ENCOUNTER — Ambulatory Visit (INDEPENDENT_AMBULATORY_CARE_PROVIDER_SITE_OTHER): Payer: Medicare Other | Admitting: Adult Health

## 2021-01-21 ENCOUNTER — Encounter (INDEPENDENT_AMBULATORY_CARE_PROVIDER_SITE_OTHER): Payer: Self-pay | Admitting: Adult Health

## 2021-01-21 VITALS — BP 115/75 | HR 75 | Temp 97.7°F | Ht 62.0 in | Wt 153.0 lb

## 2021-01-21 DIAGNOSIS — E782 Mixed hyperlipidemia: Secondary | ICD-10-CM | POA: Diagnosis not present

## 2021-01-21 DIAGNOSIS — H33301 Unspecified retinal break, right eye: Secondary | ICD-10-CM | POA: Diagnosis not present

## 2021-01-21 DIAGNOSIS — E663 Overweight: Secondary | ICD-10-CM

## 2021-01-21 DIAGNOSIS — H5212 Myopia, left eye: Secondary | ICD-10-CM | POA: Diagnosis not present

## 2021-01-21 DIAGNOSIS — Z6828 Body mass index (BMI) 28.0-28.9, adult: Secondary | ICD-10-CM | POA: Diagnosis not present

## 2021-01-22 DIAGNOSIS — M5459 Other low back pain: Secondary | ICD-10-CM | POA: Diagnosis not present

## 2021-01-25 NOTE — Progress Notes (Signed)
Chief Complaint:   OBESITY Shonte is here to discuss her progress with her obesity treatment plan along with follow-up of her obesity related diagnoses. Dorathea is on keeping a food journal and adhering to recommended goals of 1100-1200 calories and 85 g protein and states she is following her eating plan approximately 70% of the time. Ilianna states she is walking 45 minutes 3-4 times per week.  Today's visit was #: 85 Starting weight: 168 lbs Starting date: 02/19/2020 Today's weight: 153 lbs Today's date: 01/21/2021 Total lbs lost to date: 15 lbs Total lbs lost since last in-office visit: 0  Interim History: Gloria is tracking her intake 70% of the time. She estimates to hit calorie goal (1100-1200) 85% of the time and protein goal (85 g) 50% of the time. She recently joined [employer]. Her first appointment is 01/25/2021.  Subjective:   1. Mixed hyperlipidemia Aliannah is on atorvastatin 40 mg daily and tolerating it well.  Lab Results  Component Value Date   ALT 17 12/23/2020   AST 20 12/23/2020   ALKPHOS 74 12/23/2020   BILITOT 0.7 12/23/2020   Lab Results  Component Value Date   CHOL 208 (H) 12/23/2020   HDL 58 12/23/2020   LDLCALC 124 (H) 12/23/2020   TRIG 145 12/23/2020   CHOLHDL 3.6 12/23/2020    Assessment/Plan:   1. Mixed hyperlipidemia Cardiovascular risk and specific lipid/LDL goals reviewed.  We discussed several lifestyle modifications today and Tesslyn will continue to work on diet, exercise and weight loss efforts. Orders and follow up as documented in patient record. Continue atorvastatin and decrease saturated fat.  Counseling Intensive lifestyle modifications are the first line treatment for this issue. . Dietary changes: Increase soluble fiber. Decrease simple carbohydrates. . Exercise changes: Moderate to vigorous-intensity aerobic activity 150 minutes per week if tolerated. . Lipid-lowering medications: see documented in medical record.  2. Overweight  BMI-28.0 Viviann is currently in the action stage of change. As such, her goal is to continue with weight loss efforts. She has agreed to keeping a food journal and adhering to recommended goals of 1100-1200 calories and 85 g protein.   Protein One Bar for snacks. 3 months- make note of muscle mass  Exercise goals: As is  Behavioral modification strategies: increasing lean protein intake, increasing water intake, meal planning and cooking strategies and planning for success.  Jarita has agreed to follow-up with our clinic in 3 weeks. She was informed of the importance of frequent follow-up visits to maximize her success with intensive lifestyle modifications for her multiple health conditions.   Objective:   Blood pressure 115/75, pulse 75, temperature 97.7 F (36.5 C), height 5\' 2"  (1.575 m), weight 153 lb (69.4 kg), last menstrual period 11/28/2004, SpO2 97 %. Body mass index is 27.98 kg/m.  General: Cooperative, alert, well developed, in no acute distress. HEENT: Conjunctivae and lids unremarkable. Cardiovascular: Regular rhythm.  Lungs: Normal work of breathing. Neurologic: No focal deficits.   Lab Results  Component Value Date   CREATININE 1.09 (H) 12/23/2020   BUN 17 12/23/2020   NA 142 12/23/2020   K 4.6 12/23/2020   CL 103 12/23/2020   CO2 25 12/23/2020   Lab Results  Component Value Date   ALT 17 12/23/2020   AST 20 12/23/2020   ALKPHOS 74 12/23/2020   BILITOT 0.7 12/23/2020   Lab Results  Component Value Date   HGBA1C 5.8 (H) 12/23/2020   HGBA1C 5.6 09/17/2020   HGBA1C 5.7 (H) 05/28/2020  Lab Results  Component Value Date   INSULIN 9.2 12/23/2020   INSULIN 8.4 09/17/2020   INSULIN 8.9 05/28/2020   INSULIN 15.0 02/19/2020   Lab Results  Component Value Date   TSH 1.330 05/28/2020   Lab Results  Component Value Date   CHOL 208 (H) 12/23/2020   HDL 58 12/23/2020   LDLCALC 124 (H) 12/23/2020   TRIG 145 12/23/2020   CHOLHDL 3.6 12/23/2020   Lab  Results  Component Value Date   WBC 8.5 10/10/2020   HGB 14.8 10/10/2020   HCT 44.8 10/10/2020   MCV 84.2 10/10/2020   PLT 274 10/10/2020    Attestation Statements:   Reviewed by clinician on day of visit: allergies, medications, problem list, medical history, surgical history, family history, social history, and previous encounter notes.  Time spent on visit including pre-visit chart review and post-visit care and charting was 28 minutes.   Coral Ceo, am acting as Location manager for Mina Marble, NP.  I have reviewed the above documentation for accuracy and completeness, and I agree with the above. -  Kingstyn Deruiter d. Gracelynn Bircher, NP-C

## 2021-01-26 DIAGNOSIS — M8588 Other specified disorders of bone density and structure, other site: Secondary | ICD-10-CM | POA: Diagnosis not present

## 2021-01-26 DIAGNOSIS — N958 Other specified menopausal and perimenopausal disorders: Secondary | ICD-10-CM | POA: Diagnosis not present

## 2021-02-09 DIAGNOSIS — Z0000 Encounter for general adult medical examination without abnormal findings: Secondary | ICD-10-CM | POA: Diagnosis not present

## 2021-02-10 ENCOUNTER — Ambulatory Visit (INDEPENDENT_AMBULATORY_CARE_PROVIDER_SITE_OTHER): Payer: Medicare Other | Admitting: Adult Health

## 2021-02-16 ENCOUNTER — Encounter (INDEPENDENT_AMBULATORY_CARE_PROVIDER_SITE_OTHER): Payer: Self-pay | Admitting: Adult Health

## 2021-02-16 ENCOUNTER — Encounter (INDEPENDENT_AMBULATORY_CARE_PROVIDER_SITE_OTHER): Payer: Self-pay

## 2021-02-16 ENCOUNTER — Telehealth (INDEPENDENT_AMBULATORY_CARE_PROVIDER_SITE_OTHER): Payer: Medicare Other | Admitting: Adult Health

## 2021-02-16 ENCOUNTER — Other Ambulatory Visit: Payer: Self-pay

## 2021-02-16 DIAGNOSIS — E559 Vitamin D deficiency, unspecified: Secondary | ICD-10-CM

## 2021-02-16 DIAGNOSIS — E669 Obesity, unspecified: Secondary | ICD-10-CM | POA: Diagnosis not present

## 2021-02-16 DIAGNOSIS — Z6830 Body mass index (BMI) 30.0-30.9, adult: Secondary | ICD-10-CM | POA: Diagnosis not present

## 2021-02-16 DIAGNOSIS — E782 Mixed hyperlipidemia: Secondary | ICD-10-CM | POA: Diagnosis not present

## 2021-02-17 DIAGNOSIS — E559 Vitamin D deficiency, unspecified: Secondary | ICD-10-CM | POA: Insufficient documentation

## 2021-02-17 NOTE — Progress Notes (Signed)
TeleHealth Visit:  Due to the COVID-19 pandemic, this visit was completed with telemedicine (audio/video) technology to reduce patient and provider exposure as well as to preserve personal protective equipment.   Sagan has verbally consented to this TeleHealth visit. The patient is located at home, the provider is located at the [facility] office. The participants in this visit include the listed provider and patient. The visit was conducted today via video.   Chief Complaint: OBESITY Meyah is here to discuss her progress with her obesity treatment plan along with follow-up of her obesity related diagnoses. Derya is on keeping a food journal and adhering to recommended goals of 1100-1200 calories and 85 g protein and states she is following her eating plan approximately 60% of the time. Edie states she is walking 60 minutes 4-5 times per week.  Today's visit was #: 19 Starting weight: 168 lbs Starting date: 02/19/2020  Interim History: Tranice returned home yesterday from a flight back from [place]. Since then, she is experiencing nasal drainage, non-productive cough, swollen gums, and swollen cervical lymph nodes. She denies known exposure to COVID-19. She will home antigen test either today or tomorrow. She felt that she maintained her weight since last OV.  Subjective:   1. Vitamin D deficiency Kahla's Vitamin D level was 50.3 on 05/28/2020. She is currently taking OTC vitamin D 1,000 units each day.    Ref. Range 05/28/2020 15:46  Vitamin D, 25-Hydroxy Latest Ref Range: 30.0 - 100.0 ng/mL 50.3   2. Mixed hyperlipidemia Heather is on atorvastatin 40 mg QD. She denies myalgias.  Lab Results  Component Value Date   ALT 17 12/23/2020   AST 20 12/23/2020   ALKPHOS 74 12/23/2020   BILITOT 0.7 12/23/2020   Lab Results  Component Value Date   CHOL 208 (H) 12/23/2020   HDL 58 12/23/2020   LDLCALC 124 (H) 12/23/2020   TRIG 145 12/23/2020   CHOLHDL 3.6 12/23/2020     Assessment/Plan:   1. Vitamin D deficiency Low Vitamin D level contributes to fatigue and are associated with obesity, breast, and colon cancer. She agrees to continue to take OTC Vitamin D @1 ,000 IU daily and will follow-up for routine testing of Vitamin D, at least 2-3 times per year to avoid over-replacement. Check labs at the end of April 2022.  2. Mixed hyperlipidemia Cardiovascular risk and specific lipid/LDL goals reviewed.  We discussed several lifestyle modifications today and Yanin will continue to work on diet, exercise and weight loss efforts. Orders and follow up as documented in patient record. Check labs at the end of April 2022.  Counseling Intensive lifestyle modifications are the first line treatment for this issue. . Dietary changes: Increase soluble fiber. Decrease simple carbohydrates. . Exercise changes: Moderate to vigorous-intensity aerobic activity 150 minutes per week if tolerated. . Lipid-lowering medications: see documented in medical record.  3. Class 1 obesity with serious comorbidity and body mass index (BMI) of 30.0 to 30.9 in adult, unspecified obesity type Eriel is currently in the action stage of change. As such, her goal is to continue with weight loss efforts. She has agreed to keeping a food journal and adhering to recommended goals of 1100-1200 calories and 85 g protein.   Increase water, rest, and Vit C.  Exercise goals: As is  Behavioral modification strategies: increasing lean protein intake, increasing water intake, meal planning and cooking strategies, planning for success and keeping a strict food journal.  Amarilys has agreed to follow-up with our  clinic in 3 weeks. She was informed of the importance of frequent follow-up visits to maximize her success with intensive lifestyle modifications for her multiple health conditions.  Objective:   VITALS: Per patient if applicable, see vitals. GENERAL: Alert and in no acute  distress. CARDIOPULMONARY: No increased WOB. Speaking in clear sentences.  PSYCH: Pleasant and cooperative. Speech normal rate and rhythm. Affect is appropriate. Insight and judgement are appropriate. Attention is focused, linear, and appropriate.  NEURO: Oriented as arrived to appointment on time with no prompting.   Lab Results  Component Value Date   CREATININE 1.09 (H) 12/23/2020   BUN 17 12/23/2020   NA 142 12/23/2020   K 4.6 12/23/2020   CL 103 12/23/2020   CO2 25 12/23/2020   Lab Results  Component Value Date   ALT 17 12/23/2020   AST 20 12/23/2020   ALKPHOS 74 12/23/2020   BILITOT 0.7 12/23/2020   Lab Results  Component Value Date   HGBA1C 5.8 (H) 12/23/2020   HGBA1C 5.6 09/17/2020   HGBA1C 5.7 (H) 05/28/2020   Lab Results  Component Value Date   INSULIN 9.2 12/23/2020   INSULIN 8.4 09/17/2020   INSULIN 8.9 05/28/2020   INSULIN 15.0 02/19/2020   Lab Results  Component Value Date   TSH 1.330 05/28/2020   Lab Results  Component Value Date   CHOL 208 (H) 12/23/2020   HDL 58 12/23/2020   LDLCALC 124 (H) 12/23/2020   TRIG 145 12/23/2020   CHOLHDL 3.6 12/23/2020   Lab Results  Component Value Date   WBC 8.5 10/10/2020   HGB 14.8 10/10/2020   HCT 44.8 10/10/2020   MCV 84.2 10/10/2020   PLT 274 10/10/2020   No results found for: IRON, TIBC, FERRITIN  Attestation Statements:   Reviewed by clinician on day of visit: allergies, medications, problem list, medical history, surgical history, family history, social history, and previous encounter notes.  Time spent on visit including pre-visit chart review and post-visit charting and care was 32 minutes.   Coral Ceo, am acting as Location manager for Mina Marble, NP.  I have reviewed the above documentation for accuracy and completeness, and I agree with the above. - Rayette Mogg d. Babette Stum, NP-C

## 2021-02-26 DIAGNOSIS — Z03818 Encounter for observation for suspected exposure to other biological agents ruled out: Secondary | ICD-10-CM | POA: Diagnosis not present

## 2021-02-26 DIAGNOSIS — J029 Acute pharyngitis, unspecified: Secondary | ICD-10-CM | POA: Diagnosis not present

## 2021-03-09 ENCOUNTER — Other Ambulatory Visit: Payer: Self-pay

## 2021-03-09 ENCOUNTER — Ambulatory Visit (INDEPENDENT_AMBULATORY_CARE_PROVIDER_SITE_OTHER): Payer: Medicare Other | Admitting: Adult Health

## 2021-03-09 ENCOUNTER — Encounter (INDEPENDENT_AMBULATORY_CARE_PROVIDER_SITE_OTHER): Payer: Self-pay | Admitting: Adult Health

## 2021-03-09 VITALS — BP 116/74 | HR 91 | Temp 97.8°F | Ht 62.0 in | Wt 152.0 lb

## 2021-03-09 DIAGNOSIS — E559 Vitamin D deficiency, unspecified: Secondary | ICD-10-CM

## 2021-03-09 DIAGNOSIS — E782 Mixed hyperlipidemia: Secondary | ICD-10-CM

## 2021-03-09 DIAGNOSIS — Z6830 Body mass index (BMI) 30.0-30.9, adult: Secondary | ICD-10-CM

## 2021-03-09 DIAGNOSIS — R7303 Prediabetes: Secondary | ICD-10-CM

## 2021-03-09 DIAGNOSIS — E669 Obesity, unspecified: Secondary | ICD-10-CM | POA: Diagnosis not present

## 2021-03-10 NOTE — Progress Notes (Signed)
Chief Complaint:   OBESITY Anita Randolph is here to discuss her progress with her obesity treatment plan along with follow-up of her obesity related diagnoses. Anita Randolph is on keeping a food journal and adhering to recommended goals of 1100-1200 calories and 85 g protein and states she is following her eating plan approximately 50% of the time. Anita Randolph states she is weight training and walking 30-60 minutes 2-5 times per week.  Today's visit was #: 20 Starting weight: 168 lbs Starting date: 02/19/2020 Today's weight: 152 lbs Today's date: 03/09/2021 Total lbs lost to date: 16 lbs Total lbs lost since last in-office visit: 1  Interim History: Anita Randolph has been working with [employer] and enjoying it! She has been focusing on protein intake and increased tracking.  Subjective:   1. Mixed hyperlipidemia Anita Randolph is currently on atorvastatin 40 mg (previously on simvastatin 40 mg). Her 12/23/2020 total 208, LDL 124, HDL 58, and triglycerides 145.  Lab Results  Component Value Date   ALT 17 12/23/2020   AST 20 12/23/2020   ALKPHOS 74 12/23/2020   BILITOT 0.7 12/23/2020   Lab Results  Component Value Date   CHOL 208 (H) 12/23/2020   HDL 58 12/23/2020   LDLCALC 124 (H) 12/23/2020   TRIG 145 12/23/2020   CHOLHDL 3.6 12/23/2020    2. Vitamin D deficiency Anita Randolph's Vitamin D level was 50.3 on 05/28/2020. She is currently taking OTC vitamin D 1,000 IU each day. She denies nausea, vomiting or muscle weakness.  3. Pre-diabetes Anita Randolph's 12/23/2020 A1c 5.8. She is not on Metformin.  Lab Results  Component Value Date   HGBA1C 5.8 (H) 12/23/2020   Lab Results  Component Value Date   INSULIN 9.2 12/23/2020   INSULIN 8.4 09/17/2020   INSULIN 8.9 05/28/2020   INSULIN 15.0 02/19/2020    Assessment/Plan:   1. Mixed hyperlipidemia Cardiovascular risk and specific lipid/LDL goals reviewed.  We discussed several lifestyle modifications today and Zannie will continue to work on diet, exercise  and weight loss efforts. Orders and follow up as documented in patient record. Check labs at next OV.  Counseling Intensive lifestyle modifications are the first line treatment for this issue. . Dietary changes: Increase soluble fiber. Decrease simple carbohydrates. . Exercise changes: Moderate to vigorous-intensity aerobic activity 150 minutes per week if tolerated. . Lipid-lowering medications: see documented in medical record.  2. Vitamin D deficiency Low Vitamin D level contributes to fatigue and are associated with obesity, breast, and colon cancer. She agrees to continue to take OTC Vitamin D @1 ,000 IU daily and will follow-up for routine testing of Vitamin D, at least 2-3 times per year to avoid over-replacement. Check labs at next OV.  3. Pre-diabetes Anita Randolph will continue to work on weight loss, exercise, and decreasing simple carbohydrates to help decrease the risk of diabetes. Check labs at next OV.  4. Obesity with current BMI 27.8 Anita Randolph is currently in the action stage of change. As such, her goal is to continue with weight loss efforts. She has agreed to keeping a food journal and adhering to recommended goals of 1100-1200 calories and 85 g protein.   Handout:  Recipe Guide Check fating labs at next OV  Exercise goals: As is  Behavioral modification strategies: increasing lean protein intake, meal planning and cooking strategies, planning for success and keeping a strict food journal.  Anita Randolph has agreed to follow-up with our clinic in 3 weeks. She was informed of the importance of frequent follow-up visits to maximize  her success with intensive lifestyle modifications for her multiple health conditions.   Objective:   Blood pressure 116/74, pulse 91, temperature 97.8 F (36.6 C), height 5\' 2"  (1.575 m), weight 152 lb (68.9 kg), last menstrual period 11/28/2004, SpO2 98 %. Body mass index is 27.8 kg/m.  General: Cooperative, alert, well developed, in no acute  distress. HEENT: Conjunctivae and lids unremarkable. Cardiovascular: Regular rhythm.  Lungs: Normal work of breathing. Neurologic: No focal deficits.   Lab Results  Component Value Date   CREATININE 1.09 (H) 12/23/2020   BUN 17 12/23/2020   NA 142 12/23/2020   K 4.6 12/23/2020   CL 103 12/23/2020   CO2 25 12/23/2020   Lab Results  Component Value Date   ALT 17 12/23/2020   AST 20 12/23/2020   ALKPHOS 74 12/23/2020   BILITOT 0.7 12/23/2020   Lab Results  Component Value Date   HGBA1C 5.8 (H) 12/23/2020   HGBA1C 5.6 09/17/2020   HGBA1C 5.7 (H) 05/28/2020   Lab Results  Component Value Date   INSULIN 9.2 12/23/2020   INSULIN 8.4 09/17/2020   INSULIN 8.9 05/28/2020   INSULIN 15.0 02/19/2020   Lab Results  Component Value Date   TSH 1.330 05/28/2020   Lab Results  Component Value Date   CHOL 208 (H) 12/23/2020   HDL 58 12/23/2020   LDLCALC 124 (H) 12/23/2020   TRIG 145 12/23/2020   CHOLHDL 3.6 12/23/2020   Lab Results  Component Value Date   WBC 8.5 10/10/2020   HGB 14.8 10/10/2020   HCT 44.8 10/10/2020   MCV 84.2 10/10/2020   PLT 274 10/10/2020    Attestation Statements:   Reviewed by clinician on day of visit: allergies, medications, problem list, medical history, surgical history, family history, social history, and previous encounter notes.  Time spent on visit including pre-visit chart review and post-visit care and charting was 32 minutes.   Coral Ceo, am acting as Location manager for Mina Marble, NP.  I have reviewed the above documentation for accuracy and completeness, and I agree with the above. -  Ercelle Winkles d. Evanne Matsunaga, NP-C

## 2021-03-22 ENCOUNTER — Other Ambulatory Visit: Payer: Self-pay

## 2021-03-22 ENCOUNTER — Encounter (INDEPENDENT_AMBULATORY_CARE_PROVIDER_SITE_OTHER): Payer: Medicare Other | Admitting: Ophthalmology

## 2021-03-22 DIAGNOSIS — H43812 Vitreous degeneration, left eye: Secondary | ICD-10-CM

## 2021-03-22 DIAGNOSIS — D3131 Benign neoplasm of right choroid: Secondary | ICD-10-CM

## 2021-03-22 DIAGNOSIS — H35341 Macular cyst, hole, or pseudohole, right eye: Secondary | ICD-10-CM

## 2021-03-22 DIAGNOSIS — H338 Other retinal detachments: Secondary | ICD-10-CM | POA: Diagnosis not present

## 2021-03-25 ENCOUNTER — Encounter (INDEPENDENT_AMBULATORY_CARE_PROVIDER_SITE_OTHER): Payer: Medicare Other | Admitting: Ophthalmology

## 2021-03-30 DIAGNOSIS — H00012 Hordeolum externum right lower eyelid: Secondary | ICD-10-CM | POA: Diagnosis not present

## 2021-04-13 ENCOUNTER — Other Ambulatory Visit (INDEPENDENT_AMBULATORY_CARE_PROVIDER_SITE_OTHER): Payer: Self-pay | Admitting: Adult Health

## 2021-04-14 ENCOUNTER — Ambulatory Visit (INDEPENDENT_AMBULATORY_CARE_PROVIDER_SITE_OTHER): Payer: Medicare Other | Admitting: Adult Health

## 2021-04-14 NOTE — Telephone Encounter (Signed)
Pt last seen by Katy Danford, FNP.  

## 2021-04-14 NOTE — Telephone Encounter (Signed)
Refill request

## 2021-04-16 DIAGNOSIS — Z23 Encounter for immunization: Secondary | ICD-10-CM | POA: Diagnosis not present

## 2021-05-06 ENCOUNTER — Other Ambulatory Visit: Payer: Self-pay

## 2021-05-10 ENCOUNTER — Ambulatory Visit (INDEPENDENT_AMBULATORY_CARE_PROVIDER_SITE_OTHER): Payer: Medicare Other | Admitting: Adult Health

## 2021-05-10 ENCOUNTER — Encounter (INDEPENDENT_AMBULATORY_CARE_PROVIDER_SITE_OTHER): Payer: Self-pay

## 2021-05-10 ENCOUNTER — Encounter (INDEPENDENT_AMBULATORY_CARE_PROVIDER_SITE_OTHER): Payer: Self-pay | Admitting: Adult Health

## 2021-05-11 DIAGNOSIS — L814 Other melanin hyperpigmentation: Secondary | ICD-10-CM | POA: Diagnosis not present

## 2021-05-11 DIAGNOSIS — L245 Irritant contact dermatitis due to other chemical products: Secondary | ICD-10-CM | POA: Diagnosis not present

## 2021-05-18 ENCOUNTER — Other Ambulatory Visit: Payer: Self-pay | Admitting: Obstetrics and Gynecology

## 2021-05-18 DIAGNOSIS — Z1231 Encounter for screening mammogram for malignant neoplasm of breast: Secondary | ICD-10-CM

## 2021-05-26 ENCOUNTER — Ambulatory Visit (INDEPENDENT_AMBULATORY_CARE_PROVIDER_SITE_OTHER): Payer: Medicare Other | Admitting: Adult Health

## 2021-05-26 ENCOUNTER — Other Ambulatory Visit: Payer: Self-pay

## 2021-05-26 ENCOUNTER — Encounter (INDEPENDENT_AMBULATORY_CARE_PROVIDER_SITE_OTHER): Payer: Self-pay | Admitting: Adult Health

## 2021-05-26 VITALS — BP 136/79 | HR 86 | Temp 97.9°F | Ht 62.0 in | Wt 152.0 lb

## 2021-05-26 DIAGNOSIS — E559 Vitamin D deficiency, unspecified: Secondary | ICD-10-CM

## 2021-05-26 DIAGNOSIS — R5383 Other fatigue: Secondary | ICD-10-CM

## 2021-05-26 DIAGNOSIS — E669 Obesity, unspecified: Secondary | ICD-10-CM

## 2021-05-26 DIAGNOSIS — R7303 Prediabetes: Secondary | ICD-10-CM

## 2021-05-26 DIAGNOSIS — E538 Deficiency of other specified B group vitamins: Secondary | ICD-10-CM | POA: Diagnosis not present

## 2021-05-26 DIAGNOSIS — N1831 Chronic kidney disease, stage 3a: Secondary | ICD-10-CM

## 2021-05-26 DIAGNOSIS — E782 Mixed hyperlipidemia: Secondary | ICD-10-CM

## 2021-05-26 DIAGNOSIS — Z6830 Body mass index (BMI) 30.0-30.9, adult: Secondary | ICD-10-CM | POA: Diagnosis not present

## 2021-05-27 DIAGNOSIS — R5383 Other fatigue: Secondary | ICD-10-CM | POA: Insufficient documentation

## 2021-05-27 DIAGNOSIS — E538 Deficiency of other specified B group vitamins: Secondary | ICD-10-CM | POA: Insufficient documentation

## 2021-05-27 LAB — LIPID PANEL
Chol/HDL Ratio: 2.7 ratio (ref 0.0–4.4)
Cholesterol, Total: 186 mg/dL (ref 100–199)
HDL: 68 mg/dL (ref >39)
LDL Chol Calc (NIH): 103 mg/dL — ABNORMAL HIGH (ref 0–99)
Triglycerides: 81 mg/dL (ref 0–149)
VLDL Cholesterol Cal: 15 mg/dL (ref 5–40)

## 2021-05-27 LAB — COMPREHENSIVE METABOLIC PANEL
ALT: 19 IU/L (ref 0–32)
AST: 26 IU/L (ref 0–40)
Albumin/Globulin Ratio: 1.7 (ref 1.2–2.2)
Albumin: 4.5 g/dL (ref 3.8–4.8)
Alkaline Phosphatase: 68 IU/L (ref 44–121)
BUN/Creatinine Ratio: 18 (ref 12–28)
BUN: 21 mg/dL (ref 8–27)
Bilirubin Total: 0.6 mg/dL (ref 0.0–1.2)
CO2: 24 mmol/L (ref 20–29)
Calcium: 10.1 mg/dL (ref 8.7–10.3)
Chloride: 101 mmol/L (ref 96–106)
Creatinine, Ser: 1.15 mg/dL — ABNORMAL HIGH (ref 0.57–1.00)
Globulin, Total: 2.7 g/dL (ref 1.5–4.5)
Glucose: 95 mg/dL (ref 65–99)
Potassium: 4.4 mmol/L (ref 3.5–5.2)
Sodium: 140 mmol/L (ref 134–144)
Total Protein: 7.2 g/dL (ref 6.0–8.5)
eGFR: 52 mL/min/{1.73_m2} — ABNORMAL LOW (ref >59)

## 2021-05-27 LAB — HEMOGLOBIN A1C
Est. average glucose Bld gHb Est-mCnc: 114 mg/dL
Hgb A1c MFr Bld: 5.6 % (ref 4.8–5.6)

## 2021-05-27 LAB — TSH+FREE T4
Free T4: 1.17 ng/dL (ref 0.82–1.77)
TSH: 1.04 u[IU]/mL (ref 0.450–4.500)

## 2021-05-27 LAB — VITAMIN B12: Vitamin B-12: 660 pg/mL (ref 232–1245)

## 2021-05-27 LAB — VITAMIN D 25 HYDROXY (VIT D DEFICIENCY, FRACTURES): Vit D, 25-Hydroxy: 61.6 ng/mL (ref 30.0–100.0)

## 2021-05-27 LAB — T3: T3, Total: 116 ng/dL (ref 71–180)

## 2021-05-27 LAB — INSULIN, RANDOM: INSULIN: 8.2 u[IU]/mL (ref 2.6–24.9)

## 2021-05-27 NOTE — Progress Notes (Signed)
Chief Complaint:   OBESITY Anita Randolph is here to discuss her progress with her obesity treatment plan along with follow-up of her obesity related diagnoses. Anita Randolph is on keeping a food journal and adhering to recommended goals of 1100-1200 calories and 85 g protein and states she is following her eating plan approximately 50% of the time. Anita Randolph states she is walking and gym exercises 60 minutes 5 times per week.  Today's visit was #: 21 Starting weight: 168 lbs Starting date: 02/19/2020 Today's weight: 152 lbs Today's date: 05/26/2021 Total lbs lost to date: 16 Total lbs lost since last in-office visit: 0  Interim History: Anita Randolph has maintained her weight at last several OVs. She estimates to consume 85 g protein 1 day out of 5 day span. Of Note: previously on anti-hypertensive Rx 15 years ago- ? Name of Rx.  Subjective:   1. Vitamin D deficiency She is currently taking OTC vitamin D 1,000 IU each day. She denies nausea, vomiting or muscle weakness.  Lab Results  Component Value Date   VD25OH 61.6 05/26/2021   VD25OH 50.3 05/28/2020   VD25OH 27.4 (L) 01/05/2016   2. Mixed hyperlipidemia Anita Randolph is on Lipitor 40 mg QD. She denies myalgias.   Lab Results  Component Value Date   ALT 19 05/26/2021   AST 26 05/26/2021   ALKPHOS 68 05/26/2021   BILITOT 0.6 05/26/2021   Lab Results  Component Value Date   CHOL 186 05/26/2021   HDL 68 05/26/2021   LDLCALC 103 (H) 05/26/2021   TRIG 81 05/26/2021   CHOLHDL 2.7 05/26/2021   3. Stage 3a chronic kidney disease (HCC) BP stable. Anita Randolph reports taking anti-hypertensive therapy over 15 years ago but is unsure of the name of medication.    BP Readings from Last 3 Encounters:  05/26/21 136/79  03/09/21 116/74  01/21/21 115/75   4. Other fatigue Anita Randolph reports slight increase in fatigue. She has never been on levothyroxine.   5. Pre-diabetes Anita Randolph is not on any BG lowering prescriptions.  Lab Results  Component Value Date   HGBA1C  5.6 05/26/2021   Lab Results  Component Value Date   INSULIN 8.2 05/26/2021   INSULIN 9.2 12/23/2020   INSULIN 8.4 09/17/2020   INSULIN 8.9 05/28/2020   INSULIN 15.0 02/19/2020   6. B12 nutritional deficiency Anita Randolph is not on any oral or injectable B12 therapy.  Assessment/Plan:   1. Vitamin D deficiency Low Vitamin D level contributes to fatigue and are associated with obesity, breast, and colon cancer. She agrees to continue to take OTC Vitamin D @1 ,000 IU QD and will follow-up for routine testing of Vitamin D, at least 2-3 times per year to avoid over-replacement.  Check labs today. - VITAMIN D 25 Hydroxy (Vit-D Deficiency, Fractures)  2. Mixed hyperlipidemia Cardiovascular risk and specific lipid/LDL goals reviewed.  We discussed several lifestyle modifications today and Anita Randolph will continue to work on diet, exercise and weight loss efforts. Orders and follow up as documented in patient record.   Counseling Intensive lifestyle modifications are the first line treatment for this issue. Dietary changes: Increase soluble fiber. Decrease simple carbohydrates. Exercise changes: Moderate to vigorous-intensity aerobic activity 150 minutes per week if tolerated. Lipid-lowering medications: see documented in medical record.  Check labs today. - Lipid panel  3. Stage 3a chronic kidney disease (Anita Randolph) Lab results and trends reviewed. We discussed several lifestyle modifications today and she will continue to work on diet, exercise and weight loss efforts. Avoid nephrotoxic medications.  Orders and follow up as documented in patient record.   Counseling Chronic kidney disease (CKD) happens when the kidneys are damaged over a long period of time. Most of the time, this condition does not go away, but it can usually be controlled. Steps must be taken to slow down the kidney damage or to stop it from getting worse. Intensive lifestyle modifications are the first line treatment for this issue.   Avoid buying foods that are: processed, frozen, or prepackaged to avoid excess salt.  Check labs today. - Comprehensive metabolic panel  4. Other fatigue Anita Randolph does feel that her weight is causing her energy to be lower than it should be. Fatigue may be related to obesity, depression or many other causes. Labs will be ordered, and in the meanwhile, Anita Randolph will focus on self care including making healthy food choices, increasing physical activity and focusing on stress reduction.  Check labs today. - TSH + free T4 - T3  5. Pre-diabetes Anita Randolph will continue to work on weight loss, exercise, and decreasing simple carbohydrates to help decrease the risk of diabetes.   Check labs today. - Hemoglobin A1c - Insulin, random  6. B12 nutritional deficiency The diagnosis was reviewed with the patient. Counseling provided today, see below. We will continue to monitor. Orders and follow up as documented in patient record.  Counseling The body needs vitamin B12: to make red blood cells; to make DNA; and to help the nerves work properly so they can carry messages from the brain to the body.  The main causes of vitamin B12 deficiency include dietary deficiency, digestive diseases, pernicious anemia, and having a surgery in which part of the stomach or small intestine is removed.  Certain medicines can make it harder for the body to absorb vitamin B12. These medicines include: heartburn medications; some antibiotics; some medications used to treat diabetes, gout, and high cholesterol.  In some cases, there are no symptoms of this condition. If the condition leads to anemia or nerve damage, various symptoms can occur, such as weakness or fatigue, shortness of breath, and numbness or tingling in your hands and feet.   Treatment:  May include taking vitamin B12 supplements.  Avoid alcohol.  Eat lots of healthy foods that contain vitamin B12: Beef, pork, chicken, [place], and organ meats, such as liver.   Seafood: This includes clams, rainbow trout, salmon, tuna, and haddock. Eggs.  Cereal and dairy products that are fortified: This means that vitamin B12 has been added to the food.  Check labs today. - Vitamin B12  7. Obesity with current BMI 27.9  Anita Randolph is currently in the action stage of change. As such, her goal is to continue with weight loss efforts. She has agreed to keeping a food journal and adhering to recommended goals of 1100-1200 calories and 85 g protein.   Exercise goals:  As is  Behavioral modification strategies: increasing lean protein intake, decreasing simple carbohydrates, meal planning and cooking strategies, keeping healthy foods in the home, planning for success, and keeping a strict food journal.  Anita Randolph has agreed to follow-up with our clinic in 3 weeks with Anita Randolph. She was informed of the importance of frequent follow-up visits to maximize her success with intensive lifestyle modifications for her multiple health conditions.   Anita Randolph was informed we would discuss her lab results at her next visit unless there is a critical issue that needs to be addressed sooner. Anita Randolph agreed to keep her next visit at the agreed upon time to  discuss these results.  Objective:   Blood pressure 136/79, pulse 86, temperature 97.9 F (36.6 C), height 5\' 2"  (1.575 m), weight 152 lb (68.9 kg), last menstrual period 11/28/2004, SpO2 97 %. Body mass index is 27.8 kg/m.  General: Cooperative, alert, well developed, in no acute distress. HEENT: Conjunctivae and lids unremarkable. Cardiovascular: Regular rhythm.  Lungs: Normal work of breathing. Neurologic: No focal deficits.   Lab Results  Component Value Date   CREATININE 1.15 (H) 05/26/2021   BUN 21 05/26/2021   NA 140 05/26/2021   K 4.4 05/26/2021   CL 101 05/26/2021   CO2 24 05/26/2021   Lab Results  Component Value Date   ALT 19 05/26/2021   AST 26 05/26/2021   ALKPHOS 68 05/26/2021   BILITOT 0.6 05/26/2021   Lab  Results  Component Value Date   HGBA1C 5.6 05/26/2021   HGBA1C 5.8 (H) 12/23/2020   HGBA1C 5.6 09/17/2020   HGBA1C 5.7 (H) 05/28/2020   Lab Results  Component Value Date   INSULIN 8.2 05/26/2021   INSULIN 9.2 12/23/2020   INSULIN 8.4 09/17/2020   INSULIN 8.9 05/28/2020   INSULIN 15.0 02/19/2020   Lab Results  Component Value Date   TSH 1.040 05/26/2021   Lab Results  Component Value Date   CHOL 186 05/26/2021   HDL 68 05/26/2021   LDLCALC 103 (H) 05/26/2021   TRIG 81 05/26/2021   CHOLHDL 2.7 05/26/2021   Lab Results  Component Value Date   VD25OH 61.6 05/26/2021   VD25OH 50.3 05/28/2020   VD25OH 27.4 (L) 01/05/2016   Lab Results  Component Value Date   WBC 8.5 10/10/2020   HGB 14.8 10/10/2020   HCT 44.8 10/10/2020   MCV 84.2 10/10/2020   PLT 274 10/10/2020   No results found for: IRON, TIBC, FERRITIN  Obesity Behavioral Intervention:   Approximately 15 minutes were spent on the discussion below.  ASK: We discussed the diagnosis of obesity with Anita Randolph today and Anita Randolph agreed to give [place] permission to discuss obesity behavioral modification therapy today.  ASSESS: Anita Randolph has the diagnosis of obesity and her BMI today is 27.9. Anita Randolph is in the action stage of change.   ADVISE: Anita Randolph was educated on the multiple health risks of obesity as well as the benefit of weight loss to improve her health. She was advised of the need for long term treatment and the importance of lifestyle modifications to improve her current health and to decrease her risk of future health problems.  AGREE: Multiple dietary modification options and treatment options were discussed and Anita Randolph agreed to follow the recommendations documented in the above note.  ARRANGE: Anita Randolph was educated on the importance of frequent visits to treat obesity as outlined per CMS and USPSTF guidelines and agreed to schedule her next follow up appointment today.  Attestation Statements:   Reviewed by clinician on  day of visit: allergies, medications, problem list, medical history, surgical history, family history, social history, and previous encounter notes.  Anita Randolph, CMA, am acting as transcriptionist for Mina Marble, NP.  I have reviewed the above documentation for accuracy and completeness, and I agree with the above. -  Anita Randolph d. Previn Jian, NP-C

## 2021-06-02 DIAGNOSIS — R109 Unspecified abdominal pain: Secondary | ICD-10-CM | POA: Diagnosis not present

## 2021-06-12 DIAGNOSIS — Z20822 Contact with and (suspected) exposure to covid-19: Secondary | ICD-10-CM | POA: Diagnosis not present

## 2021-06-16 ENCOUNTER — Other Ambulatory Visit: Payer: Self-pay

## 2021-06-16 ENCOUNTER — Telehealth (INDEPENDENT_AMBULATORY_CARE_PROVIDER_SITE_OTHER): Payer: Medicare Other | Admitting: Family Medicine

## 2021-06-16 ENCOUNTER — Encounter (INDEPENDENT_AMBULATORY_CARE_PROVIDER_SITE_OTHER): Payer: Self-pay | Admitting: Family Medicine

## 2021-06-16 DIAGNOSIS — E669 Obesity, unspecified: Secondary | ICD-10-CM

## 2021-06-16 DIAGNOSIS — Z6830 Body mass index (BMI) 30.0-30.9, adult: Secondary | ICD-10-CM | POA: Diagnosis not present

## 2021-06-16 DIAGNOSIS — R7303 Prediabetes: Secondary | ICD-10-CM | POA: Diagnosis not present

## 2021-06-16 DIAGNOSIS — N1831 Chronic kidney disease, stage 3a: Secondary | ICD-10-CM | POA: Diagnosis not present

## 2021-06-22 NOTE — Progress Notes (Signed)
TeleHealth Visit:  Due to the COVID-19 pandemic, this visit was completed with telemedicine (audio/video) technology to reduce patient and provider exposure as well as to preserve personal protective equipment.   Niya has verbally consented to this TeleHealth visit. The patient is located at home, the provider is located at the [facility] office. The participants in this visit include the listed provider and patient. The visit was conducted today via video.   Chief Complaint: OBESITY Anita Randolph is here to discuss her progress with her obesity treatment plan along with follow-up of her obesity related diagnoses. Anita Randolph is on keeping a food journal and adhering to recommended goals of 1100-1200 calories and 85 g protein and states she is following her eating plan approximately 50% of the time. Anita Randolph states she is walking and weight training 60 minutes 2 times per week.  Today's visit was #: 22 Starting weight: 168 lbs Starting date: 02/19/2020  Interim History: Anita Randolph's goal weight is 145 lbs. She has been off plan due to illness recently. She struggles with getting in the prescribed protein. She does use a protein shake some days to achieve protein goals. She started weight training a few months ago. She is a member at [employer].  Subjective:   1. Chronic kidney disease, 3a Stable. Her GFR 52 and has been stable since January 22.  Lab Results  Component Value Date   CREATININE 1.15 (H) 05/26/2021   CREATININE 1.09 (H) 12/23/2020   CREATININE 1.38 (H) 10/10/2020   Lab Results  Component Value Date   CREATININE 1.15 (H) 05/26/2021   BUN 21 05/26/2021   NA 140 05/26/2021   K 4.4 05/26/2021   CL 101 05/26/2021   CO2 24 05/26/2021   2. Pre-diabetes Anita Randolph's A1c has improved to 5.6 from 5.8. She is not on Metformin.   Lab Results  Component Value Date   HGBA1C 5.6 05/26/2021   Lab Results  Component Value Date   INSULIN 8.2 05/26/2021   INSULIN 9.2 12/23/2020    INSULIN 8.4 09/17/2020   INSULIN 8.9 05/28/2020   INSULIN 15.0 02/19/2020    Assessment/Plan:   1. Chronic kidney disease, 3a Continue to avoid NSAIDs and drink plenty of water. Lab results and trends reviewed. We discussed several lifestyle modifications today and she will continue to work on diet, exercise and weight loss efforts. Avoid nephrotoxic medications. Orders and follow up as documented in patient record.   Counseling Chronic kidney disease (CKD) happens when the kidneys are damaged over a long period of time. Most of the time, this condition does not go away, but it can usually be controlled. Steps must be taken to slow down the kidney damage or to stop it from getting worse. Intensive lifestyle modifications are the first line treatment for this issue.  Avoid buying foods that are: processed, frozen, or prepackaged to avoid excess salt.  2. Pre-diabetes Anita Randolph will continue exercise and meal plan.   3. Obesity with current BMI 27.9  Anita Randolph is currently in the action stage of change. As such, her goal is to continue with weight loss efforts. She has agreed to keeping a food journal and adhering to recommended goals of 1100-1200 calories and 85 g protein.   Discussed that current weight is acceptable if she chooses not to continue to lose weight.  Exercise goals: For substantial health benefits, adults should do at least 150 minutes (2 hours and 30 minutes) a week of moderate-intensity, or 75 minutes (1 hour and 15  minutes) a week of vigorous-intensity aerobic physical activity, or an equivalent combination of moderate- and vigorous-intensity aerobic activity. Aerobic activity should be performed in episodes of at least 10 minutes, and preferably, it should be spread throughout the week. Resistance training 2 times a week.  Behavioral modification strategies: better snacking choices and planning for success.  Anita Randolph has agreed to follow-up with our clinic in 2 weeks. She was  informed of the importance of frequent follow-up visits to maximize her success with intensive lifestyle modifications for her multiple health conditions.  Objective:   VITALS: Per patient if applicable, see vitals. GENERAL: Alert and in no acute distress. CARDIOPULMONARY: No increased WOB. Speaking in clear sentences.  PSYCH: Pleasant and cooperative. Speech normal rate and rhythm. Affect is appropriate. Insight and judgement are appropriate. Attention is focused, linear, and appropriate.  NEURO: Oriented as arrived to appointment on time with no prompting.   Lab Results  Component Value Date   CREATININE 1.15 (H) 05/26/2021   BUN 21 05/26/2021   NA 140 05/26/2021   K 4.4 05/26/2021   CL 101 05/26/2021   CO2 24 05/26/2021   Lab Results  Component Value Date   ALT 19 05/26/2021   AST 26 05/26/2021   ALKPHOS 68 05/26/2021   BILITOT 0.6 05/26/2021   Lab Results  Component Value Date   HGBA1C 5.6 05/26/2021   HGBA1C 5.8 (H) 12/23/2020   HGBA1C 5.6 09/17/2020   HGBA1C 5.7 (H) 05/28/2020   Lab Results  Component Value Date   INSULIN 8.2 05/26/2021   INSULIN 9.2 12/23/2020   INSULIN 8.4 09/17/2020   INSULIN 8.9 05/28/2020   INSULIN 15.0 02/19/2020   Lab Results  Component Value Date   TSH 1.040 05/26/2021   Lab Results  Component Value Date   CHOL 186 05/26/2021   HDL 68 05/26/2021   LDLCALC 103 (H) 05/26/2021   TRIG 81 05/26/2021   CHOLHDL 2.7 05/26/2021   Lab Results  Component Value Date   VD25OH 61.6 05/26/2021   VD25OH 50.3 05/28/2020   VD25OH 27.4 (L) 01/05/2016   Lab Results  Component Value Date   WBC 8.5 10/10/2020   HGB 14.8 10/10/2020   HCT 44.8 10/10/2020   MCV 84.2 10/10/2020   PLT 274 10/10/2020   No results found for: IRON, TIBC, FERRITIN  Attestation Statements:   Reviewed by clinician on day of visit: allergies, medications, problem list, medical history, surgical history, family history, social history, and previous encounter  notes.  Coral Ceo, CMA, am acting as Location manager for [employer], [place].  I have reviewed the above documentation for accuracy and completeness, and I agree with the above. - Georgianne Fick, FNP

## 2021-06-27 ENCOUNTER — Other Ambulatory Visit (INDEPENDENT_AMBULATORY_CARE_PROVIDER_SITE_OTHER): Payer: Self-pay | Admitting: Adult Health

## 2021-06-28 NOTE — Telephone Encounter (Signed)
Anita Randolph 

## 2021-07-01 ENCOUNTER — Other Ambulatory Visit: Payer: Self-pay

## 2021-07-01 ENCOUNTER — Encounter (INDEPENDENT_AMBULATORY_CARE_PROVIDER_SITE_OTHER): Payer: Self-pay | Admitting: Adult Health

## 2021-07-01 ENCOUNTER — Ambulatory Visit (INDEPENDENT_AMBULATORY_CARE_PROVIDER_SITE_OTHER): Payer: Medicare Other | Admitting: Adult Health

## 2021-07-01 VITALS — BP 126/81 | HR 88 | Temp 97.8°F | Ht 62.0 in | Wt 154.0 lb

## 2021-07-01 DIAGNOSIS — E669 Obesity, unspecified: Secondary | ICD-10-CM | POA: Diagnosis not present

## 2021-07-01 DIAGNOSIS — Z6830 Body mass index (BMI) 30.0-30.9, adult: Secondary | ICD-10-CM | POA: Diagnosis not present

## 2021-07-01 DIAGNOSIS — N1831 Chronic kidney disease, stage 3a: Secondary | ICD-10-CM | POA: Diagnosis not present

## 2021-07-01 DIAGNOSIS — E782 Mixed hyperlipidemia: Secondary | ICD-10-CM

## 2021-07-01 MED ORDER — ATORVASTATIN CALCIUM 40 MG PO TABS: 40.0000 mg | ORAL_TABLET | Freq: Every day | ORAL | 0 refills | Status: DC

## 2021-07-06 NOTE — Progress Notes (Signed)
Chief Complaint:   OBESITY Anita Randolph is here to discuss her progress with her obesity treatment plan along with follow-up of her obesity related diagnoses. Anita Randolph is on keeping a food journal and adhering to recommended goals of 1100-1200 calories and 85 grams of protein daily and states she is following her eating plan approximately 50% of the time. Anita Randolph states she is doing cardio for 60 minutes 5 times per week.  Today's visit was #: 23 Starting weight: 168 lbs Starting date: 02/19/2020 Today's weight: 154 lbs Today's date: 07/01/2021 Total lbs lost to date: 14 Total lbs lost since last in-office visit: 0  Interim History: Anita Randolph's daughter's wedding is in the next month in [place]. She has been mindful of meal portions and choosing the healthiest food options when traveling. She will walk quite frequently and at length when in the [place].  Subjective:   1. Mixed hyperlipidemia Anita Randolph's last CMP and LFTs were within normal limits on 05/23/2021. Her lipid panel is stable.  2. Stage 3a chronic kidney disease (HCC) Anita Randolph's CMP and creatinine was 1.15, and GFR of 52, stable.   Assessment/Plan:   1. Mixed hyperlipidemia Cardiovascular risk and specific lipid/LDL goals reviewed. We discussed several lifestyle modifications today. We will refill atorvastatin 40 mg for 90 days with no refills. Anita Randolph will continue to work on diet, exercise and weight loss efforts. Orders and follow up as documented in patient record.   Counseling Intensive lifestyle modifications are the first line treatment for this issue. Dietary changes: Increase soluble fiber. Decrease simple carbohydrates. Exercise changes: Moderate to vigorous-intensity aerobic activity 150 minutes per week if tolerated. Lipid-lowering medications: see documented in medical record.  - atorvastatin (LIPITOR) 40 MG tablet; Take 1 tablet (40 mg total) by mouth daily.  Dispense: 90 tablet; Refill: 0  2. Stage 3a chronic kidney  disease ([place]) Anita Randolph is to avoid NSAIDS.   3. Obesity with current BMI 28.2 Anita Randolph is currently in the action stage of change. As such, her goal is to continue with weight loss efforts. She has agreed to keeping a food journal and adhering to recommended goals of 1100-1200 calories and 85 grams of protein daily.   Exercise goals: As is.  Behavioral modification strategies: increasing lean protein intake, decreasing simple carbohydrates, meal planning and cooking strategies, keeping healthy foods in the home, planning for success, and keeping a strict food journal.  Anita Randolph has agreed to follow-up with our clinic in 3 weeks. She was informed of the importance of frequent follow-up visits to maximize her success with intensive lifestyle modifications for her multiple health conditions.   Objective:   Blood pressure 126/81, pulse 88, temperature 97.8 F (36.6 C), height '5\' 2"'$  (1.575 m), weight 154 lb (69.9 kg), last menstrual period 11/28/2004, SpO2 97 %. Body mass index is 28.17 kg/m.  General: Cooperative, alert, well developed, in no acute distress. HEENT: Conjunctivae and lids unremarkable. Cardiovascular: Regular rhythm.  Lungs: Normal work of breathing. Neurologic: No focal deficits.   Lab Results  Component Value Date   CREATININE 1.15 (H) 05/26/2021   BUN 21 05/26/2021   NA 140 05/26/2021   K 4.4 05/26/2021   CL 101 05/26/2021   CO2 24 05/26/2021   Lab Results  Component Value Date   ALT 19 05/26/2021   AST 26 05/26/2021   ALKPHOS 68 05/26/2021   BILITOT 0.6 05/26/2021   Lab Results  Component Value Date   HGBA1C 5.6 05/26/2021   HGBA1C 5.8 (H) 12/23/2020  HGBA1C 5.6 09/17/2020   HGBA1C 5.7 (H) 05/28/2020   Lab Results  Component Value Date   INSULIN 8.2 05/26/2021   INSULIN 9.2 12/23/2020   INSULIN 8.4 09/17/2020   INSULIN 8.9 05/28/2020   INSULIN 15.0 02/19/2020   Lab Results  Component Value Date   TSH 1.040 05/26/2021   Lab Results  Component Value  Date   CHOL 186 05/26/2021   HDL 68 05/26/2021   LDLCALC 103 (H) 05/26/2021   TRIG 81 05/26/2021   CHOLHDL 2.7 05/26/2021   Lab Results  Component Value Date   VD25OH 61.6 05/26/2021   VD25OH 50.3 05/28/2020   VD25OH 27.4 (L) 01/05/2016   Lab Results  Component Value Date   WBC 8.5 10/10/2020   HGB 14.8 10/10/2020   HCT 44.8 10/10/2020   MCV 84.2 10/10/2020   PLT 274 10/10/2020   No results found for: IRON, TIBC, FERRITIN  Obesity Behavioral Intervention:   Approximately 15 minutes were spent on the discussion below.  ASK: We discussed the diagnosis of obesity with Anita Randolph today and Anita Randolph agreed to give [place] permission to discuss obesity behavioral modification therapy today.  ASSESS: Anita Randolph has the diagnosis of obesity and her BMI today is 28.16. Anita Randolph is in the action stage of change.   ADVISE: Anita Randolph was educated on the multiple health risks of obesity as well as the benefit of weight loss to improve her health. She was advised of the need for long term treatment and the importance of lifestyle modifications to improve her current health and to decrease her risk of future health problems.  AGREE: Multiple dietary modification options and treatment options were discussed and Anita Randolph agreed to follow the recommendations documented in the above note.  ARRANGE: Anita Randolph was educated on the importance of frequent visits to treat obesity as outlined per CMS and USPSTF guidelines and agreed to schedule her next follow up appointment today.  Attestation Statements:   Reviewed by clinician on day of visit: allergies, medications, problem list, medical history, surgical history, family history, social history, and previous encounter notes.   Wilhemena Durie, am acting as transcriptionist for Mina Marble, NP.  I have reviewed the above documentation for accuracy and completeness, and I agree with the above. -  Jahzier Villalon d. Elynore Dolinski, NP-C

## 2021-07-12 ENCOUNTER — Other Ambulatory Visit: Payer: Self-pay

## 2021-07-12 ENCOUNTER — Ambulatory Visit
Admission: RE | Admit: 2021-07-12 | Discharge: 2021-07-12 | Disposition: A | Discharge status: Home / Self Care | Payer: Medicare Other | Source: Ambulatory Visit | Attending: Obstetrics and Gynecology | Admitting: Obstetrics and Gynecology

## 2021-07-12 DIAGNOSIS — Z1231 Encounter for screening mammogram for malignant neoplasm of breast: Secondary | ICD-10-CM | POA: Diagnosis not present

## 2021-07-21 ENCOUNTER — Other Ambulatory Visit: Payer: Self-pay

## 2021-07-21 ENCOUNTER — Ambulatory Visit (INDEPENDENT_AMBULATORY_CARE_PROVIDER_SITE_OTHER): Payer: Medicare Other | Admitting: Adult Health

## 2021-07-21 ENCOUNTER — Encounter (INDEPENDENT_AMBULATORY_CARE_PROVIDER_SITE_OTHER): Payer: Self-pay | Admitting: Adult Health

## 2021-07-21 VITALS — BP 117/79 | HR 76 | Temp 97.5°F | Ht 62.0 in | Wt 152.0 lb

## 2021-07-21 DIAGNOSIS — E669 Obesity, unspecified: Secondary | ICD-10-CM | POA: Diagnosis not present

## 2021-07-21 DIAGNOSIS — R7303 Prediabetes: Secondary | ICD-10-CM

## 2021-07-21 DIAGNOSIS — Z6830 Body mass index (BMI) 30.0-30.9, adult: Secondary | ICD-10-CM | POA: Diagnosis not present

## 2021-07-22 NOTE — Progress Notes (Signed)
Chief Complaint:   OBESITY Anita Randolph is here to discuss her progress with her obesity treatment plan along with follow-up of her obesity related diagnoses. Anita Randolph is on keeping a food journal and adhering to recommended goals of 1100-1200 calories and 85 grams of protein and states she is following her eating plan approximately 70% of the time. Anita Randolph states she is walking/lifting weights for 60 minutes 5 times per week.  Today's visit was #: 24 Starting weight: 168 lbs Starting date: 02/19/2020 Today's weight: 152 lbs Today's date: 07/21/2021 Total lbs lost to date: 16 lbs Total lbs lost since last in-office visit: 2 lbs  Interim History: Anita Randolph's daughter's wedding is on 08/07/2021.  She found the perfect gown - confirmed by the picture she shared with me!  She has really focused on the plan and regular exercises.   Down another 2 lbs, current BMI 27.9 Reviewed body composition with patient.  Subjective:   1. Prediabetes ON 05/21/2021, BG 95, A1c 5.6, insulin 8.2 - all improving.   She is not on any BG lowering medications.  She has really increased protein intake at each meal.  Lab Results  Component Value Date   HGBA1C 5.6 05/26/2021   Lab Results  Component Value Date   INSULIN 8.2 05/26/2021   INSULIN 9.2 12/23/2020   INSULIN 8.4 09/17/2020   INSULIN 8.9 05/28/2020   INSULIN 15.0 02/19/2020   Assessment/Plan:   1. Prediabetes Anita Randolph will continue to work on weight loss, exercise, and decreasing simple carbohydrates to help decrease the risk of diabetes.   Check labs at end of September 2022.  2. Obesity with current BMI 27.9  Anita Randolph is currently in the action stage of change. As such, her goal is to continue with weight loss efforts. She has agreed to keeping a food journal and adhering to recommended goals of 1100-1200 calories and 85 grams of protein.   Exercise goals:  As is.  Behavioral modification strategies: increasing lean protein intake, decreasing simple  carbohydrates, meal planning and cooking strategies, keeping healthy foods in the home, planning for success, and keeping a strict food journal.  Handout:  High Protein/Low Calorie.  Protein Content of Foods.  Anita Randolph has agreed to follow-up with our clinic in 3 weeks. She was informed of the importance of frequent follow-up visits to maximize her success with intensive lifestyle modifications for her multiple health conditions.   Objective:   Blood pressure 117/79, pulse 76, temperature (!) 97.5 F (36.4 C), height '5\' 2"'$  (1.575 m), weight 152 lb (68.9 kg), last menstrual period 11/28/2004, SpO2 98 %. Body mass index is 27.8 kg/m.  General: Cooperative, alert, well developed, in no acute distress. HEENT: Conjunctivae and lids unremarkable. Cardiovascular: Regular rhythm.  Lungs: Normal work of breathing. Neurologic: No focal deficits.   Lab Results  Component Value Date   CREATININE 1.15 (H) 05/26/2021   BUN 21 05/26/2021   NA 140 05/26/2021   K 4.4 05/26/2021   CL 101 05/26/2021   CO2 24 05/26/2021   Lab Results  Component Value Date   ALT 19 05/26/2021   AST 26 05/26/2021   ALKPHOS 68 05/26/2021   BILITOT 0.6 05/26/2021   Lab Results  Component Value Date   HGBA1C 5.6 05/26/2021   HGBA1C 5.8 (H) 12/23/2020   HGBA1C 5.6 09/17/2020   HGBA1C 5.7 (H) 05/28/2020   Lab Results  Component Value Date   INSULIN 8.2 05/26/2021   INSULIN 9.2 12/23/2020   INSULIN 8.4 09/17/2020  INSULIN 8.9 05/28/2020   INSULIN 15.0 02/19/2020   Lab Results  Component Value Date   TSH 1.040 05/26/2021   Lab Results  Component Value Date   CHOL 186 05/26/2021   HDL 68 05/26/2021   LDLCALC 103 (H) 05/26/2021   TRIG 81 05/26/2021   CHOLHDL 2.7 05/26/2021   Lab Results  Component Value Date   VD25OH 61.6 05/26/2021   VD25OH 50.3 05/28/2020   VD25OH 27.4 (L) 01/05/2016   Lab Results  Component Value Date   WBC 8.5 10/10/2020   HGB 14.8 10/10/2020   HCT 44.8 10/10/2020   MCV  84.2 10/10/2020   PLT 274 10/10/2020   Attestation Statements:   Reviewed by clinician on day of visit: allergies, medications, problem list, medical history, surgical history, family history, social history, and previous encounter notes.  Time spent on visit including pre-visit chart review and post-visit care and charting was 28 minutes.   I, Water quality scientist, CMA, am acting as Location manager for Mina Marble, NP.  I have reviewed the above documentation for accuracy and completeness, and I agree with the above. -  Dealie Koelzer d. Linard Daft, NP-C

## 2021-08-10 DIAGNOSIS — U071 COVID-19: Secondary | ICD-10-CM | POA: Diagnosis not present

## 2021-08-12 ENCOUNTER — Telehealth (INDEPENDENT_AMBULATORY_CARE_PROVIDER_SITE_OTHER): Payer: Medicare Other | Admitting: Adult Health

## 2021-08-12 ENCOUNTER — Encounter (INDEPENDENT_AMBULATORY_CARE_PROVIDER_SITE_OTHER): Payer: Self-pay | Admitting: Adult Health

## 2021-08-12 ENCOUNTER — Other Ambulatory Visit: Payer: Self-pay

## 2021-08-12 DIAGNOSIS — Z6830 Body mass index (BMI) 30.0-30.9, adult: Secondary | ICD-10-CM

## 2021-08-12 DIAGNOSIS — E669 Obesity, unspecified: Secondary | ICD-10-CM | POA: Diagnosis not present

## 2021-08-12 DIAGNOSIS — U071 COVID-19: Secondary | ICD-10-CM | POA: Diagnosis not present

## 2021-08-12 NOTE — Telephone Encounter (Signed)
Last seen by Katy 

## 2021-08-12 NOTE — Progress Notes (Signed)
TeleHealth Visit:  Due to the COVID-19 pandemic, this visit was completed with telemedicine (audio/video) technology to reduce patient and provider exposure as well as to preserve personal protective equipment.   Anita Randolph has verbally consented to this TeleHealth visit. The patient is located at home, the provider is located at the [facility] office. The participants in this visit include the listed provider and patient. The visit was conducted today via MyChart video.  Chief Complaint: OBESITY Anita Randolph is here to discuss her progress with her obesity treatment plan along with follow-up of her obesity related diagnoses. Anita Randolph is on keeping a food journal and adhering to recommended goals of 1100-1200 calories and 85 grams of protein and states she is following her eating plan approximately 0% of the time. Anita Randolph states she is not exercising regularly.  Today's visit was #: 25 Starting weight: 168 lbs Starting date: 02/19/2020  Interim History: She and her husband traveled to [facility] for her daughter's wedding- beautiful event! Anita Randolph returned home on 08/09/2021.  Symptoms started on 08/10/2021 - cough, headache, low grade fever.  Positive home antigen COVID-19 test.  Husband tested positive on 08/11/2021.  PCP started her on Paxlovid.  She denies dyspnea, body aches. During the time leading up the wedding/during events- she followed PC/Anita Randolph mindset.  Subjective:   1. COVID-19 She and her husband traveled to [facility] for her daughter's wedding- beautiful event! Anita Randolph returned home on 08/09/2021.  Symptoms started on 08/10/2021 - cough, headache, low grade fever.  Positive home antigen COVID-19 test.  Husband tested positive on 08/11/2021.  PCP started her on Paxlovid.  She denies dyspnea, body aches.  Assessment/Plan:   1. COVID-19 Complete oral antiviral therapy. Follow current CDC guidelines for isolation/quarantine.  2. Obesity with current BMI 27.9  Anita Randolph is currently in the action  stage of change. As such, her goal is to continue with weight loss efforts. She has agreed to keeping a food journal and adhering to recommended goals of 1100-1200 calories and 85 grams of protein.   Exercise goals:  As is.  Behavioral modification strategies: increasing lean protein intake, decreasing simple carbohydrates, meal planning and cooking strategies, keeping healthy foods in the home, and planning for success.  Anita Randolph has agreed to follow-up with our clinic in 4 weeks. She was informed of the importance of frequent follow-up visits to maximize her success with intensive lifestyle modifications for her multiple health conditions.  Objective:   VITALS: Per patient if applicable, see vitals. GENERAL: Alert and in no acute distress. CARDIOPULMONARY: No increased WOB. Speaking in clear sentences.  PSYCH: Pleasant and cooperative. Speech normal rate and rhythm. Affect is appropriate. Insight and judgement are appropriate. Attention is focused, linear, and appropriate.  NEURO: Oriented as arrived to appointment on time with no prompting.   Lab Results  Component Value Date   CREATININE 1.15 (H) 05/26/2021   BUN 21 05/26/2021   NA 140 05/26/2021   K 4.4 05/26/2021   CL 101 05/26/2021   CO2 24 05/26/2021   Lab Results  Component Value Date   ALT 19 05/26/2021   AST 26 05/26/2021   ALKPHOS 68 05/26/2021   BILITOT 0.6 05/26/2021   Lab Results  Component Value Date   HGBA1C 5.6 05/26/2021   HGBA1C 5.8 (H) 12/23/2020   HGBA1C 5.6 09/17/2020   HGBA1C 5.7 (H) 05/28/2020   Lab Results  Component Value Date   INSULIN 8.2 05/26/2021   INSULIN 9.2 12/23/2020   INSULIN 8.4 09/17/2020  INSULIN 8.9 05/28/2020   INSULIN 15.0 02/19/2020   Lab Results  Component Value Date   TSH 1.040 05/26/2021   Lab Results  Component Value Date   CHOL 186 05/26/2021   HDL 68 05/26/2021   LDLCALC 103 (H) 05/26/2021   TRIG 81 05/26/2021   CHOLHDL 2.7 05/26/2021   Lab Results  Component  Value Date   VD25OH 61.6 05/26/2021   VD25OH 50.3 05/28/2020   VD25OH 27.4 (L) 01/05/2016   Lab Results  Component Value Date   WBC 8.5 10/10/2020   HGB 14.8 10/10/2020   HCT 44.8 10/10/2020   MCV 84.2 10/10/2020   PLT 274 10/10/2020   Attestation Statements:   Reviewed by clinician on day of visit: allergies, medications, problem list, medical history, surgical history, family history, social history, and previous encounter notes.  Time spent on visit including pre-visit chart review and post-visit charting and care was 28 minutes.   I, Water quality scientist, CMA, am acting as Location manager for Mina Marble, NP.  I have reviewed the above documentation for accuracy and completeness, and I agree with the above. - Shaquanna Lycan d. Dola Lunsford, NP-C

## 2021-08-18 DIAGNOSIS — Z6827 Body mass index (BMI) 27.0-27.9, adult: Secondary | ICD-10-CM | POA: Diagnosis not present

## 2021-08-18 DIAGNOSIS — Z01419 Encounter for gynecological examination (general) (routine) without abnormal findings: Secondary | ICD-10-CM | POA: Diagnosis not present

## 2021-08-19 DIAGNOSIS — H6122 Impacted cerumen, left ear: Secondary | ICD-10-CM | POA: Diagnosis not present

## 2021-08-19 DIAGNOSIS — Z822 Family history of deafness and hearing loss: Secondary | ICD-10-CM | POA: Diagnosis not present

## 2021-08-19 DIAGNOSIS — H903 Sensorineural hearing loss, bilateral: Secondary | ICD-10-CM | POA: Diagnosis not present

## 2021-08-21 DIAGNOSIS — U071 COVID-19: Secondary | ICD-10-CM | POA: Diagnosis not present

## 2021-09-13 ENCOUNTER — Other Ambulatory Visit: Payer: Self-pay

## 2021-09-13 ENCOUNTER — Encounter (INDEPENDENT_AMBULATORY_CARE_PROVIDER_SITE_OTHER): Payer: Self-pay | Admitting: Adult Health

## 2021-09-13 ENCOUNTER — Ambulatory Visit (INDEPENDENT_AMBULATORY_CARE_PROVIDER_SITE_OTHER): Payer: Medicare Other | Admitting: Adult Health

## 2021-09-13 VITALS — BP 139/80 | HR 69 | Temp 98.1°F | Ht 62.0 in | Wt 152.0 lb

## 2021-09-13 DIAGNOSIS — E559 Vitamin D deficiency, unspecified: Secondary | ICD-10-CM | POA: Diagnosis not present

## 2021-09-13 DIAGNOSIS — R030 Elevated blood-pressure reading, without diagnosis of hypertension: Secondary | ICD-10-CM

## 2021-09-13 DIAGNOSIS — E782 Mixed hyperlipidemia: Secondary | ICD-10-CM

## 2021-09-13 DIAGNOSIS — Z6830 Body mass index (BMI) 30.0-30.9, adult: Secondary | ICD-10-CM

## 2021-09-13 DIAGNOSIS — R7303 Prediabetes: Secondary | ICD-10-CM

## 2021-09-13 DIAGNOSIS — U071 COVID-19: Secondary | ICD-10-CM | POA: Diagnosis not present

## 2021-09-13 DIAGNOSIS — E669 Obesity, unspecified: Secondary | ICD-10-CM

## 2021-09-13 DIAGNOSIS — Z23 Encounter for immunization: Secondary | ICD-10-CM | POA: Diagnosis not present

## 2021-09-13 NOTE — Progress Notes (Signed)
Chief Complaint:   OBESITY Anita Randolph is here to discuss her progress with her obesity treatment plan along with follow-up of her obesity related diagnoses. Anita Randolph is on keeping a food journal and adhering to recommended goals of 1100-1200 calories and 85 grams protein and states she is following her eating plan approximately 50% of the time. Anita Randolph states she is walking 45 minutes 3 times per week.  Today's visit was #: 5 Starting weight: 168 lbs Starting date: 02/19/2020 Today's weight: 152 lbs Today's date: 09/13/2021 Total lbs lost to date: 16 Total lbs lost since last in-office visit: 0  Interim History: Since her last in person OV, Anita Randolph's daughter was married in [place], and she was later diagnosed with COVID-19.  She experienced rebound COVID-19 after completing Paxlovid therapy. She is surprised to have maintained her weight!  Subjective:   1. COVID-19 Symptoms started on 08/10/2021 - cough, headache, low grade fever.  Positive home antigen COVID-19 test.  Treated with Paxlovid therapy, then experienced rebound COVID-19 infection. She currently denies any lingering COVID-19 sx's at present.  2. Elevated blood-pressure reading without diagnosis of hypertension BP elevated at OV.  Anita Randolph has never been on any anti-hypertensive medications. She denies acute cardiac sx's.  3. Mixed hyperlipidemia She is on Lipitor 40 mg QD and denies myalgias.  4. Pre-diabetes Anita Randolph is not on any BG lowering medications.  5. Vitamin D deficiency Pt is on Calcium Carbonate/Vit D- 2 tabs QD and also OTC Vit D3 1,000 IU QD.  Assessment/Plan:   1. COVID-19 Continue to follow 3 W's when needed.  2. Elevated blood-pressure reading without diagnosis of hypertension Anita Randolph is working on healthy weight loss and exercise to improve blood pressure control. We will watch for signs of hypotension as she continues her lifestyle modifications. Monitor BP. Check labs today.  - Comprehensive metabolic  panel  3. Mixed hyperlipidemia Cardiovascular risk and specific lipid/LDL goals reviewed.  We discussed several lifestyle modifications today and Anita Randolph will continue to work on diet, exercise and weight loss efforts. Orders and follow up as documented in patient record.   Counseling Intensive lifestyle modifications are the first line treatment for this issue. Dietary changes: Increase soluble fiber. Decrease simple carbohydrates. Exercise changes: Moderate to vigorous-intensity aerobic activity 150 minutes per week if tolerated. Lipid-lowering medications: see documented in medical record. Check labs today.  - Lipid panel  4. Pre-diabetes Anita Randolph will continue to work on weight loss, exercise, and decreasing simple carbohydrates to help decrease the risk of diabetes. Check labs today.  - Hemoglobin A1c - Insulin, random  5. Vitamin D deficiency Low Vitamin D level contributes to fatigue and are associated with obesity, breast, and colon cancer. She agrees to continue OTC Vitamin D3 1,000 IU QD and Calcium carbonate/Vit D supplement and will follow-up for routine testing of Vitamin D, at least 2-3 times per year to avoid over-replacement. Check labs today.  - VITAMIN D 25 Hydroxy (Vit-D Deficiency, Fractures)  6. Obesity with current BMI 27.9  Anita Randolph is currently in the action stage of change. As such, her goal is to continue with weight loss efforts. She has agreed to keeping a food journal and adhering to recommended goals of 1100-1200 calories and 85 grams protein.   Exercise goals:  As is  Behavioral modification strategies: increasing lean protein intake, decreasing simple carbohydrates, meal planning and cooking strategies, keeping healthy foods in the home, and planning for success.  Anita Randolph has agreed to follow-up with our clinic in 3 weeks.  She was informed of the importance of frequent follow-up visits to maximize her success with intensive lifestyle modifications for her  multiple health conditions.   Anita Randolph was informed we would discuss her lab results at her next visit unless there is a critical issue that needs to be addressed sooner. Anita Randolph agreed to keep her next visit at the agreed upon time to discuss these results.  Objective:   Blood pressure 139/80, pulse 69, temperature 98.1 F (36.7 C), height 5\' 2"  (1.575 m), weight 152 lb (68.9 kg), last menstrual period 11/28/2004, SpO2 96 %. Body mass index is 27.8 kg/m.  General: Cooperative, alert, well developed, in no acute distress. HEENT: Conjunctivae and lids unremarkable. Cardiovascular: Regular rhythm.  Lungs: Normal work of breathing. Neurologic: No focal deficits.   Lab Results  Component Value Date   CREATININE 1.15 (H) 05/26/2021   BUN 21 05/26/2021   NA 140 05/26/2021   K 4.4 05/26/2021   CL 101 05/26/2021   CO2 24 05/26/2021   Lab Results  Component Value Date   ALT 19 05/26/2021   AST 26 05/26/2021   ALKPHOS 68 05/26/2021   BILITOT 0.6 05/26/2021   Lab Results  Component Value Date   HGBA1C 5.6 05/26/2021   HGBA1C 5.8 (H) 12/23/2020   HGBA1C 5.6 09/17/2020   HGBA1C 5.7 (H) 05/28/2020   Lab Results  Component Value Date   INSULIN 8.2 05/26/2021   INSULIN 9.2 12/23/2020   INSULIN 8.4 09/17/2020   INSULIN 8.9 05/28/2020   INSULIN 15.0 02/19/2020   Lab Results  Component Value Date   TSH 1.040 05/26/2021   Lab Results  Component Value Date   CHOL 186 05/26/2021   HDL 68 05/26/2021   LDLCALC 103 (H) 05/26/2021   TRIG 81 05/26/2021   CHOLHDL 2.7 05/26/2021   Lab Results  Component Value Date   VD25OH 61.6 05/26/2021   VD25OH 50.3 05/28/2020   VD25OH 27.4 (L) 01/05/2016   Lab Results  Component Value Date   WBC 8.5 10/10/2020   HGB 14.8 10/10/2020   HCT 44.8 10/10/2020   MCV 84.2 10/10/2020   PLT 274 10/10/2020   No results found for: IRON, TIBC, FERRITIN  Obesity Behavioral Intervention:   Approximately 15 minutes were spent on the discussion  below.  ASK: We discussed the diagnosis of obesity with Anita Randolph today and Anita Randolph agreed to give [place] permission to discuss obesity behavioral modification therapy today.  ASSESS: Anita Randolph has the diagnosis of obesity and her BMI today is 27.8. Anita Randolph is in the action stage of change.   ADVISE: Anita Randolph was educated on the multiple health risks of obesity as well as the benefit of weight loss to improve her health. She was advised of the need for long term treatment and the importance of lifestyle modifications to improve her current health and to decrease her risk of future health problems.  AGREE: Multiple dietary modification options and treatment options were discussed and Anita Randolph agreed to follow the recommendations documented in the above note.  ARRANGE: Anita Randolph was educated on the importance of frequent visits to treat obesity as outlined per CMS and USPSTF guidelines and agreed to schedule her next follow up appointment today.  Attestation Statements:   Reviewed by clinician on day of visit: allergies, medications, problem list, medical history, surgical history, family history, social history, and previous encounter notes.  Coral Ceo, CMA, am acting as transcriptionist for Mina Marble, NP.  I have reviewed the above documentation for accuracy and completeness, and  I agree with the above. -  Braeley Buskey d. Cung Masterson, NP-C

## 2021-09-14 LAB — LIPID PANEL
Chol/HDL Ratio: 2.9 ratio (ref 0.0–4.4)
Cholesterol, Total: 176 mg/dL (ref 100–199)
HDL: 60 mg/dL (ref >39)
LDL Chol Calc (NIH): 94 mg/dL (ref 0–99)
Triglycerides: 127 mg/dL (ref 0–149)
VLDL Cholesterol Cal: 22 mg/dL (ref 5–40)

## 2021-09-14 LAB — INSULIN, RANDOM: INSULIN: 8.9 u[IU]/mL (ref 2.6–24.9)

## 2021-09-14 LAB — VITAMIN D 25 HYDROXY (VIT D DEFICIENCY, FRACTURES): Vit D, 25-Hydroxy: 58.9 ng/mL (ref 30.0–100.0)

## 2021-09-14 LAB — COMPREHENSIVE METABOLIC PANEL
ALT: 18 IU/L (ref 0–32)
AST: 23 IU/L (ref 0–40)
Albumin/Globulin Ratio: 1.5 (ref 1.2–2.2)
Albumin: 4.2 g/dL (ref 3.8–4.8)
Alkaline Phosphatase: 68 IU/L (ref 44–121)
BUN/Creatinine Ratio: 17 (ref 12–28)
BUN: 18 mg/dL (ref 8–27)
Bilirubin Total: 0.6 mg/dL (ref 0.0–1.2)
CO2: 24 mmol/L (ref 20–29)
Calcium: 9.5 mg/dL (ref 8.7–10.3)
Chloride: 103 mmol/L (ref 96–106)
Creatinine, Ser: 1.08 mg/dL — ABNORMAL HIGH (ref 0.57–1.00)
Globulin, Total: 2.8 g/dL (ref 1.5–4.5)
Glucose: 97 mg/dL (ref 70–99)
Potassium: 4.6 mmol/L (ref 3.5–5.2)
Sodium: 137 mmol/L (ref 134–144)
Total Protein: 7 g/dL (ref 6.0–8.5)
eGFR: 56 mL/min/{1.73_m2} — ABNORMAL LOW (ref >59)

## 2021-09-14 LAB — HEMOGLOBIN A1C
Est. average glucose Bld gHb Est-mCnc: 123 mg/dL
Hgb A1c MFr Bld: 5.9 % — ABNORMAL HIGH (ref 4.8–5.6)

## 2021-09-15 ENCOUNTER — Other Ambulatory Visit: Payer: Self-pay | Admitting: Nurse Practitioner

## 2021-09-26 ENCOUNTER — Other Ambulatory Visit (INDEPENDENT_AMBULATORY_CARE_PROVIDER_SITE_OTHER): Payer: Self-pay | Admitting: Adult Health

## 2021-09-26 DIAGNOSIS — E782 Mixed hyperlipidemia: Secondary | ICD-10-CM

## 2021-09-27 NOTE — Telephone Encounter (Signed)
Pt last seen by Katy Danford, FNP.  

## 2021-09-27 NOTE — Telephone Encounter (Signed)
LAST APPOINTMENT DATE: 09/13/21 NEXT APPOINTMENT DATE: 10/13/21   [id number] - [place], [place] [address] [place] [address] Phone: [phone] Fax: [phone]  [place] [address] Phone: [phone] Fax: [phone]  Patient is requesting a refill of the following medications: Pending Prescriptions:                       Disp   Refills   atorvastatin (LIPITOR) 40 MG tablet [Pharm*90 tab*0       Sig: TAKE 1 TABLET BY MOUTH EVERY DAY   Date last filled: 07/01/21 Previously prescribed by [facility]  Lab Results      Component                Value               Date                      HGBA1C                   5.9 (H)             09/13/2021                HGBA1C                   5.6                 05/26/2021                HGBA1C                   5.8 (H)             12/23/2020           Lab Results      Component                Value               Date                      LDLCALC                  94                  09/13/2021                CREATININE               1.08 (H)            09/13/2021           Lab Results      Component                Value               Date                      VD25OH                   58.9                09/13/2021  VD25OH                   61.6                05/26/2021                VD25OH                   50.3                05/28/2020            BP Readings from Last 3 Encounters: 09/13/21 : 139/80 07/21/21 : 117/79 07/01/21 : 126/81

## 2021-10-04 ENCOUNTER — Ambulatory Visit (INDEPENDENT_AMBULATORY_CARE_PROVIDER_SITE_OTHER): Payer: Medicare Other | Admitting: Adult Health

## 2021-10-07 DIAGNOSIS — U071 COVID-19: Secondary | ICD-10-CM | POA: Diagnosis not present

## 2021-10-07 DIAGNOSIS — Z23 Encounter for immunization: Secondary | ICD-10-CM | POA: Diagnosis not present

## 2021-10-13 ENCOUNTER — Ambulatory Visit (INDEPENDENT_AMBULATORY_CARE_PROVIDER_SITE_OTHER): Payer: Medicare Other | Admitting: Adult Health

## 2021-11-15 ENCOUNTER — Encounter (INDEPENDENT_AMBULATORY_CARE_PROVIDER_SITE_OTHER): Payer: Self-pay | Admitting: Adult Health

## 2021-11-15 ENCOUNTER — Ambulatory Visit (INDEPENDENT_AMBULATORY_CARE_PROVIDER_SITE_OTHER): Payer: Medicare Other | Admitting: Adult Health

## 2021-11-15 ENCOUNTER — Other Ambulatory Visit: Payer: Self-pay

## 2021-11-15 VITALS — BP 130/78 | HR 82 | Temp 98.0°F | Ht 62.0 in | Wt 155.0 lb

## 2021-11-15 DIAGNOSIS — E559 Vitamin D deficiency, unspecified: Secondary | ICD-10-CM | POA: Diagnosis not present

## 2021-11-15 DIAGNOSIS — N1831 Chronic kidney disease, stage 3a: Secondary | ICD-10-CM | POA: Diagnosis not present

## 2021-11-15 DIAGNOSIS — R030 Elevated blood-pressure reading, without diagnosis of hypertension: Secondary | ICD-10-CM | POA: Diagnosis not present

## 2021-11-15 DIAGNOSIS — E669 Obesity, unspecified: Secondary | ICD-10-CM

## 2021-11-15 DIAGNOSIS — Z6830 Body mass index (BMI) 30.0-30.9, adult: Secondary | ICD-10-CM | POA: Diagnosis not present

## 2021-11-15 DIAGNOSIS — R7303 Prediabetes: Secondary | ICD-10-CM

## 2021-11-15 DIAGNOSIS — E782 Mixed hyperlipidemia: Secondary | ICD-10-CM | POA: Diagnosis not present

## 2021-11-15 NOTE — Progress Notes (Signed)
Chief Complaint:   OBESITY Anita Randolph is here to discuss her progress with her obesity treatment plan along with follow-up of her obesity related diagnoses. Anita Randolph is on keeping a food journal and adhering to recommended goals of 1100-1200 calories and 85 grams of protein and states she is following her eating plan approximately 20% of the time. Anita Randolph states she is walking/weights for 60 minutes 3 times per week.  Today's visit was #: 65 Starting weight: 168 lbs Starting date: 02/19/2020 Today's weight: 155 lbs Today's date: 11/15/2021 Total lbs lost to date: 13 lbs Total lbs lost since last in-office visit: 0  Interim History:  Reviewed bioimpedance with patient.  Anita Randolph > FM, 93lbs > 57lbs- composition in correct ratio.   Visceral adipose rating -10: at goal!   Husband initially dx'd with prostate cancer since 2017.   Due to ever so slight increase in PSA level, he will undergo radiation treatment in January 2023.  Of Note- will travel to [place] over the next several weeks, then home 06 Dec 2021.  Subjective:   1. Prediabetes Worsening.  Discussed labs with patient today.  On 09/13/2021, A1c was 5.9.  Insulin level 8.9. EPIC review reveals persistent IR and pre-diabetic A1c levels despite consistent lifestyle modifications and weight loss.  2. Elevated blood-pressure reading without diagnosis of hypertension Discussed labs with patient today.  BP/HR stable at office visit. On 09/13/2021, CMP - electrolytes stable.  CKD improved. Never been on antihypertensive.  3. Mixed hyperlipidemia Discussed labs with patient today.  On 09/13/2021, lipid panel - improved/normalized. On 09/13/2021, CMP - LFTs WNL. She is on atorvastatin 40 mg daily - denies myalgias.  4. Vitamin D deficiency Discussed labs with patient today.  Vitamin D level on 09/13/2021 - 58.9 - stable. She is on vitamin D3 1,000 IU daily.  5. Stage 3a chronic kidney disease ([place]) Discussed labs with patient  today.  On 09/13/2021, CMP - creatinine 1.08, GFR 56- both improved from last check.  Assessment/Plan:   1. Prediabetes Check labs today. Consider GLP-1/Ozempic in the future if appropriate. Due to CKD- GLP-1 therapy recommended.  2. Elevated blood-pressure reading without diagnosis of hypertension Consider lisinopril therapy if needed in future.  3. Mixed hyperlipidemia Continue atorvastatin 40 mg daily. Remain well hydrated.  4. Vitamin D deficiency Increase vitamin D3 1,000 IU to 2 capsules daily.  5. Stage 3a chronic kidney disease ([place]) GLP-1 therapy preferred over metformin. Consider low dose lisinopril if BP/CKD worsening. AVOID NSAIDS  6. Obesity with current BMI 28.4  Anita Randolph is currently in the action stage of change. As such, her goal is to continue with weight loss efforts. She has agreed to keeping a food journal and adhering to recommended goals of 1100-1200 calories and 85 grams of protein.   Exercise goals:  As is.  Behavioral modification strategies: increasing lean protein intake, decreasing simple carbohydrates, meal planning and cooking strategies, keeping healthy foods in the home, and planning for success.  Anita Randolph has agreed to follow-up with our clinic in 5 weeks, fasting. She was informed of the importance of frequent follow-up visits to maximize her success with intensive lifestyle modifications for her multiple health conditions.   Objective:   Blood pressure 130/78, pulse 82, temperature 98 F (36.7 C), height 5\' 2"  (1.575 m), weight 155 lb (70.3 kg), last menstrual period 11/28/2004, SpO2 97 %. Body mass index is 28.35 kg/m.  General: Cooperative, alert, well developed, in no acute distress. HEENT: Conjunctivae and lids unremarkable.  Cardiovascular: Regular rhythm.  Lungs: Normal work of breathing. Neurologic: No focal deficits.   Lab Results  Component Value Date   CREATININE 1.08 (H) 09/13/2021   BUN 18 09/13/2021   NA 137 09/13/2021   K  4.6 09/13/2021   CL 103 09/13/2021   CO2 24 09/13/2021   Lab Results  Component Value Date   ALT 18 09/13/2021   AST 23 09/13/2021   ALKPHOS 68 09/13/2021   BILITOT 0.6 09/13/2021   Lab Results  Component Value Date   HGBA1C 5.9 (H) 09/13/2021   HGBA1C 5.6 05/26/2021   HGBA1C 5.8 (H) 12/23/2020   HGBA1C 5.6 09/17/2020   HGBA1C 5.7 (H) 05/28/2020   Lab Results  Component Value Date   INSULIN 8.9 09/13/2021   INSULIN 8.2 05/26/2021   INSULIN 9.2 12/23/2020   INSULIN 8.4 09/17/2020   INSULIN 8.9 05/28/2020   Lab Results  Component Value Date   TSH 1.040 05/26/2021   Lab Results  Component Value Date   CHOL 176 09/13/2021   HDL 60 09/13/2021   LDLCALC 94 09/13/2021   TRIG 127 09/13/2021   CHOLHDL 2.9 09/13/2021   Lab Results  Component Value Date   VD25OH 58.9 09/13/2021   VD25OH 61.6 05/26/2021   VD25OH 50.3 05/28/2020   Lab Results  Component Value Date   WBC 8.5 10/10/2020   HGB 14.8 10/10/2020   HCT 44.8 10/10/2020   MCV 84.2 10/10/2020   PLT 274 10/10/2020   Obesity Behavioral Intervention:   Approximately 15 minutes were spent on the discussion below.  ASK: We discussed the diagnosis of obesity with Anita Randolph today and Anita Randolph agreed to give [place] permission to discuss obesity behavioral modification therapy today.  ASSESS: Anita Randolph has the diagnosis of obesity and her BMI today is 28.4. Anita Randolph is in the action stage of change.   ADVISE: Anita Randolph was educated on the multiple health risks of obesity as well as the benefit of weight loss to improve her health. She was advised of the need for long term treatment and the importance of lifestyle modifications to improve her current health and to decrease her risk of future health problems.  AGREE: Multiple dietary modification options and treatment options were discussed and Anita Randolph agreed to follow the recommendations documented in the above note.  ARRANGE: Anita Randolph was educated on the importance of frequent visits to  treat obesity as outlined per CMS and USPSTF guidelines and agreed to schedule her next follow up appointment today.  Attestation Statements:   Reviewed by clinician on day of visit: allergies, medications, problem list, medical history, surgical history, family history, social history, and previous encounter notes.  I, Water quality scientist, CMA, am acting as Location manager for Mina Marble, NP.  I have reviewed the above documentation for accuracy and completeness, and I agree with the above. -  Elyas Villamor d. Shenea Giacobbe, NP-C

## 2021-12-21 ENCOUNTER — Encounter (INDEPENDENT_AMBULATORY_CARE_PROVIDER_SITE_OTHER): Payer: Self-pay | Admitting: Adult Health

## 2021-12-21 ENCOUNTER — Other Ambulatory Visit: Payer: Self-pay

## 2021-12-21 ENCOUNTER — Ambulatory Visit (INDEPENDENT_AMBULATORY_CARE_PROVIDER_SITE_OTHER): Payer: Medicare Other | Admitting: Adult Health

## 2021-12-21 VITALS — BP 126/63 | HR 75 | Temp 97.5°F | Ht 62.0 in | Wt 154.0 lb

## 2021-12-21 DIAGNOSIS — E559 Vitamin D deficiency, unspecified: Secondary | ICD-10-CM | POA: Diagnosis not present

## 2021-12-21 DIAGNOSIS — E782 Mixed hyperlipidemia: Secondary | ICD-10-CM | POA: Diagnosis not present

## 2021-12-21 DIAGNOSIS — R7303 Prediabetes: Secondary | ICD-10-CM | POA: Diagnosis not present

## 2021-12-21 DIAGNOSIS — Z6828 Body mass index (BMI) 28.0-28.9, adult: Secondary | ICD-10-CM | POA: Diagnosis not present

## 2021-12-21 DIAGNOSIS — E669 Obesity, unspecified: Secondary | ICD-10-CM | POA: Diagnosis not present

## 2021-12-21 NOTE — Progress Notes (Signed)
Chief Complaint:   OBESITY Anita Randolph is here to discuss her progress with her obesity treatment plan along with follow-up of her obesity related diagnoses. Anita Randolph is on keeping a food journal and adhering to recommended goals of 1100-1200 calories and 85 grams of protein and states she is following her eating plan approximately 50% of the time. Anita Randolph states she is walking/lifting weights for 60 minutes 5 times per week.  Today's visit was #: 28 Starting weight: 168 lbs Starting date: 02/19/2020 Today's weight: 154 lbs Today's date: 12/21/2021 Total lbs lost to date: 14 lbs Total lbs lost since last in-office visit: 1 lb  Interim History:  Anita Randolph says she is "home for a few months and ready to focus on meal planning/prepping". Reviewed with patient: FM 55 lbs, MM 93 lbs;  MM > FM;  93 lbs > 55 lbs. Visceral adipose rating - 10 - at goal!  Subjective:   1. Vitamin D deficiency She is on calcium carbonate - vitamin D 600/400.  2. Prediabetes She is not on any BG lowering medications. She denies polyphagia.  3. Mixed hyperlipidemia She is on atorvastatin 40 mg daily - denies myalgias. Sig family hx: Father/brother/her son - hyperlipidemia. Mother - hypertension.  Assessment/Plan:   1. Vitamin D deficiency Check labs today.  - VITAMIN D 25 Hydroxy (Vit-D Deficiency, Fractures)  2. Prediabetes Check labs today.  - Hemoglobin A1c - Insulin, random  3. Mixed hyperlipidemia Check labs today.  - Comprehensive metabolic panel - Lipid panel  4. Obesity with current BMI 28.2  Anita Randolph is currently in the action stage of change. As such, her goal is to continue with weight loss efforts. She has agreed to keeping a food journal and adhering to recommended goals of 1100-1200 calories and 85 grams of protein.   Check IC at net office visit.  Patient aware to arrive 30 minutes prior to office visit.  Exercise goals:  As is.  Behavioral modification strategies: increasing  lean protein intake, decreasing simple carbohydrates, meal planning and cooking strategies, keeping healthy foods in the home, and planning for success.  Anita Randolph has agreed to follow-up with our clinic in 2-3 weeks. She was informed of the importance of frequent follow-up visits to maximize her success with intensive lifestyle modifications for her multiple health conditions.   Objective:   Blood pressure 126/63, pulse 75, temperature (!) 97.5 F (36.4 C), height 5\' 2"  (1.575 m), weight 154 lb (69.9 kg), last menstrual period 11/28/2004, SpO2 97 %. Body mass index is 28.17 kg/m.  General: Cooperative, alert, well developed, in no acute distress. HEENT: Conjunctivae and lids unremarkable. Cardiovascular: Regular rhythm.  Lungs: Normal work of breathing. Neurologic: No focal deficits.   Lab Results  Component Value Date   CREATININE 1.08 (H) 09/13/2021   BUN 18 09/13/2021   NA 137 09/13/2021   K 4.6 09/13/2021   CL 103 09/13/2021   CO2 24 09/13/2021   Lab Results  Component Value Date   ALT 18 09/13/2021   AST 23 09/13/2021   ALKPHOS 68 09/13/2021   BILITOT 0.6 09/13/2021   Lab Results  Component Value Date   HGBA1C 5.9 (H) 09/13/2021   HGBA1C 5.6 05/26/2021   HGBA1C 5.8 (H) 12/23/2020   HGBA1C 5.6 09/17/2020   HGBA1C 5.7 (H) 05/28/2020   Lab Results  Component Value Date   INSULIN 8.9 09/13/2021   INSULIN 8.2 05/26/2021   INSULIN 9.2 12/23/2020   INSULIN 8.4 09/17/2020   INSULIN 8.9 05/28/2020  Lab Results  Component Value Date   TSH 1.040 05/26/2021   Lab Results  Component Value Date   CHOL 176 09/13/2021   HDL 60 09/13/2021   LDLCALC 94 09/13/2021   TRIG 127 09/13/2021   CHOLHDL 2.9 09/13/2021   Lab Results  Component Value Date   VD25OH 58.9 09/13/2021   VD25OH 61.6 05/26/2021   VD25OH 50.3 05/28/2020   Lab Results  Component Value Date   WBC 8.5 10/10/2020   HGB 14.8 10/10/2020   HCT 44.8 10/10/2020   MCV 84.2 10/10/2020   PLT 274 10/10/2020    Obesity Behavioral Intervention:   Approximately 15 minutes were spent on the discussion below.  ASK: We discussed the diagnosis of obesity with Anita Randolph today and Anita Randolph agreed to give [place] permission to discuss obesity behavioral modification therapy today.  ASSESS: Anita Randolph has the diagnosis of obesity and her BMI today is 28.2. Anita Randolph is in the action stage of change.   ADVISE: Anita Randolph was educated on the multiple health risks of obesity as well as the benefit of weight loss to improve her health. She was advised of the need for long term treatment and the importance of lifestyle modifications to improve her current health and to decrease her risk of future health problems.  AGREE: Multiple dietary modification options and treatment options were discussed and Anita Randolph agreed to follow the recommendations documented in the above note.  ARRANGE: Anita Randolph was educated on the importance of frequent visits to treat obesity as outlined per CMS and USPSTF guidelines and agreed to schedule her next follow up appointment today.  Attestation Statements:   Reviewed by clinician on day of visit: allergies, medications, problem list, medical history, surgical history, family history, social history, and previous encounter notes.  I, Water quality scientist, CMA, am acting as Location manager for Mina Marble, NP.  I have reviewed the above documentation for accuracy and completeness, and I agree with the above. -  Anita Randolph d. Anita Tienda, NP-C

## 2021-12-22 LAB — LIPID PANEL
Chol/HDL Ratio: 3.3 ratio (ref 0.0–4.4)
Cholesterol, Total: 181 mg/dL (ref 100–199)
HDL: 55 mg/dL (ref >39)
LDL Chol Calc (NIH): 100 mg/dL — ABNORMAL HIGH (ref 0–99)
Triglycerides: 151 mg/dL — ABNORMAL HIGH (ref 0–149)
VLDL Cholesterol Cal: 26 mg/dL (ref 5–40)

## 2021-12-22 LAB — COMPREHENSIVE METABOLIC PANEL
ALT: 26 IU/L (ref 0–32)
AST: 27 IU/L (ref 0–40)
Albumin/Globulin Ratio: 1.8 (ref 1.2–2.2)
Albumin: 4.4 g/dL (ref 3.8–4.8)
Alkaline Phosphatase: 80 IU/L (ref 44–121)
BUN/Creatinine Ratio: 16 (ref 12–28)
BUN: 16 mg/dL (ref 8–27)
Bilirubin Total: 0.8 mg/dL (ref 0.0–1.2)
CO2: 25 mmol/L (ref 20–29)
Calcium: 9.4 mg/dL (ref 8.7–10.3)
Chloride: 103 mmol/L (ref 96–106)
Creatinine, Ser: 1 mg/dL (ref 0.57–1.00)
Globulin, Total: 2.5 g/dL (ref 1.5–4.5)
Glucose: 95 mg/dL (ref 70–99)
Potassium: 4.8 mmol/L (ref 3.5–5.2)
Sodium: 142 mmol/L (ref 134–144)
Total Protein: 6.9 g/dL (ref 6.0–8.5)
eGFR: 61 mL/min/{1.73_m2} (ref >59)

## 2021-12-22 LAB — HEMOGLOBIN A1C
Est. average glucose Bld gHb Est-mCnc: 117 mg/dL
Hgb A1c MFr Bld: 5.7 % — ABNORMAL HIGH (ref 4.8–5.6)

## 2021-12-22 LAB — VITAMIN D 25 HYDROXY (VIT D DEFICIENCY, FRACTURES): Vit D, 25-Hydroxy: 65.7 ng/mL (ref 30.0–100.0)

## 2022-01-13 ENCOUNTER — Other Ambulatory Visit (INDEPENDENT_AMBULATORY_CARE_PROVIDER_SITE_OTHER): Payer: Self-pay | Admitting: Adult Health

## 2022-01-13 DIAGNOSIS — E782 Mixed hyperlipidemia: Secondary | ICD-10-CM

## 2022-01-13 NOTE — Telephone Encounter (Signed)
Pt last seen by Katy Danford, FNP.  

## 2022-01-13 NOTE — Telephone Encounter (Signed)
LAST APPOINTMENT DATE: 12/21/21 NEXT APPOINTMENT DATE: 01/20/22   [id number] - [place], [place] [address] [place] [address] Phone: [phone] Fax: [phone]  [place] [address] [place] [address] Phone: [phone] Fax: [phone]  Patient is requesting a refill of the following medications: Pending Prescriptions:                       Disp   Refills   atorvastatin (LIPITOR) 40 MG tablet [Pharm*90 tab*0       Sig: TAKE 1 TABLET BY MOUTH EVERY DAY   Date last filled: 09/27/21 Previously prescribed by [facility]  Lab Results      Component                Value               Date                      HGBA1C                   5.7 (H)             12/21/2021                HGBA1C                   5.9 (H)             09/13/2021                HGBA1C                   5.6                 05/26/2021           Lab Results      Component                Value               Date                      LDLCALC                  100 (H)             12/21/2021                CREATININE               1.00                12/21/2021           Lab Results      Component                Value               Date                      VD25OH                   65.7                12/21/2021                VD25OH  58.9                09/13/2021                VD25OH                   61.6                05/26/2021            BP Readings from Last 3 Encounters: 12/21/21 : 126/63 11/15/21 : 130/78 09/13/21 : 139/80

## 2022-01-19 DIAGNOSIS — Z86010 Personal history of colonic polyps: Secondary | ICD-10-CM | POA: Diagnosis not present

## 2022-01-19 DIAGNOSIS — R197 Diarrhea, unspecified: Secondary | ICD-10-CM | POA: Diagnosis not present

## 2022-01-20 ENCOUNTER — Other Ambulatory Visit: Payer: Self-pay

## 2022-01-20 ENCOUNTER — Ambulatory Visit (INDEPENDENT_AMBULATORY_CARE_PROVIDER_SITE_OTHER): Payer: Medicare Other | Admitting: Adult Health

## 2022-01-20 ENCOUNTER — Encounter (INDEPENDENT_AMBULATORY_CARE_PROVIDER_SITE_OTHER): Payer: Self-pay | Admitting: Adult Health

## 2022-01-20 VITALS — BP 103/68 | HR 79 | Temp 98.0°F | Ht 62.0 in | Wt 153.0 lb

## 2022-01-20 DIAGNOSIS — E559 Vitamin D deficiency, unspecified: Secondary | ICD-10-CM

## 2022-01-20 DIAGNOSIS — Z6828 Body mass index (BMI) 28.0-28.9, adult: Secondary | ICD-10-CM | POA: Diagnosis not present

## 2022-01-20 DIAGNOSIS — E782 Mixed hyperlipidemia: Secondary | ICD-10-CM

## 2022-01-20 DIAGNOSIS — E669 Obesity, unspecified: Secondary | ICD-10-CM | POA: Diagnosis not present

## 2022-01-20 DIAGNOSIS — R7303 Prediabetes: Secondary | ICD-10-CM

## 2022-01-20 NOTE — Progress Notes (Signed)
Chief Complaint:   OBESITY Anita Randolph is here to discuss her progress with her obesity treatment plan along with follow-up of her obesity related diagnoses. Anita Randolph is on keeping a food journal and adhering to recommended goals of 09-1199 calories and 85 grams of protein and states she is following her eating plan approximately 50% of the time. Anita Randolph states she is weight training/walking for 60 minutes 5 times per week.  Today's visit was #: 71 Starting weight: 168 lbs Starting date: 02/19/2020 Today's weight: 153 lbs Today's date: 01/20/2022 Total lbs lost to date: 15 lbs Total lbs lost since last in-office visit: 1 lb  Interim History:  Ananya's RMR on 02/19/2020 - 1688. RMR today 1267 - metabolism reduced by 427 calories. She has recently been experiencing GI upset and triggered her to ingest more CHO intake. Her Gastroenterologist recommended that she intake 30g fiber/day. Will undergo C-scope soon.  Subjective:   1. Mixed hyperlipidemia Dicussed labs with pt. She is on atorvastatin 40 mg daily - denies myalgias. She denies acute cardiac sx's. She denies tobacco/vape use. 12/21/21 CMP- LFTs- WNL Lipid Panel- Slight increase in TGs (151) and LDL (100) The 10-year ASCVD risk score (Arnett DK, et al., 2019) is: 5.5%   Values used to calculate the score:     Age: 71 years     Sex: Female     Is Non-Hispanic African American: No     Diabetic: No     Tobacco smoker: No     Systolic Blood Pressure: 025 mmHg     Is BP treated: No     HDL Cholesterol: 55 mg/dL     Total Cholesterol: 181 mg/dL  2. Prediabetes Dicussed labs with pt. She is not on any BG lowering medications. She has been consuming more CHO in the last several weeks due to acute GI upset. 12/21/21 BG 95 A1c 5.7 - improved from 5.9 on 09/13/21   3. Vitamin D deficiency 12/21/21 Vit D Level- 65.7- stable Dicussed labs with pt. She is on calcium carbonate/vitamin D 600/900 plus OTC vitamin D3 2000 IU  daily.  Assessment/Plan:   1. Mixed hyperlipidemia Continue daily statin therapy.  2. Prediabetes Reduce CHO intake.  3. Vitamin D deficiency Continue OTC supplement.  4. Obesity with current BMI 28.0  Myna is currently in the action stage of change. As such, her goal is to continue with weight loss efforts. She has agreed to Category 1/2 with keeping a food journal and adhering to recommended goals of 1100-1200 calories and 85-90 grams of protein.   1) 1-2 cups kale per meal.  2) Add in  Benefiber daily- use per manufacturer's instructions.  3) Increase water.  Exercise goals:  As is.  Behavioral modification strategies: increasing lean protein intake, decreasing simple carbohydrates, meal planning and cooking strategies, keeping healthy foods in the home, and planning for success.  Wafa has agreed to follow-up with our clinic in 2-4 weeks. She was informed of the importance of frequent follow-up visits to maximize her success with intensive lifestyle modifications for her multiple health conditions.   Destini was informed we would discuss her lab results at her next visit unless there is a critical issue that needs to be addressed sooner. Lennyx agreed to keep her next visit at the agreed upon time to discuss these results.  Objective:   Blood pressure 103/68, pulse 79, temperature 98 F (36.7 C), height 5\' 2"  (1.575 m), weight 153 lb (69.4 kg), last menstrual period 11/28/2004, SpO2  97 %. Body mass index is 27.98 kg/m.  General: Cooperative, alert, well developed, in no acute distress. HEENT: Conjunctivae and lids unremarkable. Cardiovascular: Regular rhythm.  Lungs: Normal work of breathing. Neurologic: No focal deficits.   Lab Results  Component Value Date   CREATININE 1.00 12/21/2021   BUN 16 12/21/2021   NA 142 12/21/2021   K 4.8 12/21/2021   CL 103 12/21/2021   CO2 25 12/21/2021   Lab Results  Component Value Date   ALT 26 12/21/2021   AST 27 12/21/2021    ALKPHOS 80 12/21/2021   BILITOT 0.8 12/21/2021   Lab Results  Component Value Date   HGBA1C 5.7 (H) 12/21/2021   HGBA1C 5.9 (H) 09/13/2021   HGBA1C 5.6 05/26/2021   HGBA1C 5.8 (H) 12/23/2020   HGBA1C 5.6 09/17/2020   Lab Results  Component Value Date   INSULIN 8.9 09/13/2021   INSULIN 8.2 05/26/2021   INSULIN 9.2 12/23/2020   INSULIN 8.4 09/17/2020   INSULIN 8.9 05/28/2020   Lab Results  Component Value Date   TSH 1.040 05/26/2021   Lab Results  Component Value Date   CHOL 181 12/21/2021   HDL 55 12/21/2021   LDLCALC 100 (H) 12/21/2021   TRIG 151 (H) 12/21/2021   CHOLHDL 3.3 12/21/2021   Lab Results  Component Value Date   VD25OH 65.7 12/21/2021   VD25OH 58.9 09/13/2021   VD25OH 61.6 05/26/2021   Lab Results  Component Value Date   WBC 8.5 10/10/2020   HGB 14.8 10/10/2020   HCT 44.8 10/10/2020   MCV 84.2 10/10/2020   PLT 274 10/10/2020   Obesity Behavioral Intervention:   Approximately 15 minutes were spent on the discussion below.  ASK: We discussed the diagnosis of obesity with Elysa today and Emalina agreed to give [place] permission to discuss obesity behavioral modification therapy today.  ASSESS: Bronwen has the diagnosis of obesity and her BMI today is 28.0. Hettie is in the action stage of change.   ADVISE: Joyanne was educated on the multiple health risks of obesity as well as the benefit of weight loss to improve her health. She was advised of the need for long term treatment and the importance of lifestyle modifications to improve her current health and to decrease her risk of future health problems.  AGREE: Multiple dietary modification options and treatment options were discussed and Kailey agreed to follow the recommendations documented in the above note.  ARRANGE: Lori-Ann was educated on the importance of frequent visits to treat obesity as outlined per CMS and USPSTF guidelines and agreed to schedule her next follow up appointment  today.  Attestation Statements:   Reviewed by clinician on day of visit: allergies, medications, problem list, medical history, surgical history, family history, social history, and previous encounter notes.  I, Water quality scientist, CMA, am acting as Location manager for Mina Marble, NP.  I have reviewed the above documentation for accuracy and completeness, and I agree with the above. -  Neftali Thurow d. Daisy Mcneel, NP-C

## 2022-01-25 DIAGNOSIS — H10413 Chronic giant papillary conjunctivitis, bilateral: Secondary | ICD-10-CM | POA: Diagnosis not present

## 2022-01-25 DIAGNOSIS — H5213 Myopia, bilateral: Secondary | ICD-10-CM | POA: Diagnosis not present

## 2022-01-25 DIAGNOSIS — Z961 Presence of intraocular lens: Secondary | ICD-10-CM | POA: Diagnosis not present

## 2022-02-14 ENCOUNTER — Ambulatory Visit (INDEPENDENT_AMBULATORY_CARE_PROVIDER_SITE_OTHER): Payer: Medicare Other | Admitting: Adult Health

## 2022-02-25 DIAGNOSIS — Z86010 Personal history of colonic polyps: Secondary | ICD-10-CM | POA: Diagnosis not present

## 2022-02-25 DIAGNOSIS — R197 Diarrhea, unspecified: Secondary | ICD-10-CM | POA: Diagnosis not present

## 2022-02-25 DIAGNOSIS — D122 Benign neoplasm of ascending colon: Secondary | ICD-10-CM | POA: Diagnosis not present

## 2022-02-25 DIAGNOSIS — K635 Polyp of colon: Secondary | ICD-10-CM | POA: Diagnosis not present

## 2022-02-25 DIAGNOSIS — K5730 Diverticulosis of large intestine without perforation or abscess without bleeding: Secondary | ICD-10-CM | POA: Diagnosis not present

## 2022-03-15 ENCOUNTER — Encounter (INDEPENDENT_AMBULATORY_CARE_PROVIDER_SITE_OTHER): Payer: Self-pay | Admitting: Adult Health

## 2022-03-15 ENCOUNTER — Ambulatory Visit (INDEPENDENT_AMBULATORY_CARE_PROVIDER_SITE_OTHER): Payer: Medicare Other | Admitting: Adult Health

## 2022-03-15 VITALS — BP 120/77 | HR 64 | Temp 97.4°F | Ht 62.0 in | Wt 154.0 lb

## 2022-03-15 DIAGNOSIS — R197 Diarrhea, unspecified: Secondary | ICD-10-CM

## 2022-03-15 DIAGNOSIS — E669 Obesity, unspecified: Secondary | ICD-10-CM | POA: Diagnosis not present

## 2022-03-15 DIAGNOSIS — Z6828 Body mass index (BMI) 28.0-28.9, adult: Secondary | ICD-10-CM | POA: Diagnosis not present

## 2022-03-15 DIAGNOSIS — E782 Mixed hyperlipidemia: Secondary | ICD-10-CM

## 2022-03-15 MED ORDER — ATORVASTATIN CALCIUM 40 MG PO TABS: 40.0000 mg | ORAL_TABLET | Freq: Every day | ORAL | 0 refills | Status: DC

## 2022-03-22 ENCOUNTER — Encounter (INDEPENDENT_AMBULATORY_CARE_PROVIDER_SITE_OTHER): Payer: Medicare Other | Admitting: Ophthalmology

## 2022-03-28 NOTE — Progress Notes (Signed)
? ? ? ?Chief Complaint:  ? ?OBESITY ?Kariann is here to discuss her progress with her obesity treatment plan along with follow-up of her obesity related diagnoses. Aline is on the Category 1 Plan, the Category 2 Plan, and keeping a food journal and adhering to recommended goals of 1100-1200 calories and 85-90 grams of protein daily and states she is following her eating plan approximately 50% of the time. Meilah states she is doing weights and walking for 60 minutes 5 times per week. ? ?Today's visit was #: 30 ?Starting weight: 168 lbs ?Starting date: 02/19/2020 ?Today's weight: 154 lbs ?Today's date: 03/15/2022 ?Total lbs lost to date: 47 ?Total lbs lost since last in-office visit: 0 ? ?Interim History:  ?Otilia had a C-scope on 02/25/2022.  ?Since the GI procedure- she has started benefiber and eliminated dairy/diet Coke.  ?She denies current GI upset. ? ?Her daughter is pregnant and is due in September! ? ?Subjective:  ? ?1. Mixed hyperlipidemia ?Caelan is on atorvastatin 40 mg daily, and she denies myalgias.  ?She denies tobacco/vape use. ?She denies acute cardiac sx's. ? ?2. Diarrhea, unspecified type ?01/19/22 GI OV- ?-who returns to the office for evaluation of altered bowel habits. Last visit 11/05/20 was diagnosed with presume viral colitis and prescribed VSL#3 and Levsin.  ?-The first bowel movement of the day typically is a solid formed bowel movement which does not completely evacauate. She then has progressively worsening looseness of the stool until it becomes a loose fluffy consistency. She may go on average 6-8 times during an episode. She has a dull aching LLQ abdominal pain which accompanies the diarrhea, and once the stools resolve then she will have resolution of her pain. ?-Plan: ?-Colonoscopy with random colonic biopsies ?-Fiber 1-2 tbsp QD ?-Continue Levsin SL PRN cramping and diarrhea ?-Continue Imodium up to 4 tablets PRN ?-May use anti-nausea medications PRN ? ?-F/u after colonoscopy ? ?Sommer had a  C-scope on 02/25/2022.  ?Since the GI procedure- she has started benefiber and eliminated dairy/diet Coke.  ?She denies current GI upset. ? ?Assessment/Plan:  ? ?1. Mixed hyperlipidemia ?We will recheck labs at her next office visit. We will refill atorvastatin 40 mg daily for 90 days.  ? ?- atorvastatin (LIPITOR) 40 MG tablet; Take 1 tablet (40 mg total) by mouth daily.  Dispense: 90 tablet; Refill: 0 ? ?2. Diarrhea, unspecified type ?Leigh will continue benefiber and avoid trigger foods.  ? ?3. Obesity with current BMI 28.2 ?Peightyn is currently in the action stage of change. As such, her goal is to continue with weight loss efforts. She has agreed to keeping a food journal and adhering to recommended goals of 1200 calories and 85 grams of protein daily.  ? ?Recheck fasting labs at next office visit. ? ?Exercise goals: As is. ? ?Behavioral modification strategies: increasing lean protein intake, decreasing simple carbohydrates, meal planning and cooking strategies, keeping healthy foods in the home, and planning for success. ? ?Elfrida has agreed to follow-up with our clinic in 4 weeks. She was informed of the importance of frequent follow-up visits to maximize her success with intensive lifestyle modifications for her multiple health conditions.  ? ?Objective:  ? ?Blood pressure 120/77, pulse 64, temperature (!) 97.4 ?F (36.3 ?C), height '5\' 2"'$  (1.575 m), weight 154 lb (69.9 kg), last menstrual period 11/28/2004, SpO2 96 %. ?Body mass index is 28.17 kg/m?. ? ?General: Cooperative, alert, well developed, in no acute distress. ?HEENT: Conjunctivae and lids unremarkable. ?Cardiovascular: Regular rhythm.  ?Lungs: Normal work of  breathing. ?Neurologic: No focal deficits.  ? ?Lab Results  ?Component Value Date  ? CREATININE 1.00 12/21/2021  ? BUN 16 12/21/2021  ? NA 142 12/21/2021  ? K 4.8 12/21/2021  ? CL 103 12/21/2021  ? CO2 25 12/21/2021  ? ?Lab Results  ?Component Value Date  ? ALT 26 12/21/2021  ? AST 27 12/21/2021  ?  ALKPHOS 80 12/21/2021  ? BILITOT 0.8 12/21/2021  ? ?Lab Results  ?Component Value Date  ? HGBA1C 5.7 (H) 12/21/2021  ? HGBA1C 5.9 (H) 09/13/2021  ? HGBA1C 5.6 05/26/2021  ? HGBA1C 5.8 (H) 12/23/2020  ? HGBA1C 5.6 09/17/2020  ? ?Lab Results  ?Component Value Date  ? INSULIN 8.9 09/13/2021  ? INSULIN 8.2 05/26/2021  ? INSULIN 9.2 12/23/2020  ? INSULIN 8.4 09/17/2020  ? INSULIN 8.9 05/28/2020  ? ?Lab Results  ?Component Value Date  ? TSH 1.040 05/26/2021  ? ?Lab Results  ?Component Value Date  ? CHOL 181 12/21/2021  ? HDL 55 12/21/2021  ? LDLCALC 100 (H) 12/21/2021  ? TRIG 151 (H) 12/21/2021  ? CHOLHDL 3.3 12/21/2021  ? ?Lab Results  ?Component Value Date  ? VD25OH 65.7 12/21/2021  ? VD25OH 58.9 09/13/2021  ? VD25OH 61.6 05/26/2021  ? ?Lab Results  ?Component Value Date  ? WBC 8.5 10/10/2020  ? HGB 14.8 10/10/2020  ? HCT 44.8 10/10/2020  ? MCV 84.2 10/10/2020  ? PLT 274 10/10/2020  ? ?No results found for: IRON, TIBC, FERRITIN ? ?Obesity Behavioral Intervention:  ? ?Approximately 15 minutes were spent on the discussion below. ? ?ASK: ?We discussed the diagnosis of obesity with Orla today and Sharronda agreed to give [place] permission to discuss obesity behavioral modification therapy today. ? ?ASSESS: ?Kista has the diagnosis of obesity and her BMI today is 28.2. Shondrika is in the action stage of change.  ? ?ADVISE: ?Gracynn was educated on the multiple health risks of obesity as well as the benefit of weight loss to improve her health. She was advised of the need for long term treatment and the importance of lifestyle modifications to improve her current health and to decrease her risk of future health problems. ? ?AGREE: ?Multiple dietary modification options and treatment options were discussed and Jarrah agreed to follow the recommendations documented in the above note. ? ?ARRANGE: ?Sabria was educated on the importance of frequent visits to treat obesity as outlined per CMS and USPSTF guidelines and agreed to schedule her next  follow up appointment today. ? ?Attestation Statements:  ? ?Reviewed by clinician on day of visit: allergies, medications, problem list, medical history, surgical history, family history, social history, and previous encounter notes. ? ? ?I, Trixie Dredge, am acting as transcriptionist for Mina Marble, NP. ? ?I have reviewed the above documentation for accuracy and completeness, and I agree with the above. -  Anastasya Jewell d. Brynlea Spindler, NP-C ? ?

## 2022-03-29 DIAGNOSIS — R197 Diarrhea, unspecified: Secondary | ICD-10-CM | POA: Insufficient documentation

## 2022-03-30 ENCOUNTER — Encounter (INDEPENDENT_AMBULATORY_CARE_PROVIDER_SITE_OTHER): Payer: Medicare Other | Admitting: Ophthalmology

## 2022-04-13 ENCOUNTER — Ambulatory Visit (INDEPENDENT_AMBULATORY_CARE_PROVIDER_SITE_OTHER): Payer: Medicare Other | Admitting: Adult Health

## 2022-04-13 ENCOUNTER — Encounter (INDEPENDENT_AMBULATORY_CARE_PROVIDER_SITE_OTHER): Payer: Self-pay | Admitting: Adult Health

## 2022-04-13 VITALS — BP 124/83 | HR 79 | Temp 97.0°F | Ht 62.0 in | Wt 154.0 lb

## 2022-04-13 DIAGNOSIS — E538 Deficiency of other specified B group vitamins: Secondary | ICD-10-CM | POA: Diagnosis not present

## 2022-04-13 DIAGNOSIS — Z0000 Encounter for general adult medical examination without abnormal findings: Secondary | ICD-10-CM | POA: Diagnosis not present

## 2022-04-13 DIAGNOSIS — E559 Vitamin D deficiency, unspecified: Secondary | ICD-10-CM | POA: Diagnosis not present

## 2022-04-13 DIAGNOSIS — R7303 Prediabetes: Secondary | ICD-10-CM

## 2022-04-13 DIAGNOSIS — J069 Acute upper respiratory infection, unspecified: Secondary | ICD-10-CM | POA: Diagnosis not present

## 2022-04-13 DIAGNOSIS — Z6828 Body mass index (BMI) 28.0-28.9, adult: Secondary | ICD-10-CM | POA: Diagnosis not present

## 2022-04-13 DIAGNOSIS — N1831 Chronic kidney disease, stage 3a: Secondary | ICD-10-CM

## 2022-04-13 DIAGNOSIS — E669 Obesity, unspecified: Secondary | ICD-10-CM

## 2022-04-13 DIAGNOSIS — E782 Mixed hyperlipidemia: Secondary | ICD-10-CM | POA: Diagnosis not present

## 2022-04-13 DIAGNOSIS — R051 Acute cough: Secondary | ICD-10-CM | POA: Diagnosis not present

## 2022-04-14 ENCOUNTER — Encounter (INDEPENDENT_AMBULATORY_CARE_PROVIDER_SITE_OTHER): Payer: Self-pay

## 2022-04-14 ENCOUNTER — Encounter (INDEPENDENT_AMBULATORY_CARE_PROVIDER_SITE_OTHER): Payer: Medicare Other | Admitting: Ophthalmology

## 2022-04-14 LAB — INSULIN, RANDOM: INSULIN: 13.5 u[IU]/mL (ref 2.6–24.9)

## 2022-04-14 LAB — LIPID PANEL
Chol/HDL Ratio: 3.2 ratio (ref 0.0–4.4)
Cholesterol, Total: 180 mg/dL (ref 100–199)
HDL: 57 mg/dL (ref >39)
LDL Chol Calc (NIH): 99 mg/dL (ref 0–99)
Triglycerides: 138 mg/dL (ref 0–149)
VLDL Cholesterol Cal: 24 mg/dL (ref 5–40)

## 2022-04-14 LAB — COMPREHENSIVE METABOLIC PANEL
ALT: 21 IU/L (ref 0–32)
AST: 23 IU/L (ref 0–40)
Albumin/Globulin Ratio: 1.7 (ref 1.2–2.2)
Albumin: 4.4 g/dL (ref 3.8–4.8)
Alkaline Phosphatase: 76 IU/L (ref 44–121)
BUN/Creatinine Ratio: 19 (ref 12–28)
BUN: 19 mg/dL (ref 8–27)
Bilirubin Total: 0.5 mg/dL (ref 0.0–1.2)
CO2: 24 mmol/L (ref 20–29)
Calcium: 9.5 mg/dL (ref 8.7–10.3)
Chloride: 102 mmol/L (ref 96–106)
Creatinine, Ser: 0.99 mg/dL (ref 0.57–1.00)
Globulin, Total: 2.6 g/dL (ref 1.5–4.5)
Glucose: 100 mg/dL — ABNORMAL HIGH (ref 70–99)
Potassium: 4.5 mmol/L (ref 3.5–5.2)
Sodium: 141 mmol/L (ref 134–144)
Total Protein: 7 g/dL (ref 6.0–8.5)
eGFR: 62 mL/min/{1.73_m2} (ref >59)

## 2022-04-14 LAB — THYROID PANEL WITH TSH
Free Thyroxine Index: 1.8 (ref 1.2–4.9)
T3 Uptake Ratio: 26 % (ref 24–39)
T4, Total: 6.8 ug/dL (ref 4.5–12.0)
TSH: 1.04 u[IU]/mL (ref 0.450–4.500)

## 2022-04-14 LAB — HEMOGLOBIN A1C
Est. average glucose Bld gHb Est-mCnc: 117 mg/dL
Hgb A1c MFr Bld: 5.7 % — ABNORMAL HIGH (ref 4.8–5.6)

## 2022-04-14 LAB — VITAMIN B12: Vitamin B-12: 520 pg/mL (ref 232–1245)

## 2022-04-14 NOTE — Progress Notes (Signed)
Chief Complaint:   OBESITY Aide is here to discuss her progress with her obesity treatment plan along with follow-up of her obesity related diagnoses. Heidi is on keeping a food journal and adhering to recommended goals of 1200 calories and 85 gms protein and states she is following her eating plan approximately 30% of the time. Rolla states she has not exercised due to being sick and traveling.  Today's visit was #: 49 Starting weight: 168 lbs Starting date: 02/19/2020 Today's weight: 154 lbs Today's date: 04/13/22 Total lbs lost to date: 14 lbs Total lbs lost since last in-office visit: 0  Interim History:  Daughter is [redacted] weeks pregnant. She [place] her [place] and [place] frequently over the summer.  Reviewed Bioimpedance with pt: Muscle Mass 93 lbs Adipose Mass 56 lbs MM>AM 93 lbs > 56 Lbs- composition is at goal.   Subjective:   1. Stage 3a chronic kidney disease ([place]) Not on antihypertensive therapy currently.  2. Prediabetes A1c has been hovering between 5.6-6.1. She is not on any antidiabetic medications.  3. Mixed hyperlipidemia She is on atorvastatin 40 mg-tolerating well.  4. Vitamin D deficiency On calcium carbonate/vitamin D supplementation.  5. B12 nutritional deficiency Not on B12 supplementation.  6. Healthcare maintenance She denies excessive fatigue.  Assessment/Plan:   1. Stage 3a chronic kidney disease ([facility]) Avoid NSAIDs. Check labs. - Comprehensive metabolic panel  2. Prediabetes Check labs. - Hemoglobin A1c - Insulin, random  3. Mixed hyperlipidemia Check labs - Lipid panel  4. Vitamin D deficiency Check labs.  5. B12 nutritional deficiency Check labs. - Vitamin B12  6. Healthcare maintenance Check thyroid panel. - Thyroid Panel With TSH  7. Obesity with current BMI 28.3 Liah is currently in the action stage of change. As such, her goal is to continue with weight loss efforts. She has agreed to  keeping a food journal and adhering to recommended goals of 1200 calories and 85 gms protein daily.  Exercise goals: As is.  Behavioral modification strategies: increasing lean protein intake, decreasing simple carbohydrates, meal planning and cooking strategies, keeping healthy foods in the home, and planning for success.  Claudette has agreed to follow-up with our clinic in 6 weeks. She was informed of the importance of frequent follow-up visits to maximize her success with intensive lifestyle modifications for her multiple health conditions.   Ryelle was informed we would discuss her lab results at her next visit unless there is a critical issue that needs to be addressed sooner. Jorgina agreed to keep her next visit at the agreed upon time to discuss these results.  Objective:   Blood pressure 124/83, pulse 79, temperature (!) 97 F (36.1 C), height '5\' 2"'$  (1.575 m), weight 154 lb (69.9 kg), last menstrual period 11/28/2004, SpO2 97 %. Body mass index is 28.17 kg/m.  General: Cooperative, alert, well developed, in no acute distress. HEENT: Conjunctivae and lids unremarkable. Cardiovascular: Regular rhythm.  Lungs: Normal work of breathing. Neurologic: No focal deficits.   Lab Results  Component Value Date   CREATININE 0.99 04/13/2022   BUN 19 04/13/2022   NA 141 04/13/2022   K 4.5 04/13/2022   CL 102 04/13/2022   CO2 24 04/13/2022   Lab Results  Component Value Date   ALT 21 04/13/2022   AST 23 04/13/2022   ALKPHOS 76 04/13/2022   BILITOT 0.5 04/13/2022   Lab Results  Component Value Date   HGBA1C 5.7 (H) 04/13/2022   HGBA1C 5.7 (  H) 12/21/2021   HGBA1C 5.9 (H) 09/13/2021   HGBA1C 5.6 05/26/2021   HGBA1C 5.8 (H) 12/23/2020   Lab Results  Component Value Date   INSULIN 13.5 04/13/2022   INSULIN 8.9 09/13/2021   INSULIN 8.2 05/26/2021   INSULIN 9.2 12/23/2020   INSULIN 8.4 09/17/2020   Lab Results  Component Value Date   TSH 1.040 04/13/2022   Lab Results   Component Value Date   CHOL 180 04/13/2022   HDL 57 04/13/2022   LDLCALC 99 04/13/2022   TRIG 138 04/13/2022   CHOLHDL 3.2 04/13/2022   Lab Results  Component Value Date   VD25OH 65.7 12/21/2021   VD25OH 58.9 09/13/2021   VD25OH 61.6 05/26/2021   Lab Results  Component Value Date   WBC 8.5 10/10/2020   HGB 14.8 10/10/2020   HCT 44.8 10/10/2020   MCV 84.2 10/10/2020   PLT 274 10/10/2020   No results found for: IRON, TIBC, FERRITIN  Obesity Behavioral Intervention:   Approximately 15 minutes were spent on the discussion below.  ASK: We discussed the diagnosis of obesity with Jazyah today and Alynna agreed to give [place] permission to discuss obesity behavioral modification therapy today.  ASSESS: Nigel has the diagnosis of obesity and her BMI today is 28.3. Rynlee is in the action stage of change.   ADVISE: Ariyan was educated on the multiple health risks of obesity as well as the benefit of weight loss to improve her health. She was advised of the need for long term treatment and the importance of lifestyle modifications to improve her current health and to decrease her risk of future health problems.  AGREE: Multiple dietary modification options and treatment options were discussed and Pamella agreed to follow the recommendations documented in the above note.  ARRANGE: Ayahna was educated on the importance of frequent visits to treat obesity as outlined per CMS and USPSTF guidelines and agreed to schedule her next follow up appointment today.  Attestation Statements:   Reviewed by clinician on day of visit: allergies, medications, problem list, medical history, surgical history, family history, social history, and previous encounter notes.  I, Georgianne Fick, FNP, am acting as Location manager for Mina Marble, NP.  I have reviewed the above documentation for accuracy and completeness, and I agree with the above. -  Leasa Kincannon d. Anton Cheramie, NP-C

## 2022-04-18 ENCOUNTER — Encounter (INDEPENDENT_AMBULATORY_CARE_PROVIDER_SITE_OTHER): Payer: Self-pay

## 2022-04-18 ENCOUNTER — Encounter (INDEPENDENT_AMBULATORY_CARE_PROVIDER_SITE_OTHER): Payer: Medicare Other | Admitting: Ophthalmology

## 2022-04-27 DIAGNOSIS — K580 Irritable bowel syndrome with diarrhea: Secondary | ICD-10-CM | POA: Diagnosis not present

## 2022-04-27 DIAGNOSIS — Z86010 Personal history of colonic polyps: Secondary | ICD-10-CM | POA: Diagnosis not present

## 2022-04-27 DIAGNOSIS — K5730 Diverticulosis of large intestine without perforation or abscess without bleeding: Secondary | ICD-10-CM | POA: Diagnosis not present

## 2022-05-03 DIAGNOSIS — I700 Atherosclerosis of aorta: Secondary | ICD-10-CM | POA: Diagnosis not present

## 2022-05-03 DIAGNOSIS — Z0000 Encounter for general adult medical examination without abnormal findings: Secondary | ICD-10-CM | POA: Diagnosis not present

## 2022-05-03 DIAGNOSIS — Z853 Personal history of malignant neoplasm of breast: Secondary | ICD-10-CM | POA: Diagnosis not present

## 2022-05-26 ENCOUNTER — Ambulatory Visit (INDEPENDENT_AMBULATORY_CARE_PROVIDER_SITE_OTHER): Payer: Medicare Other | Admitting: Adult Health

## 2022-06-08 ENCOUNTER — Encounter (INDEPENDENT_AMBULATORY_CARE_PROVIDER_SITE_OTHER): Payer: Medicare Other | Admitting: Ophthalmology

## 2022-06-08 DIAGNOSIS — H338 Other retinal detachments: Secondary | ICD-10-CM | POA: Diagnosis not present

## 2022-06-08 DIAGNOSIS — H43812 Vitreous degeneration, left eye: Secondary | ICD-10-CM

## 2022-06-08 DIAGNOSIS — D3131 Benign neoplasm of right choroid: Secondary | ICD-10-CM | POA: Diagnosis not present

## 2022-06-10 ENCOUNTER — Other Ambulatory Visit: Payer: Self-pay | Admitting: Obstetrics and Gynecology

## 2022-06-10 DIAGNOSIS — Z1231 Encounter for screening mammogram for malignant neoplasm of breast: Secondary | ICD-10-CM

## 2022-06-13 DIAGNOSIS — D485 Neoplasm of uncertain behavior of skin: Secondary | ICD-10-CM | POA: Diagnosis not present

## 2022-06-13 DIAGNOSIS — L820 Inflamed seborrheic keratosis: Secondary | ICD-10-CM | POA: Diagnosis not present

## 2022-06-13 DIAGNOSIS — L821 Other seborrheic keratosis: Secondary | ICD-10-CM | POA: Diagnosis not present

## 2022-06-13 DIAGNOSIS — D2262 Melanocytic nevi of left upper limb, including shoulder: Secondary | ICD-10-CM | POA: Diagnosis not present

## 2022-06-13 DIAGNOSIS — D2272 Melanocytic nevi of left lower limb, including hip: Secondary | ICD-10-CM | POA: Diagnosis not present

## 2022-06-13 DIAGNOSIS — D225 Melanocytic nevi of trunk: Secondary | ICD-10-CM | POA: Diagnosis not present

## 2022-06-20 ENCOUNTER — Ambulatory Visit (INDEPENDENT_AMBULATORY_CARE_PROVIDER_SITE_OTHER): Payer: Medicare Other | Admitting: Adult Health

## 2022-06-20 ENCOUNTER — Encounter (INDEPENDENT_AMBULATORY_CARE_PROVIDER_SITE_OTHER): Payer: Self-pay | Admitting: Adult Health

## 2022-06-20 VITALS — BP 114/78 | HR 87 | Temp 97.8°F | Ht 62.0 in | Wt 159.0 lb

## 2022-06-20 DIAGNOSIS — E669 Obesity, unspecified: Secondary | ICD-10-CM | POA: Diagnosis not present

## 2022-06-20 DIAGNOSIS — Z6829 Body mass index (BMI) 29.0-29.9, adult: Secondary | ICD-10-CM | POA: Diagnosis not present

## 2022-06-20 DIAGNOSIS — E782 Mixed hyperlipidemia: Secondary | ICD-10-CM

## 2022-06-20 MED ORDER — ATORVASTATIN CALCIUM 40 MG PO TABS: 40.0000 mg | ORAL_TABLET | Freq: Every day | ORAL | 0 refills | Status: DC

## 2022-06-22 NOTE — Progress Notes (Unsigned)
Chief Complaint:   OBESITY Anita Randolph is here to discuss her progress with her obesity treatment plan along with follow-up of her obesity related diagnoses. Anita Randolph is on {MWMwtlossportion/plan2:23431} and states she is following her eating plan approximately ***% of the time. Anita Randolph states she is *** *** minutes *** times per week.  Today's visit was #: *** Starting weight: *** Starting date: *** Today's weight: *** Today's date: 06/20/2022 Total lbs lost to date: *** Total lbs lost since last in-office visit: ***  Interim History: ***  Subjective:   1. Mixed hyperlipidemia ***   Assessment/Plan:   1. Mixed hyperlipidemia *** - atorvastatin (LIPITOR) 40 MG tablet; Take 1 tablet (40 mg total) by mouth daily.  Dispense: 90 tablet; Refill: 0  2. Obesity with current BMI 29.1 Anita Randolph {CHL AMB IS/IS NOT:[id number]} currently in the action stage of change. As such, her goal is to {MWMwtloss#1:210800005}. She has agreed to {MWMwtlossportion/plan2:23431}.   Exercise goals: {MWM EXERCISE RECS:23473}  Behavioral modification strategies: {MWMwtlossdietstrategies3:23432}.  Anita Randolph has agreed to follow-up with our clinic in {NUMBER 1-10:22536} weeks. She was informed of the importance of frequent follow-up visits to maximize her success with intensive lifestyle modifications for her multiple health conditions.   Objective:   Blood pressure 114/78, pulse 87, temperature 97.8 F (36.6 C), height '5\' 2"'$  (1.575 m), weight 159 lb (72.1 kg), last menstrual period 11/28/2004, SpO2 99 %. Body mass index is 29.08 kg/m.  General: Cooperative, alert, well developed, in no acute distress. HEENT: Conjunctivae and lids unremarkable. Cardiovascular: Regular rhythm.  Lungs: Normal work of breathing. Neurologic: No focal deficits.   Lab Results  Component Value Date   CREATININE 0.99 04/13/2022   BUN 19 04/13/2022   NA 141 04/13/2022   K 4.5 04/13/2022   CL 102 04/13/2022   CO2 24 04/13/2022    Lab Results  Component Value Date   ALT 21 04/13/2022   AST 23 04/13/2022   ALKPHOS 76 04/13/2022   BILITOT 0.5 04/13/2022   Lab Results  Component Value Date   HGBA1C 5.7 (H) 04/13/2022   HGBA1C 5.7 (H) 12/21/2021   HGBA1C 5.9 (H) 09/13/2021   HGBA1C 5.6 05/26/2021   HGBA1C 5.8 (H) 12/23/2020   Lab Results  Component Value Date   INSULIN 13.5 04/13/2022   INSULIN 8.9 09/13/2021   INSULIN 8.2 05/26/2021   INSULIN 9.2 12/23/2020   INSULIN 8.4 09/17/2020   Lab Results  Component Value Date   TSH 1.040 04/13/2022   Lab Results  Component Value Date   CHOL 180 04/13/2022   HDL 57 04/13/2022   LDLCALC 99 04/13/2022   TRIG 138 04/13/2022   CHOLHDL 3.2 04/13/2022   Lab Results  Component Value Date   VD25OH 65.7 12/21/2021   VD25OH 58.9 09/13/2021   VD25OH 61.6 05/26/2021   Lab Results  Component Value Date   WBC 8.5 10/10/2020   HGB 14.8 10/10/2020   HCT 44.8 10/10/2020   MCV 84.2 10/10/2020   PLT 274 10/10/2020   No results found for: "IRON", "TIBC", "FERRITIN"  Obesity Behavioral Intervention:   Approximately 15 minutes were spent on the discussion below.  ASK: We discussed the diagnosis of obesity with Anita Randolph today and Anita Randolph agreed to give [place] permission to discuss obesity behavioral modification therapy today.  ASSESS: Anita Randolph has the diagnosis of obesity and her BMI today is 29.1. Anita Randolph is in the action stage of change.   ADVISE: Anita Randolph was educated on the multiple health risks of obesity as well  as the benefit of weight loss to improve her health. She was advised of the need for long term treatment and the importance of lifestyle modifications to improve her current health and to decrease her risk of future health problems.  AGREE: Multiple dietary modification options and treatment options were discussed and Anita Randolph agreed to follow the recommendations documented in the above note.  ARRANGE: Anita Randolph was educated on the importance of frequent visits to  treat obesity as outlined per CMS and USPSTF guidelines and agreed to schedule her next follow up appointment today.  Attestation Statements:   Reviewed by clinician on day of visit: allergies, medications, problem list, medical history, surgical history, family history, social history, and previous encounter notes.  Wilhemena Durie, am acting as transcriptionist for Mina Marble, NP.  I have reviewed the above documentation for accuracy and completeness, and I agree with the above. -  ***

## 2022-06-27 DIAGNOSIS — R399 Unspecified symptoms and signs involving the genitourinary system: Secondary | ICD-10-CM | POA: Diagnosis not present

## 2022-07-06 ENCOUNTER — Encounter (INDEPENDENT_AMBULATORY_CARE_PROVIDER_SITE_OTHER): Payer: Self-pay

## 2022-08-08 ENCOUNTER — Ambulatory Visit (INDEPENDENT_AMBULATORY_CARE_PROVIDER_SITE_OTHER): Payer: Medicare Other | Admitting: Adult Health

## 2022-08-08 DIAGNOSIS — S80212A Abrasion, left knee, initial encounter: Secondary | ICD-10-CM | POA: Diagnosis not present

## 2022-08-08 DIAGNOSIS — W19XXXA Unspecified fall, initial encounter: Secondary | ICD-10-CM | POA: Diagnosis not present

## 2022-08-09 ENCOUNTER — Ambulatory Visit
Admission: RE | Admit: 2022-08-09 | Discharge: 2022-08-09 | Disposition: A | Discharge status: Home / Self Care | Payer: Medicare Other | Source: Ambulatory Visit | Attending: Obstetrics and Gynecology | Admitting: Obstetrics and Gynecology

## 2022-08-09 DIAGNOSIS — Z1231 Encounter for screening mammogram for malignant neoplasm of breast: Secondary | ICD-10-CM

## 2022-08-11 DIAGNOSIS — Z23 Encounter for immunization: Secondary | ICD-10-CM | POA: Diagnosis not present

## 2022-08-17 DIAGNOSIS — S81002S Unspecified open wound, left knee, sequela: Secondary | ICD-10-CM | POA: Diagnosis not present

## 2022-08-19 ENCOUNTER — Encounter (INDEPENDENT_AMBULATORY_CARE_PROVIDER_SITE_OTHER): Payer: Self-pay | Admitting: Adult Health

## 2022-08-21 DIAGNOSIS — Z23 Encounter for immunization: Secondary | ICD-10-CM | POA: Diagnosis not present

## 2022-08-24 ENCOUNTER — Ambulatory Visit (INDEPENDENT_AMBULATORY_CARE_PROVIDER_SITE_OTHER): Payer: Medicare Other | Admitting: Adult Health

## 2022-09-08 ENCOUNTER — Other Ambulatory Visit (INDEPENDENT_AMBULATORY_CARE_PROVIDER_SITE_OTHER): Payer: Self-pay | Admitting: Adult Health

## 2022-09-08 DIAGNOSIS — Z124 Encounter for screening for malignant neoplasm of cervix: Secondary | ICD-10-CM | POA: Diagnosis not present

## 2022-09-08 DIAGNOSIS — E782 Mixed hyperlipidemia: Secondary | ICD-10-CM

## 2022-09-08 DIAGNOSIS — Z6829 Body mass index (BMI) 29.0-29.9, adult: Secondary | ICD-10-CM | POA: Diagnosis not present

## 2022-09-08 DIAGNOSIS — Z1151 Encounter for screening for human papillomavirus (HPV): Secondary | ICD-10-CM | POA: Diagnosis not present

## 2022-09-09 ENCOUNTER — Other Ambulatory Visit: Payer: Self-pay | Admitting: Obstetrics and Gynecology

## 2022-09-09 DIAGNOSIS — H903 Sensorineural hearing loss, bilateral: Secondary | ICD-10-CM | POA: Diagnosis not present

## 2022-09-09 DIAGNOSIS — Z129 Encounter for screening for malignant neoplasm, site unspecified: Secondary | ICD-10-CM

## 2022-09-21 ENCOUNTER — Encounter (INDEPENDENT_AMBULATORY_CARE_PROVIDER_SITE_OTHER): Payer: Self-pay | Admitting: Adult Health

## 2022-09-21 ENCOUNTER — Ambulatory Visit (INDEPENDENT_AMBULATORY_CARE_PROVIDER_SITE_OTHER): Payer: Medicare Other | Admitting: Adult Health

## 2022-09-21 VITALS — BP 117/77 | HR 77 | Temp 97.7°F | Ht 62.0 in | Wt 155.0 lb

## 2022-09-21 DIAGNOSIS — N1831 Chronic kidney disease, stage 3a: Secondary | ICD-10-CM

## 2022-09-21 DIAGNOSIS — R7303 Prediabetes: Secondary | ICD-10-CM | POA: Diagnosis not present

## 2022-09-21 DIAGNOSIS — E782 Mixed hyperlipidemia: Secondary | ICD-10-CM

## 2022-09-21 DIAGNOSIS — E669 Obesity, unspecified: Secondary | ICD-10-CM

## 2022-09-21 DIAGNOSIS — Z6828 Body mass index (BMI) 28.0-28.9, adult: Secondary | ICD-10-CM

## 2022-09-21 DIAGNOSIS — E559 Vitamin D deficiency, unspecified: Secondary | ICD-10-CM

## 2022-09-21 MED ORDER — ATORVASTATIN CALCIUM 40 MG PO TABS: 40.0000 mg | ORAL_TABLET | Freq: Every day | ORAL | 0 refills | Status: DC

## 2022-09-22 ENCOUNTER — Encounter (INDEPENDENT_AMBULATORY_CARE_PROVIDER_SITE_OTHER): Payer: Self-pay | Admitting: Adult Health

## 2022-09-22 LAB — COMPREHENSIVE METABOLIC PANEL
ALT: 20 IU/L (ref 0–32)
AST: 30 IU/L (ref 0–40)
Albumin/Globulin Ratio: 1.7 (ref 1.2–2.2)
Albumin: 4.7 g/dL (ref 3.9–4.9)
Alkaline Phosphatase: 72 IU/L (ref 44–121)
BUN/Creatinine Ratio: 15 (ref 12–28)
BUN: 17 mg/dL (ref 8–27)
Bilirubin Total: 0.7 mg/dL (ref 0.0–1.2)
CO2: 24 mmol/L (ref 20–29)
Calcium: 10.1 mg/dL (ref 8.7–10.3)
Chloride: 102 mmol/L (ref 96–106)
Creatinine, Ser: 1.15 mg/dL — ABNORMAL HIGH (ref 0.57–1.00)
Globulin, Total: 2.7 g/dL (ref 1.5–4.5)
Glucose: 93 mg/dL (ref 70–99)
Potassium: 4.5 mmol/L (ref 3.5–5.2)
Sodium: 141 mmol/L (ref 134–144)
Total Protein: 7.4 g/dL (ref 6.0–8.5)
eGFR: 51 mL/min/{1.73_m2} — ABNORMAL LOW (ref >59)

## 2022-09-22 LAB — LIPID PANEL
Chol/HDL Ratio: 3.2 ratio (ref 0.0–4.4)
Cholesterol, Total: 187 mg/dL (ref 100–199)
HDL: 59 mg/dL (ref >39)
LDL Chol Calc (NIH): 109 mg/dL — ABNORMAL HIGH (ref 0–99)
Triglycerides: 105 mg/dL (ref 0–149)
VLDL Cholesterol Cal: 19 mg/dL (ref 5–40)

## 2022-09-22 LAB — HEMOGLOBIN A1C
Est. average glucose Bld gHb Est-mCnc: 120 mg/dL
Hgb A1c MFr Bld: 5.8 % — ABNORMAL HIGH (ref 4.8–5.6)

## 2022-09-22 LAB — INSULIN, RANDOM: INSULIN: 11 u[IU]/mL (ref 2.6–24.9)

## 2022-09-22 LAB — VITAMIN D 25 HYDROXY (VIT D DEFICIENCY, FRACTURES): Vit D, 25-Hydroxy: 64.6 ng/mL (ref 30.0–100.0)

## 2022-09-24 NOTE — Progress Notes (Unsigned)
Chief Complaint:   OBESITY Anita Randolph is here to discuss her progress with her obesity treatment plan along with follow-up of her obesity related diagnoses. [name] is on keeping a food journal and adhering to recommended goals of 1200 calories and 85 protein and states she is following her eating plan approximately 50% of the time. [name] states she is 60+ minutes 2-3 times per week.  Today's visit was #: 55 Starting weight: 168 lbs Starting date: 02/19/2020 Today's weight: 155 lbs Today's date: 09/21/2022 Total lbs lost to date: 13 lbs Total lbs lost since last in-office visit: 4 lbs  Interim History: Her daughter delivered her healthy girl 07/22/2022.  Tdap free up to date, got it in [place], she was unable to exercise for 4 weeks.  She recently resumed walking and gym exercises.   Subjective:   1. Mixed hyperlipidemia Daily Lipitor 40 mg, denies myalgias.   2. Vitamin D deficiency She is on OTC Vitamin D-3, 1,000 IU daily.   3. Stage 3a chronic kidney disease ([place]) She is not on ACE or ARB medications.   4. Prediabetes She is not on any blood glucose lowering medications.   Assessment/Plan:   1. Mixed hyperlipidemia Refill - atorvastatin (LIPITOR) 40 MG tablet; Take 1 tablet (40 mg total) by mouth daily.  Dispense: 90 tablet; Refill: 0  Check labs today.   - Lipid panel  2. Vitamin D deficiency Check labs today.   - VITAMIN D 25 Hydroxy (Vit-D Deficiency, Fractures)  3. Stage 3a chronic kidney disease (Anita Randolph) Check labs today and continue to avoid Nephrotoxic medications.   - Comprehensive metabolic panel  4. Prediabetes Check labs today.   - Hemoglobin A1c - Insulin, random  5. Obesity with current BMI 28.4 Check IC at next office visit, pt is aware to arrive 30 minutes prior to appointment.   [name] is currently in the action stage of change. As such, her goal is to continue with weight loss efforts. She has agreed to keeping a food journal and adhering to  recommended goals of 1200 calories and 85 protein daily.    Exercise goals:  As is.   Behavioral modification strategies: increasing lean protein intake, decreasing simple carbohydrates, meal planning and cooking strategies, keeping healthy foods in the home, and planning for success.  [name] has agreed to follow-up with our clinic in 8 weeks. She was informed of the importance of frequent follow-up visits to maximize her success with intensive lifestyle modifications for her multiple health conditions.   [name] was informed we would discuss her lab results at her next visit unless there is a critical issue that needs to be addressed sooner. [name] agreed to keep her next visit at the agreed upon time to discuss these results.  Objective:   Blood pressure 117/77, pulse 77, temperature 97.7 F (36.5 C), height '5\' 2"'$  (1.575 m), weight 155 lb (70.3 kg), last menstrual period 11/28/2004, SpO2 98 %. Body mass index is 28.35 kg/m.  General: Cooperative, alert, well developed, in no acute distress. HEENT: Conjunctivae and lids unremarkable. Cardiovascular: Regular rhythm.  Lungs: Normal work of breathing. Neurologic: No focal deficits.   Lab Results  Component Value Date   CREATININE 1.15 (H) 09/21/2022   BUN 17 09/21/2022   NA 141 09/21/2022   K 4.5 09/21/2022   CL 102 09/21/2022   CO2 24 09/21/2022   Lab Results  Component Value Date   ALT 20 09/21/2022   AST 30 09/21/2022   ALKPHOS 72 09/21/2022  BILITOT 0.7 09/21/2022   Lab Results  Component Value Date   HGBA1C 5.8 (H) 09/21/2022   HGBA1C 5.7 (H) 04/13/2022   HGBA1C 5.7 (H) 12/21/2021   HGBA1C 5.9 (H) 09/13/2021   HGBA1C 5.6 05/26/2021   Lab Results  Component Value Date   INSULIN 11.0 09/21/2022   INSULIN 13.5 04/13/2022   INSULIN 8.9 09/13/2021   INSULIN 8.2 05/26/2021   INSULIN 9.2 12/23/2020   Lab Results  Component Value Date   TSH 1.040 04/13/2022   Lab Results  Component Value Date   CHOL 187 09/21/2022    HDL 59 09/21/2022   LDLCALC 109 (H) 09/21/2022   TRIG 105 09/21/2022   CHOLHDL 3.2 09/21/2022   Lab Results  Component Value Date   VD25OH 64.6 09/21/2022   VD25OH 65.7 12/21/2021   VD25OH 58.9 09/13/2021   Lab Results  Component Value Date   WBC 8.5 10/10/2020   HGB 14.8 10/10/2020   HCT 44.8 10/10/2020   MCV 84.2 10/10/2020   PLT 274 10/10/2020   No results found for: "IRON", "TIBC", "FERRITIN"  Attestation Statements:   Reviewed by clinician on day of visit: allergies, medications, problem list, medical history, surgical history, family history, social history, and previous encounter notes.  I, Davy Pique, RMA, am acting as Location manager for Mina Marble, NP.  I have reviewed the above documentation for accuracy and completeness, and I agree with the above. -  ***

## 2022-09-27 ENCOUNTER — Encounter (INDEPENDENT_AMBULATORY_CARE_PROVIDER_SITE_OTHER): Payer: Self-pay | Admitting: Adult Health

## 2022-10-05 DIAGNOSIS — R399 Unspecified symptoms and signs involving the genitourinary system: Secondary | ICD-10-CM | POA: Diagnosis not present

## 2022-10-05 DIAGNOSIS — J0100 Acute maxillary sinusitis, unspecified: Secondary | ICD-10-CM | POA: Diagnosis not present

## 2022-12-06 ENCOUNTER — Ambulatory Visit (INDEPENDENT_AMBULATORY_CARE_PROVIDER_SITE_OTHER): Payer: Medicare Other | Admitting: Adult Health

## 2022-12-06 ENCOUNTER — Encounter (INDEPENDENT_AMBULATORY_CARE_PROVIDER_SITE_OTHER): Payer: Self-pay | Admitting: Adult Health

## 2022-12-06 VITALS — BP 134/88 | HR 75 | Temp 98.0°F | Ht 62.0 in | Wt 156.0 lb

## 2022-12-06 DIAGNOSIS — E559 Vitamin D deficiency, unspecified: Secondary | ICD-10-CM

## 2022-12-06 DIAGNOSIS — R7303 Prediabetes: Secondary | ICD-10-CM | POA: Diagnosis not present

## 2022-12-06 DIAGNOSIS — E669 Obesity, unspecified: Secondary | ICD-10-CM | POA: Diagnosis not present

## 2022-12-06 DIAGNOSIS — N1831 Chronic kidney disease, stage 3a: Secondary | ICD-10-CM

## 2022-12-06 DIAGNOSIS — Z6828 Body mass index (BMI) 28.0-28.9, adult: Secondary | ICD-10-CM

## 2022-12-06 DIAGNOSIS — E782 Mixed hyperlipidemia: Secondary | ICD-10-CM | POA: Diagnosis not present

## 2022-12-06 DIAGNOSIS — R0602 Shortness of breath: Secondary | ICD-10-CM

## 2022-12-12 NOTE — Progress Notes (Signed)
Chief Complaint:   OBESITY Anita Randolph is here to discuss her progress with her obesity treatment plan along with follow-up of her obesity related diagnoses. Anita Randolph is on keeping a food journal and adhering to recommended goals of 1200 calories and 85 protein and states she is following her eating plan approximately 25% of the time. Anita Randolph states she is walking 60 minutes 5 times per week.  Today's visit was #: 59 Starting weight: 168 LBS Starting date: 02/19/2020 Today's weight: 156 lbs Today's date: 12/06/2022 Total lbs lost to date: 12 lbs Total lbs lost since last in-office visit: +1 lb  Interim History:  02/19/20 RMR 1688 01/20/22 RMR 1267 12/06/22   RMR 1253  Metabolism essentially unchanged in the last 11 months.  When she was in [place] she averaged 15-16K steps per day with a high of 23K/day.  Subjective:   1. SOB (shortness of breath) She endorses dyspnea with extreme exertion, denies CP. 02/19/20 RMR 1688 01/20/22 RMR 1267 12/06/22   RMR 1253  Metabolism essentially unchanged in the last 11 months.  2. Mixed hyperlipidemia Lipid Panel     Component Value Date/Time   CHOL 187 09/21/2022 0937   TRIG 105 09/21/2022 0937   HDL 59 09/21/2022 0937   CHOLHDL 3.2 09/21/2022 0937   LDLCALC 109 (H) 09/21/2022 0937   LABVLDL 19 09/21/2022 0937    Daily atorvastatin 40 mg, no myalgias.    3. Vitamin D deficiency OTC vitamin D3 1000 IU daily.  4. Stage 3a chronic kidney disease ([place]) Patient is not taking any OTC NSAIDs.  5. Prediabetes Lab Results  Component Value Date   HGBA1C 5.8 (H) 09/21/2022   HGBA1C 5.7 (H) 04/13/2022   HGBA1C 5.7 (H) 12/21/2021     Assessment/Plan:   1. SOB (shortness of breath) Check IC, increase protein intake.   2. Mixed hyperlipidemia Check labs at next  visit.   3. Vitamin D deficiency Check labs at next visit.  4. Stage 3a chronic kidney disease ([place]) Check labs at next visit continue to avoid nephrotoxic substances.  5.  Prediabetes Check labs at next office visit.  6. Obesity, current BMI 28.6 Anita Randolph is currently in the action stage of change. As such, her goal is to continue with weight loss efforts. She has agreed to keeping a food journal and adhering to recommended goals of 1100 calories and 100 protein daily.   Slightly decrease calories and increase protein per day.  Exercise goals:  As is.  Behavioral modification strategies: increasing lean protein intake, decreasing simple carbohydrates, meal planning and cooking strategies, keeping healthy foods in the home, planning for success, and keeping a strict food journal.  Anita Randolph has agreed to follow-up with our clinic in 4 weeks. She was informed of the importance of frequent follow-up visits to maximize her success with intensive lifestyle modifications for her multiple health conditions.   Objective:   Blood pressure 134/88, pulse 75, temperature 98 F (36.7 C), height '5\' 2"'$  (1.575 m), weight 156 lb (70.8 kg), last menstrual period 11/28/2004, SpO2 98 %. Body mass index is 28.53 kg/m.  General: Cooperative, alert, well developed, in no acute distress. HEENT: Conjunctivae and lids unremarkable. Cardiovascular: Regular rhythm.  Lungs: Normal work of breathing. Neurologic: No focal deficits.   Lab Results  Component Value Date   CREATININE 1.15 (H) 09/21/2022   BUN 17 09/21/2022   NA 141 09/21/2022   K 4.5 09/21/2022   CL 102 09/21/2022   CO2 24 09/21/2022  Lab Results  Component Value Date   ALT 20 09/21/2022   AST 30 09/21/2022   ALKPHOS 72 09/21/2022   BILITOT 0.7 09/21/2022   Lab Results  Component Value Date   HGBA1C 5.8 (H) 09/21/2022   HGBA1C 5.7 (H) 04/13/2022   HGBA1C 5.7 (H) 12/21/2021   HGBA1C 5.9 (H) 09/13/2021   HGBA1C 5.6 05/26/2021   Lab Results  Component Value Date   INSULIN 11.0 09/21/2022   INSULIN 13.5 04/13/2022   INSULIN 8.9 09/13/2021   INSULIN 8.2 05/26/2021   INSULIN 9.2 12/23/2020   Lab Results   Component Value Date   TSH 1.040 04/13/2022   Lab Results  Component Value Date   CHOL 187 09/21/2022   HDL 59 09/21/2022   LDLCALC 109 (H) 09/21/2022   TRIG 105 09/21/2022   CHOLHDL 3.2 09/21/2022   Lab Results  Component Value Date   VD25OH 64.6 09/21/2022   VD25OH 65.7 12/21/2021   VD25OH 58.9 09/13/2021   Lab Results  Component Value Date   WBC 8.5 10/10/2020   HGB 14.8 10/10/2020   HCT 44.8 10/10/2020   MCV 84.2 10/10/2020   PLT 274 10/10/2020   No results found for: "IRON", "TIBC", "FERRITIN"  Attestation Statements:   Reviewed by clinician on day of visit: allergies, medications, problem list, medical history, surgical history, family history, social history, and previous encounter notes.  I, Davy Pique, RMA, am acting as Location manager for Mina Marble, NP.  I have reviewed the above documentation for accuracy and completeness, and I agree with the above. -  Paidyn Mcferran d. Jovie Swanner, NP-C

## 2022-12-22 DIAGNOSIS — R3129 Other microscopic hematuria: Secondary | ICD-10-CM | POA: Diagnosis not present

## 2022-12-22 DIAGNOSIS — R399 Unspecified symptoms and signs involving the genitourinary system: Secondary | ICD-10-CM | POA: Diagnosis not present

## 2023-01-02 DIAGNOSIS — N281 Cyst of kidney, acquired: Secondary | ICD-10-CM | POA: Diagnosis not present

## 2023-01-02 DIAGNOSIS — R3129 Other microscopic hematuria: Secondary | ICD-10-CM | POA: Diagnosis not present

## 2023-01-02 DIAGNOSIS — Z87440 Personal history of urinary (tract) infections: Secondary | ICD-10-CM | POA: Diagnosis not present

## 2023-01-02 DIAGNOSIS — N952 Postmenopausal atrophic vaginitis: Secondary | ICD-10-CM | POA: Diagnosis not present

## 2023-01-04 ENCOUNTER — Ambulatory Visit (INDEPENDENT_AMBULATORY_CARE_PROVIDER_SITE_OTHER): Payer: Medicare Other | Admitting: Adult Health

## 2023-01-04 ENCOUNTER — Encounter (INDEPENDENT_AMBULATORY_CARE_PROVIDER_SITE_OTHER): Payer: Self-pay | Admitting: Adult Health

## 2023-01-04 VITALS — BP 135/86 | HR 76 | Temp 97.7°F | Ht 62.0 in | Wt 157.6 lb

## 2023-01-04 DIAGNOSIS — Z6828 Body mass index (BMI) 28.0-28.9, adult: Secondary | ICD-10-CM | POA: Diagnosis not present

## 2023-01-04 DIAGNOSIS — N1831 Chronic kidney disease, stage 3a: Secondary | ICD-10-CM | POA: Diagnosis not present

## 2023-01-04 DIAGNOSIS — E7849 Other hyperlipidemia: Secondary | ICD-10-CM

## 2023-01-04 DIAGNOSIS — E559 Vitamin D deficiency, unspecified: Secondary | ICD-10-CM

## 2023-01-04 DIAGNOSIS — Z6830 Body mass index (BMI) 30.0-30.9, adult: Secondary | ICD-10-CM | POA: Diagnosis not present

## 2023-01-04 DIAGNOSIS — E669 Obesity, unspecified: Secondary | ICD-10-CM

## 2023-01-04 DIAGNOSIS — R7303 Prediabetes: Secondary | ICD-10-CM

## 2023-01-05 LAB — COMPREHENSIVE METABOLIC PANEL
ALT: 19 IU/L (ref 0–32)
AST: 23 IU/L (ref 0–40)
Albumin/Globulin Ratio: 1.7 (ref 1.2–2.2)
Albumin: 4.3 g/dL (ref 3.9–4.9)
Alkaline Phosphatase: 73 IU/L (ref 44–121)
BUN/Creatinine Ratio: 18 (ref 12–28)
BUN: 19 mg/dL (ref 8–27)
Bilirubin Total: 0.8 mg/dL (ref 0.0–1.2)
CO2: 24 mmol/L (ref 20–29)
Calcium: 9.8 mg/dL (ref 8.7–10.3)
Chloride: 99 mmol/L (ref 96–106)
Creatinine, Ser: 1.05 mg/dL — ABNORMAL HIGH (ref 0.57–1.00)
Globulin, Total: 2.6 g/dL (ref 1.5–4.5)
Glucose: 98 mg/dL (ref 70–99)
Potassium: 4.2 mmol/L (ref 3.5–5.2)
Sodium: 138 mmol/L (ref 134–144)
Total Protein: 6.9 g/dL (ref 6.0–8.5)
eGFR: 57 mL/min/{1.73_m2} — ABNORMAL LOW (ref >59)

## 2023-01-05 LAB — HEMOGLOBIN A1C
Est. average glucose Bld gHb Est-mCnc: 123 mg/dL
Hgb A1c MFr Bld: 5.9 % — ABNORMAL HIGH (ref 4.8–5.6)

## 2023-01-05 LAB — LIPID PANEL WITH LDL/HDL RATIO
Cholesterol, Total: 185 mg/dL (ref 100–199)
HDL: 60 mg/dL (ref >39)
LDL Chol Calc (NIH): 107 mg/dL — ABNORMAL HIGH (ref 0–99)
LDL/HDL Ratio: 1.8 ratio (ref 0.0–3.2)
Triglycerides: 103 mg/dL (ref 0–149)
VLDL Cholesterol Cal: 18 mg/dL (ref 5–40)

## 2023-01-05 LAB — INSULIN, RANDOM: INSULIN: 11 u[IU]/mL (ref 2.6–24.9)

## 2023-01-05 LAB — VITAMIN D 25 HYDROXY (VIT D DEFICIENCY, FRACTURES): Vit D, 25-Hydroxy: 59.5 ng/mL (ref 30.0–100.0)

## 2023-01-16 NOTE — Progress Notes (Unsigned)
Chief Complaint:   OBESITY Anita Randolph is here to discuss her progress with her obesity treatment plan along with follow-up of her obesity related diagnoses. Anita Randolph is on keeping a food journal and adhering to recommended goals of 1100 calories and 100 protein and states she is following her eating plan approximately 50% of the time. Anita Randolph states she is walking and going to the gym 60 minutes 3-5 times per week.  Today's visit was #: 33 Starting weight: 168 lbs Starting date: 03/24/021 Today's weight: 157 lbs Today's date: 01/04/2023 Total lbs lost to date: 11 lbs Total lbs lost since last in-office visit: +1 lb  Interim History: ***  Subjective:   1. Vitamin D deficiency Patient is currently on OTC vitamin D3 1000 IU and ***  2. Prediabetes A1c *** Patient is not on any blood glucose lowering medications.  3. Other hyperlipidemia Patient is on daily Lipitor 40 mg.  Patient denies myalgias.  4. Stage 3a chronic kidney disease ([place]) Patient denies NSAID use.  Assessment/Plan:   1. Vitamin D deficiency Check labs today.  - VITAMIN D 25 Hydroxy (Vit-D Deficiency, Fractures)  2. Prediabetes Check labs today.  - Hemoglobin A1c - Insulin, random  3. Other hyperlipidemia Check labs today.  - Comprehensive metabolic panel - Lipid Panel With LDL/HDL Ratio  4. Stage 3a chronic kidney disease ([place]) Continue to avoid nephrotoxic substances.  5. Obesity, current BMI 28.8  Check labs today.  - Comprehensive metabolic panel  Anita Randolph is currently in the action stage of change. As such, her goal is to continue with weight loss efforts. She has agreed to keeping a food journal and adhering to recommended goals of 1100 calories and 100 protein.   Exercise goals:  As is.  Behavioral modification strategies: increasing lean protein intake, decreasing simple carbohydrates, meal planning and cooking strategies, keeping healthy foods in the home, and planning for  success.  Anita Randolph has agreed to follow-up with our clinic in 4 weeks. She was informed of the importance of frequent follow-up visits to maximize her success with intensive lifestyle modifications for her multiple health conditions.   Objective:   Blood pressure 135/86, pulse 76, temperature 97.7 F (36.5 C), height 5' 2"$  (1.575 m), weight 157 lb 9.6 oz (71.5 kg), last menstrual period 11/28/2004, SpO2 97 %. Body mass index is 28.83 kg/m.  General: Cooperative, alert, well developed, in no acute distress. HEENT: Conjunctivae and lids unremarkable. Cardiovascular: Regular rhythm.  Lungs: Normal work of breathing. Neurologic: No focal deficits.   Lab Results  Component Value Date   CREATININE 1.05 (H) 01/04/2023   BUN 19 01/04/2023   NA 138 01/04/2023   K 4.2 01/04/2023   CL 99 01/04/2023   CO2 24 01/04/2023   Lab Results  Component Value Date   ALT 19 01/04/2023   AST 23 01/04/2023   ALKPHOS 73 01/04/2023   BILITOT 0.8 01/04/2023   Lab Results  Component Value Date   HGBA1C 5.9 (H) 01/04/2023   HGBA1C 5.8 (H) 09/21/2022   HGBA1C 5.7 (H) 04/13/2022   HGBA1C 5.7 (H) 12/21/2021   HGBA1C 5.9 (H) 09/13/2021   Lab Results  Component Value Date   INSULIN 11.0 01/04/2023   INSULIN 11.0 09/21/2022   INSULIN 13.5 04/13/2022   INSULIN 8.9 09/13/2021   INSULIN 8.2 05/26/2021   Lab Results  Component Value Date   TSH 1.040 04/13/2022   Lab Results  Component Value Date   CHOL 185 01/04/2023   HDL 60 01/04/2023  LDLCALC 107 (H) 01/04/2023   TRIG 103 01/04/2023   CHOLHDL 3.2 09/21/2022   Lab Results  Component Value Date   VD25OH 59.5 01/04/2023   VD25OH 64.6 09/21/2022   VD25OH 65.7 12/21/2021   Lab Results  Component Value Date   WBC 8.5 10/10/2020   HGB 14.8 10/10/2020   HCT 44.8 10/10/2020   MCV 84.2 10/10/2020   PLT 274 10/10/2020   No results found for: "IRON", "TIBC", "FERRITIN"  Attestation Statements:   Reviewed by clinician on day of visit:  allergies, medications, problem list, medical history, surgical history, family history, social history, and previous encounter notes.  I, Davy Pique, RMA, am acting as Location manager for Mina Marble, NP.  I have reviewed the above documentation for accuracy and completeness, and I agree with the above. -  ***

## 2023-01-31 DIAGNOSIS — Z961 Presence of intraocular lens: Secondary | ICD-10-CM | POA: Diagnosis not present

## 2023-01-31 DIAGNOSIS — H5212 Myopia, left eye: Secondary | ICD-10-CM | POA: Diagnosis not present

## 2023-01-31 DIAGNOSIS — H10413 Chronic giant papillary conjunctivitis, bilateral: Secondary | ICD-10-CM | POA: Diagnosis not present

## 2023-01-31 DIAGNOSIS — H33301 Unspecified retinal break, right eye: Secondary | ICD-10-CM | POA: Diagnosis not present

## 2023-01-31 DIAGNOSIS — H524 Presbyopia: Secondary | ICD-10-CM | POA: Diagnosis not present

## 2023-02-01 DIAGNOSIS — Z7983 Long term (current) use of bisphosphonates: Secondary | ICD-10-CM | POA: Diagnosis not present

## 2023-02-01 DIAGNOSIS — M8588 Other specified disorders of bone density and structure, other site: Secondary | ICD-10-CM | POA: Diagnosis not present

## 2023-02-01 DIAGNOSIS — N958 Other specified menopausal and perimenopausal disorders: Secondary | ICD-10-CM | POA: Diagnosis not present

## 2023-02-01 DIAGNOSIS — R29890 Loss of height: Secondary | ICD-10-CM | POA: Diagnosis not present

## 2023-02-06 ENCOUNTER — Ambulatory Visit (INDEPENDENT_AMBULATORY_CARE_PROVIDER_SITE_OTHER): Payer: Medicare Other | Admitting: Adult Health

## 2023-02-06 ENCOUNTER — Encounter (INDEPENDENT_AMBULATORY_CARE_PROVIDER_SITE_OTHER): Payer: Self-pay | Admitting: Adult Health

## 2023-02-06 VITALS — BP 138/85 | HR 69 | Temp 97.9°F | Ht 62.0 in | Wt 157.0 lb

## 2023-02-06 DIAGNOSIS — Z6828 Body mass index (BMI) 28.0-28.9, adult: Secondary | ICD-10-CM

## 2023-02-06 DIAGNOSIS — N1831 Chronic kidney disease, stage 3a: Secondary | ICD-10-CM

## 2023-02-06 DIAGNOSIS — E7849 Other hyperlipidemia: Secondary | ICD-10-CM

## 2023-02-06 DIAGNOSIS — Z6830 Body mass index (BMI) 30.0-30.9, adult: Secondary | ICD-10-CM

## 2023-02-06 DIAGNOSIS — E559 Vitamin D deficiency, unspecified: Secondary | ICD-10-CM

## 2023-02-06 DIAGNOSIS — R7303 Prediabetes: Secondary | ICD-10-CM | POA: Diagnosis not present

## 2023-02-06 DIAGNOSIS — E669 Obesity, unspecified: Secondary | ICD-10-CM

## 2023-02-06 NOTE — Progress Notes (Signed)
WEIGHT SUMMARY AND BIOMETRICS  Vitals Temp: 97.9 F (36.6 C) BP: 138/85 Pulse Rate: 69 SpO2: 99 %   Anthropometric Measurements Height: '5\' 2"'$  (1.575 m) Weight: 157 lb (71.2 kg) BMI (Calculated): 28.71 Weight at Last Visit: 157lb Weight Lost Since Last Visit: 0 Starting Weight: 168lb Total Weight Loss (lbs): 11 lb (4.99 kg)   Body Composition  Body Fat %: 37.1 % Fat Mass (lbs): 58.2 lbs Muscle Mass (lbs): 93.8 lbs Total Body Water (lbs): 62.8 lbs Visceral Fat Rating : 10   Other Clinical Data Fasting: yes Labs: no Today's Visit #: 24 Starting Date: 02/19/20    Chief Complaint:   OBESITY Anita Randolph is here to discuss her progress with her obesity treatment plan. She is on the keeping a food journal and adhering to recommended goals of 1100 calories and 100 protein and states she is following her eating plan approximately 25 % of the time. She states she is exercising walking or gym exercise 60 minutes 5 times per week.   Interim History:  Anita Randolph continues to live a very active lifestyle- travel and visiting family and friends all over the [place]. She and her husband will travel to [facility] for 7 days next week. She and her husband will travel to [place] for several weeks in April.  When travelling she endorses walking frequently.  Subjective:   1. Vitamin D deficiency Discussed Labs  Latest Reference Range & Units 01/04/23 08:43  Vitamin D, 25-Hydroxy 30.0 - 100.0 ng/mL 59.5  She is on daily Calcium Carbonate/Vit D 600/400 and OTC Vit D3 1000 IU She endorses stable energy level.  2. Stage 3a chronic kidney disease (Anita Randolph) Discussed Labs 01/04/2023 CMP: Serum Creat 1.05 GFR 57 Both levels improved! She denies NSAID use  3. Other hyperlipidemia Discussed Labs Lipid Panel     Component Value Date/Time   CHOL 185 01/04/2023 0843   TRIG 103 01/04/2023 0843   HDL 60 01/04/2023 0843   CHOLHDL 3.2 09/21/2022 0937   LDLCALC 107 (H)  01/04/2023 0843   LABVLDL 18 01/04/2023 0843   She is on daily Lipitor '40mg'$  - denies myalgias   4. Prediabetes Discussed Labs Lab Results  Component Value Date   HGBA1C 5.9 (H) 01/04/2023   HGBA1C 5.8 (H) 09/21/2022   HGBA1C 5.7 (H) 04/13/2022     Latest Reference Range & Units 01/04/23 08:43  Glucose 70 - 99 mg/dL 98  Hemoglobin A1C 4.8 - 5.6 % 5.9 (H)  Est. average glucose Bld gHb Est-mCnc mg/dL 123  INSULIN 2.6 - 24.9 uIU/mL 11.0  (H): Data is abnormally high  She is not currently on any blood glucose lowering medications.  Assessment/Plan:   1. Vitamin D deficiency Continue daily supplementation  2. Stage 3a chronic kidney disease (Anita Randolph) Monitor labs Continue to avoid nephrotoxic substances  3. Other hyperlipidemia Continue daily Lipitor '40mg'$  and reduce saturate fat.  4. Prediabetes Continue to reduce sugar/CHO and increase protein.  5. Obesity, current BMI 28.71  Anita Randolph is currently in the action stage of change. As such, her goal is to continue with weight loss efforts. She has agreed to keeping a food journal and adhering to recommended goals of 1100 calories and 100 protein.   Exercise goals: Older adults should determine their level of effort for physical activity relative to their level of fitness.   Behavioral modification strategies: increasing lean protein intake, decreasing simple carbohydrates, increasing vegetables, increasing water intake, decreasing eating out, no skipping meals,  meal planning and cooking strategies, keeping healthy foods in the home, planning for success, and keeping a strict food journal.  Anita Randolph has agreed to follow-up with our clinic in 4 weeks. She was informed of the importance of frequent follow-up visits to maximize her success with intensive lifestyle modifications for her multiple health conditions.   Objective:   Blood pressure 138/85, pulse 69, temperature 97.9 F (36.6 C), height '5\' 2"'$  (1.575 m), weight 157 lb (71.2 kg),  last menstrual period 11/28/2004, SpO2 99 %. Body mass index is 28.72 kg/m.  General: Cooperative, alert, well developed, in no acute distress. HEENT: Conjunctivae and lids unremarkable. Cardiovascular: Regular rhythm.  Lungs: Normal work of breathing. Neurologic: No focal deficits.   Lab Results  Component Value Date   CREATININE 1.05 (H) 01/04/2023   BUN 19 01/04/2023   NA 138 01/04/2023   K 4.2 01/04/2023   CL 99 01/04/2023   CO2 24 01/04/2023   Lab Results  Component Value Date   ALT 19 01/04/2023   AST 23 01/04/2023   ALKPHOS 73 01/04/2023   BILITOT 0.8 01/04/2023   Lab Results  Component Value Date   HGBA1C 5.9 (H) 01/04/2023   HGBA1C 5.8 (H) 09/21/2022   HGBA1C 5.7 (H) 04/13/2022   HGBA1C 5.7 (H) 12/21/2021   HGBA1C 5.9 (H) 09/13/2021   Lab Results  Component Value Date   INSULIN 11.0 01/04/2023   INSULIN 11.0 09/21/2022   INSULIN 13.5 04/13/2022   INSULIN 8.9 09/13/2021   INSULIN 8.2 05/26/2021   Lab Results  Component Value Date   TSH 1.040 04/13/2022   Lab Results  Component Value Date   CHOL 185 01/04/2023   HDL 60 01/04/2023   LDLCALC 107 (H) 01/04/2023   TRIG 103 01/04/2023   CHOLHDL 3.2 09/21/2022   Lab Results  Component Value Date   VD25OH 59.5 01/04/2023   VD25OH 64.6 09/21/2022   VD25OH 65.7 12/21/2021   Lab Results  Component Value Date   WBC 8.5 10/10/2020   HGB 14.8 10/10/2020   HCT 44.8 10/10/2020   MCV 84.2 10/10/2020   PLT 274 10/10/2020   No results found for: "IRON", "TIBC", "FERRITIN"   Attestation Statements:   Reviewed by clinician on day of visit: allergies, medications, problem list, medical history, surgical history, family history, social history, and previous encounter notes.  I have reviewed the above documentation for accuracy and completeness, and I agree with the above. -  Naydeen Speirs d. Alleene Stoy, NP-C

## 2023-02-13 ENCOUNTER — Other Ambulatory Visit: Payer: Medicare Other

## 2023-02-21 ENCOUNTER — Ambulatory Visit
Admission: RE | Admit: 2023-02-21 | Discharge: 2023-02-21 | Disposition: A | Discharge status: Home / Self Care | Payer: Medicare Other | Source: Ambulatory Visit | Attending: Obstetrics and Gynecology | Admitting: Obstetrics and Gynecology

## 2023-02-21 DIAGNOSIS — Z129 Encounter for screening for malignant neoplasm, site unspecified: Secondary | ICD-10-CM

## 2023-02-21 DIAGNOSIS — R922 Inconclusive mammogram: Secondary | ICD-10-CM | POA: Diagnosis not present

## 2023-02-21 DIAGNOSIS — Z853 Personal history of malignant neoplasm of breast: Secondary | ICD-10-CM | POA: Diagnosis not present

## 2023-02-21 MED ORDER — GADOPICLENOL 0.5 MMOL/ML IV SOLN
7.5000 mL | Freq: Once | INTRAVENOUS | Status: AC | PRN
  Administered 2023-02-21: 7.5 mL via INTRAVENOUS

## 2023-02-28 DIAGNOSIS — R350 Frequency of micturition: Secondary | ICD-10-CM | POA: Diagnosis not present

## 2023-02-28 DIAGNOSIS — R3915 Urgency of urination: Secondary | ICD-10-CM | POA: Diagnosis not present

## 2023-03-07 ENCOUNTER — Ambulatory Visit (INDEPENDENT_AMBULATORY_CARE_PROVIDER_SITE_OTHER): Payer: Medicare Other | Admitting: Adult Health

## 2023-03-09 ENCOUNTER — Ambulatory Visit (INDEPENDENT_AMBULATORY_CARE_PROVIDER_SITE_OTHER): Payer: Medicare Other | Admitting: Adult Health

## 2023-03-29 ENCOUNTER — Ambulatory Visit (INDEPENDENT_AMBULATORY_CARE_PROVIDER_SITE_OTHER): Payer: Medicare Other | Admitting: Adult Health

## 2023-03-29 ENCOUNTER — Encounter (INDEPENDENT_AMBULATORY_CARE_PROVIDER_SITE_OTHER): Payer: Self-pay | Admitting: Adult Health

## 2023-03-29 VITALS — BP 128/82 | HR 68 | Temp 97.7°F | Ht 62.0 in | Wt 157.0 lb

## 2023-03-29 DIAGNOSIS — E782 Mixed hyperlipidemia: Secondary | ICD-10-CM | POA: Diagnosis not present

## 2023-03-29 DIAGNOSIS — Z0000 Encounter for general adult medical examination without abnormal findings: Secondary | ICD-10-CM

## 2023-03-29 DIAGNOSIS — E669 Obesity, unspecified: Secondary | ICD-10-CM

## 2023-03-29 DIAGNOSIS — Z6828 Body mass index (BMI) 28.0-28.9, adult: Secondary | ICD-10-CM | POA: Diagnosis not present

## 2023-03-29 MED ORDER — ATORVASTATIN CALCIUM 40 MG PO TABS: 40.0000 mg | ORAL_TABLET | Freq: Every day | ORAL | 0 refills | Status: DC

## 2023-03-29 NOTE — Progress Notes (Signed)
WEIGHT SUMMARY AND BIOMETRICS  Vitals Temp: 97.7 F (36.5 C) BP: 128/82 Pulse Rate: 68 SpO2: 97 %   Anthropometric Measurements Height: 5\' 2"  (1.575 m) Weight: 157 lb (71.2 kg) BMI (Calculated): 28.71 Weight at Last Visit: 157lb Weight Lost Since Last Visit: 0 Weight Gained Since Last Visit: 0 Starting Weight: 168lb Total Weight Loss (lbs): 11 lb (4.99 kg)   Body Composition  Body Fat %: 38.1 % Fat Mass (lbs): 60.2 lbs Muscle Mass (lbs): 92.6 lbs Total Body Water (lbs): 64.6 lbs Visceral Fat Rating : 11   Other Clinical Data Fasting: yes Labs: no Today's Visit #: 37 Starting Date: 02/19/20    Chief Complaint:   OBESITY Anita Randolph is here to discuss her progress with her obesity treatment plan. She is on the keeping a food journal and adhering to recommended goals of 1100 calories and 100 protein and states she is following her eating plan approximately 50 % of the time.  She states she is exercising Walking 60+ minutes 6-7 times per week.   Interim History:   01/20/22 08:00  RMR 1267    12/06/22 07:00  RMR 1253   Anita Randolph continues travel frequently. When she is home she will exercise at [facility] seeral times a wekk. When she is away from [facility]- she will remain active with walking, 12K-18K steps/day.  She strives to drink 40-60 oz water/day  Overall, she endorses good-great health levels.  Subjective:   1. Healthcare maintenance Muscle Mass (MM) 92 lbs Adipose Mass (AM) 60 lbs MM > AM 92 > 60 Visceral Adipose Rating 11: at goal Current weight 157, with corresponding BMI 28 EPIC review demonstrates that BP has been at goal consistently >12 months- without medication   2. Mixed hyperlipidemia Lipid Panel     Component Value Date/Time   CHOL 185 01/04/2023 0843   TRIG 103 01/04/2023 0843   HDL 60 01/04/2023 0843   CHOLHDL 3.2 09/21/2022 0937   LDLCALC 107 (H) 01/04/2023 0843   LABVLDL 18 01/04/2023 0843   The 10-year  ASCVD risk score (Arnett DK, et al., 2019) is: 9.1%   Values used to calculate the score:     Age: 72 years     Sex: Female     Is Non-Hispanic African American: No     Diabetic: No     Tobacco smoker: No     Systolic Blood Pressure: 128 mmHg     Is BP treated: No     HDL Cholesterol: 60 mg/dL     Total Cholesterol: 185 mg/dL  She is currently on daily Atorvastatin 40mg - denies myalgias. She denies tobacco/vape use. She denies CP with exertion.   Assessment/Plan:   1. Healthcare maintenance Check Fasting Labs at next OV  2. Mixed hyperlipidemia Refill - atorvastatin (LIPITOR) 40 MG tablet; Take 1 tablet (40 mg total) by mouth daily.  Dispense: 90 tablet; Refill: 0  3. Obesity, current BMI 28.71  Anita Randolph is currently in the action stage of change. As such, her goal is to continue with weight loss efforts. She has agreed to keeping a food journal and adhering to recommended goals of 1100 calories and 100 protein.   Exercise goals: At [facility]: Walk Briskly 60+ mins 5 days week. Strength Train 2 x week-Detailed H/O provided  Behavioral modification strategies: increasing lean protein intake, decreasing simple carbohydrates, increasing vegetables, increasing water intake, no skipping meals, meal planning and cooking strategies, planning for success, and keeping a strict food  journal.  Anita Randolph has agreed to follow-up with our clinic in 8 weeks. She was informed of the importance of frequent follow-up visits to maximize her success with intensive lifestyle modifications for her multiple health conditions.   Check Fasting Labs at Next OV  Objective:   Blood pressure 128/82, pulse 68, temperature 97.7 F (36.5 C), height 5\' 2"  (1.575 m), weight 157 lb (71.2 kg), last menstrual period 11/28/2004, SpO2 97 %. Body mass index is 28.72 kg/m.  General: Cooperative, alert, well developed, in no acute distress. HEENT: Conjunctivae and lids unremarkable. Cardiovascular: Regular rhythm.  Lungs:  Normal work of breathing. Neurologic: No focal deficits.   Lab Results  Component Value Date   CREATININE 1.05 (H) 01/04/2023   BUN 19 01/04/2023   NA 138 01/04/2023   K 4.2 01/04/2023   CL 99 01/04/2023   CO2 24 01/04/2023   Lab Results  Component Value Date   ALT 19 01/04/2023   AST 23 01/04/2023   ALKPHOS 73 01/04/2023   BILITOT 0.8 01/04/2023   Lab Results  Component Value Date   HGBA1C 5.9 (H) 01/04/2023   HGBA1C 5.8 (H) 09/21/2022   HGBA1C 5.7 (H) 04/13/2022   HGBA1C 5.7 (H) 12/21/2021   HGBA1C 5.9 (H) 09/13/2021   Lab Results  Component Value Date   INSULIN 11.0 01/04/2023   INSULIN 11.0 09/21/2022   INSULIN 13.5 04/13/2022   INSULIN 8.9 09/13/2021   INSULIN 8.2 05/26/2021   Lab Results  Component Value Date   TSH 1.040 04/13/2022   Lab Results  Component Value Date   CHOL 185 01/04/2023   HDL 60 01/04/2023   LDLCALC 107 (H) 01/04/2023   TRIG 103 01/04/2023   CHOLHDL 3.2 09/21/2022   Lab Results  Component Value Date   VD25OH 59.5 01/04/2023   VD25OH 64.6 09/21/2022   VD25OH 65.7 12/21/2021   Lab Results  Component Value Date   WBC 8.5 10/10/2020   HGB 14.8 10/10/2020   HCT 44.8 10/10/2020   MCV 84.2 10/10/2020   PLT 274 10/10/2020   No results found for: "IRON", "TIBC", "FERRITIN"  Attestation Statements:   Reviewed by clinician on day of visit: allergies, medications, problem list, medical history, surgical history, family history, social history, and previous encounter notes.  I have reviewed the above documentation for accuracy and completeness, and I agree with the above. -  Kaleem Sartwell d. Ly Wass, NP-C

## 2023-05-17 ENCOUNTER — Ambulatory Visit (INDEPENDENT_AMBULATORY_CARE_PROVIDER_SITE_OTHER): Payer: Medicare Other | Admitting: Adult Health

## 2023-05-31 DIAGNOSIS — Z23 Encounter for immunization: Secondary | ICD-10-CM | POA: Diagnosis not present

## 2023-06-05 ENCOUNTER — Other Ambulatory Visit (INDEPENDENT_AMBULATORY_CARE_PROVIDER_SITE_OTHER): Payer: Self-pay | Admitting: Adult Health

## 2023-06-05 ENCOUNTER — Encounter (INDEPENDENT_AMBULATORY_CARE_PROVIDER_SITE_OTHER): Payer: Self-pay | Admitting: Adult Health

## 2023-06-05 ENCOUNTER — Ambulatory Visit (INDEPENDENT_AMBULATORY_CARE_PROVIDER_SITE_OTHER): Payer: Medicare Other | Admitting: Adult Health

## 2023-06-05 VITALS — BP 116/78 | HR 73 | Temp 97.7°F | Ht 62.0 in | Wt 155.0 lb

## 2023-06-05 DIAGNOSIS — E559 Vitamin D deficiency, unspecified: Secondary | ICD-10-CM

## 2023-06-05 DIAGNOSIS — E782 Mixed hyperlipidemia: Secondary | ICD-10-CM | POA: Diagnosis not present

## 2023-06-05 DIAGNOSIS — R7303 Prediabetes: Secondary | ICD-10-CM | POA: Diagnosis not present

## 2023-06-05 DIAGNOSIS — N1831 Chronic kidney disease, stage 3a: Secondary | ICD-10-CM

## 2023-06-05 DIAGNOSIS — Z0000 Encounter for general adult medical examination without abnormal findings: Secondary | ICD-10-CM | POA: Diagnosis not present

## 2023-06-05 DIAGNOSIS — E669 Obesity, unspecified: Secondary | ICD-10-CM | POA: Diagnosis not present

## 2023-06-05 DIAGNOSIS — Z6828 Body mass index (BMI) 28.0-28.9, adult: Secondary | ICD-10-CM | POA: Diagnosis not present

## 2023-06-05 MED ORDER — WEGOVY 0.25 MG/0.5ML ~~LOC~~ SOAJ: 0.2500 mg | SUBCUTANEOUS | 0 refills | Status: DC

## 2023-06-05 NOTE — Progress Notes (Signed)
WEIGHT SUMMARY AND BIOMETRICS  Vitals Temp: 97.7 F (36.5 C) BP: 116/78 Pulse Rate: 73 SpO2: 98 %   Anthropometric Measurements Height: 5\' 2"  (1.575 m) Weight: 155 lb (70.3 kg) BMI (Calculated): 28.34 Weight at Last Visit: 157lb Weight Lost Since Last Visit: 2lb Weight Gained Since Last Visit: 0 Starting Weight: 168lb Total Weight Loss (lbs): 13 lb (5.897 kg)   Body Composition  Body Fat %: 36.7 % Fat Mass (lbs): 57.2 lbs Muscle Mass (lbs): 93.4 lbs Visceral Fat Rating : 10   Other Clinical Data Fasting: Yes Labs: Yes Today's Visit #: 46 Starting Date: 02/19/23    Chief Complaint:   OBESITY Anita Randolph is here to discuss her progress with her obesity treatment plan. She is on the keeping a food journal and adhering to recommended goals of 1100 calories and 100 protein and states she is following her eating plan approximately 70 % of the time. She states she is exercising Walking and Strength Training 60 minutes 5 times per week.   Interim History:  Anita Randolph have remained above goals despite years of healthy eating and regular exercise. She has resumed tracking dietary intake with [employer] APP  Reviewed Bioimpedance results with pt: Muscle Mass: + 0.8 lbs Adipose Mass: - 3 lbs  Exercise-she estimates to walk > 10K steps/day and Strength Trains 5 x week Hydration-she estimates to drink >64 oz water/day  Subjective:   1. Vitamin D deficiency  Latest Reference Range & Units 12/21/21 08:27 09/21/22 09:37 01/04/23 08:43  Vitamin D, 25-Hydroxy 30.0 - 100.0 ng/mL 65.7 64.6 59.5  She is currently on daily OTC Vit D3 1,000 international units every day She endorses stable energy Randolph  2. Healthcare maintenance She denies family hx of MEN 2 ot MTC She denies personal hx of pancreatitis Discussed benefits/risks of GLP-1 therapy, Reginal Lutes She has HLD, HTN, CKD, Obesity  3. Mixed hyperlipidemia Lipid Panel     Component Value Date/Time    CHOL 185 01/04/2023 0843   TRIG 103 01/04/2023 0843   HDL 60 01/04/2023 0843   CHOLHDL 3.2 09/21/2022 0937   LDLCALC 107 (H) 01/04/2023 0843   LABVLDL 18 01/04/2023 0843   The 10-year ASCVD risk score (Arnett DK, et al., 2019) is: 7.6%   Values used to calculate the score:     Age: 72 years     Sex: Female     Is Non-Hispanic African American: No     Diabetic: No     Tobacco smoker: No     Systolic Blood Pressure: 116 mmHg     Is BP treated: No     HDL Cholesterol: 60 mg/dL     Total Cholesterol: 185 mg/dL  She is currently on daily Atorvastatin 40mg  every day- denies myalgia's  She denies tobacco/vape use  4. Stage 3a chronic kidney disease (HCC)  Latest Reference Range & Units 09/13/21 10:11 12/21/21 08:27 04/13/22 09:14 09/21/22 09:37 01/04/23 08:43  eGFR >59 mL/min/1.73 56 (L) 61 62 51 (L) 57 (L)  (L): Data is abnormally low She is not on ACE or ARB therapy  5. Prediabetes Lab Results  Component Value Date   HGBA1C 5.9 (H) 01/04/2023   HGBA1C 5.8 (H) 09/21/2022   HGBA1C 5.7 (H) 04/13/2022   Despite years of consistent healthy eating and regular exercise- her A1c and insulin Randolph remained above goal.   Assessment/Plan:   1. Vitamin D deficiency Check Labs - VITAMIN D 25 Hydroxy (Vit-D Deficiency, Fractures)  2. Healthcare maintenance Check Labs - COMPLETE METABOLIC PANEL WITH GFR - TSH + free T4  3. Mixed hyperlipidemia Check Labs - Lipid panel Start GLP-1 therapy  4. Stage 3a chronic kidney disease (HCC) Check Labs - COMPLETE METABOLIC PANEL WITH GFR Avoid Nephrotoxic substances  5. Prediabetes Check Labs - Hemoglobin A1c - Insulin, random Start GLP-1 therapy  6. Obesity, current BMI 28.34 Start Wegovy 0.25mg  once weekly injection Disp 2ml RF 0  Anita Randolph is currently in the action stage of change. As such, her goal is to continue with weight loss efforts. She has agreed to keeping a food journal and adhering to recommended goals of 1100 calories  and 100 protein.   Exercise goals: Older adults should follow the adult guidelines. When older adults cannot meet the adult guidelines, they should be as physically active as their abilities and conditions will allow.  Older adults should do exercises that maintain or improve balance if they are at risk of falling.  Older adults should determine their level of effort for physical activity relative to their level of fitness.   Behavioral modification strategies: increasing lean protein intake, decreasing simple carbohydrates, increasing vegetables, increasing water intake, decreasing liquid calories, no skipping meals, meal planning and cooking strategies, and planning for success.  Anita Randolph has agreed to follow-up with our clinic in 3 weeks. She was informed of the importance of frequent follow-up visits to maximize her success with intensive lifestyle modifications for her multiple health conditions.   Anita Randolph was informed we would discuss her lab results at her next visit unless there is a critical issue that needs to be addressed sooner. Anita Randolph agreed to keep her next visit at the agreed upon time to discuss these results.  Objective:   Blood pressure 116/78, pulse 73, temperature 97.7 F (36.5 C), height 5\' 2"  (1.575 m), weight 155 lb (70.3 kg), last menstrual period 11/28/2004, SpO2 98 %. Body mass index is 28.35 kg/m.  General: Cooperative, alert, well developed, in no acute distress. HEENT: Conjunctivae and lids unremarkable. Cardiovascular: Regular rhythm.  Lungs: Normal work of breathing. Neurologic: No focal deficits.   Lab Results  Component Value Date   CREATININE 1.05 (H) 01/04/2023   BUN 19 01/04/2023   NA 138 01/04/2023   K 4.2 01/04/2023   CL 99 01/04/2023   CO2 24 01/04/2023   Lab Results  Component Value Date   ALT 19 01/04/2023   AST 23 01/04/2023   ALKPHOS 73 01/04/2023   BILITOT 0.8 01/04/2023   Lab Results  Component Value Date   HGBA1C 5.9 (H) 01/04/2023    HGBA1C 5.8 (H) 09/21/2022   HGBA1C 5.7 (H) 04/13/2022   HGBA1C 5.7 (H) 12/21/2021   HGBA1C 5.9 (H) 09/13/2021   Lab Results  Component Value Date   INSULIN 11.0 01/04/2023   INSULIN 11.0 09/21/2022   INSULIN 13.5 04/13/2022   INSULIN 8.9 09/13/2021   INSULIN 8.2 05/26/2021   Lab Results  Component Value Date   TSH 1.040 04/13/2022   Lab Results  Component Value Date   CHOL 185 01/04/2023   HDL 60 01/04/2023   LDLCALC 107 (H) 01/04/2023   TRIG 103 01/04/2023   CHOLHDL 3.2 09/21/2022   Lab Results  Component Value Date   VD25OH 59.5 01/04/2023   VD25OH 64.6 09/21/2022   VD25OH 65.7 12/21/2021   Lab Results  Component Value Date   WBC 8.5 10/10/2020   HGB 14.8 10/10/2020   HCT 44.8 10/10/2020   MCV 84.2 10/10/2020  PLT 274 10/10/2020   No results found for: "IRON", "TIBC", "FERRITIN"   Attestation Statements:   Reviewed by clinician on day of visit: allergies, medications, problem list, medical history, surgical history, family history, social history, and previous encounter notes.  I have reviewed the above documentation for accuracy and completeness, and I agree with the above. -  Glen Kesinger d. Pamela Maddy, NP-C

## 2023-06-06 ENCOUNTER — Encounter (INDEPENDENT_AMBULATORY_CARE_PROVIDER_SITE_OTHER): Payer: Self-pay | Admitting: Adult Health

## 2023-06-06 ENCOUNTER — Telehealth (INDEPENDENT_AMBULATORY_CARE_PROVIDER_SITE_OTHER): Payer: Self-pay | Admitting: Adult Health

## 2023-06-06 LAB — LIPID PANEL
Chol/HDL Ratio: 3.3 ratio (ref 0.0–4.4)
Cholesterol, Total: 174 mg/dL (ref 100–199)
HDL: 52 mg/dL (ref >39)
LDL Chol Calc (NIH): 98 mg/dL (ref 0–99)
Triglycerides: 134 mg/dL (ref 0–149)
VLDL Cholesterol Cal: 24 mg/dL (ref 5–40)

## 2023-06-06 LAB — VITAMIN D 25 HYDROXY (VIT D DEFICIENCY, FRACTURES): Vit D, 25-Hydroxy: 64.3 ng/mL (ref 30.0–100.0)

## 2023-06-06 LAB — TSH+FREE T4
Free T4: 1.31 ng/dL (ref 0.82–1.77)
TSH: 2.03 u[IU]/mL (ref 0.450–4.500)

## 2023-06-06 LAB — HEMOGLOBIN A1C
Est. average glucose Bld gHb Est-mCnc: 120 mg/dL
Hgb A1c MFr Bld: 5.8 % — ABNORMAL HIGH (ref 4.8–5.6)

## 2023-06-06 LAB — INSULIN, RANDOM: INSULIN: 10.4 u[IU]/mL (ref 2.6–24.9)

## 2023-06-06 NOTE — Telephone Encounter (Signed)
PA submitted for Wegovy, awaiting determination.  

## 2023-06-07 ENCOUNTER — Telehealth (INDEPENDENT_AMBULATORY_CARE_PROVIDER_SITE_OTHER): Payer: Self-pay | Admitting: Adult Health

## 2023-06-07 DIAGNOSIS — Z86010 Personal history of colonic polyps: Secondary | ICD-10-CM | POA: Diagnosis not present

## 2023-06-07 DIAGNOSIS — K580 Irritable bowel syndrome with diarrhea: Secondary | ICD-10-CM | POA: Diagnosis not present

## 2023-06-07 DIAGNOSIS — K5790 Diverticulosis of intestine, part unspecified, without perforation or abscess without bleeding: Secondary | ICD-10-CM | POA: Diagnosis not present

## 2023-06-07 NOTE — Telephone Encounter (Signed)
New PA submitted for [facility] using patients new Medicare card. Waiting on a determination.

## 2023-06-12 ENCOUNTER — Encounter (INDEPENDENT_AMBULATORY_CARE_PROVIDER_SITE_OTHER): Payer: Medicare Other | Admitting: Ophthalmology

## 2023-06-12 NOTE — Telephone Encounter (Signed)
PA for [facility] was denied stating Medicare Part D does not cover weight loss medications. The patient may initiate an appeal with their insurance company.

## 2023-06-22 ENCOUNTER — Telehealth (INDEPENDENT_AMBULATORY_CARE_PROVIDER_SITE_OTHER): Payer: Self-pay | Admitting: Adult Health

## 2023-06-22 ENCOUNTER — Encounter (INDEPENDENT_AMBULATORY_CARE_PROVIDER_SITE_OTHER): Payer: Self-pay | Admitting: Adult Health

## 2023-06-22 NOTE — Telephone Encounter (Signed)
Call from [facility], spoke with Marisue Ivan the representative.  Representative is asking questions if the patient has CAD.  Spoke with Orpha Bur the NP and patient has history of hypertension, hyperlipidemia, which are components of CAD.  Marisue Ivan states that we will get a determination soon.

## 2023-06-23 ENCOUNTER — Encounter (INDEPENDENT_AMBULATORY_CARE_PROVIDER_SITE_OTHER): Payer: Medicare Other | Admitting: Ophthalmology

## 2023-06-23 DIAGNOSIS — H338 Other retinal detachments: Secondary | ICD-10-CM

## 2023-06-23 DIAGNOSIS — D3131 Benign neoplasm of right choroid: Secondary | ICD-10-CM

## 2023-06-23 DIAGNOSIS — H43812 Vitreous degeneration, left eye: Secondary | ICD-10-CM | POA: Diagnosis not present

## 2023-06-26 ENCOUNTER — Ambulatory Visit (INDEPENDENT_AMBULATORY_CARE_PROVIDER_SITE_OTHER): Payer: Medicare Other | Admitting: Adult Health

## 2023-06-26 ENCOUNTER — Encounter (INDEPENDENT_AMBULATORY_CARE_PROVIDER_SITE_OTHER): Payer: Self-pay | Admitting: Adult Health

## 2023-06-26 VITALS — BP 107/71 | HR 78 | Temp 97.8°F | Ht 62.0 in | Wt 156.0 lb

## 2023-06-26 DIAGNOSIS — R7303 Prediabetes: Secondary | ICD-10-CM | POA: Diagnosis not present

## 2023-06-26 DIAGNOSIS — Z0000 Encounter for general adult medical examination without abnormal findings: Secondary | ICD-10-CM

## 2023-06-26 DIAGNOSIS — N1831 Chronic kidney disease, stage 3a: Secondary | ICD-10-CM | POA: Diagnosis not present

## 2023-06-26 DIAGNOSIS — E782 Mixed hyperlipidemia: Secondary | ICD-10-CM

## 2023-06-26 DIAGNOSIS — E669 Obesity, unspecified: Secondary | ICD-10-CM

## 2023-06-26 DIAGNOSIS — E559 Vitamin D deficiency, unspecified: Secondary | ICD-10-CM | POA: Diagnosis not present

## 2023-06-26 DIAGNOSIS — Z6828 Body mass index (BMI) 28.0-28.9, adult: Secondary | ICD-10-CM

## 2023-06-26 MED ORDER — ZEPBOUND 2.5 MG/0.5ML ~~LOC~~ SOAJ: 2.5000 mg | SUBCUTANEOUS | 0 refills | Status: DC

## 2023-06-26 MED ORDER — ATORVASTATIN CALCIUM 40 MG PO TABS
40.0000 mg | ORAL_TABLET | Freq: Every day | ORAL | 0 refills | Status: DC
Start: 2023-06-26 — End: 2023-09-20

## 2023-06-26 NOTE — Progress Notes (Signed)
WEIGHT SUMMARY AND BIOMETRICS  Vitals Temp: 97.8 F (36.6 C) BP: 107/71 Pulse Rate: 78 SpO2: 97 %   Anthropometric Measurements Height: 5\' 2"  (1.575 m) Weight: 156 lb (70.8 kg) BMI (Calculated): 28.53 Weight at Last Visit: 155lb Weight Lost Since Last Visit: 0 Weight Gained Since Last Visit: 1lb Starting Weight: 168lb Total Weight Loss (lbs): 12 lb (5.443 kg)   Body Composition  Body Fat %: 37.1 % Fat Mass (lbs): 57.8 lbs Muscle Mass (lbs): 93.2 lbs Total Body Water (lbs): 64.2 lbs Visceral Fat Rating : 10   Other Clinical Data Fasting: no Labs: no Today's Visit #: 77 Starting Date: 02/19/23    Chief Complaint:   OBESITY Anita is here to discuss her progress with her obesity treatment plan. She is on the keeping a food journal and adhering to recommended goals of 1100 calories and 100 protein and states she is following her eating plan approximately 70 % of the time. She states she is exercising Walking and Strength Training 60 minutes 5 times per week.   Interim History:  Anita Randolph BMI 28.5 HLD Prediabetes  Lengthy discussion about risks/benefits of Wegovy at last OV. [facility] Rx sent in- denies. Patient initiated Rx Appeal Reginal Lutes approved, however cost prohibitive (>$900 month after insurance benefits applied).  Discussed alternative of Zepbound therapy- will attempt approval.  Subjective:   1. Prediabetes Discussed Labs Lab Results  Component Value Date   HGBA1C 5.8 (H) 06/05/2023   HGBA1C 5.9 (H) 01/04/2023   HGBA1C 5.8 (H) 09/21/2022     Latest Reference Range & Units 09/21/22 09:37 01/04/23 08:43  Creatinine 0.57 - 1.00 mg/dL 2.95 (H) 6.21 (H)  (H): Data is abnormally high  Latest Reference Range & Units 09/21/22 09:37 01/04/23 08:43  eGFR >59 mL/min/1.73 51 (L) 57 (L)  (L): Data is abnormally low She is not currently on any antidiabetic therapy. Her insurance approved [phone], however it is >900 dollars after insurance  coverage.  2. Mixed hyperlipidemia [facility] Lipid Panel     Component Value Date/Time   CHOL 174 06/05/2023 0948   TRIG 134 06/05/2023 0948   HDL 52 06/05/2023 0948   CHOLHDL 3.3 06/05/2023 0948   LDLCALC 98 06/05/2023 0948   LABVLDL 24 06/05/2023 0948   The 10-year ASCVD risk score (Arnett DK, et al., 2019) is: 6.6%   Values used to calculate the score:     Age: 72 years     Sex: Female     Is Non-Hispanic African American: No     Diabetic: No     Tobacco smoker: No     Systolic Blood Pressure: 107 mmHg     Is BP treated: No     HDL Cholesterol: 52 mg/dL     Total Cholesterol: 174 mg/dL  She is on daily Lipitor 40mg  every day- denies myalgias  3. Stage 3a chronic kidney disease [facility]) Discussed Labs  Latest Reference Range & Units 09/21/22 09:37 01/04/23 08:43  Creatinine 0.57 - 1.00 mg/dL 8.46 (H) 9.62 (H)  Calcium 8.7 - 10.3 mg/dL 95.2 9.8  BUN/Creatinine Ratio 12 - 28  15 18   eGFR >59 mL/min/1.73 51 (L) 57 (L)  (H): Data is abnormally high (L): Data is abnormally low  Levels stable She is not on ACE/ARB  4. Vitamin D deficiency Discussed Labs  Latest Reference Range & Units 01/04/23 08:43 06/05/23 09:48  Vitamin D, 25-Hydroxy 30.0 - 100.0 ng/mL 59.5 64.3  She is on   5. Healthcare  maintenance Discussed Labs  Latest Reference Range & Units 06/05/23 09:48  TSH 0.450 - 4.500 uIU/mL 2.030  T4,Free(Direct) 0.82 - 1.77 ng/dL 1.30   Levels stable  Assessment/Plan:   1. Prediabetes Continue to limit sugar/CHO and increase protein intake  2. Mixed hyperlipidemia Refill - atorvastatin (LIPITOR) 40 MG tablet; Take 1 tablet (40 mg total) by mouth daily.  Dispense: 90 tablet; Refill: 0  3. Stage 3a chronic kidney disease (HCC) Remain well hydrated Avoid Nephrotoxic substances  4. Vitamin D deficiency Continue OTC supplementation  5. Healthcare maintenance Monitor labs at appropriate intervals  6. Obesity, current BMI 28.34 Start tirzepatide  (ZEPBOUND) 2.5 MG/0.5ML Pen Inject 2.5 mg into the skin once a week. Dispense: 3 mL, Refills: 0 ordered   Anita Randolph is currently in the action stage of change. As such, her goal is to continue with weight loss efforts. She has agreed to keeping a food journal and adhering to recommended goals of 1100 calories and 100 protein.   Exercise goals: Older adults should follow the adult guidelines. When older adults cannot meet the adult guidelines, they should be as physically active as their abilities and conditions will allow.  Older adults should do exercises that maintain or improve balance if they are at risk of falling.  Older adults should determine their level of effort for physical activity relative to their level of fitness.   Behavioral modification strategies: increasing lean protein intake, decreasing simple carbohydrates, increasing vegetables, increasing water intake, no skipping meals, meal planning and cooking strategies, and planning for success.  Anita Randolph has agreed to follow-up with our clinic in 4 weeks. She was informed of the importance of frequent follow-up visits to maximize her success with intensive lifestyle modifications for her multiple health conditions.   Objective:   Blood pressure 107/71, pulse 78, temperature 97.8 F (36.6 C), height 5\' 2"  (1.575 m), weight 156 lb (70.8 kg), last menstrual period 11/28/2004, SpO2 97%. Body mass index is 28.53 kg/m.  General: Cooperative, alert, well developed, in no acute distress. HEENT: Conjunctivae and lids unremarkable. Cardiovascular: Regular rhythm.  Lungs: Normal work of breathing. Neurologic: No focal deficits.   Lab Results  Component Value Date   CREATININE 1.05 (H) 01/04/2023   BUN 19 01/04/2023   NA 138 01/04/2023   K 4.2 01/04/2023   CL 99 01/04/2023   CO2 24 01/04/2023   Lab Results  Component Value Date   ALT 19 01/04/2023   AST 23 01/04/2023   ALKPHOS 73 01/04/2023   BILITOT 0.8 01/04/2023   Lab Results   Component Value Date   HGBA1C 5.8 (H) 06/05/2023   HGBA1C 5.9 (H) 01/04/2023   HGBA1C 5.8 (H) 09/21/2022   HGBA1C 5.7 (H) 04/13/2022   HGBA1C 5.7 (H) 12/21/2021   Lab Results  Component Value Date   INSULIN 10.4 06/05/2023   INSULIN 11.0 01/04/2023   INSULIN 11.0 09/21/2022   INSULIN 13.5 04/13/2022   INSULIN 8.9 09/13/2021   Lab Results  Component Value Date   TSH 2.030 06/05/2023   Lab Results  Component Value Date   CHOL 174 06/05/2023   HDL 52 06/05/2023   LDLCALC 98 06/05/2023   TRIG 134 06/05/2023   CHOLHDL 3.3 06/05/2023   Lab Results  Component Value Date   VD25OH 64.3 06/05/2023   VD25OH 59.5 01/04/2023   VD25OH 64.6 09/21/2022   Lab Results  Component Value Date   WBC 8.5 10/10/2020   HGB 14.8 10/10/2020   HCT 44.8 10/10/2020   MCV  84.2 10/10/2020   PLT 274 10/10/2020   No results found for: "IRON", "TIBC", "FERRITIN"  Attestation Statements:   Reviewed by clinician on day of visit: allergies, medications, problem list, medical history, surgical history, family history, social history, and previous encounter notes.  I have reviewed the above documentation for accuracy and completeness, and I agree with the above. -  Delmas Faucett d. Yeilin Zweber, NP-C

## 2023-06-26 NOTE — Telephone Encounter (Signed)
Reginal Lutes has been approved from 06/08/2023 until further notice.  Authorization # [id number].

## 2023-06-28 ENCOUNTER — Encounter (INDEPENDENT_AMBULATORY_CARE_PROVIDER_SITE_OTHER): Payer: Self-pay | Admitting: Adult Health

## 2023-06-29 ENCOUNTER — Other Ambulatory Visit (HOSPITAL_BASED_OUTPATIENT_CLINIC_OR_DEPARTMENT_OTHER): Payer: Self-pay

## 2023-06-29 MED ORDER — WEGOVY 0.25 MG/0.5ML ~~LOC~~ SOAJ
0.5000 mL | SUBCUTANEOUS | 0 refills | Status: DC
  Filled 2023-06-29: qty 2, 28d supply, fill #0

## 2023-07-31 DIAGNOSIS — N3001 Acute cystitis with hematuria: Secondary | ICD-10-CM | POA: Diagnosis not present

## 2023-07-31 DIAGNOSIS — R300 Dysuria: Secondary | ICD-10-CM | POA: Diagnosis not present

## 2023-07-31 DIAGNOSIS — R399 Unspecified symptoms and signs involving the genitourinary system: Secondary | ICD-10-CM | POA: Diagnosis not present

## 2023-08-01 ENCOUNTER — Ambulatory Visit (INDEPENDENT_AMBULATORY_CARE_PROVIDER_SITE_OTHER): Payer: Medicare Other | Admitting: Adult Health

## 2023-08-21 ENCOUNTER — Encounter (INDEPENDENT_AMBULATORY_CARE_PROVIDER_SITE_OTHER): Payer: Self-pay | Admitting: Adult Health

## 2023-08-21 ENCOUNTER — Ambulatory Visit (INDEPENDENT_AMBULATORY_CARE_PROVIDER_SITE_OTHER): Payer: Medicare Other | Admitting: Adult Health

## 2023-08-21 ENCOUNTER — Other Ambulatory Visit (HOSPITAL_BASED_OUTPATIENT_CLINIC_OR_DEPARTMENT_OTHER): Payer: Self-pay

## 2023-08-21 VITALS — BP 123/80 | HR 72 | Temp 97.5°F | Ht 62.0 in | Wt 151.0 lb

## 2023-08-21 DIAGNOSIS — Z6827 Body mass index (BMI) 27.0-27.9, adult: Secondary | ICD-10-CM | POA: Diagnosis not present

## 2023-08-21 DIAGNOSIS — N1831 Chronic kidney disease, stage 3a: Secondary | ICD-10-CM

## 2023-08-21 DIAGNOSIS — E559 Vitamin D deficiency, unspecified: Secondary | ICD-10-CM | POA: Diagnosis not present

## 2023-08-21 DIAGNOSIS — E669 Obesity, unspecified: Secondary | ICD-10-CM | POA: Diagnosis not present

## 2023-08-21 DIAGNOSIS — E782 Mixed hyperlipidemia: Secondary | ICD-10-CM

## 2023-08-21 MED ORDER — WEGOVY 0.25 MG/0.5ML ~~LOC~~ SOAJ
0.5000 mL | SUBCUTANEOUS | 0 refills | Status: DC
  Filled 2023-08-21: qty 2, 28d supply, fill #0

## 2023-08-21 NOTE — Progress Notes (Addendum)
 WEIGHT SUMMARY AND BIOMETRICS  Vitals Temp: (!) 97.5 F (36.4 C) BP: 123/80 Pulse Rate: 72 SpO2: 97 %   Anthropometric Measurements Height: 5\' 2"  (1.575 m) Weight: 151 lb (68.5 kg) BMI (Calculated): 27.61 Weight at Last Visit: 156lb Weight Lost Since Last Visit: 5lb Weight Gained Since Last Visit: 0 Starting Weight: 168lb Total Weight Loss (lbs): 17 lb (7.711 kg)   Body Composition  Body Fat %: 35.7 % Fat Mass (lbs): 54.2 lbs Muscle Mass (lbs): 92.6 lbs Total Body Water (lbs): 62 lbs Visceral Fat Rating : 10   Other Clinical Data Fasting: yes Labs: no Today's Visit #: 40 Starting Date: 02/19/23    Chief Complaint:   OBESITY Anita Randolph is here to discuss her progress with her obesity treatment plan. She is on the keeping a food journal and adhering to recommended goals of 1100 calories and 100 protein and states she is following her eating plan approximately 10 % of the time. She states she is exercising Brisk Walking 60 minutes 4 times per week.   Interim History:  07/28/2023- started on Wegovy 0.25mg  once weekly injection- 4th dose was on 08/18/2023. Denies mass in neck, dysphagia, dyspepsia, persistent hoarseness, abdominal pain, or constipation.  For 24-32 hours after injection, she will experience nausea without vomiting.  Her insurance would not cover Zepbound and she is paying out of pocket for [facility].  Current weight 151 lbs Interval goal to loss down to 145 lbs  Subjective:   1. Vitamin D deficiency  Latest Reference Range & Units 09/21/22 09:37 01/04/23 08:43 06/05/23 09:48  Vitamin D, 25-Hydroxy 30.0 - 100.0 ng/mL 64.6 59.5 64.3   She is on daily OTC Vit D 3 1000 international units daily She endorses stable energy levels   2. Mixed hyperlipidemia Lipid Panel     Component Value Date/Time   CHOL 174 06/05/2023 0948   TRIG 134 06/05/2023 0948   HDL 52 06/05/2023 0948   CHOLHDL 3.3 06/05/2023 0948   LDLCALC 98 06/05/2023 0948    LABVLDL 24 06/05/2023 0948   07/28/2023- she started on Wegovy 0.25mg  once weekly injection- 4th dose was on 08/18/2023. Denies mass in neck, dysphagia, dyspepsia, persistent hoarseness, abdominal pain, or constipation.  For 24-32 hours after injection, she will experience nausea without vomiting  She is also on daily Lipitor 40mg    3. Stage 3a chronic kidney disease (HCC) BP at goal at OV She is not any antihypertensive therapy  Assessment/Plan:   1. Vitamin D deficiency Continue OTC Vit D Supplementation Check labs at next OV  2. Mixed hyperlipidemia Continue daily Lipitor 40mg  Check labs at next OV  3. Stage 3a chronic kidney disease (HCC) Check labs at next OV  4. Obesity, current BMI 27.61 Refill Semaglutide-Weight Management (WEGOVY) 0.25 MG/0.5ML SOAJ Inject 0.25 mg into the skin once a week. Dispense: 2 mL, Refills: 0 of 0 remaining   She prefers to remain on loading dose for another month, due to concerns about medication SE  Anita Randolph is currently in the action stage of change. As such, her goal is to continue with weight loss efforts. She has agreed to keeping a food journal and adhering to recommended goals of 1100 calories and 100 protein.   Exercise goals: Older adults should follow the adult guidelines. When older adults cannot meet the adult guidelines, they should be as physically active as their abilities and conditions will allow.  Older adults should do exercises that maintain or improve balance if they are  at risk of falling.  Older adults should determine their level of effort for physical activity relative to their level of fitness.  Older adults with chronic conditions should understand whether and how their conditions affect their ability to do regular physical activity safely.  Behavioral modification strategies: increasing lean protein intake, decreasing simple carbohydrates, increasing vegetables, increasing water intake, decreasing eating out, no skipping  meals, meal planning and cooking strategies, better snacking choices, avoiding temptations, planning for success, and keeping a strict food journal.  Anita Randolph has agreed to follow-up with our clinic in 4 weeks. She was informed of the importance of frequent follow-up visits to maximize her success with intensive lifestyle modifications for her multiple health conditions.   Check Fasting Labs at next OV  Objective:   Blood pressure 123/80, pulse 72, temperature (!) 97.5 F (36.4 C), height 5\' 2"  (1.575 m), weight 151 lb (68.5 kg), last menstrual period 11/28/2004, SpO2 97%. Body mass index is 27.62 kg/m.  General: Cooperative, alert, well developed, in no acute distress. HEENT: Conjunctivae and lids unremarkable. Cardiovascular: Regular rhythm.  Lungs: Normal work of breathing. Neurologic: No focal deficits.   Lab Results  Component Value Date   CREATININE 1.05 (H) 01/04/2023   BUN 19 01/04/2023   NA 138 01/04/2023   K 4.2 01/04/2023   CL 99 01/04/2023   CO2 24 01/04/2023   Lab Results  Component Value Date   ALT 19 01/04/2023   AST 23 01/04/2023   ALKPHOS 73 01/04/2023   BILITOT 0.8 01/04/2023   Lab Results  Component Value Date   HGBA1C 5.8 (H) 06/05/2023   HGBA1C 5.9 (H) 01/04/2023   HGBA1C 5.8 (H) 09/21/2022   HGBA1C 5.7 (H) 04/13/2022   HGBA1C 5.7 (H) 12/21/2021   Lab Results  Component Value Date   INSULIN 10.4 06/05/2023   INSULIN 11.0 01/04/2023   INSULIN 11.0 09/21/2022   INSULIN 13.5 04/13/2022   INSULIN 8.9 09/13/2021   Lab Results  Component Value Date   TSH 2.030 06/05/2023   Lab Results  Component Value Date   CHOL 174 06/05/2023   HDL 52 06/05/2023   LDLCALC 98 06/05/2023   TRIG 134 06/05/2023   CHOLHDL 3.3 06/05/2023   Lab Results  Component Value Date   VD25OH 64.3 06/05/2023   VD25OH 59.5 01/04/2023   VD25OH 64.6 09/21/2022   Lab Results  Component Value Date   WBC 8.5 10/10/2020   HGB 14.8 10/10/2020   HCT 44.8 10/10/2020   MCV  84.2 10/10/2020   PLT 274 10/10/2020   No results found for: "IRON", "TIBC", "FERRITIN"  Attestation Statements:   Reviewed by clinician on day of visit: allergies, medications, problem list, medical history, surgical history, family history, social history, and previous encounter notes.  I have reviewed the above documentation for accuracy and completeness, and I agree with the above. -  Sindia Kowalczyk d. Arvis Zwahlen, NP-C

## 2023-08-23 DIAGNOSIS — Z0000 Encounter for general adult medical examination without abnormal findings: Secondary | ICD-10-CM | POA: Diagnosis not present

## 2023-08-23 DIAGNOSIS — Z23 Encounter for immunization: Secondary | ICD-10-CM | POA: Diagnosis not present

## 2023-09-10 DIAGNOSIS — Z23 Encounter for immunization: Secondary | ICD-10-CM | POA: Diagnosis not present

## 2023-09-11 DIAGNOSIS — K580 Irritable bowel syndrome with diarrhea: Secondary | ICD-10-CM | POA: Diagnosis not present

## 2023-09-12 DIAGNOSIS — Z01419 Encounter for gynecological examination (general) (routine) without abnormal findings: Secondary | ICD-10-CM | POA: Diagnosis not present

## 2023-09-12 DIAGNOSIS — M8580 Other specified disorders of bone density and structure, unspecified site: Secondary | ICD-10-CM | POA: Diagnosis not present

## 2023-09-12 DIAGNOSIS — Z1239 Encounter for other screening for malignant neoplasm of breast: Secondary | ICD-10-CM | POA: Diagnosis not present

## 2023-09-12 DIAGNOSIS — Z6828 Body mass index (BMI) 28.0-28.9, adult: Secondary | ICD-10-CM | POA: Diagnosis not present

## 2023-09-13 ENCOUNTER — Other Ambulatory Visit: Payer: Self-pay | Admitting: Family Medicine

## 2023-09-13 ENCOUNTER — Other Ambulatory Visit: Payer: Self-pay | Admitting: Obstetrics and Gynecology

## 2023-09-13 DIAGNOSIS — Z1239 Encounter for other screening for malignant neoplasm of breast: Secondary | ICD-10-CM

## 2023-09-13 DIAGNOSIS — Z1231 Encounter for screening mammogram for malignant neoplasm of breast: Secondary | ICD-10-CM

## 2023-09-18 ENCOUNTER — Ambulatory Visit
Admission: RE | Admit: 2023-09-18 | Discharge: 2023-09-18 | Disposition: A | Discharge status: Home / Self Care | Payer: Medicare Other | Source: Ambulatory Visit | Attending: Family Medicine | Admitting: Family Medicine

## 2023-09-18 DIAGNOSIS — Z1231 Encounter for screening mammogram for malignant neoplasm of breast: Secondary | ICD-10-CM | POA: Diagnosis not present

## 2023-09-20 ENCOUNTER — Other Ambulatory Visit: Payer: Self-pay

## 2023-09-20 ENCOUNTER — Encounter (INDEPENDENT_AMBULATORY_CARE_PROVIDER_SITE_OTHER): Payer: Self-pay | Admitting: Adult Health

## 2023-09-20 ENCOUNTER — Other Ambulatory Visit (HOSPITAL_BASED_OUTPATIENT_CLINIC_OR_DEPARTMENT_OTHER): Payer: Self-pay

## 2023-09-20 ENCOUNTER — Other Ambulatory Visit (INDEPENDENT_AMBULATORY_CARE_PROVIDER_SITE_OTHER): Payer: Self-pay | Admitting: Adult Health

## 2023-09-20 ENCOUNTER — Ambulatory Visit (INDEPENDENT_AMBULATORY_CARE_PROVIDER_SITE_OTHER): Payer: Medicare Other | Admitting: Adult Health

## 2023-09-20 VITALS — BP 119/78 | HR 74 | Temp 97.7°F | Ht 62.0 in | Wt 149.0 lb

## 2023-09-20 DIAGNOSIS — Z6827 Body mass index (BMI) 27.0-27.9, adult: Secondary | ICD-10-CM

## 2023-09-20 DIAGNOSIS — R030 Elevated blood-pressure reading, without diagnosis of hypertension: Secondary | ICD-10-CM | POA: Diagnosis not present

## 2023-09-20 DIAGNOSIS — R7303 Prediabetes: Secondary | ICD-10-CM

## 2023-09-20 DIAGNOSIS — E66811 Obesity, class 1: Secondary | ICD-10-CM

## 2023-09-20 DIAGNOSIS — E782 Mixed hyperlipidemia: Secondary | ICD-10-CM

## 2023-09-20 DIAGNOSIS — E559 Vitamin D deficiency, unspecified: Secondary | ICD-10-CM | POA: Diagnosis not present

## 2023-09-20 DIAGNOSIS — E669 Obesity, unspecified: Secondary | ICD-10-CM | POA: Diagnosis not present

## 2023-09-20 MED ORDER — SEMAGLUTIDE-WEIGHT MANAGEMENT 0.25 MG/0.5ML ~~LOC~~ SOAJ
0.2500 mg | SUBCUTANEOUS | 0 refills | Status: DC
  Filled 2023-09-20: qty 2, 28d supply, fill #0

## 2023-09-20 MED ORDER — SEMAGLUTIDE-WEIGHT MANAGEMENT 0.5 MG/0.5ML ~~LOC~~ SOAJ
0.5000 mg | SUBCUTANEOUS | 0 refills | Status: DC
  Filled 2023-09-20: qty 2, 28d supply, fill #0

## 2023-09-20 MED ORDER — ATORVASTATIN CALCIUM 40 MG PO TABS
40.0000 mg | ORAL_TABLET | Freq: Every day | ORAL | 0 refills | Status: DC
  Filled 2023-09-20: qty 90, 90d supply, fill #0

## 2023-09-20 MED ORDER — WEGOVY 0.5 MG/0.5ML ~~LOC~~ SOAJ
0.5000 mg | SUBCUTANEOUS | 0 refills | Status: DC
  Filled 2023-09-20 – 2023-10-17 (×3): qty 2, 28d supply, fill #0

## 2023-09-20 NOTE — Progress Notes (Addendum)
 WEIGHT SUMMARY AND BIOMETRICS  Vitals Temp: 97.7 F (36.5 C) BP: 119/78 Pulse Rate: 74 SpO2: 96 %   Anthropometric Measurements Height: 5\' 2"  (1.575 m) Weight: 149 lb (67.6 kg) BMI (Calculated): 27.25 Weight at Last Visit: 151lb Weight Lost Since Last Visit: 2lb Weight Gained Since Last Visit: 0 Starting Weight: 168lb Total Weight Loss (lbs): 19 lb (8.618 kg)   Body Composition  Body Fat %: 35.8 % Fat Mass (lbs): 53.4 lbs Muscle Mass (lbs): 91.2 lbs Total Body Water (lbs): 61.4 lbs Visceral Fat Rating : 10   Other Clinical Data Fasting: yes Labs: yes Today's Visit #: 34 Starting Date: 02/19/20    Chief Complaint:   OBESITY Anita Randolph is here to discuss her progress with her obesity treatment plan. She is on the keeping a food journal and adhering to recommended goals of 1100 calories and 100 protein and states she is following her eating plan approximately 70 % of the time. She states she is exercising Brisk Walking and Strength Training 60 minutes 5 times per week.   Interim History:  07/28/2023-she started on Wegovy 0.25mg  once weekly injection Due to GI upset (nausea without vomiting) she has remained on loading dose of 0.25mg  for another month.   She now reports being able to tolerate weekly GLP-1 injection.  She feel ready to increase to the lowest mx dose of 0.5mg  Wegovy  Denies mass in neck, dysphagia, dyspepsia, persistent hoarseness, abdominal pain, or N/V/C   Of note- She and her husband "Anita Randolph" have extensive travel planned on/off until Thanksgiving. Anita Randolph will turn 71 this fall!  Subjective:   1. Mixed hyperlipidemia 07/28/2023-She started on Wegovy 0.25mg  once weekly injection Due to GI upset (nausea without vomiting) she has remained on loading dose of 0.25mg  for another month.  She now reports being able to tolerate weekly GLP-1 injection.  She feel ready to increase to the lowest mx dose of 0.5mg  Wegovy  Denies mass in neck, dysphagia,  dyspepsia, persistent hoarseness, abdominal pain, or N/V/C   She is also on daily Lipitor 40mg    2. Prediabetes Lab Results  Component Value Date   HGBA1C 5.8 (H) 06/05/2023   HGBA1C 5.9 (H) 01/04/2023   HGBA1C 5.8 (H) 09/21/2022   She started on Wegovy 0.25mg  once weekly injection Due to GI upset (nausea without vomiting) she has remained on loading dose of 0.25mg  for another month.  3. Vit d Def  Latest Reference Range & Units 09/21/22 09:37 01/04/23 08:43 06/05/23 09:48  Vitamin D, 25-Hydroxy 30.0 - 100.0 ng/mL 64.6 59.5 64.3   She endorses stable energy levels. She is on daily Vit D 3 1,000 international units   4. Elevated BP reading without dx of HTN Her BP is excellent and at goal today! She denies CP with exertion  Assessment/Plan:   1. Mixed hyperlipidemia Check Labs Refill and increase   Semaglutide-Weight Management 0.5 MG/0.5ML SOAJ Inject 0.5 mg into the skin once a week. Dispense: 4 mL, Refills: 0 ordered   2. Prediabetes Check Labs  3. Vit d Def Check Labs  4. Elevated BP reading without dx of HTN Check Labs Continue to closely monitor readings  5. Obesity, current BMI 27.25 Refill and increase   Semaglutide-Weight Management 0.5 MG/0.5ML SOAJ Inject 0.5 mg into the skin once a week. Dispense: 4 mL, Refills: 0 ordered   Anita Randolph is currently in the action stage of change. As such, her goal is to continue with weight loss efforts. She has agreed to  keeping a food journal and adhering to recommended goals of 1100 calories and 100 protein.   Exercise goals: Older adults should follow the adult guidelines. When older adults cannot meet the adult guidelines, they should be as physically active as their abilities and conditions will allow.  Older adults should do exercises that maintain or improve balance if they are at risk of falling.  Older adults should determine their level of effort for physical activity relative to their level of fitness.  Older adults with  chronic conditions should understand whether and how their conditions affect their ability to do regular physical activity safely.  Behavioral modification strategies: increasing lean protein intake, decreasing simple carbohydrates, increasing vegetables, increasing water intake, decreasing eating out, no skipping meals, meal planning and cooking strategies, keeping healthy foods in the home, planning for success, and keeping a strict food journal.  Anita Randolph has agreed to follow-up with our clinic in 4 weeks. She was informed of the importance of frequent follow-up visits to maximize her success with intensive lifestyle modifications for her multiple health conditions.   Anita Randolph was informed we would discuss her lab results at her next visit unless there is a critical issue that needs to be addressed sooner. Anita Randolph agreed to keep her next visit at the agreed upon time to discuss these results.  Objective:   Blood pressure 119/78, pulse 74, temperature 97.7 F (36.5 C), height 5\' 2"  (1.575 m), weight 149 lb (67.6 kg), last menstrual period 11/28/2004, SpO2 96%. Body mass index is 27.25 kg/m.  General: Cooperative, alert, well developed, in no acute distress. HEENT: Conjunctivae and lids unremarkable. Cardiovascular: Regular rhythm.  Lungs: Normal work of breathing. Neurologic: No focal deficits.   Lab Results  Component Value Date   CREATININE 1.05 (H) 01/04/2023   BUN 19 01/04/2023   NA 138 01/04/2023   K 4.2 01/04/2023   CL 99 01/04/2023   CO2 24 01/04/2023   Lab Results  Component Value Date   ALT 19 01/04/2023   AST 23 01/04/2023   ALKPHOS 73 01/04/2023   BILITOT 0.8 01/04/2023   Lab Results  Component Value Date   HGBA1C 5.8 (H) 06/05/2023   HGBA1C 5.9 (H) 01/04/2023   HGBA1C 5.8 (H) 09/21/2022   HGBA1C 5.7 (H) 04/13/2022   HGBA1C 5.7 (H) 12/21/2021   Lab Results  Component Value Date   INSULIN 10.4 06/05/2023   INSULIN 11.0 01/04/2023   INSULIN 11.0 09/21/2022    INSULIN 13.5 04/13/2022   INSULIN 8.9 09/13/2021   Lab Results  Component Value Date   TSH 2.030 06/05/2023   Lab Results  Component Value Date   CHOL 174 06/05/2023   HDL 52 06/05/2023   LDLCALC 98 06/05/2023   TRIG 134 06/05/2023   CHOLHDL 3.3 06/05/2023   Lab Results  Component Value Date   VD25OH 64.3 06/05/2023   VD25OH 59.5 01/04/2023   VD25OH 64.6 09/21/2022   Lab Results  Component Value Date   WBC 8.5 10/10/2020   HGB 14.8 10/10/2020   HCT 44.8 10/10/2020   MCV 84.2 10/10/2020   PLT 274 10/10/2020   No results found for: "IRON", "TIBC", "FERRITIN"  Attestation Statements:   Reviewed by clinician on day of visit: allergies, medications, problem list, medical history, surgical history, family history, social history, and previous encounter notes.  I have reviewed the above documentation for accuracy and completeness, and I agree with the above. -  Lasean Rahming d. Daleyza Gadomski, NP-C

## 2023-09-21 ENCOUNTER — Other Ambulatory Visit: Payer: Self-pay | Admitting: Family Medicine

## 2023-09-21 ENCOUNTER — Other Ambulatory Visit (HOSPITAL_BASED_OUTPATIENT_CLINIC_OR_DEPARTMENT_OTHER): Payer: Self-pay

## 2023-09-21 ENCOUNTER — Other Ambulatory Visit: Payer: Self-pay | Admitting: Obstetrics and Gynecology

## 2023-09-21 DIAGNOSIS — R928 Other abnormal and inconclusive findings on diagnostic imaging of breast: Secondary | ICD-10-CM

## 2023-09-21 LAB — VITAMIN D 25 HYDROXY (VIT D DEFICIENCY, FRACTURES): Vit D, 25-Hydroxy: 76 ng/mL (ref 30.0–100.0)

## 2023-09-21 LAB — LIPID PANEL
Chol/HDL Ratio: 3.4 ratio (ref 0.0–4.4)
Cholesterol, Total: 175 mg/dL (ref 100–199)
HDL: 52 mg/dL
LDL Chol Calc (NIH): 104 mg/dL — ABNORMAL HIGH (ref 0–99)
Triglycerides: 106 mg/dL (ref 0–149)
VLDL Cholesterol Cal: 19 mg/dL (ref 5–40)

## 2023-09-21 LAB — COMPREHENSIVE METABOLIC PANEL WITH GFR
ALT: 14 IU/L (ref 0–32)
AST: 21 IU/L (ref 0–40)
Albumin: 4.3 g/dL (ref 3.8–4.8)
Alkaline Phosphatase: 77 IU/L (ref 44–121)
BUN/Creatinine Ratio: 14 (ref 12–28)
BUN: 15 mg/dL (ref 8–27)
Bilirubin Total: 0.8 mg/dL (ref 0.0–1.2)
CO2: 26 mmol/L (ref 20–29)
Calcium: 9.6 mg/dL (ref 8.7–10.3)
Chloride: 104 mmol/L (ref 96–106)
Creatinine, Ser: 1.06 mg/dL — ABNORMAL HIGH (ref 0.57–1.00)
Globulin, Total: 2.6 g/dL (ref 1.5–4.5)
Glucose: 90 mg/dL (ref 70–99)
Potassium: 4.6 mmol/L (ref 3.5–5.2)
Sodium: 142 mmol/L (ref 134–144)
Total Protein: 6.9 g/dL (ref 6.0–8.5)
eGFR: 56 mL/min/1.73 — ABNORMAL LOW

## 2023-09-21 LAB — HEMOGLOBIN A1C
Est. average glucose Bld gHb Est-mCnc: 120 mg/dL
Hgb A1c MFr Bld: 5.8 % — ABNORMAL HIGH (ref 4.8–5.6)

## 2023-10-06 ENCOUNTER — Ambulatory Visit
Admission: RE | Admit: 2023-10-06 | Discharge: 2023-10-06 | Disposition: A | Discharge status: Home / Self Care | Payer: Medicare Other | Source: Ambulatory Visit | Attending: Family Medicine | Admitting: Family Medicine

## 2023-10-06 DIAGNOSIS — R928 Other abnormal and inconclusive findings on diagnostic imaging of breast: Secondary | ICD-10-CM

## 2023-10-06 DIAGNOSIS — Z853 Personal history of malignant neoplasm of breast: Secondary | ICD-10-CM | POA: Diagnosis not present

## 2023-10-10 DIAGNOSIS — L814 Other melanin hyperpigmentation: Secondary | ICD-10-CM | POA: Diagnosis not present

## 2023-10-10 DIAGNOSIS — L820 Inflamed seborrheic keratosis: Secondary | ICD-10-CM | POA: Diagnosis not present

## 2023-10-10 DIAGNOSIS — L821 Other seborrheic keratosis: Secondary | ICD-10-CM | POA: Diagnosis not present

## 2023-10-11 DIAGNOSIS — K580 Irritable bowel syndrome with diarrhea: Secondary | ICD-10-CM | POA: Diagnosis not present

## 2023-10-11 DIAGNOSIS — K5790 Diverticulosis of intestine, part unspecified, without perforation or abscess without bleeding: Secondary | ICD-10-CM | POA: Diagnosis not present

## 2023-10-11 DIAGNOSIS — Z860101 Personal history of adenomatous and serrated colon polyps: Secondary | ICD-10-CM | POA: Diagnosis not present

## 2023-10-17 ENCOUNTER — Other Ambulatory Visit (HOSPITAL_BASED_OUTPATIENT_CLINIC_OR_DEPARTMENT_OTHER): Payer: Self-pay

## 2023-10-19 ENCOUNTER — Ambulatory Visit (INDEPENDENT_AMBULATORY_CARE_PROVIDER_SITE_OTHER): Payer: Medicare Other | Admitting: Adult Health

## 2023-10-19 ENCOUNTER — Other Ambulatory Visit (HOSPITAL_BASED_OUTPATIENT_CLINIC_OR_DEPARTMENT_OTHER): Payer: Self-pay

## 2023-10-19 ENCOUNTER — Other Ambulatory Visit: Payer: Self-pay

## 2023-10-19 ENCOUNTER — Encounter (INDEPENDENT_AMBULATORY_CARE_PROVIDER_SITE_OTHER): Payer: Self-pay | Admitting: Adult Health

## 2023-10-19 VITALS — BP 111/77 | HR 84 | Temp 97.8°F | Ht 62.0 in | Wt 148.0 lb

## 2023-10-19 DIAGNOSIS — N1831 Chronic kidney disease, stage 3a: Secondary | ICD-10-CM

## 2023-10-19 DIAGNOSIS — R7303 Prediabetes: Secondary | ICD-10-CM | POA: Diagnosis not present

## 2023-10-19 DIAGNOSIS — Z6827 Body mass index (BMI) 27.0-27.9, adult: Secondary | ICD-10-CM

## 2023-10-19 DIAGNOSIS — R030 Elevated blood-pressure reading, without diagnosis of hypertension: Secondary | ICD-10-CM | POA: Diagnosis not present

## 2023-10-19 DIAGNOSIS — E782 Mixed hyperlipidemia: Secondary | ICD-10-CM

## 2023-10-19 DIAGNOSIS — E559 Vitamin D deficiency, unspecified: Secondary | ICD-10-CM | POA: Diagnosis not present

## 2023-10-19 DIAGNOSIS — E66811 Obesity, class 1: Secondary | ICD-10-CM

## 2023-10-19 DIAGNOSIS — E669 Obesity, unspecified: Secondary | ICD-10-CM | POA: Diagnosis not present

## 2023-10-19 MED ORDER — WEGOVY 0.5 MG/0.5ML ~~LOC~~ SOAJ
0.5000 mg | SUBCUTANEOUS | 0 refills | Status: DC
  Filled 2023-10-19: qty 2, 28d supply, fill #0
  Filled 2023-11-11: qty 2, 28d supply, fill #1

## 2023-10-19 NOTE — Progress Notes (Signed)
WEIGHT SUMMARY AND BIOMETRICS  No data recorded No data recorded No data recorded No data recorded  Chief Complaint:   OBESITY Anita Randolph is here to discuss her progress with her obesity treatment plan. She is on the keeping a food journal and adhering to recommended goals of 1100 calories and 100 protein and states she is following her eating plan approximately 60 % of the time. She states she is exercising Walking/Gym Exercise 60 minutes 4 times per week.   Interim History:  07/28/2023 Started on loading dose Wegovy 0.25mg  She has been on this strength > 10 weeks Denies mass in neck, dysphagia, dyspepsia, persistent hoarseness, abdominal pain, or N/V/C   06/26/2023 weight 156 lbs 10/19/2023 weight 148 lbs She has lost 5% body weight  She is agreeable to increase to lowest mx dose of Wegovy 0.5mg  this week.  She and her husband just returned from 6 day cruise- she is thrilled to have lost 1 lb  Subjective:   1. Mixed hyperlipidemia Discussed Labs Lipid Panel     Component Value Date/Time   CHOL 175 09/20/2023 1006   TRIG 106 09/20/2023 1006   HDL 52 09/20/2023 1006   CHOLHDL 3.4 09/20/2023 1006   LDLCALC 104 (H) 09/20/2023 1006   LABVLDL 19 09/20/2023 1006   ASCVD Risk Start 7.9%  Goal 7.7%  Rec Moderate Strength Statin therapy- she is on daily Lipitor 40mg - denies myaglais  2. Elevated blood-pressure reading without diagnosis of hypertension Discussed Labs 09/20/2023 CMP- Electrolytes, liver, and kidney enzymes stable. Kidney fx IMPROVED  BP at goal at OV She is NOT on any antihypertensive therapy  3. Prediabetes Discussed Labs Lab Results  Component Value Date   HGBA1C 5.8 (H) 09/20/2023   HGBA1C 5.8 (H) 06/05/2023   HGBA1C 5.9 (H) 01/04/2023    Stable She is on weekly Wegovy 0.25mg - will increase to lowest mx dose of 0.5mg  this week  4. Stage 3a chronic kidney disease (HCC) Discussed Labs  Latest Reference Range & Units 09/20/23 10:06   Creatinine 0.57 - 1.00 mg/dL 4.01 (H)  Calcium 8.7 - 10.3 mg/dL 9.6  BUN/Creatinine Ratio 12 - 28  14  eGFR >59 mL/min/1.73 56 (L)  (H): Data is abnormally high (L): Data is abnormally low  Stable She is NOT on ACE/ARB threrapy  5. Vitamin D deficiency Discussed Labs  Latest Reference Range & Units 09/20/23 10:06  Vitamin D, 25-Hydroxy 30.0 - 100.0 ng/mL 76.0   She is on Calcium Carbonate-Vitamin D 600-400 MG-UNIT tablet [facility] D3 25 MCG (1000 UT) capsule  Assessment/Plan:   1. Mixed hyperlipidemia Continue statin therapy and increase regular exercise  2. Elevated blood-pressure reading without diagnosis of hypertension Continue healthy eating, regular exercise, and monitor levels  3. Prediabetes Continue healthy eating, regular exercise, and weekly Wegovy therapy  4. Stage 3a chronic kidney disease (HCC) Continue to avoid Nephrotoxic substances Keep BP at goal Monitor labs  5. Vitamin D deficiency Continue  Calcium Carbonate-Vitamin D 600-400 MG-UNIT tablet [employer] D3 25 MCG (1000 UT) capsule  6. Obesity, current BMI 27.25 Refill and increase Semaglutide-Weight Management (WEGOVY) 0.5 MG/0.5ML SOAJ Inject 0.5 mg into the skin once a week. Dispense: 6 mL, Refills: 0 of 0 remaining   Anita Randolph is currently in the action stage of change. As such, her goal is to continue with weight loss efforts. She has agreed to keeping a food journal and adhering to recommended goals of 1100 calories and 100 protein.   Exercise goals: Older adults should  follow the adult guidelines. When older adults cannot meet the adult guidelines, they should be as physically active as their abilities and conditions will allow.  Older adults should do exercises that maintain or improve balance if they are at risk of falling.  Older adults should determine their level of effort for physical activity relative to their level of fitness.  Older adults with chronic conditions should understand whether and how their  conditions affect their ability to do regular physical activity safely.  Behavioral modification strategies: increasing lean protein intake, decreasing simple carbohydrates, increasing vegetables, increasing water intake, meal planning and cooking strategies, keeping healthy foods in the home, holiday eating strategies , celebration eating strategies, and planning for success.  Anita Randolph has agreed to follow-up with our clinic in 6 weeks. She was informed of the importance of frequent follow-up visits to maximize her success with intensive lifestyle modifications for her multiple health conditions.   Objective:   Last menstrual period 11/28/2004. There is no height or weight on file to calculate BMI.  General: Cooperative, alert, well developed, in no acute distress. HEENT: Conjunctivae and lids unremarkable. Cardiovascular: Regular rhythm.  Lungs: Normal work of breathing. Neurologic: No focal deficits.   Lab Results  Component Value Date   CREATININE 1.06 (H) 09/20/2023   BUN 15 09/20/2023   NA 142 09/20/2023   K 4.6 09/20/2023   CL 104 09/20/2023   CO2 26 09/20/2023   Lab Results  Component Value Date   ALT 14 09/20/2023   AST 21 09/20/2023   ALKPHOS 77 09/20/2023   BILITOT 0.8 09/20/2023   Lab Results  Component Value Date   HGBA1C 5.8 (H) 09/20/2023   HGBA1C 5.8 (H) 06/05/2023   HGBA1C 5.9 (H) 01/04/2023   HGBA1C 5.8 (H) 09/21/2022   HGBA1C 5.7 (H) 04/13/2022   Lab Results  Component Value Date   INSULIN 10.4 06/05/2023   INSULIN 11.0 01/04/2023   INSULIN 11.0 09/21/2022   INSULIN 13.5 04/13/2022   INSULIN 8.9 09/13/2021   Lab Results  Component Value Date   TSH 2.030 06/05/2023   Lab Results  Component Value Date   CHOL 175 09/20/2023   HDL 52 09/20/2023   LDLCALC 104 (H) 09/20/2023   TRIG 106 09/20/2023   CHOLHDL 3.4 09/20/2023   Lab Results  Component Value Date   VD25OH 76.0 09/20/2023   VD25OH 64.3 06/05/2023   VD25OH 59.5 01/04/2023   Lab  Results  Component Value Date   WBC 8.5 10/10/2020   HGB 14.8 10/10/2020   HCT 44.8 10/10/2020   MCV 84.2 10/10/2020   PLT 274 10/10/2020   No results found for: "IRON", "TIBC", "FERRITIN"   Attestation Statements:   Reviewed by clinician on day of visit: allergies, medications, problem list, medical history, surgical history, family history, social history, and previous encounter notes.  I have reviewed the above documentation for accuracy and completeness, and I agree with the above. -  Derrick Orris d. Zophia Marrone, NP-C

## 2023-10-20 ENCOUNTER — Other Ambulatory Visit (HOSPITAL_BASED_OUTPATIENT_CLINIC_OR_DEPARTMENT_OTHER): Payer: Self-pay

## 2023-11-13 ENCOUNTER — Other Ambulatory Visit (HOSPITAL_BASED_OUTPATIENT_CLINIC_OR_DEPARTMENT_OTHER): Payer: Self-pay

## 2023-11-21 ENCOUNTER — Encounter (INDEPENDENT_AMBULATORY_CARE_PROVIDER_SITE_OTHER): Payer: Self-pay | Admitting: Adult Health

## 2023-11-21 ENCOUNTER — Other Ambulatory Visit (HOSPITAL_BASED_OUTPATIENT_CLINIC_OR_DEPARTMENT_OTHER): Payer: Self-pay

## 2023-11-21 ENCOUNTER — Ambulatory Visit (INDEPENDENT_AMBULATORY_CARE_PROVIDER_SITE_OTHER): Payer: Medicare Other | Admitting: Adult Health

## 2023-11-21 VITALS — BP 115/75 | HR 84 | Temp 97.9°F | Ht 62.0 in | Wt 145.0 lb

## 2023-11-21 DIAGNOSIS — E669 Obesity, unspecified: Secondary | ICD-10-CM | POA: Diagnosis not present

## 2023-11-21 DIAGNOSIS — Z6830 Body mass index (BMI) 30.0-30.9, adult: Secondary | ICD-10-CM

## 2023-11-21 DIAGNOSIS — Z6826 Body mass index (BMI) 26.0-26.9, adult: Secondary | ICD-10-CM | POA: Diagnosis not present

## 2023-11-21 DIAGNOSIS — N1831 Chronic kidney disease, stage 3a: Secondary | ICD-10-CM | POA: Diagnosis not present

## 2023-11-21 DIAGNOSIS — Z0000 Encounter for general adult medical examination without abnormal findings: Secondary | ICD-10-CM

## 2023-11-21 MED ORDER — WEGOVY 0.5 MG/0.5ML ~~LOC~~ SOAJ
0.5000 mg | SUBCUTANEOUS | 0 refills | Status: DC
  Filled 2023-11-21 – 2023-12-05 (×2): qty 6, 84d supply, fill #0
  Filled 2023-12-06: qty 2, 28d supply, fill #0
  Filled 2023-12-06: qty 6, 84d supply, fill #0
  Filled 2023-12-06: qty 2, 28d supply, fill #0

## 2023-11-21 NOTE — Progress Notes (Addendum)
 WEIGHT SUMMARY AND BIOMETRICS  Vitals Temp: 97.9 F (36.6 C) BP: 115/75 Pulse Rate: 84 SpO2: 97 %   Anthropometric Measurements Height: 5\' 2"  (1.575 m) Weight: 145 lb (65.8 kg) BMI (Calculated): 26.51 Weight at Last Visit: 148 lb Weight Lost Since Last Visit: 3 lb Weight Gained Since Last Visit: 0 Starting Weight: 168 lb Total Weight Loss (lbs): 23 lb (10.4 kg)   Body Composition  Body Fat %: 36.2 % Fat Mass (lbs): 52.8 lbs Muscle Mass (lbs): 88 lbs Total Body Water (lbs): 61.8 lbs Visceral Fat Rating : 10   Other Clinical Data Fasting: No Labs: No Today's Visit #: 62 Starting Date: 02/19/23    Chief Complaint:   OBESITY Anita Randolph is here to discuss her progress with her obesity treatment plan. She is on the keeping a food journal and adhering to recommended goals of 1100 calories and 100 protein and states she is following her eating plan approximately 60 % of the time. She states she is exercising Walking 60 minutes 4 times per week.   Interim History:  Current weight 145 lbs with corresponding BMI 26.6 She has achieved first weight loss goal of reaching 145 lbs!  Next goal is to loss down between 135-140 lbs  She and her family have been traveling and celebrating the last 6 weeks- Holidays "[name]" her husband turned 34 yesterday.  She recently increased Wegovy from 0.25mg  to 0.5mg - tolerating increase well  Subjective:   1. Stage 3a chronic kidney disease (HCC)  Latest Reference Range & Units 09/21/22 09:37 01/04/23 08:43 09/20/23 10:06  Creatinine 0.57 - 1.00 mg/dL 7.16 (H) 9.67 (H) 8.93 (H)  (H): Data is abnormally high  Latest Reference Range & Units 09/21/22 09:37 01/04/23 08:43 09/20/23 10:06  eGFR >59 mL/min/1.73 51 (L) 57 (L) 56 (L)  (L): Data is abnormally low  CKD stable She is not on an ACE or ARB  2. Healthcare maintenance 07/28/2023 Started on loading dose Wegovy 0.25mg   10/19/2023 Increased Wegovy from 0.25mg  to 0.5mg  Denies  mass in neck, dysphagia, dyspepsia, persistent hoarseness, abdominal pain, or N/C She will intermittently experience morning nausea without vomiting.  Assessment/Plan:   1. Stage 3a chronic kidney disease (HCC) (Primary) Remain well hydrated with water Continue to avoid Nephrotoxic substances  2. Healthcare maintenance Continue healthy eating, regular exercise, and low dose wegovy injections  3. Obesity, current BMI 26.6 Refill Semaglutide-Weight Management (WEGOVY) 0.5 MG/0.5ML SOAJ Inject 0.5 mg into the skin once a week. Dispense: 6 mL, Refills: 0 of 0 remaining   Harini is currently in the action stage of change. As such, her goal is to continue with weight loss efforts. She has agreed to keeping a food journal and adhering to recommended goals of 1100 calories and 100 protein.   Exercise goals: Older adults should follow the adult guidelines. When older adults cannot meet the adult guidelines, they should be as physically active as their abilities and conditions will allow.  Older adults should do exercises that maintain or improve balance if they are at risk of falling.  Older adults should determine their level of effort for physical activity relative to their level of fitness.  Older adults with chronic conditions should understand whether and how their conditions affect their ability to do regular physical activity safely.  Behavioral modification strategies: increasing lean protein intake, decreasing simple carbohydrates, increasing vegetables, increasing water intake, meal planning and cooking strategies, keeping healthy foods in the home, ways to avoid boredom eating, holiday eating  strategies , celebration eating strategies, and planning for success.  Raffaela has agreed to follow-up with our clinic in 6 weeks. She was informed of the importance of frequent follow-up visits to maximize her success with intensive lifestyle modifications for her multiple health conditions.   Check  Fasting Labs at next OV  Objective:   Blood pressure 115/75, pulse 84, temperature 97.9 F (36.6 C), height 5\' 2"  (1.575 m), weight 145 lb (65.8 kg), last menstrual period 11/28/2004, SpO2 97%. Body mass index is 26.52 kg/m.  General: Cooperative, alert, well developed, in no acute distress. HEENT: Conjunctivae and lids unremarkable. Cardiovascular: Regular rhythm.  Lungs: Normal work of breathing. Neurologic: No focal deficits.   Lab Results  Component Value Date   CREATININE 1.06 (H) 09/20/2023   BUN 15 09/20/2023   NA 142 09/20/2023   K 4.6 09/20/2023   CL 104 09/20/2023   CO2 26 09/20/2023   Lab Results  Component Value Date   ALT 14 09/20/2023   AST 21 09/20/2023   ALKPHOS 77 09/20/2023   BILITOT 0.8 09/20/2023   Lab Results  Component Value Date   HGBA1C 5.8 (H) 09/20/2023   HGBA1C 5.8 (H) 06/05/2023   HGBA1C 5.9 (H) 01/04/2023   HGBA1C 5.8 (H) 09/21/2022   HGBA1C 5.7 (H) 04/13/2022   Lab Results  Component Value Date   INSULIN 10.4 06/05/2023   INSULIN 11.0 01/04/2023   INSULIN 11.0 09/21/2022   INSULIN 13.5 04/13/2022   INSULIN 8.9 09/13/2021   Lab Results  Component Value Date   TSH 2.030 06/05/2023   Lab Results  Component Value Date   CHOL 175 09/20/2023   HDL 52 09/20/2023   LDLCALC 104 (H) 09/20/2023   TRIG 106 09/20/2023   CHOLHDL 3.4 09/20/2023   Lab Results  Component Value Date   VD25OH 76.0 09/20/2023   VD25OH 64.3 06/05/2023   VD25OH 59.5 01/04/2023   Lab Results  Component Value Date   WBC 8.5 10/10/2020   HGB 14.8 10/10/2020   HCT 44.8 10/10/2020   MCV 84.2 10/10/2020   PLT 274 10/10/2020   No results found for: "IRON", "TIBC", "FERRITIN"  Attestation Statements:   Reviewed by clinician on day of visit: allergies, medications, problem list, medical history, surgical history, family history, social history, and previous encounter notes.  I have reviewed the above documentation for accuracy and completeness, and I agree  with the above. -  Dhruti Ghuman d. Sharnita Bogucki, NP-C

## 2023-11-24 DIAGNOSIS — R300 Dysuria: Secondary | ICD-10-CM | POA: Diagnosis not present

## 2023-12-05 ENCOUNTER — Other Ambulatory Visit (HOSPITAL_BASED_OUTPATIENT_CLINIC_OR_DEPARTMENT_OTHER): Payer: Self-pay

## 2023-12-06 ENCOUNTER — Other Ambulatory Visit (HOSPITAL_BASED_OUTPATIENT_CLINIC_OR_DEPARTMENT_OTHER): Payer: Self-pay

## 2024-01-02 ENCOUNTER — Ambulatory Visit (INDEPENDENT_AMBULATORY_CARE_PROVIDER_SITE_OTHER): Payer: Medicare Other | Admitting: Adult Health

## 2024-01-17 ENCOUNTER — Ambulatory Visit (INDEPENDENT_AMBULATORY_CARE_PROVIDER_SITE_OTHER): Payer: Medicare Other | Admitting: Adult Health

## 2024-01-17 ENCOUNTER — Encounter (INDEPENDENT_AMBULATORY_CARE_PROVIDER_SITE_OTHER): Payer: Self-pay | Admitting: Adult Health

## 2024-01-17 ENCOUNTER — Other Ambulatory Visit (HOSPITAL_BASED_OUTPATIENT_CLINIC_OR_DEPARTMENT_OTHER): Payer: Self-pay

## 2024-01-17 VITALS — BP 113/75 | HR 71 | Temp 97.7°F | Ht 62.0 in | Wt 142.0 lb

## 2024-01-17 DIAGNOSIS — E6689 Other obesity not elsewhere classified: Secondary | ICD-10-CM | POA: Diagnosis not present

## 2024-01-17 DIAGNOSIS — E538 Deficiency of other specified B group vitamins: Secondary | ICD-10-CM

## 2024-01-17 DIAGNOSIS — N1831 Chronic kidney disease, stage 3a: Secondary | ICD-10-CM

## 2024-01-17 DIAGNOSIS — E559 Vitamin D deficiency, unspecified: Secondary | ICD-10-CM

## 2024-01-17 DIAGNOSIS — Z6825 Body mass index (BMI) 25.0-25.9, adult: Secondary | ICD-10-CM

## 2024-01-17 DIAGNOSIS — R030 Elevated blood-pressure reading, without diagnosis of hypertension: Secondary | ICD-10-CM

## 2024-01-17 DIAGNOSIS — R7303 Prediabetes: Secondary | ICD-10-CM | POA: Diagnosis not present

## 2024-01-17 DIAGNOSIS — E782 Mixed hyperlipidemia: Secondary | ICD-10-CM | POA: Diagnosis not present

## 2024-01-17 DIAGNOSIS — E66811 Obesity, class 1: Secondary | ICD-10-CM

## 2024-01-17 MED ORDER — ATORVASTATIN CALCIUM 40 MG PO TABS
40.0000 mg | ORAL_TABLET | Freq: Every day | ORAL | 0 refills | Status: DC
  Filled 2024-01-17: qty 90, 90d supply, fill #0

## 2024-01-17 NOTE — Progress Notes (Signed)
 WEIGHT SUMMARY AND BIOMETRICS  Vitals Temp: 97.7 F (36.5 C) BP: 113/75 Pulse Rate: 71 SpO2: 98 %   Anthropometric Measurements Height: 5\' 2"  (1.575 m) Weight: 142 lb (64.4 kg) BMI (Calculated): 25.97 Weight at Last Visit: 145lb Weight Lost Since Last Visit: 3lb Weight Gained Since Last Visit: 0 Starting Weight: 168lb Total Weight Loss (lbs): 26 lb (11.8 kg)   Body Composition  Body Fat %: 35.4 % Fat Mass (lbs): 50.2 lbs Muscle Mass (lbs): 87.2 lbs Total Body Water (lbs): 61.2 lbs Visceral Fat Rating : 9   Other Clinical Data Fasting: yes Labs: yes Today's Visit #: 80 Starting Date: 02/19/23    Chief Complaint:   OBESITY Anita Randolph is here to discuss her progress with her obesity treatment plan.  She is on the keeping a food journal and adhering to recommended goals of 1100 calories and 100g+ protein and states she is following her eating plan approximately 70 % of the time.  She states she is exercising Walking and Strength Training 60 minutes 4 times per week.   Interim History:  07/28/2023 Started Wegovy 0.25mg    08/21/23 09:00  Height 5\' 2"  (1.575 m)  Weight 151 lb (68.5 kg)  BMI (Calculated) 27.61   Increased Wegovy from 0.25mg  to 0.5mg  on 10/12/2023   01/17/24 10:00  Height 5\' 2"  (1.575 m)  Weight 142 lb (64.4 kg)  BMI (Calculated) 25.97      Hunger/appetite-she endorses stable appetite.  Stress- she denies acute stress/anxiety.  Exercise-walking and strength training.  She would like to focus on strengthening her core and reducing abdominal adiposity.  Reviewed Bioimpedance results with pt: Muscle Mass:- 0.8 lb Adipose Mass:-2.6 lbs  Subjective:   1. Stage 3a chronic kidney disease (HCC) BP at goal without ACE or ARB therapy  2. Mixed hyperlipidemia Lipid Panel     Component Value Date/Time   CHOL 175 09/20/2023 1006   TRIG 106 09/20/2023 1006   HDL 52 09/20/2023 1006   CHOLHDL 3.4 09/20/2023 1006   LDLCALC 104 (H)  09/20/2023 1006   LABVLDL 19 09/20/2023 1006    She is on daily  3. Elevated blood-pressure reading without diagnosis of hypertension BP at goal without ACE or ARB therapy  4. Prediabetes 07/28/2023 Started ZOXWRU 0.25mg    08/21/23 09:00  Height 5\' 2"  (1.575 m)  Weight 151 lb (68.5 kg)  BMI (Calculated) 27.61   Increased Wegovy from 0.25mg  to 0.5mg  on 10/12/2023   01/17/24 10:00  Height 5\' 2"  (1.575 m)  Weight 142 lb (64.4 kg)  BMI (Calculated) 25.97      Denies mass in neck, dysphagia, dyspepsia, persistent hoarseness, abdominal pain, or N/V/C   5. Vitamin D deficiency She is on daily OTC Vit D 3 1,000 international units   6. B12 nutritional deficiency She is not currently on any oral B12 oral supplementation   Assessment/Plan:   1. Stage 3a chronic kidney disease (HCC) (Primary) Check Labs - Comprehensive metabolic panel  2. Mixed hyperlipidemia Check Labs - Lipid panel  3. Elevated blood-pressure reading without diagnosis of hypertension Monitor BP Limit Na+ Remain well hydrated with water  4. Prediabetes Check Labs - Hemoglobin A1c - Insulin, random  5. Vitamin D deficiency Check Labs - VITAMIN D 25 Hydroxy (Vit-D Deficiency, Fractures)  6. B12 nutritional deficiency Check Labs - Vitamin B12  7. Obesity, current BMI 25.97 Continue weekly Wegovy 0.5mg - she has 8 week supply at home.   Of note- it is more cost effective to  order 3 month supply   Akeisha is currently in the action stage of change. As such, her goal is to continue with weight loss efforts. She has agreed to keeping a food journal and adhering to recommended goals of 1100 calories and 100g+ protein.   Exercise goals: Older adults should follow the adult guidelines. When older adults cannot meet the adult guidelines, they should be as physically active as their abilities and conditions will allow.  Older adults should do exercises that maintain or improve balance if they are at risk of  falling.  Older adults should determine their level of effort for physical activity relative to their level of fitness.  Older adults with chronic conditions should understand whether and how their conditions affect their ability to do regular physical activity safely. Ad in Core Strengthening at least once per week.  Behavioral modification strategies: increasing lean protein intake, decreasing simple carbohydrates, increasing vegetables, increasing water intake, no skipping meals, meal planning and cooking strategies, keeping healthy foods in the home, ways to avoid boredom eating, and planning for success.  Jacquette has agreed to follow-up with our clinic in 4 weeks. She was informed of the importance of frequent follow-up visits to maximize her success with intensive lifestyle modifications for her multiple health conditions.   Lorenzo was informed we would discuss her lab results at her next visit unless there is a critical issue that needs to be addressed sooner. Tiffini agreed to keep her next visit at the agreed upon time to discuss these results.  Objective:   Blood pressure 113/75, pulse 71, temperature 97.7 F (36.5 C), height 5\' 2"  (1.575 m), weight 142 lb (64.4 kg), last menstrual period 11/28/2004, SpO2 98%. Body mass index is 25.97 kg/m.  General: Cooperative, alert, well developed, in no acute distress. HEENT: Conjunctivae and lids unremarkable. Cardiovascular: Regular rhythm.  Lungs: Normal work of breathing. Neurologic: No focal deficits.   Lab Results  Component Value Date   CREATININE 1.06 (H) 09/20/2023   BUN 15 09/20/2023   NA 142 09/20/2023   K 4.6 09/20/2023   CL 104 09/20/2023   CO2 26 09/20/2023   Lab Results  Component Value Date   ALT 14 09/20/2023   AST 21 09/20/2023   ALKPHOS 77 09/20/2023   BILITOT 0.8 09/20/2023   Lab Results  Component Value Date   HGBA1C 5.8 (H) 09/20/2023   HGBA1C 5.8 (H) 06/05/2023   HGBA1C 5.9 (H) 01/04/2023   HGBA1C 5.8 (H)  09/21/2022   HGBA1C 5.7 (H) 04/13/2022   Lab Results  Component Value Date   INSULIN 10.4 06/05/2023   INSULIN 11.0 01/04/2023   INSULIN 11.0 09/21/2022   INSULIN 13.5 04/13/2022   INSULIN 8.9 09/13/2021   Lab Results  Component Value Date   TSH 2.030 06/05/2023   Lab Results  Component Value Date   CHOL 175 09/20/2023   HDL 52 09/20/2023   LDLCALC 104 (H) 09/20/2023   TRIG 106 09/20/2023   CHOLHDL 3.4 09/20/2023   Lab Results  Component Value Date   VD25OH 76.0 09/20/2023   VD25OH 64.3 06/05/2023   VD25OH 59.5 01/04/2023   Lab Results  Component Value Date   WBC 8.5 10/10/2020   HGB 14.8 10/10/2020   HCT 44.8 10/10/2020   MCV 84.2 10/10/2020   PLT 274 10/10/2020   No results found for: "IRON", "TIBC", "FERRITIN"  Attestation Statements:   Reviewed by clinician on day of visit: allergies, medications, problem list, medical history, surgical history, family history, social  history, and previous encounter notes.  I have reviewed the above documentation for accuracy and completeness, and I agree with the above. -  Tobey Schmelzle d. Manvir Prabhu, NP-C

## 2024-01-18 LAB — COMPREHENSIVE METABOLIC PANEL
ALT: 16 [IU]/L (ref 0–32)
AST: 25 [IU]/L (ref 0–40)
Albumin: 4.5 g/dL (ref 3.8–4.8)
Alkaline Phosphatase: 77 [IU]/L (ref 44–121)
BUN/Creatinine Ratio: 15 (ref 12–28)
BUN: 16 mg/dL (ref 8–27)
Bilirubin Total: 0.8 mg/dL (ref 0.0–1.2)
CO2: 22 mmol/L (ref 20–29)
Calcium: 9.6 mg/dL (ref 8.7–10.3)
Chloride: 103 mmol/L (ref 96–106)
Creatinine, Ser: 1.07 mg/dL — ABNORMAL HIGH (ref 0.57–1.00)
Globulin, Total: 2.8 g/dL (ref 1.5–4.5)
Glucose: 83 mg/dL (ref 70–99)
Potassium: 4.5 mmol/L (ref 3.5–5.2)
Sodium: 141 mmol/L (ref 134–144)
Total Protein: 7.3 g/dL (ref 6.0–8.5)
eGFR: 56 mL/min/{1.73_m2} — ABNORMAL LOW (ref >59)

## 2024-01-18 LAB — INSULIN, RANDOM: INSULIN: 7.1 u[IU]/mL (ref 2.6–24.9)

## 2024-01-18 LAB — LIPID PANEL
Chol/HDL Ratio: 2.9 {ratio} (ref 0.0–4.4)
Cholesterol, Total: 164 mg/dL (ref 100–199)
HDL: 56 mg/dL (ref >39)
LDL Chol Calc (NIH): 92 mg/dL (ref 0–99)
Triglycerides: 84 mg/dL (ref 0–149)
VLDL Cholesterol Cal: 16 mg/dL (ref 5–40)

## 2024-01-18 LAB — VITAMIN B12: Vitamin B-12: 462 pg/mL (ref 232–1245)

## 2024-01-18 LAB — HEMOGLOBIN A1C
Est. average glucose Bld gHb Est-mCnc: 108 mg/dL
Hgb A1c MFr Bld: 5.4 % (ref 4.8–5.6)

## 2024-01-18 LAB — VITAMIN D 25 HYDROXY (VIT D DEFICIENCY, FRACTURES): Vit D, 25-Hydroxy: 83.3 ng/mL (ref 30.0–100.0)

## 2024-01-19 DIAGNOSIS — H6122 Impacted cerumen, left ear: Secondary | ICD-10-CM | POA: Diagnosis not present

## 2024-01-19 DIAGNOSIS — Z822 Family history of deafness and hearing loss: Secondary | ICD-10-CM | POA: Diagnosis not present

## 2024-01-19 DIAGNOSIS — H903 Sensorineural hearing loss, bilateral: Secondary | ICD-10-CM | POA: Diagnosis not present

## 2024-01-29 DIAGNOSIS — J101 Influenza due to other identified influenza virus with other respiratory manifestations: Secondary | ICD-10-CM | POA: Diagnosis not present

## 2024-01-29 DIAGNOSIS — R509 Fever, unspecified: Secondary | ICD-10-CM | POA: Diagnosis not present

## 2024-02-06 DIAGNOSIS — Z961 Presence of intraocular lens: Secondary | ICD-10-CM | POA: Diagnosis not present

## 2024-02-06 DIAGNOSIS — H2512 Age-related nuclear cataract, left eye: Secondary | ICD-10-CM | POA: Diagnosis not present

## 2024-02-06 DIAGNOSIS — H5213 Myopia, bilateral: Secondary | ICD-10-CM | POA: Diagnosis not present

## 2024-02-28 ENCOUNTER — Encounter (INDEPENDENT_AMBULATORY_CARE_PROVIDER_SITE_OTHER): Payer: Self-pay | Admitting: Adult Health

## 2024-02-28 ENCOUNTER — Other Ambulatory Visit (HOSPITAL_BASED_OUTPATIENT_CLINIC_OR_DEPARTMENT_OTHER): Payer: Self-pay

## 2024-02-28 ENCOUNTER — Ambulatory Visit (INDEPENDENT_AMBULATORY_CARE_PROVIDER_SITE_OTHER): Payer: Medicare Other | Admitting: Adult Health

## 2024-02-28 VITALS — BP 121/80 | HR 73 | Temp 97.4°F | Ht 62.0 in | Wt 139.0 lb

## 2024-02-28 DIAGNOSIS — E559 Vitamin D deficiency, unspecified: Secondary | ICD-10-CM

## 2024-02-28 DIAGNOSIS — Z6825 Body mass index (BMI) 25.0-25.9, adult: Secondary | ICD-10-CM

## 2024-02-28 DIAGNOSIS — E782 Mixed hyperlipidemia: Secondary | ICD-10-CM

## 2024-02-28 DIAGNOSIS — E66811 Obesity, class 1: Secondary | ICD-10-CM

## 2024-02-28 DIAGNOSIS — R030 Elevated blood-pressure reading, without diagnosis of hypertension: Secondary | ICD-10-CM | POA: Diagnosis not present

## 2024-02-28 DIAGNOSIS — R7303 Prediabetes: Secondary | ICD-10-CM | POA: Diagnosis not present

## 2024-02-28 DIAGNOSIS — E669 Obesity, unspecified: Secondary | ICD-10-CM

## 2024-02-28 MED ORDER — WEGOVY 0.5 MG/0.5ML ~~LOC~~ SOAJ
0.5000 mg | SUBCUTANEOUS | 0 refills | Status: DC
  Filled 2024-02-28: qty 6, 84d supply, fill #0

## 2024-02-28 MED ORDER — ATORVASTATIN CALCIUM 40 MG PO TABS
40.0000 mg | ORAL_TABLET | Freq: Every day | ORAL | 0 refills | Status: DC
  Filled 2024-02-28 – 2024-03-25 (×2): qty 90, 90d supply, fill #0

## 2024-02-28 NOTE — Progress Notes (Signed)
 WEIGHT SUMMARY AND BIOMETRICS  Vitals Temp: (!) 97.4 F (36.3 C) BP: 121/80 Pulse Rate: 73 SpO2: 100 %   Anthropometric Measurements Height: 5\' 2"  (1.575 m) Weight: 139 lb (63 kg) BMI (Calculated): 25.42 Weight at Last Visit: 142 lb Weight Lost Since Last Visit: 3 lb Weight Gained Since Last Visit: 0 Starting Weight: 168 lb Total Weight Loss (lbs): 29 lb (13.2 kg)   Body Composition  Body Fat %: 34.4 % Fat Mass (lbs): 48 lbs Muscle Mass (lbs): 87 lbs Total Body Water (lbs): 60 lbs Visceral Fat Rating : 9   Other Clinical Data Fasting: yes Labs: no Today's Visit #: 45 Starting Date: 02/19/20    Chief Complaint:   OBESITY Anita Randolph is here to discuss her progress with her obesity treatment plan.  She is on the keeping a food journal and adhering to recommended goals of 1100 calories and 10g+ protein and states she is following her eating plan approximately 50 % of the time.  She states she is exercising walking 60 minutes 4 times per week- in the last week.   Interim History:  Anita Randolph was Flu + early March She reports lingering cough and fatigue for 3-4 weeks She just resumed weekly walking this week.  She and her husband will enjoy a River Cruise in May 2025  01/30/2020 Starting Weight 168 lbs with corresponding BMI 29.77 02/28/2024 Current weight 139 lbs with corresponding BMI 25.42 She has lost 29 lbs and reduced BMI by 4.35 points  Goal weight 135-140 lbs  Reviewed Bioimpedance results with pt: Muscle Mass: +0.2 lb Adipose Mass: -2.2 lbs  Of note- 07/28/2023 Started on loading dose Wegovy 0.25mg   10/19/2023 Increased Wegovy from 0.25mg  to 0.5mg   Subjective:   1. Prediabetes Discussed Labs  Latest Reference Range & Units 01/17/24 11:18  Glucose 70 - 99 mg/dL 83  Hemoglobin D2K 4.8 - 5.6 % 5.4  Est. average glucose Bld gHb Est-mCnc mg/dL 025  INSULIN 2.6 - 42.7 uIU/mL 7.1   CBG and A1c both at goal! Insulin Level stable She is on  weekly low dose Wegovy 0.5mg  Denies mass in neck, dysphagia, dyspepsia, persistent hoarseness, abdominal pain, or N/V/C   2. Vitamin D deficiency Discussed Labs  Latest Reference Range & Units 01/17/24 11:18  Vitamin D, 25-Hydroxy 30.0 - 100.0 ng/mL 83.3  Vitamin B12 232 - 1,245 pg/mL 462   Vit D Level at goal B12 Level stable She is on daily Ca++/Vit D supplement and daily OTC Vit D3 1000 international units   3. Elevated blood-pressure reading without diagnosis of hypertension Discussed Labs BP excellent and at goal at OV 01/17/2024 CMP: Electrolytes, Liver enzmes- normal CKD stable  4. Mixed hyperlipidemia Discussed Labs Lipid Panel     Component Value Date/Time   CHOL 164 01/17/2024 1118   TRIG 84 01/17/2024 1118   HDL 56 01/17/2024 1118   CHOLHDL 2.9 01/17/2024 1118   LDLCALC 92 01/17/2024 1118   LABVLDL 16 01/17/2024 1118    BEST LIPID PANEL in years! She is on daily Lipitor 40mg - denies myalgias  Assessment/Plan:   1. Prediabetes (Primary) Continue healthy eating and regular exercise Continue low dose weekly Wegovy 0.5mg   2. Vitamin D deficiency Continue OTC Supplementation-  daily Ca++/Vit D supplement and daily OTC Vit D3 1000 international units   3. Elevated blood-pressure reading without diagnosis of hypertension Continue healthy eating and regular exercise  4. Mixed hyperlipidemia Refill  atorvastatin (LIPITOR) 40 MG tablet Take 1 tablet (40 mg  total) by mouth daily. Dispense: 90 tablet, Refills: 0 ordered   5. Obesity, current BMI 25.5 Refill  Semaglutide-Weight Management (WEGOVY) 0.5 MG/0.5ML SOAJ Inject 0.5 mg into the skin once a week. Dispense: 6 mL, Refills: 0 of 0 remaining   Anita Randolph is currently in the action stage of change. As such, her goal is to maintain weight for now. She has agreed to keeping a food journal and adhering to recommended goals of 1100 calories and 100g+ protein.   Exercise goals: Older adults should follow the adult  guidelines. When older adults cannot meet the adult guidelines, they should be as physically active as their abilities and conditions will allow.  Older adults should do exercises that maintain or improve balance if they are at risk of falling.  Older adults should determine their level of effort for physical activity relative to their level of fitness.  Older adults with chronic conditions should understand whether and how their conditions affect their ability to do regular physical activity safely.  Behavioral modification strategies: increasing lean protein intake, decreasing simple carbohydrates, increasing vegetables, increasing water intake, no skipping meals, meal planning and cooking strategies, keeping healthy foods in the home, travel eating strategies, and planning for success.  Anita Randolph has agreed to follow-up with our clinic in 8 weeks. She was informed of the importance of frequent follow-up visits to maximize her success with intensive lifestyle modifications for her multiple health conditions.   Objective:   Blood pressure 121/80, pulse 73, temperature (!) 97.4 F (36.3 C), height 5\' 2"  (1.575 m), weight 139 lb (63 kg), last menstrual period 11/28/2004, SpO2 100%. Body mass index is 25.42 kg/m.  General: Cooperative, alert, well developed, in no acute distress. HEENT: Conjunctivae and lids unremarkable. Cardiovascular: Regular rhythm.  Lungs: Normal work of breathing. Neurologic: No focal deficits.   Lab Results  Component Value Date   CREATININE 1.07 (H) 01/17/2024   BUN 16 01/17/2024   NA 141 01/17/2024   K 4.5 01/17/2024   CL 103 01/17/2024   CO2 22 01/17/2024   Lab Results  Component Value Date   ALT 16 01/17/2024   AST 25 01/17/2024   ALKPHOS 77 01/17/2024   BILITOT 0.8 01/17/2024   Lab Results  Component Value Date   HGBA1C 5.4 01/17/2024   HGBA1C 5.8 (H) 09/20/2023   HGBA1C 5.8 (H) 06/05/2023   HGBA1C 5.9 (H) 01/04/2023   HGBA1C 5.8 (H) 09/21/2022   Lab  Results  Component Value Date   INSULIN 7.1 01/17/2024   INSULIN 10.4 06/05/2023   INSULIN 11.0 01/04/2023   INSULIN 11.0 09/21/2022   INSULIN 13.5 04/13/2022   Lab Results  Component Value Date   TSH 2.030 06/05/2023   Lab Results  Component Value Date   CHOL 164 01/17/2024   HDL 56 01/17/2024   LDLCALC 92 01/17/2024   TRIG 84 01/17/2024   CHOLHDL 2.9 01/17/2024   Lab Results  Component Value Date   VD25OH 83.3 01/17/2024   VD25OH 76.0 09/20/2023   VD25OH 64.3 06/05/2023   Lab Results  Component Value Date   WBC 8.5 10/10/2020   HGB 14.8 10/10/2020   HCT 44.8 10/10/2020   MCV 84.2 10/10/2020   PLT 274 10/10/2020   No results found for: "IRON", "TIBC", "FERRITIN"  Attestation Statements:   Reviewed by clinician on day of visit: allergies, medications, problem list, medical history, surgical history, family history, social history, and previous encounter notes.  I have reviewed the above documentation for accuracy and completeness, and  I agree with the above. -  Aleyza Salmi d. Nakiya Rallis, NP-C

## 2024-03-01 ENCOUNTER — Other Ambulatory Visit (HOSPITAL_BASED_OUTPATIENT_CLINIC_OR_DEPARTMENT_OTHER): Payer: Self-pay

## 2024-03-19 ENCOUNTER — Other Ambulatory Visit: Payer: BLUE CROSS/BLUE SHIELD

## 2024-03-25 ENCOUNTER — Other Ambulatory Visit (HOSPITAL_BASED_OUTPATIENT_CLINIC_OR_DEPARTMENT_OTHER): Payer: Self-pay

## 2024-03-25 ENCOUNTER — Encounter (INDEPENDENT_AMBULATORY_CARE_PROVIDER_SITE_OTHER): Payer: Self-pay | Admitting: Adult Health

## 2024-03-25 ENCOUNTER — Ambulatory Visit
Admission: RE | Admit: 2024-03-25 | Discharge: 2024-03-25 | Disposition: A | Discharge status: Home / Self Care | Source: Ambulatory Visit | Attending: Obstetrics and Gynecology | Admitting: Obstetrics and Gynecology

## 2024-03-25 DIAGNOSIS — Z1239 Encounter for other screening for malignant neoplasm of breast: Secondary | ICD-10-CM | POA: Diagnosis not present

## 2024-03-25 DIAGNOSIS — H9201 Otalgia, right ear: Secondary | ICD-10-CM | POA: Diagnosis not present

## 2024-03-25 DIAGNOSIS — Z853 Personal history of malignant neoplasm of breast: Secondary | ICD-10-CM | POA: Diagnosis not present

## 2024-03-25 DIAGNOSIS — H6591 Unspecified nonsuppurative otitis media, right ear: Secondary | ICD-10-CM | POA: Diagnosis not present

## 2024-03-25 MED ORDER — GADOPICLENOL 0.5 MMOL/ML IV SOLN
7.0000 mL | Freq: Once | INTRAVENOUS | Status: AC | PRN
  Administered 2024-03-25: 7 mL via INTRAVENOUS

## 2024-03-26 ENCOUNTER — Encounter (INDEPENDENT_AMBULATORY_CARE_PROVIDER_SITE_OTHER): Payer: Self-pay | Admitting: Adult Health

## 2024-04-10 DIAGNOSIS — H9201 Otalgia, right ear: Secondary | ICD-10-CM | POA: Diagnosis not present

## 2024-05-06 ENCOUNTER — Encounter (INDEPENDENT_AMBULATORY_CARE_PROVIDER_SITE_OTHER): Payer: Self-pay | Admitting: Adult Health

## 2024-05-06 ENCOUNTER — Ambulatory Visit (INDEPENDENT_AMBULATORY_CARE_PROVIDER_SITE_OTHER): Admitting: Adult Health

## 2024-05-06 ENCOUNTER — Other Ambulatory Visit (HOSPITAL_BASED_OUTPATIENT_CLINIC_OR_DEPARTMENT_OTHER): Payer: Self-pay

## 2024-05-06 VITALS — BP 94/54 | HR 76 | Temp 98.0°F | Ht 62.0 in | Wt 134.0 lb

## 2024-05-06 DIAGNOSIS — R7303 Prediabetes: Secondary | ICD-10-CM

## 2024-05-06 DIAGNOSIS — Z6830 Body mass index (BMI) 30.0-30.9, adult: Secondary | ICD-10-CM

## 2024-05-06 DIAGNOSIS — E538 Deficiency of other specified B group vitamins: Secondary | ICD-10-CM | POA: Diagnosis not present

## 2024-05-06 DIAGNOSIS — N1831 Chronic kidney disease, stage 3a: Secondary | ICD-10-CM

## 2024-05-06 DIAGNOSIS — Z6824 Body mass index (BMI) 24.0-24.9, adult: Secondary | ICD-10-CM

## 2024-05-06 DIAGNOSIS — E669 Obesity, unspecified: Secondary | ICD-10-CM

## 2024-05-06 DIAGNOSIS — E66811 Obesity, class 1: Secondary | ICD-10-CM

## 2024-05-06 DIAGNOSIS — E782 Mixed hyperlipidemia: Secondary | ICD-10-CM

## 2024-05-06 MED ORDER — WEGOVY 0.5 MG/0.5ML ~~LOC~~ SOAJ
0.5000 mg | SUBCUTANEOUS | 0 refills | Status: DC
  Filled 2024-05-06 – 2024-05-22 (×2): qty 6, 84d supply, fill #0

## 2024-05-06 NOTE — Progress Notes (Signed)
 WEIGHT SUMMARY AND BIOMETRICS  Vitals Temp: 98 F (36.7 C) BP: (!) 94/54 Pulse Rate: 76 SpO2: 98 %   Anthropometric Measurements Height: 5\' 2"  (1.575 m) Weight: 134 lb (60.8 kg) BMI (Calculated): 24.5 Weight at Last Visit: 139 lb Weight Lost Since Last Visit: 5 lb Weight Gained Since Last Visit: 0 Starting Weight: 168 lb Total Weight Loss (lbs): 34 lb (15.4 kg)   Body Composition  Body Fat %: 32.4 % Fat Mass (lbs): 43.6 lbs Muscle Mass (lbs): 86.6 lbs Total Body Water  (lbs): 57.8 lbs Visceral Fat Rating : 9   Other Clinical Data Fasting: yes Labs: no Today's Visit #: 77 Starting Date: 02/19/20    Chief Complaint:   OBESITY Anita Randolph is here to discuss her progress with her obesity treatment plan.  She is on the keeping a food journal and adhering to recommended goals of 1100 calories and 100g+ protein and states she is following her eating plan approximately 20 % of the time.  She states she is exercising Walking 60 minutes 4-5 times per week.   Interim History:  Started Wegovy  0.25mg  on 07/28/2023 10/19/2023 increased Wegovy  from 0.25mg  to 0.5mg  She started spacing out injections 7-10 days since end of April 2025  Reviewed Bioempedence results with pt: Muscle Mass: -0.4 lbs Adipose Mass: -4.4 lbs  She was COVID-19 + last week She denies acute respiratory or GI sx's at present Home test yesterday - She is in N95 mask today- very thoughtful, thank you!  Current weight 134 lbs with corresponding BMI 24.7 SHE IS AT GOAL WEIGHT  Agreeable to begin Mx Phase  Subjective:   1. Stage 3a chronic kidney disease (HCC) BP soft yet stable at OV She denies sx's of hypotension She is not on anti-hypertensive therapy  2. Mixed hyperlipidemia Lipid Panel     Component Value Date/Time   CHOL 164 01/17/2024 1118   TRIG 84 01/17/2024 1118   HDL 56 01/17/2024 1118   CHOLHDL 2.9 01/17/2024 1118   LDLCALC 92 01/17/2024 1118   LABVLDL 16 01/17/2024 1118     Started Wegovy  0.25mg  on 07/28/2023 10/19/2023 increased Wegovy  from 0.25mg  to 0.5mg  She started spacing out injections 7-10 days since end of April 2025 She is also on Lipitor 40mg  daily- denies myalgias  3. Prediabetes Lab Results  Component Value Date   HGBA1C 5.4 01/17/2024   HGBA1C 5.8 (H) 09/20/2023   HGBA1C 5.8 (H) 06/05/2023     Latest Reference Range & Units 01/04/23 08:43 06/05/23 09:48 01/17/24 11:18  INSULIN  2.6 - 24.9 uIU/mL 11.0 10.4 7.1   Started Wegovy  0.25mg  on 07/28/2023 10/19/2023 increased Wegovy  from 0.25mg  to 0.5mg  She started spacing out injections 7-10 days since end of April 2025  4. B12 nutritional deficiency  Latest Reference Range & Units 05/26/21 13:30 04/13/22 09:14 01/17/24 11:18  Vitamin B12 232 - 1,245 pg/mL 660 520 462   She endorses stable energy levels  Assessment/Plan:   1. Stage 3a chronic kidney disease (HCC) (Primary) Increase water  intake Add some shakes of Na+ onto food Closely monitor for sx's of hypotension- if any develop, contact either PCP or [facility]  2. Mixed hyperlipidemia Continue healthy eating, regular exercise, and current Rx regime  3. Prediabetes Continue healthy eating, regular exercise, and current Rx regime  4. B12 nutritional deficiency Continue healthy eating, regular exercise, and current Rx regime  5. Obesity, current BMI 24.7 Refill Semaglutide -Weight Management (WEGOVY ) 0.5 MG/0.5ML SOAJ Inject 0.5 mg into the skin once a week. Dispense:  6 mL, Refills: 0 of 0 remaining   Continue to administer Q10D  Anita Randolph is currently in the action stage of change. As such, her goal is to maintain weight for now. She has agreed to practicing portion control and making smarter food choices, such as increasing vegetables and decreasing simple carbohydrates.   Exercise goals: Older adults should follow the adult guidelines. When older adults cannot meet the adult guidelines, they should be as physically active as their abilities  and conditions will allow.  Older adults should do exercises that maintain or improve balance if they are at risk of falling.  Older adults should determine their level of effort for physical activity relative to their level of fitness.  Older adults with chronic conditions should understand whether and how their conditions affect their ability to do regular physical activity safely.  Behavioral modification strategies: increasing lean protein intake, decreasing simple carbohydrates, increasing vegetables, increasing water  intake, no skipping meals, meal planning and cooking strategies, keeping healthy foods in the home, and planning for success.  Anita Randolph has agreed to follow-up with our clinic in 6 weeks. She was informed of the importance of frequent follow-up visits to maximize her success with intensive lifestyle modifications for her multiple health conditions.   Check Fasting Labs and IC at next OV- pt aware to arrive 30 mins early and to be fasting.   BEGIN MX PHASE  Objective:   Blood pressure (!) 94/54, pulse 76, temperature 98 F (36.7 C), height 5\' 2"  (1.575 m), weight 134 lb (60.8 kg), last menstrual period 11/28/2004, SpO2 98%. Body mass index is 24.51 kg/m.  General: Cooperative, alert, well developed, in no acute distress. HEENT: Conjunctivae and lids unremarkable. Cardiovascular: Regular rhythm.  Lungs: Normal work of breathing. Neurologic: No focal deficits.   Lab Results  Component Value Date   CREATININE 1.07 (H) 01/17/2024   BUN 16 01/17/2024   NA 141 01/17/2024   K 4.5 01/17/2024   CL 103 01/17/2024   CO2 22 01/17/2024   Lab Results  Component Value Date   ALT 16 01/17/2024   AST 25 01/17/2024   ALKPHOS 77 01/17/2024   BILITOT 0.8 01/17/2024   Lab Results  Component Value Date   HGBA1C 5.4 01/17/2024   HGBA1C 5.8 (H) 09/20/2023   HGBA1C 5.8 (H) 06/05/2023   HGBA1C 5.9 (H) 01/04/2023   HGBA1C 5.8 (H) 09/21/2022   Lab Results  Component Value Date    INSULIN  7.1 01/17/2024   INSULIN  10.4 06/05/2023   INSULIN  11.0 01/04/2023   INSULIN  11.0 09/21/2022   INSULIN  13.5 04/13/2022   Lab Results  Component Value Date   TSH 2.030 06/05/2023   Lab Results  Component Value Date   CHOL 164 01/17/2024   HDL 56 01/17/2024   LDLCALC 92 01/17/2024   TRIG 84 01/17/2024   CHOLHDL 2.9 01/17/2024   Lab Results  Component Value Date   VD25OH 83.3 01/17/2024   VD25OH 76.0 09/20/2023   VD25OH 64.3 06/05/2023   Lab Results  Component Value Date   WBC 8.5 10/10/2020   HGB 14.8 10/10/2020   HCT 44.8 10/10/2020   MCV 84.2 10/10/2020   PLT 274 10/10/2020   No results found for: "IRON", "TIBC", "FERRITIN"  Attestation Statements:   Reviewed by clinician on day of visit: allergies, medications, problem list, medical history, surgical history, family history, social history, and previous encounter notes.  I have reviewed the above documentation for accuracy and completeness, and I agree with the above. -  Afton Mikelson d. Soleia Badolato, NP-C

## 2024-05-22 ENCOUNTER — Other Ambulatory Visit (HOSPITAL_BASED_OUTPATIENT_CLINIC_OR_DEPARTMENT_OTHER): Payer: Self-pay

## 2024-06-13 ENCOUNTER — Encounter (INDEPENDENT_AMBULATORY_CARE_PROVIDER_SITE_OTHER): Payer: Medicare Other | Admitting: Ophthalmology

## 2024-06-19 ENCOUNTER — Ambulatory Visit (INDEPENDENT_AMBULATORY_CARE_PROVIDER_SITE_OTHER): Admitting: Adult Health

## 2024-06-19 ENCOUNTER — Encounter (INDEPENDENT_AMBULATORY_CARE_PROVIDER_SITE_OTHER): Payer: Self-pay | Admitting: Adult Health

## 2024-06-19 ENCOUNTER — Other Ambulatory Visit (HOSPITAL_BASED_OUTPATIENT_CLINIC_OR_DEPARTMENT_OTHER): Payer: Self-pay

## 2024-06-19 VITALS — BP 134/82 | HR 70 | Temp 97.8°F | Ht 62.0 in | Wt 135.0 lb

## 2024-06-19 DIAGNOSIS — Z6824 Body mass index (BMI) 24.0-24.9, adult: Secondary | ICD-10-CM | POA: Diagnosis not present

## 2024-06-19 DIAGNOSIS — E782 Mixed hyperlipidemia: Secondary | ICD-10-CM | POA: Diagnosis not present

## 2024-06-19 DIAGNOSIS — E66811 Obesity, class 1: Secondary | ICD-10-CM

## 2024-06-19 DIAGNOSIS — R7303 Prediabetes: Secondary | ICD-10-CM | POA: Diagnosis not present

## 2024-06-19 DIAGNOSIS — E538 Deficiency of other specified B group vitamins: Secondary | ICD-10-CM

## 2024-06-19 DIAGNOSIS — R0602 Shortness of breath: Secondary | ICD-10-CM | POA: Diagnosis not present

## 2024-06-19 DIAGNOSIS — E559 Vitamin D deficiency, unspecified: Secondary | ICD-10-CM | POA: Diagnosis not present

## 2024-06-19 DIAGNOSIS — N1831 Chronic kidney disease, stage 3a: Secondary | ICD-10-CM

## 2024-06-19 DIAGNOSIS — E669 Obesity, unspecified: Secondary | ICD-10-CM

## 2024-06-19 MED ORDER — WEGOVY 0.5 MG/0.5ML ~~LOC~~ SOAJ
0.5000 mg | SUBCUTANEOUS | 2 refills | Status: AC
  Filled 2024-06-19 – 2024-07-30 (×2): qty 6, 84d supply, fill #0
  Filled 2024-11-03: qty 6, 84d supply, fill #1

## 2024-06-19 NOTE — Progress Notes (Signed)
 WEIGHT SUMMARY AND BIOMETRICS  Vitals Temp: 97.8 F (36.6 C) BP: 134/82 Pulse Rate: 70 SpO2: 97 %   Anthropometric Measurements Height: 5' 2 (1.575 m) Weight: 135 lb (61.2 kg) BMI (Calculated): 24.69 Weight at Last Visit: 139 lb Weight Lost Since Last Visit: 0 Weight Gained Since Last Visit: 1 lb Starting Weight: 168 lb Total Weight Loss (lbs): 33 lb (15 kg)   Body Composition  Body Fat %: 33.4 % Fat Mass (lbs): 45.4 lbs Muscle Mass (lbs): 85.8 lbs Total Body Water  (lbs): 59.4 lbs Visceral Fat Rating : 9   Other Clinical Data RMR: 1354 Fasting: yes Labs: yes Today's Visit #: 80 Starting Date: 02/19/20    Chief Complaint:   OBESITY Anita Randolph is here to discuss her progress with her obesity treatment plan.  She is on the keeping a food journal and adhering to recommended goals of 1100 calories and 100g+ protein and states she is following her eating plan approximately 50 % of the time.  She states she is exercising Gym/Walking 60 minutes 2/3 times per week.  Interim History:   STARTED MX PHASE 05/06/2024  Started Wegovy  0.25mg  on 07/28/2023 10/19/2023 increased Wegovy  from 0.25mg  to 0.5mg  She started spacing out injections 7-10 days since end of April 2025  Denies mass in neck, dysphagia, dyspepsia, persistent hoarseness, abdominal pain, or N/V/C    06/19/24 08:00  Weight 135 lb (61.2 kg)  BMI (Calculated) 24.69  Total Weight Loss (lbs) 33 lb (15 kg)     Subjective:   1. SOB (shortness of breath) [R06.02] She endorses dyspnea with extreme exertion, denies CP   06/19/24 08:00  RMR 1354   Metabolism increased and faster than anticipated.  2. Stage 3a chronic kidney disease (HCC) BP stable at OV She is on wegovy  0.5mg  Q10 days Denies mass in neck, dysphagia, dyspepsia, persistent hoarseness, abdominal pain, or N/V/C   3. B12 nutritional deficiency  Latest Reference Range & Units 01/17/24 11:18  Vitamin B12 232 - 1,245 pg/mL 462   She  endorses stable energy levels  4. Prediabetes Lab Results  Component Value Date   HGBA1C 5.4 01/17/2024   HGBA1C 5.8 (H) 09/20/2023   HGBA1C 5.8 (H) 06/05/2023     5. Mixed hyperlipidemia She is on Lipitor 40mg  She denies myalgias  6. Vitamin D  deficiency She is on daily Calcium  Carb with Vit D and daily Vit D3 1000 international units   Assessment/Plan:   1. SOB (shortness of breath) [R06.02] (Primary) To maintain weight: 1350 cal with at least 100g protein/day Strive fro 20-30 g protein per meal  2. Stage 3a chronic kidney disease (HCC) Check Labs - Comprehensive metabolic panel with GFR  3. B12 nutritional deficiency Check Labs - Vitamin B12  4. Prediabetes Check Labs - Hemoglobin A1c - Insulin , random  5. Mixed hyperlipidemia Check Labs - Lipid panel  6. Vitamin D  deficiency Check Labs - VITAMIN D  25 Hydroxy (Vit-D Deficiency, Fractures)  7. Obesity, current BMI 24.8 CONTINUE MX PHASE  Anita Randolph is currently in the action stage of change. As such, her goal is to maintain weight for now. She has agreed to keeping a food journal and adhering to recommended goals of 1350 calories and 100g+ protein.   Exercise goals: Older adults should follow the adult guidelines. When older adults cannot meet the adult guidelines, they should be as physically active as their abilities and conditions will allow.  Older adults should do exercises that maintain or improve balance if they are  at risk of falling.  Older adults should determine their level of effort for physical activity relative to their level of fitness.  Older adults with chronic conditions should understand whether and how their conditions affect their ability to do regular physical activity safely.  Behavioral modification strategies: increasing lean protein intake, decreasing simple carbohydrates, increasing vegetables, increasing water  intake, no skipping meals, meal planning and cooking strategies, keeping healthy  foods in the home, ways to avoid boredom eating, and planning for success.  Anita Randolph has agreed to follow-up with our clinic in 12 weeks. She was informed of the importance of frequent follow-up visits to maximize her success with intensive lifestyle modifications for her multiple health conditions.   Anita Randolph was informed we would discuss her lab results at her next visit unless there is a critical issue that needs to be addressed sooner. Anita Randolph agreed to keep her next visit at the agreed upon time to discuss these results.  Objective:   Blood pressure 134/82, pulse 70, temperature 97.8 F (36.6 C), height 5' 2 (1.575 m), weight 135 lb (61.2 kg), last menstrual period 11/28/2004, SpO2 97%. Body mass index is 24.69 kg/m.  General: Cooperative, alert, well developed, in no acute distress. HEENT: Conjunctivae and lids unremarkable. Cardiovascular: Regular rhythm.  Lungs: Normal work of breathing. Neurologic: No focal deficits.   Lab Results  Component Value Date   CREATININE 1.07 (H) 01/17/2024   BUN 16 01/17/2024   NA 141 01/17/2024   K 4.5 01/17/2024   CL 103 01/17/2024   CO2 22 01/17/2024   Lab Results  Component Value Date   ALT 16 01/17/2024   AST 25 01/17/2024   ALKPHOS 77 01/17/2024   BILITOT 0.8 01/17/2024   Lab Results  Component Value Date   HGBA1C 5.4 01/17/2024   HGBA1C 5.8 (H) 09/20/2023   HGBA1C 5.8 (H) 06/05/2023   HGBA1C 5.9 (H) 01/04/2023   HGBA1C 5.8 (H) 09/21/2022   Lab Results  Component Value Date   INSULIN  7.1 01/17/2024   INSULIN  10.4 06/05/2023   INSULIN  11.0 01/04/2023   INSULIN  11.0 09/21/2022   INSULIN  13.5 04/13/2022   Lab Results  Component Value Date   TSH 2.030 06/05/2023   Lab Results  Component Value Date   CHOL 164 01/17/2024   HDL 56 01/17/2024   LDLCALC 92 01/17/2024   TRIG 84 01/17/2024   CHOLHDL 2.9 01/17/2024   Lab Results  Component Value Date   VD25OH 83.3 01/17/2024   VD25OH 76.0 09/20/2023   VD25OH 64.3 06/05/2023    Lab Results  Component Value Date   WBC 8.5 10/10/2020   HGB 14.8 10/10/2020   HCT 44.8 10/10/2020   MCV 84.2 10/10/2020   PLT 274 10/10/2020   No results found for: IRON, TIBC, FERRITIN  Attestation Statements:   Reviewed by clinician on day of visit: allergies, medications, problem list, medical history, surgical history, family history, social history, and previous encounter notes.  I have reviewed the above documentation for accuracy and completeness, and I agree with the above. -  Daneisha Surges d. Ilona Colley, NP-C

## 2024-06-20 ENCOUNTER — Encounter (INDEPENDENT_AMBULATORY_CARE_PROVIDER_SITE_OTHER): Payer: Self-pay | Admitting: Adult Health

## 2024-06-20 LAB — COMPREHENSIVE METABOLIC PANEL WITH GFR
ALT: 12 IU/L (ref 0–32)
AST: 20 IU/L (ref 0–40)
Albumin: 4.3 g/dL (ref 3.8–4.8)
Alkaline Phosphatase: 75 IU/L (ref 44–121)
BUN/Creatinine Ratio: 17 (ref 12–28)
BUN: 17 mg/dL (ref 8–27)
Bilirubin Total: 0.6 mg/dL (ref 0.0–1.2)
CO2: 21 mmol/L (ref 20–29)
Calcium: 10 mg/dL (ref 8.7–10.3)
Chloride: 103 mmol/L (ref 96–106)
Creatinine, Ser: 1.01 mg/dL — ABNORMAL HIGH (ref 0.57–1.00)
Globulin, Total: 2.7 g/dL (ref 1.5–4.5)
Glucose: 89 mg/dL (ref 70–99)
Potassium: 4.6 mmol/L (ref 3.5–5.2)
Sodium: 140 mmol/L (ref 134–144)
Total Protein: 7 g/dL (ref 6.0–8.5)
eGFR: 60 mL/min/1.73 (ref >59)

## 2024-06-20 LAB — HEMOGLOBIN A1C
Est. average glucose Bld gHb Est-mCnc: 108 mg/dL
Hgb A1c MFr Bld: 5.4 % (ref 4.8–5.6)

## 2024-06-20 LAB — LIPID PANEL
Chol/HDL Ratio: 3.2 ratio (ref 0.0–4.4)
Cholesterol, Total: 177 mg/dL (ref 100–199)
HDL: 56 mg/dL (ref >39)
LDL Chol Calc (NIH): 100 mg/dL — ABNORMAL HIGH (ref 0–99)
Triglycerides: 116 mg/dL (ref 0–149)
VLDL Cholesterol Cal: 21 mg/dL (ref 5–40)

## 2024-06-20 LAB — VITAMIN B12: Vitamin B-12: 472 pg/mL (ref 232–1245)

## 2024-06-20 LAB — VITAMIN D 25 HYDROXY (VIT D DEFICIENCY, FRACTURES): Vit D, 25-Hydroxy: 57.2 ng/mL (ref 30.0–100.0)

## 2024-06-20 LAB — INSULIN, RANDOM: INSULIN: 15.1 u[IU]/mL (ref 2.6–24.9)

## 2024-07-01 ENCOUNTER — Other Ambulatory Visit: Payer: Self-pay

## 2024-07-08 ENCOUNTER — Encounter (INDEPENDENT_AMBULATORY_CARE_PROVIDER_SITE_OTHER): Admitting: Ophthalmology

## 2024-07-17 ENCOUNTER — Encounter (INDEPENDENT_AMBULATORY_CARE_PROVIDER_SITE_OTHER): Admitting: Ophthalmology

## 2024-07-28 DIAGNOSIS — N3001 Acute cystitis with hematuria: Secondary | ICD-10-CM | POA: Diagnosis not present

## 2024-07-28 DIAGNOSIS — R350 Frequency of micturition: Secondary | ICD-10-CM | POA: Diagnosis not present

## 2024-07-30 ENCOUNTER — Other Ambulatory Visit (HOSPITAL_BASED_OUTPATIENT_CLINIC_OR_DEPARTMENT_OTHER): Payer: Self-pay

## 2024-07-30 ENCOUNTER — Other Ambulatory Visit (HOSPITAL_COMMUNITY): Payer: Self-pay

## 2024-07-31 ENCOUNTER — Encounter (INDEPENDENT_AMBULATORY_CARE_PROVIDER_SITE_OTHER): Admitting: Ophthalmology

## 2024-08-02 ENCOUNTER — Other Ambulatory Visit (HOSPITAL_BASED_OUTPATIENT_CLINIC_OR_DEPARTMENT_OTHER): Payer: Self-pay

## 2024-08-02 DIAGNOSIS — Z23 Encounter for immunization: Secondary | ICD-10-CM | POA: Diagnosis not present

## 2024-08-02 MED ORDER — FLUZONE 0.5 ML IM SUSY
0.5000 mL | PREFILLED_SYRINGE | Freq: Once | INTRAMUSCULAR | 0 refills | Status: AC
Start: 2024-08-02 — End: 2024-08-03
  Filled 2024-08-02 (×2): qty 0.5, 1d supply, fill #0

## 2024-08-12 ENCOUNTER — Other Ambulatory Visit: Payer: Self-pay | Admitting: Obstetrics and Gynecology

## 2024-08-12 DIAGNOSIS — Z0000 Encounter for general adult medical examination without abnormal findings: Secondary | ICD-10-CM

## 2024-08-19 ENCOUNTER — Encounter (INDEPENDENT_AMBULATORY_CARE_PROVIDER_SITE_OTHER): Payer: Self-pay | Admitting: Adult Health

## 2024-08-19 ENCOUNTER — Other Ambulatory Visit (HOSPITAL_BASED_OUTPATIENT_CLINIC_OR_DEPARTMENT_OTHER): Payer: Self-pay

## 2024-08-19 ENCOUNTER — Other Ambulatory Visit (INDEPENDENT_AMBULATORY_CARE_PROVIDER_SITE_OTHER): Payer: Self-pay

## 2024-08-19 ENCOUNTER — Other Ambulatory Visit (INDEPENDENT_AMBULATORY_CARE_PROVIDER_SITE_OTHER): Payer: Self-pay | Admitting: Adult Health

## 2024-08-19 ENCOUNTER — Other Ambulatory Visit: Payer: Self-pay

## 2024-08-19 MED ORDER — ATORVASTATIN CALCIUM 40 MG PO TABS
40.0000 mg | ORAL_TABLET | Freq: Every day | ORAL | 1 refills | Status: AC
  Filled 2024-08-19: qty 90, 90d supply, fill #0
  Filled 2024-11-05: qty 90, 90d supply, fill #1

## 2024-08-21 ENCOUNTER — Other Ambulatory Visit (HOSPITAL_BASED_OUTPATIENT_CLINIC_OR_DEPARTMENT_OTHER): Payer: Self-pay

## 2024-09-04 ENCOUNTER — Encounter (INDEPENDENT_AMBULATORY_CARE_PROVIDER_SITE_OTHER): Payer: Self-pay | Admitting: Adult Health

## 2024-09-11 ENCOUNTER — Encounter (INDEPENDENT_AMBULATORY_CARE_PROVIDER_SITE_OTHER): Payer: Self-pay | Admitting: Adult Health

## 2024-09-11 ENCOUNTER — Ambulatory Visit (INDEPENDENT_AMBULATORY_CARE_PROVIDER_SITE_OTHER): Admitting: Adult Health

## 2024-09-11 VITALS — BP 119/79 | HR 90 | Temp 97.6°F | Ht 62.0 in | Wt 138.0 lb

## 2024-09-11 DIAGNOSIS — E559 Vitamin D deficiency, unspecified: Secondary | ICD-10-CM | POA: Diagnosis not present

## 2024-09-11 DIAGNOSIS — E66811 Obesity, class 1: Secondary | ICD-10-CM

## 2024-09-11 DIAGNOSIS — E538 Deficiency of other specified B group vitamins: Secondary | ICD-10-CM | POA: Diagnosis not present

## 2024-09-11 DIAGNOSIS — E782 Mixed hyperlipidemia: Secondary | ICD-10-CM

## 2024-09-11 DIAGNOSIS — N1831 Chronic kidney disease, stage 3a: Secondary | ICD-10-CM | POA: Diagnosis not present

## 2024-09-11 DIAGNOSIS — R7303 Prediabetes: Secondary | ICD-10-CM

## 2024-09-11 DIAGNOSIS — Z6825 Body mass index (BMI) 25.0-25.9, adult: Secondary | ICD-10-CM | POA: Diagnosis not present

## 2024-09-11 DIAGNOSIS — E669 Obesity, unspecified: Secondary | ICD-10-CM | POA: Diagnosis not present

## 2024-09-11 NOTE — Progress Notes (Signed)
 WEIGHT SUMMARY AND BIOMETRICS  Vitals Temp: 97.6 F (36.4 C) BP: 119/79 Pulse Rate: 90 SpO2: 99 %   Anthropometric Measurements Height: 5' 2 (1.575 m) Weight: 138 lb (62.6 kg) BMI (Calculated): 25.23 Weight at Last Visit: 135 lb Weight Lost Since Last Visit: 0 Weight Gained Since Last Visit: 3 lb Starting Weight: 168 lb Total Weight Loss (lbs): 30 lb (13.6 kg)   Body Composition  Body Fat %: 33.5 % Fat Mass (lbs): 46.2 lbs Muscle Mass (lbs): 87.2 lbs Total Body Water  (lbs): 59.6 lbs Visceral Fat Rating : 9   Other Clinical Data Fasting: yes Labs: no Today's Visit #: 48 Starting Date: 02/19/20    Chief Complaint:   OBESITY Anita Randolph is here to discuss her progress with her obesity treatment plan.  She is on the keeping a food journal and adhering to recommended goals of 1100 calories and 100g+ protein and states she is following her eating plan approximately 50 % of the time.  She states she is exercising Walking and Traveling 60/hours minutes 4/7 times per week.  Interim History:  STARTED MX PHASE 05/06/2024  Anita Randolph has been travelling on/off the last 3-4 weeks. Some days she achieved > 18K steps during travel events. She was also walking at elevations between 4-7K feet.  Reviewed Biompedence Results with pt: Muscle Mass: +1.4 lbs Adipose Mass: +0.8 lb  Started Wegovy  0.25mg  on 07/28/2023 10/19/2023 increased Wegovy  from 0.25mg  to 0.5mg  She started spacing out injections 7-10 days since end of April 2025  She is tolerating GLP-1 therapy w/o SE  Subjective:   1. Prediabetes Lab Results  Component Value Date   HGBA1C 5.4 06/19/2024   HGBA1C 5.4 01/17/2024   HGBA1C 5.8 (H) 09/20/2023     Latest Reference Range & Units 06/05/23 09:48 01/17/24 11:18 06/19/24 09:42  INSULIN  2.6 - 24.9 uIU/mL 10.4 7.1 15.1   Started Wegovy  0.25mg  on 07/28/2023 10/19/2023 increased Wegovy  from 0.25mg  to 0.5mg  She started spacing out injections 7-10 days since  end of April 2025  She is tolerating GLP-1 therapy w/o SE  2. Mixed hyperlipidemia Lipid Panel     Component Value Date/Time   CHOL 177 06/19/2024 0942   TRIG 116 06/19/2024 0942   HDL 56 06/19/2024 0942   CHOLHDL 3.2 06/19/2024 0942   LDLCALC 100 (H) 06/19/2024 0942   LABVLDL 21 06/19/2024 0942    She is on daily Liptior 40mg  and wegovy  0.5mg  Q10D She denies tobacco/vape use She denies CP with exertion  3. Stage 3a chronic kidney disease (HCC)  Latest Reference Range & Units 09/20/23 10:06 01/17/24 11:18 06/19/24 09:42  eGFR >59 mL/min/1.73 56 (L) 56 (L) 60  (L): Data is abnormally low  She reports limited water  intake the last several weeks. She is concerned that this can be determinantal to her kidney fx. Her BP is well controlled at OV, has been historically. Will check CMP today with fasting labs.  4. B12 nutritional deficiency She endorses stable energy levels.  5. Vitamin D  deficiency She endorses stable energy levels.  Assessment/Plan:   1. Prediabetes (Primary) Check Labs - Hemoglobin A1c - Insulin , random  2. Mixed hyperlipidemia Check Labs - Lipid panel  3. Stage 3a chronic kidney disease (HCC) Check Labs - Comprehensive metabolic panel with GFR  4. B12 nutritional deficiency Check Labs - Vitamin B12  5. Vitamin D  deficiency Check Labs - VITAMIN D  25 Hydroxy (Vit-D Deficiency, Fractures)  6. Obesity, current BMI 25.2 Continue to administer Wegovy  0.5mg   Q10D  Anita Randolph is currently in the action stage of change. As such, her goal is to maintain weight for now. She has agreed to keeping a food journal and adhering to recommended goals of 1100 calories and 100g+ protein.   Exercise goals: Older adults should follow the adult guidelines. When older adults cannot meet the adult guidelines, they should be as physically active as their abilities and conditions will allow.  Older adults should do exercises that maintain or improve balance if they are at  risk of falling.  Older adults should determine their level of effort for physical activity relative to their level of fitness.  Older adults with chronic conditions should understand whether and how their conditions affect their ability to do regular physical activity safely.  Behavioral modification strategies: increasing lean protein intake, decreasing simple carbohydrates, increasing vegetables, increasing water  intake, no skipping meals, meal planning and cooking strategies, keeping healthy foods in the home, ways to avoid boredom eating, and planning for success.  Anita Randolph has agreed to follow-up with our clinic in 6 weeks. She was informed of the importance of frequent follow-up visits to maximize her success with intensive lifestyle modifications for her multiple health conditions.   Anita Randolph was informed we would discuss her lab results at her next visit unless there is a critical issue that needs to be addressed sooner. Anita Randolph agreed to keep her next visit at the agreed upon time to discuss these results.  Objective:   Blood pressure 119/79, pulse 90, temperature 97.6 F (36.4 C), height 5' 2 (1.575 m), weight 138 lb (62.6 kg), last menstrual period 11/28/2004, SpO2 99%. Body mass index is 25.24 kg/m.  General: Cooperative, alert, well developed, in no acute distress. HEENT: Conjunctivae and lids unremarkable. Cardiovascular: Regular rhythm.  Lungs: Normal work of breathing. Neurologic: No focal deficits.   Lab Results  Component Value Date   CREATININE 1.01 (H) 06/19/2024   BUN 17 06/19/2024   NA 140 06/19/2024   K 4.6 06/19/2024   CL 103 06/19/2024   CO2 21 06/19/2024   Lab Results  Component Value Date   ALT 12 06/19/2024   AST 20 06/19/2024   ALKPHOS 75 06/19/2024   BILITOT 0.6 06/19/2024   Lab Results  Component Value Date   HGBA1C 5.4 06/19/2024   HGBA1C 5.4 01/17/2024   HGBA1C 5.8 (H) 09/20/2023   HGBA1C 5.8 (H) 06/05/2023   HGBA1C 5.9 (H) 01/04/2023   Lab  Results  Component Value Date   INSULIN  15.1 06/19/2024   INSULIN  7.1 01/17/2024   INSULIN  10.4 06/05/2023   INSULIN  11.0 01/04/2023   INSULIN  11.0 09/21/2022   Lab Results  Component Value Date   TSH 2.030 06/05/2023   Lab Results  Component Value Date   CHOL 177 06/19/2024   HDL 56 06/19/2024   LDLCALC 100 (H) 06/19/2024   TRIG 116 06/19/2024   CHOLHDL 3.2 06/19/2024   Lab Results  Component Value Date   VD25OH 57.2 06/19/2024   VD25OH 83.3 01/17/2024   VD25OH 76.0 09/20/2023   Lab Results  Component Value Date   WBC 8.5 10/10/2020   HGB 14.8 10/10/2020   HCT 44.8 10/10/2020   MCV 84.2 10/10/2020   PLT 274 10/10/2020   No results found for: IRON, TIBC, FERRITIN  Attestation Statements:   Reviewed by clinician on day of visit: allergies, medications, problem list, medical history, surgical history, family history, social history, and previous encounter notes.  I have reviewed the above documentation for accuracy and completeness,  and I agree with the above. -  Alishia Lebo d. Jahdai Padovano, NP-C

## 2024-09-12 ENCOUNTER — Encounter (INDEPENDENT_AMBULATORY_CARE_PROVIDER_SITE_OTHER): Payer: Self-pay | Admitting: Adult Health

## 2024-09-12 DIAGNOSIS — Z6826 Body mass index (BMI) 26.0-26.9, adult: Secondary | ICD-10-CM | POA: Diagnosis not present

## 2024-09-12 DIAGNOSIS — Z01419 Encounter for gynecological examination (general) (routine) without abnormal findings: Secondary | ICD-10-CM | POA: Diagnosis not present

## 2024-09-12 LAB — COMPREHENSIVE METABOLIC PANEL WITH GFR
ALT: 15 IU/L (ref 0–32)
AST: 24 IU/L (ref 0–40)
Albumin: 4.4 g/dL (ref 3.8–4.8)
Alkaline Phosphatase: 77 IU/L (ref 49–135)
BUN/Creatinine Ratio: 16 (ref 12–28)
BUN: 19 mg/dL (ref 8–27)
Bilirubin Total: 0.5 mg/dL (ref 0.0–1.2)
CO2: 22 mmol/L (ref 20–29)
Calcium: 10.1 mg/dL (ref 8.7–10.3)
Chloride: 102 mmol/L (ref 96–106)
Creatinine, Ser: 1.22 mg/dL — ABNORMAL HIGH (ref 0.57–1.00)
Globulin, Total: 2.7 g/dL (ref 1.5–4.5)
Glucose: 81 mg/dL (ref 70–99)
Potassium: 4.1 mmol/L (ref 3.5–5.2)
Sodium: 139 mmol/L (ref 134–144)
Total Protein: 7.1 g/dL (ref 6.0–8.5)
eGFR: 47 mL/min/1.73 — ABNORMAL LOW (ref >59)

## 2024-09-12 LAB — VITAMIN B12: Vitamin B-12: 458 pg/mL (ref 232–1245)

## 2024-09-12 LAB — LIPID PANEL
Chol/HDL Ratio: 2.9 ratio (ref 0.0–4.4)
Cholesterol, Total: 185 mg/dL (ref 100–199)
HDL: 64 mg/dL (ref >39)
LDL Chol Calc (NIH): 103 mg/dL — ABNORMAL HIGH (ref 0–99)
Triglycerides: 100 mg/dL (ref 0–149)
VLDL Cholesterol Cal: 18 mg/dL (ref 5–40)

## 2024-09-12 LAB — HEMOGLOBIN A1C
Est. average glucose Bld gHb Est-mCnc: 111 mg/dL
Hgb A1c MFr Bld: 5.5 % (ref 4.8–5.6)

## 2024-09-12 LAB — VITAMIN D 25 HYDROXY (VIT D DEFICIENCY, FRACTURES): Vit D, 25-Hydroxy: 56.4 ng/mL (ref 30.0–100.0)

## 2024-09-12 LAB — INSULIN, RANDOM: INSULIN: 8.1 u[IU]/mL (ref 2.6–24.9)

## 2024-09-17 ENCOUNTER — Other Ambulatory Visit (HOSPITAL_BASED_OUTPATIENT_CLINIC_OR_DEPARTMENT_OTHER): Payer: Self-pay

## 2024-09-17 DIAGNOSIS — E559 Vitamin D deficiency, unspecified: Secondary | ICD-10-CM | POA: Diagnosis not present

## 2024-09-17 DIAGNOSIS — N390 Urinary tract infection, site not specified: Secondary | ICD-10-CM | POA: Diagnosis not present

## 2024-09-17 DIAGNOSIS — Z23 Encounter for immunization: Secondary | ICD-10-CM | POA: Diagnosis not present

## 2024-09-17 DIAGNOSIS — E7800 Pure hypercholesterolemia, unspecified: Secondary | ICD-10-CM | POA: Diagnosis not present

## 2024-09-17 DIAGNOSIS — R7303 Prediabetes: Secondary | ICD-10-CM | POA: Diagnosis not present

## 2024-09-17 DIAGNOSIS — Z853 Personal history of malignant neoplasm of breast: Secondary | ICD-10-CM | POA: Diagnosis not present

## 2024-09-17 DIAGNOSIS — Z0000 Encounter for general adult medical examination without abnormal findings: Secondary | ICD-10-CM | POA: Diagnosis not present

## 2024-09-17 DIAGNOSIS — R944 Abnormal results of kidney function studies: Secondary | ICD-10-CM | POA: Diagnosis not present

## 2024-09-17 DIAGNOSIS — Z79899 Other long term (current) drug therapy: Secondary | ICD-10-CM | POA: Diagnosis not present

## 2024-09-17 DIAGNOSIS — I10 Essential (primary) hypertension: Secondary | ICD-10-CM | POA: Diagnosis not present

## 2024-09-17 MED ORDER — CEPHALEXIN 500 MG PO CAPS
500.0000 mg | ORAL_CAPSULE | Freq: Four times a day (QID) | ORAL | 0 refills | Status: AC
  Filled 2024-09-17: qty 20, 5d supply, fill #0

## 2024-09-19 ENCOUNTER — Encounter (INDEPENDENT_AMBULATORY_CARE_PROVIDER_SITE_OTHER): Admitting: Ophthalmology

## 2024-09-19 DIAGNOSIS — H43812 Vitreous degeneration, left eye: Secondary | ICD-10-CM | POA: Diagnosis not present

## 2024-09-19 DIAGNOSIS — D3131 Benign neoplasm of right choroid: Secondary | ICD-10-CM

## 2024-09-19 DIAGNOSIS — H338 Other retinal detachments: Secondary | ICD-10-CM

## 2024-09-25 ENCOUNTER — Ambulatory Visit
Admission: RE | Admit: 2024-09-25 | Discharge: 2024-09-25 | Disposition: A | Discharge status: Home / Self Care | Source: Ambulatory Visit | Attending: Obstetrics and Gynecology | Admitting: Obstetrics and Gynecology

## 2024-09-25 DIAGNOSIS — Z0000 Encounter for general adult medical examination without abnormal findings: Secondary | ICD-10-CM

## 2024-09-25 DIAGNOSIS — Z1231 Encounter for screening mammogram for malignant neoplasm of breast: Secondary | ICD-10-CM | POA: Diagnosis not present

## 2024-10-16 DIAGNOSIS — Z860101 Personal history of adenomatous and serrated colon polyps: Secondary | ICD-10-CM | POA: Diagnosis not present

## 2024-10-16 DIAGNOSIS — K580 Irritable bowel syndrome with diarrhea: Secondary | ICD-10-CM | POA: Diagnosis not present

## 2024-10-16 DIAGNOSIS — K5790 Diverticulosis of intestine, part unspecified, without perforation or abscess without bleeding: Secondary | ICD-10-CM | POA: Diagnosis not present

## 2024-10-17 DIAGNOSIS — N390 Urinary tract infection, site not specified: Secondary | ICD-10-CM | POA: Diagnosis not present

## 2024-10-17 DIAGNOSIS — R350 Frequency of micturition: Secondary | ICD-10-CM | POA: Diagnosis not present

## 2024-10-17 DIAGNOSIS — R319 Hematuria, unspecified: Secondary | ICD-10-CM | POA: Diagnosis not present

## 2024-11-06 ENCOUNTER — Encounter (INDEPENDENT_AMBULATORY_CARE_PROVIDER_SITE_OTHER): Payer: Self-pay | Admitting: Adult Health

## 2024-11-06 ENCOUNTER — Ambulatory Visit (INDEPENDENT_AMBULATORY_CARE_PROVIDER_SITE_OTHER): Payer: Self-pay | Admitting: Adult Health

## 2024-11-06 VITALS — BP 133/76 | HR 64 | Temp 97.5°F | Ht 62.0 in | Wt 137.0 lb

## 2024-11-06 DIAGNOSIS — Z6825 Body mass index (BMI) 25.0-25.9, adult: Secondary | ICD-10-CM

## 2024-11-06 DIAGNOSIS — N1831 Chronic kidney disease, stage 3a: Secondary | ICD-10-CM

## 2024-11-06 DIAGNOSIS — E669 Obesity, unspecified: Secondary | ICD-10-CM | POA: Diagnosis not present

## 2024-11-06 DIAGNOSIS — E538 Deficiency of other specified B group vitamins: Secondary | ICD-10-CM | POA: Diagnosis not present

## 2024-11-06 DIAGNOSIS — E782 Mixed hyperlipidemia: Secondary | ICD-10-CM | POA: Diagnosis not present

## 2024-11-06 DIAGNOSIS — R7303 Prediabetes: Secondary | ICD-10-CM | POA: Diagnosis not present

## 2024-11-06 DIAGNOSIS — E559 Vitamin D deficiency, unspecified: Secondary | ICD-10-CM | POA: Diagnosis not present

## 2024-11-06 DIAGNOSIS — E66811 Obesity, class 1: Secondary | ICD-10-CM

## 2024-11-06 NOTE — Progress Notes (Signed)
 WEIGHT SUMMARY AND BIOMETRICS  Vitals Temp: (!) 97.5 F (36.4 C) BP: 133/76 Pulse Rate: 64 SpO2: 99 %   Anthropometric Measurements Height: 5' 2 (1.575 m) Weight: 137 lb (62.1 kg) BMI (Calculated): 25.05 Weight at Last Visit: 138 lb Weight Lost Since Last Visit: 1 lb Weight Gained Since Last Visit: 0 Starting Weight: 168 lb Total Weight Loss (lbs): 31 lb (14.1 kg)   Body Composition  Body Fat %: 33.2 % Fat Mass (lbs): 45.6 lbs Muscle Mass (lbs): 87.4 lbs Total Body Water  (lbs): 60.4 lbs Visceral Fat Rating : 9   Other Clinical Data Fasting: yes Labs: no Today's Visit #: 72 Starting Date: 02/19/20    Chief Complaint:   OBESITY Anita Randolph is here to discuss her progress with her obesity treatment plan.  She is on the keeping a food journal and adhering to recommended goals of 1100 calories and 90g+ protein and states she is following her eating plan approximately 50 % of the time.  She states she is exercising Systems Analyst weekly, Gym Exercise 45 minutes 3 times per week.  Interim History:  STARTED MX PHASE 05/06/2024   09/11/2024 HWW OV- fasting labs completed  09/18/2024 labs completed with PCP- reviewed in [facility]  10/16/2024 chronic f/u with established Gastroenterology  10/17/2024 UC OV for UTI  Hunger/appetite-stable appetite  Cravings- none  Stress- life is stable  Exercise-she has added in working with systems analyst once week- every other week, The trainer has been safely encouraging her to increase exercise intensity.  She would like to loss 3-4 more lbs in time for her son's wedding Feb 2026  She has been administering Wegovy  0.5mg  every 8 days Denies mass in neck, dysphagia, dyspepsia, persistent hoarseness, abdominal pain, or N/V/C    11/06/24 09:00  Weight 137 lb (62.1 kg)  BMI (Calculated) 25.05  Total Weight Loss (lbs) 31 lb (14.1 kg)   Subjective:   1. Stage 3a chronic kidney disease (HCC) Discussed Labs   Latest Reference Range & Units 09/11/24 08:49  Creatinine 0.57 - 1.00 mg/dL 8.77 (H)  Calcium  8.7 - 10.3 mg/dL 89.8  BUN/Creatinine Ratio 12 - 28  16  eGFR >59 mL/min/1.73 47 (L)  (H): Data is abnormally high (L): Data is abnormally low  09/18/2024 [facility] Creatinine 1.00 0.60-1.30 mg/dl    [id number] 60 >39 calc    Kidney fx improved! BP stable at OV She is on  2. Prediabetes Discussed Labs  Latest Reference Range & Units 09/11/24 08:49  Glucose 70 - 99 mg/dL 81  Hemoglobin J8R 4.8 - 5.6 % 5.5  Est. average glucose Bld gHb Est-mCnc mg/dL 888  INSULIN  2.6 - 24.9 uIU/mL 8.1   CBG, A1c at goal Insulin  level improved and only slightly above goal of 5 She has been administering Wegovy  0.5mg  every 8 days Denies mass in neck, dysphagia, dyspepsia, persistent hoarseness, abdominal pain, or N/V/C   3. B12 nutritional deficiency Discussed Labs  Latest Reference Range & Units 09/11/24 08:49  Vitamin B12 232 - 1,245 pg/mL 458   B12 level stable  4. Vitamin D  deficiency Discussed Labs  Latest Reference Range & Units 09/11/24 08:49  Vitamin D , 25-Hydroxy 30.0 - 100.0 ng/mL 56.4   Vit D level stable  5. Mixed hyperlipidemia Discussed Labs Lipid Panel     Component Value Date/Time   CHOL 185 09/11/2024 0849   TRIG 100 09/11/2024 0849   HDL 64 09/11/2024 0849   CHOLHDL 2.9 09/11/2024 0849  LDLCALC 103 (H) 09/11/2024 0849   LABVLDL 18 09/11/2024 0849    Lipid panel stable She is on daily Lipitor 40mg  and Wegovy  0.5mg  every 8 days Denies mass in neck, dysphagia, dyspepsia, persistent hoarseness, abdominal pain, or N/V/C   Assessment/Plan:   1. Stage 3a chronic kidney disease (HCC) Remain well hydrated with water  Avoid Nephrotoxic substances Monitor Labs  2. Prediabetes (Primary) Continue healthy eating and regular exercise Increase Wegovy  0.5mg  to every 7 days.  3. B12 nutritional deficiency Monitor Labs  4. Vitamin D  deficiency Monitor Labs  5. Mixed  hyperlipidemia Continue healthy eating and regular exercise Increase Wegovy  0.5mg  to every 7 days.  6. Obesity, current BMI 25.2 Continue healthy eating and regular exercise Increase Wegovy  0.5mg  to every 7 days.  Anita Randolph is currently in the action stage of change. As such, her goal is to maintain weight for now. She has agreed to keeping a food journal and adhering to recommended goals of 1100 calories and 90g+ protein.   Exercise goals: Older adults should follow the adult guidelines. When older adults cannot meet the adult guidelines, they should be as physically active as their abilities and conditions will allow.  Older adults should do exercises that maintain or improve balance if they are at risk of falling.  Older adults should determine their level of effort for physical activity relative to their level of fitness.  Older adults with chronic conditions should understand whether and how their conditions affect their ability to do regular physical activity safely.  Behavioral modification strategies: increasing lean protein intake, decreasing simple carbohydrates, increasing vegetables, increasing water  intake, no skipping meals, meal planning and cooking strategies, keeping healthy foods in the home, ways to avoid boredom eating, travel eating strategies, holiday eating strategies , and planning for success.  Anita Randolph has agreed to follow-up with our clinic in 8 weeks. She was informed of the importance of frequent follow-up visits to maximize her success with intensive lifestyle modifications for her multiple health conditions.   Check Fasting Labs Early 2026  Objective:   Blood pressure 133/76, pulse 64, temperature (!) 97.5 F (36.4 C), height 5' 2 (1.575 m), weight 137 lb (62.1 kg), last menstrual period 11/28/2004, SpO2 99%. Body mass index is 25.06 kg/m.  General: Cooperative, alert, well developed, in no acute distress. HEENT: Conjunctivae and lids unremarkable. Cardiovascular:  Regular rhythm.  Lungs: Normal work of breathing. Neurologic: No focal deficits.   Lab Results  Component Value Date   CREATININE 1.22 (H) 09/11/2024   BUN 19 09/11/2024   NA 139 09/11/2024   K 4.1 09/11/2024   CL 102 09/11/2024   CO2 22 09/11/2024   Lab Results  Component Value Date   ALT 15 09/11/2024   AST 24 09/11/2024   ALKPHOS 77 09/11/2024   BILITOT 0.5 09/11/2024   Lab Results  Component Value Date   HGBA1C 5.5 09/11/2024   HGBA1C 5.4 06/19/2024   HGBA1C 5.4 01/17/2024   HGBA1C 5.8 (H) 09/20/2023   HGBA1C 5.8 (H) 06/05/2023   Lab Results  Component Value Date   INSULIN  8.1 09/11/2024   INSULIN  15.1 06/19/2024   INSULIN  7.1 01/17/2024   INSULIN  10.4 06/05/2023   INSULIN  11.0 01/04/2023   Lab Results  Component Value Date   TSH 2.030 06/05/2023   Lab Results  Component Value Date   CHOL 185 09/11/2024   HDL 64 09/11/2024   LDLCALC 103 (H) 09/11/2024   TRIG 100 09/11/2024   CHOLHDL 2.9 09/11/2024   Lab Results  Component Value Date   VD25OH 56.4 09/11/2024   VD25OH 57.2 06/19/2024   VD25OH 83.3 01/17/2024   Lab Results  Component Value Date   WBC 8.5 10/10/2020   HGB 14.8 10/10/2020   HCT 44.8 10/10/2020   MCV 84.2 10/10/2020   PLT 274 10/10/2020   No results found for: IRON, TIBC, FERRITIN  Attestation Statements:   Reviewed by clinician on day of visit: allergies, medications, problem list, medical history, surgical history, family history, social history, and previous encounter notes.  I have reviewed the above documentation for accuracy and completeness, and I agree with the above. -  Enora Trillo d. Torres H[place], NP-C

## 2025-01-01 ENCOUNTER — Ambulatory Visit (INDEPENDENT_AMBULATORY_CARE_PROVIDER_SITE_OTHER): Admitting: Adult Health

## 2025-01-08 ENCOUNTER — Ambulatory Visit (INDEPENDENT_AMBULATORY_CARE_PROVIDER_SITE_OTHER): Admitting: Adult Health

## 2025-09-25 ENCOUNTER — Encounter (INDEPENDENT_AMBULATORY_CARE_PROVIDER_SITE_OTHER): Admitting: Ophthalmology
